# Patient Record
Sex: Female | Born: 1955 | Race: Black or African American | Hispanic: No | Smoking: Current every day smoker
Health system: Southern US, Community
[De-identification: ages and names within clinical notes are randomized; demographics above are authoritative.]

## PROBLEM LIST (undated history)

## (undated) DIAGNOSIS — I2510 Atherosclerotic heart disease of native coronary artery without angina pectoris: Secondary | ICD-10-CM

## (undated) DIAGNOSIS — E162 Hypoglycemia, unspecified: Secondary | ICD-10-CM

## (undated) DIAGNOSIS — F419 Anxiety disorder, unspecified: Secondary | ICD-10-CM

## (undated) DIAGNOSIS — F329 Major depressive disorder, single episode, unspecified: Secondary | ICD-10-CM

## (undated) DIAGNOSIS — K219 Gastro-esophageal reflux disease without esophagitis: Secondary | ICD-10-CM

## (undated) DIAGNOSIS — I10 Essential (primary) hypertension: Secondary | ICD-10-CM

## (undated) DIAGNOSIS — I739 Peripheral vascular disease, unspecified: Secondary | ICD-10-CM

## (undated) DIAGNOSIS — IMO0002 Reserved for concepts with insufficient information to code with codable children: Secondary | ICD-10-CM

## (undated) DIAGNOSIS — I639 Cerebral infarction, unspecified: Secondary | ICD-10-CM

## (undated) DIAGNOSIS — F32A Depression, unspecified: Secondary | ICD-10-CM

## (undated) HISTORY — PX: TUBAL LIGATION: SHX77

## (undated) HISTORY — PX: KNEE SURGERY: SHX244

---

## 2001-10-25 ENCOUNTER — Emergency Department (HOSPITAL_COMMUNITY): Admission: EM | Admit: 2001-10-25 | Discharge: 2001-10-26 | Payer: Self-pay | Admitting: Emergency Medicine

## 2001-10-25 ENCOUNTER — Encounter: Payer: Self-pay | Admitting: Emergency Medicine

## 2002-06-30 ENCOUNTER — Emergency Department (HOSPITAL_COMMUNITY): Admission: EM | Admit: 2002-06-30 | Discharge: 2002-07-01 | Payer: Self-pay | Admitting: Emergency Medicine

## 2003-01-20 ENCOUNTER — Emergency Department (HOSPITAL_COMMUNITY): Admission: AD | Admit: 2003-01-20 | Discharge: 2003-01-20 | Payer: Self-pay | Admitting: Emergency Medicine

## 2004-06-30 ENCOUNTER — Emergency Department (HOSPITAL_COMMUNITY): Admission: EM | Admit: 2004-06-30 | Discharge: 2004-07-01 | Payer: Self-pay | Admitting: Emergency Medicine

## 2004-11-10 ENCOUNTER — Emergency Department (HOSPITAL_COMMUNITY): Admission: EM | Admit: 2004-11-10 | Discharge: 2004-11-11 | Payer: Self-pay | Admitting: Emergency Medicine

## 2005-03-18 ENCOUNTER — Emergency Department (HOSPITAL_COMMUNITY): Admission: EM | Admit: 2005-03-18 | Discharge: 2005-03-18 | Payer: Self-pay | Admitting: Emergency Medicine

## 2005-04-25 ENCOUNTER — Ambulatory Visit: Payer: Self-pay | Admitting: Nurse Practitioner

## 2005-04-29 ENCOUNTER — Ambulatory Visit: Payer: Self-pay | Admitting: *Deleted

## 2005-05-02 ENCOUNTER — Ambulatory Visit (HOSPITAL_COMMUNITY): Admission: RE | Admit: 2005-05-02 | Discharge: 2005-05-02 | Payer: Self-pay | Admitting: Internal Medicine

## 2005-05-14 ENCOUNTER — Ambulatory Visit (HOSPITAL_COMMUNITY): Admission: RE | Admit: 2005-05-14 | Discharge: 2005-05-14 | Payer: Self-pay | Admitting: Internal Medicine

## 2005-06-30 ENCOUNTER — Ambulatory Visit: Payer: Self-pay | Admitting: Nurse Practitioner

## 2005-07-01 ENCOUNTER — Ambulatory Visit: Payer: Self-pay | Admitting: Nurse Practitioner

## 2005-09-30 ENCOUNTER — Ambulatory Visit: Payer: Self-pay | Admitting: Nurse Practitioner

## 2006-05-20 ENCOUNTER — Ambulatory Visit: Payer: Self-pay | Admitting: Nurse Practitioner

## 2006-06-17 ENCOUNTER — Emergency Department (HOSPITAL_COMMUNITY): Admission: EM | Admit: 2006-06-17 | Discharge: 2006-06-17 | Payer: Self-pay | Admitting: Emergency Medicine

## 2006-07-06 ENCOUNTER — Emergency Department (HOSPITAL_COMMUNITY): Admission: EM | Admit: 2006-07-06 | Discharge: 2006-07-06 | Payer: Self-pay | Admitting: *Deleted

## 2006-12-30 ENCOUNTER — Encounter (INDEPENDENT_AMBULATORY_CARE_PROVIDER_SITE_OTHER): Payer: Self-pay | Admitting: *Deleted

## 2007-06-01 ENCOUNTER — Ambulatory Visit: Payer: Self-pay | Admitting: Internal Medicine

## 2007-06-01 ENCOUNTER — Encounter (INDEPENDENT_AMBULATORY_CARE_PROVIDER_SITE_OTHER): Payer: Self-pay | Admitting: Nurse Practitioner

## 2007-06-01 LAB — CONVERTED CEMR LAB
AST: 12 units/L (ref 0–37)
Albumin: 4.2 g/dL (ref 3.5–5.2)
Alkaline Phosphatase: 133 units/L — ABNORMAL HIGH (ref 39–117)
BUN: 16 mg/dL (ref 6–23)
Basophils Relative: 1 % (ref 0–1)
Chloride: 111 meq/L (ref 96–112)
Eosinophils Absolute: 0.3 10*3/uL (ref 0.0–0.7)
Eosinophils Relative: 4 % (ref 0–5)
HCT: 44.3 % (ref 36.0–46.0)
Lymphocytes Relative: 52 % — ABNORMAL HIGH (ref 12–46)
Lymphs Abs: 3.5 10*3/uL (ref 0.7–4.0)
MCHC: 32.3 g/dL (ref 30.0–36.0)
MCV: 88.8 fL (ref 78.0–100.0)
Platelets: 320 10*3/uL (ref 150–400)
RDW: 14.4 % (ref 11.5–15.5)
Sodium: 144 meq/L (ref 135–145)
TSH: 1.333 microintl units/mL (ref 0.350–5.50)
Total Protein: 6.8 g/dL (ref 6.0–8.3)

## 2007-10-28 ENCOUNTER — Emergency Department (HOSPITAL_COMMUNITY): Admission: EM | Admit: 2007-10-28 | Discharge: 2007-10-29 | Payer: Self-pay | Admitting: Emergency Medicine

## 2007-10-29 ENCOUNTER — Inpatient Hospital Stay (HOSPITAL_COMMUNITY): Admission: AD | Admit: 2007-10-29 | Discharge: 2007-11-08 | Payer: Self-pay | Admitting: Psychiatry

## 2007-10-29 ENCOUNTER — Ambulatory Visit: Payer: Self-pay | Admitting: Psychiatry

## 2007-10-29 ENCOUNTER — Emergency Department (HOSPITAL_COMMUNITY): Admission: EM | Admit: 2007-10-29 | Discharge: 2007-10-29 | Payer: Self-pay | Admitting: Emergency Medicine

## 2007-11-12 ENCOUNTER — Encounter (INDEPENDENT_AMBULATORY_CARE_PROVIDER_SITE_OTHER): Payer: Self-pay | Admitting: Family Medicine

## 2007-11-12 ENCOUNTER — Ambulatory Visit: Payer: Self-pay | Admitting: Internal Medicine

## 2007-11-12 LAB — CONVERTED CEMR LAB
Calcium: 8.9 mg/dL (ref 8.4–10.5)
Free T4: 0.97 ng/dL (ref 0.89–1.80)
Potassium: 3.5 meq/L (ref 3.5–5.3)
Vit D, 1,25-Dihydroxy: 10 — ABNORMAL LOW (ref 30–89)

## 2007-11-16 ENCOUNTER — Ambulatory Visit (HOSPITAL_COMMUNITY): Admission: RE | Admit: 2007-11-16 | Discharge: 2007-11-16 | Payer: Self-pay | Admitting: Family Medicine

## 2008-01-04 ENCOUNTER — Ambulatory Visit: Payer: Self-pay | Admitting: Family Medicine

## 2008-01-04 LAB — CONVERTED CEMR LAB
Barbiturate Quant, Ur: NEGATIVE
Cocaine Metabolites: NEGATIVE
Marijuana Metabolite: NEGATIVE
Phencyclidine (PCP): NEGATIVE
Propoxyphene: NEGATIVE

## 2008-01-06 ENCOUNTER — Ambulatory Visit (HOSPITAL_COMMUNITY): Admission: RE | Admit: 2008-01-06 | Discharge: 2008-01-06 | Payer: Self-pay | Admitting: Family Medicine

## 2008-04-10 ENCOUNTER — Ambulatory Visit: Payer: Self-pay | Admitting: Internal Medicine

## 2008-04-10 LAB — CONVERTED CEMR LAB
BUN: 11 mg/dL
CO2: 22 meq/L
Calcium: 8.9 mg/dL
Chloride: 94 meq/L — ABNORMAL LOW
Creatinine, Ser: 0.98 mg/dL
Glucose, Bld: 534 mg/dL
Potassium: 4.1 meq/L
Sodium: 136 meq/L

## 2008-04-11 ENCOUNTER — Ambulatory Visit: Payer: Self-pay | Admitting: Family Medicine

## 2008-04-12 ENCOUNTER — Ambulatory Visit (HOSPITAL_COMMUNITY): Admission: RE | Admit: 2008-04-12 | Discharge: 2008-04-12 | Payer: Self-pay | Admitting: Internal Medicine

## 2008-04-13 ENCOUNTER — Emergency Department (HOSPITAL_COMMUNITY): Admission: EM | Admit: 2008-04-13 | Discharge: 2008-04-13 | Payer: Self-pay | Admitting: Family Medicine

## 2008-05-02 ENCOUNTER — Ambulatory Visit: Payer: Self-pay | Admitting: Family Medicine

## 2008-05-03 ENCOUNTER — Ambulatory Visit: Payer: Self-pay | Admitting: Family Medicine

## 2008-06-06 ENCOUNTER — Ambulatory Visit: Payer: Self-pay | Admitting: Family Medicine

## 2008-09-07 ENCOUNTER — Ambulatory Visit: Payer: Self-pay | Admitting: Family Medicine

## 2008-11-30 ENCOUNTER — Ambulatory Visit: Payer: Self-pay | Admitting: Family Medicine

## 2009-01-16 ENCOUNTER — Ambulatory Visit (HOSPITAL_COMMUNITY): Admission: RE | Admit: 2009-01-16 | Discharge: 2009-01-16 | Payer: Self-pay | Admitting: Family Medicine

## 2009-02-07 ENCOUNTER — Telehealth (INDEPENDENT_AMBULATORY_CARE_PROVIDER_SITE_OTHER): Payer: Self-pay | Admitting: *Deleted

## 2009-02-13 ENCOUNTER — Ambulatory Visit: Payer: Self-pay | Admitting: Family Medicine

## 2009-02-20 ENCOUNTER — Ambulatory Visit: Payer: Self-pay | Admitting: Internal Medicine

## 2009-05-08 ENCOUNTER — Ambulatory Visit: Payer: Self-pay | Admitting: Family Medicine

## 2009-05-08 LAB — CONVERTED CEMR LAB
ALT: 20 units/L (ref 0–35)
Albumin: 3.8 g/dL (ref 3.5–5.2)
Alkaline Phosphatase: 114 units/L (ref 39–117)
CO2: 27 meq/L (ref 19–32)
Calcium: 9.4 mg/dL (ref 8.4–10.5)
Chloride: 104 meq/L (ref 96–112)
Eosinophils Relative: 2 % (ref 0–5)
HCT: 42.7 % (ref 36.0–46.0)
Hemoglobin: 13.5 g/dL (ref 12.0–15.0)
Lymphocytes Relative: 52 % — ABNORMAL HIGH (ref 12–46)
Lymphs Abs: 4.3 10*3/uL — ABNORMAL HIGH (ref 0.7–4.0)
MCV: 89.3 fL (ref 78.0–100.0)
Monocytes Relative: 7 % (ref 3–12)
Neutro Abs: 3.3 10*3/uL (ref 1.7–7.7)
Platelets: 320 10*3/uL (ref 150–400)
Potassium: 3.7 meq/L (ref 3.5–5.3)
RDW: 13.1 % (ref 11.5–15.5)
Total Bilirubin: 0.3 mg/dL (ref 0.3–1.2)
Total Protein: 5.9 g/dL — ABNORMAL LOW (ref 6.0–8.3)

## 2009-05-22 ENCOUNTER — Ambulatory Visit: Payer: Self-pay | Admitting: Family Medicine

## 2009-05-28 ENCOUNTER — Ambulatory Visit (HOSPITAL_COMMUNITY): Admission: RE | Admit: 2009-05-28 | Discharge: 2009-05-28 | Payer: Self-pay | Admitting: Family Medicine

## 2009-06-05 ENCOUNTER — Ambulatory Visit: Payer: Self-pay | Admitting: Family Medicine

## 2009-08-21 ENCOUNTER — Emergency Department (HOSPITAL_COMMUNITY): Admission: EM | Admit: 2009-08-21 | Discharge: 2009-08-21 | Payer: Self-pay | Admitting: Emergency Medicine

## 2009-09-04 ENCOUNTER — Ambulatory Visit: Payer: Self-pay | Admitting: Family Medicine

## 2009-09-18 ENCOUNTER — Ambulatory Visit: Payer: Self-pay | Admitting: Family Medicine

## 2009-12-12 ENCOUNTER — Other Ambulatory Visit: Admission: RE | Admit: 2009-12-12 | Discharge: 2009-12-12 | Payer: Self-pay | Admitting: Obstetrics & Gynecology

## 2009-12-12 ENCOUNTER — Ambulatory Visit: Payer: Self-pay | Admitting: Obstetrics and Gynecology

## 2010-05-05 ENCOUNTER — Encounter: Payer: Self-pay | Admitting: Internal Medicine

## 2010-05-21 ENCOUNTER — Encounter (INDEPENDENT_AMBULATORY_CARE_PROVIDER_SITE_OTHER): Payer: Self-pay | Admitting: Family Medicine

## 2010-05-21 LAB — CONVERTED CEMR LAB
AST: 12 units/L (ref 0–37)
Albumin: 4.2 g/dL (ref 3.5–5.2)
BUN: 9 mg/dL (ref 6–23)
CO2: 26 meq/L (ref 19–32)
Cholesterol: 239 mg/dL — ABNORMAL HIGH (ref 0–200)
HDL: 49 mg/dL (ref >39)
Microalb, Ur: 1.34 mg/dL (ref 0.00–1.89)
Potassium: 4.3 meq/L (ref 3.5–5.3)
TSH: 0.772 microintl units/mL (ref 0.350–4.500)
Total CHOL/HDL Ratio: 4.9
Triglycerides: 78 mg/dL (ref <150)

## 2010-05-28 ENCOUNTER — Emergency Department (HOSPITAL_COMMUNITY)
Admission: EM | Admit: 2010-05-28 | Discharge: 2010-05-28 | Disposition: A | Discharge status: Home / Self Care | Payer: Medicaid Other | Attending: Emergency Medicine | Admitting: Emergency Medicine

## 2010-05-28 DIAGNOSIS — I10 Essential (primary) hypertension: Secondary | ICD-10-CM | POA: Insufficient documentation

## 2010-05-28 DIAGNOSIS — W268XXA Contact with other sharp object(s), not elsewhere classified, initial encounter: Secondary | ICD-10-CM | POA: Insufficient documentation

## 2010-05-28 DIAGNOSIS — Z794 Long term (current) use of insulin: Secondary | ICD-10-CM | POA: Insufficient documentation

## 2010-05-28 DIAGNOSIS — S61209A Unspecified open wound of unspecified finger without damage to nail, initial encounter: Secondary | ICD-10-CM | POA: Insufficient documentation

## 2010-05-28 DIAGNOSIS — E119 Type 2 diabetes mellitus without complications: Secondary | ICD-10-CM | POA: Insufficient documentation

## 2010-06-17 ENCOUNTER — Ambulatory Visit: Payer: Medicaid Other | Admitting: *Deleted

## 2010-07-02 LAB — URINALYSIS, ROUTINE W REFLEX MICROSCOPIC
Leukocytes, UA: NEGATIVE
Nitrite: NEGATIVE
Protein, ur: NEGATIVE mg/dL
Urobilinogen, UA: 1 mg/dL (ref 0.0–1.0)

## 2010-07-02 LAB — URINE MICROSCOPIC-ADD ON

## 2010-07-02 LAB — GC/CHLAMYDIA PROBE AMP, GENITAL: Chlamydia, DNA Probe: NEGATIVE

## 2010-07-02 LAB — WET PREP, GENITAL: Yeast Wet Prep HPF POC: NONE SEEN
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
## 2010-08-30 NOTE — Discharge Summary (Signed)
Miranda [name], Miranda [name]              ACCOUNT NO.:  [id number]   MEDICAL RECORD NO.:  [id number]          PATIENT TYPE:  IPS   LOCATION:  0305                          FACILITY:  [facility]   PHYSICIAN:  Geoffery Lyons, M.D.      DATE OF BIRTH:  Feb 19, 1956   DATE OF ADMISSION:  10/29/2007  DATE OF DISCHARGE:  11/08/2007                               DISCHARGE SUMMARY   CHIEF COMPLAINT/HISTORY OF PRESENT ILLNESS:  This was the first  admission to [facility] for this 55 year old separated  African American female who presented to the emergency room October 29, 2007 complaining of leg pain.  She was released.  Stayed in the  emergency department 2 hours after having been released.  At that point,  she decided that she was suicidal.  Endorsed she had a plan to run into  traffic.  She was ashamed that she had relapsed on cocaine, feeling  hopeless, depressed.  Endorsed that she did not want to live anymore.  She relapsed on cocaine and alcohol a few days before this admission.   PAST MEDICAL HISTORY:  1. Was in [facility] 5 years prior to this admission.  She      has already been  RCA.  She has also been in other drug rehab, she      cannot remember.  2. Hypertension.  3. Nerve pain in her legs.  4. Baker's cyst.   ALCOHOL/DRUG HISTORY:  She had been abusing crack and cocaine since her  teens.   MEDICATIONS:  1. Lisinopril 10 mg per day.  2. Protonix 40 mg per day.  3. Colace 100 mg twice a day.  4. Ibuprofen 600 mg every 6 hours as needed.  5. Wellbutrin XL 150 mg per day.   LABORATORY WORK:  Results not available in the chart.   PHYSICAL EXAMINATION:  Exam failed to show any acute findings.   MENTAL STATUS EXAM:  Exam reveals alert, cooperative female casually  groomed and dressed.  Speech was soft,  but normal rate, tempo and  production.  Mood depressed.  Affect depressed.  Thought processes are  clear, rational and goal oriented.  No active suicidal or  homicidal  ideas.  No delusions.  No hallucinations.  Cognition well preserved.   ADMISSION DIAGNOSES:  AXIS I:  Depressive disorder, not otherwise  specified.  Alcohol and cocaine abuse, rule out dependence.  AXIS II:  No diagnosis.  AXIS III:  Hypertension.  Neuropathic pain.  Baker's cyst.  AXIS IV:  Moderate.  AXIS V:  On admission 35, global assessment of functioning of 60.   COURSE IN THE HOSPITAL:  She was admitted, started individual and group  psychotherapy.  She was maintained on her medications.  She was placed  on Wellbutrin as well as Neurontin.  Through most of her stay, she was  somatically focused, feeling weak, complaining of pain.  Mood depressed.  Affect depressed.  On November 04, 2007, multiple somatic complaints.  The  joint pain was getting worse, leg pain, Baker's cyst.  Endorsed that she  has a needle  in her bone and burning in her feet.  Was wanting to get  back on Neurontin.  We went ahead and started Neurontin 100 three times  a day.  Wanting to go into a residential treatment program.  We were  trying to identify resources.  We pursued the detox.  We worked on  relapse prevention and identifying any possible places that she could go  to.  She was continued to be again somatically focused, ruminating about  her blood pressure, where to go from the hospital.  Wanting to ensure  that she had the structure that she needed to stay abstinent.  She was  committed to getting her life back together.  Medications were adjusted.  By November 05, 2007, her blood pressure was better.  We adjusted the  trazodone for sleep.  She was motivated to stay abstinent.  In the next  24 hours, she continued to improve.  So on November 08, 2007, she was in  full contact with reality.  There were no active suicidal or homicidal  ideas, no hallucinations, no delusions.  Objectively better.  She had  been seen interacting appropriately in the unit.  Last seen sharing with  other patients and she  was going to outpatient follow up.  She was  encouraged, endorsing overall that she was better.  Committed to  abstinence.   DISCHARGE DIAGNOSES:  AXIS I:  Depressive disorder, not otherwise  specified.  Alcohol and cocaine abuse.  AXIS II:  No diagnosis.  AXIS III:  Neuropathy.  Hypertension.  Baker's cyst.  AXIS IV:  Moderate.  AXIS V:  Upon discharge 50-55.   DISCHARGE MEDICATIONS:  1. Colace 100 mg twice a day.  2. Pepcid 20 mg per day.  3. Wellbutrin XL 100 mg twice a day.  4. Neurontin 100 mg 3 times a day.  5. Hydrochlorothiazide 25 mg per day.  6. Clonidine 0.1 one-half tablet daily.  7. Norvasc 5 mg per day.  8. Trazodone 100 mg at night.   FOLLOW UP:  Follow up at the [facility].      Geoffery Lyons, M.D.  Electronically Signed     IL/MEDQ  D:  12/07/2007  T:  12/08/2007  Job:  [id number]

## 2010-10-09 ENCOUNTER — Emergency Department (HOSPITAL_COMMUNITY): Payer: Medicaid Other

## 2010-10-09 ENCOUNTER — Emergency Department (HOSPITAL_COMMUNITY)
Admission: EM | Admit: 2010-10-09 | Discharge: 2010-10-09 | Disposition: A | Discharge status: Home / Self Care | Payer: Medicaid Other | Attending: Emergency Medicine | Admitting: Emergency Medicine

## 2010-10-09 DIAGNOSIS — I10 Essential (primary) hypertension: Secondary | ICD-10-CM | POA: Insufficient documentation

## 2010-10-09 DIAGNOSIS — R0789 Other chest pain: Secondary | ICD-10-CM | POA: Insufficient documentation

## 2010-10-09 DIAGNOSIS — E119 Type 2 diabetes mellitus without complications: Secondary | ICD-10-CM | POA: Insufficient documentation

## 2010-10-09 DIAGNOSIS — R05 Cough: Secondary | ICD-10-CM | POA: Insufficient documentation

## 2010-10-09 DIAGNOSIS — I498 Other specified cardiac arrhythmias: Secondary | ICD-10-CM | POA: Insufficient documentation

## 2010-10-09 DIAGNOSIS — R059 Cough, unspecified: Secondary | ICD-10-CM | POA: Insufficient documentation

## 2010-10-09 DIAGNOSIS — J189 Pneumonia, unspecified organism: Secondary | ICD-10-CM | POA: Insufficient documentation

## 2010-10-09 DIAGNOSIS — Z794 Long term (current) use of insulin: Secondary | ICD-10-CM | POA: Insufficient documentation

## 2010-10-09 LAB — DIFFERENTIAL
Basophils Absolute: 0 10*3/uL (ref 0.0–0.1)
Eosinophils Absolute: 0.2 10*3/uL (ref 0.0–0.7)
Eosinophils Relative: 3 % (ref 0–5)
Monocytes Absolute: 0.5 10*3/uL (ref 0.1–1.0)
Neutro Abs: 3.2 10*3/uL (ref 1.7–7.7)

## 2010-10-09 LAB — BASIC METABOLIC PANEL
BUN: 15 mg/dL (ref 6–23)
CO2: 28 mEq/L (ref 19–32)
Calcium: 9.5 mg/dL (ref 8.4–10.5)
Chloride: 98 mEq/L (ref 96–112)
GFR calc Af Amer: >60 mL/min (ref >60)
Glucose, Bld: 326 mg/dL — ABNORMAL HIGH (ref 70–99)
Potassium: 3 mEq/L — ABNORMAL LOW (ref 3.5–5.1)
Sodium: 138 mEq/L (ref 135–145)

## 2010-10-09 LAB — GLUCOSE, CAPILLARY

## 2010-10-09 LAB — D-DIMER, QUANTITATIVE: D-Dimer, Quant: <0.22 ug/mL-FEU (ref 0.00–0.48)

## 2010-10-09 LAB — CBC
Hemoglobin: 14.2 g/dL (ref 12.0–15.0)
RDW: 12.9 % (ref 11.5–15.5)

## 2010-10-14 ENCOUNTER — Ambulatory Visit (INDEPENDENT_AMBULATORY_CARE_PROVIDER_SITE_OTHER): Payer: Medicaid Other

## 2010-10-14 ENCOUNTER — Inpatient Hospital Stay (INDEPENDENT_AMBULATORY_CARE_PROVIDER_SITE_OTHER)
Admission: RE | Admit: 2010-10-14 | Discharge: 2010-10-14 | Disposition: A | Discharge status: Home / Self Care | Payer: Medicaid Other | Source: Ambulatory Visit | Attending: Family Medicine | Admitting: Family Medicine

## 2010-10-14 DIAGNOSIS — M549 Dorsalgia, unspecified: Secondary | ICD-10-CM

## 2010-10-14 DIAGNOSIS — J189 Pneumonia, unspecified organism: Secondary | ICD-10-CM

## 2010-10-14 LAB — GLUCOSE, CAPILLARY

## 2010-10-29 ENCOUNTER — Emergency Department (HOSPITAL_COMMUNITY)
Admission: EM | Admit: 2010-10-29 | Discharge: 2010-10-29 | Disposition: A | Discharge status: Home / Self Care | Payer: Medicaid Other | Attending: Emergency Medicine | Admitting: Emergency Medicine

## 2010-10-29 DIAGNOSIS — IMO0002 Reserved for concepts with insufficient information to code with codable children: Secondary | ICD-10-CM | POA: Insufficient documentation

## 2010-10-29 DIAGNOSIS — X58XXXA Exposure to other specified factors, initial encounter: Secondary | ICD-10-CM | POA: Insufficient documentation

## 2010-10-29 DIAGNOSIS — J40 Bronchitis, not specified as acute or chronic: Secondary | ICD-10-CM | POA: Insufficient documentation

## 2010-10-29 DIAGNOSIS — I10 Essential (primary) hypertension: Secondary | ICD-10-CM | POA: Insufficient documentation

## 2010-10-29 DIAGNOSIS — E119 Type 2 diabetes mellitus without complications: Secondary | ICD-10-CM | POA: Insufficient documentation

## 2010-10-29 LAB — POCT I-STAT, CHEM 8
Calcium, Ion: 1.22 mmol/L (ref 1.12–1.32)
Chloride: 105 mEq/L (ref 96–112)
HCT: 44 % (ref 36.0–46.0)
Hemoglobin: 15 g/dL (ref 12.0–15.0)
Potassium: 3.3 mEq/L — ABNORMAL LOW (ref 3.5–5.1)

## 2010-10-30 LAB — GLUCOSE, CAPILLARY: Glucose-Capillary: 163 mg/dL — ABNORMAL HIGH (ref 70–99)

## 2010-11-01 ENCOUNTER — Inpatient Hospital Stay (INDEPENDENT_AMBULATORY_CARE_PROVIDER_SITE_OTHER)
Admission: RE | Admit: 2010-11-01 | Discharge: 2010-11-01 | Disposition: A | Discharge status: Home / Self Care | Payer: Medicaid Other | Source: Ambulatory Visit | Attending: Family Medicine | Admitting: Family Medicine

## 2010-11-01 DIAGNOSIS — M1990 Unspecified osteoarthritis, unspecified site: Secondary | ICD-10-CM

## 2010-12-12 ENCOUNTER — Inpatient Hospital Stay (INDEPENDENT_AMBULATORY_CARE_PROVIDER_SITE_OTHER)
Admission: RE | Admit: 2010-12-12 | Discharge: 2010-12-12 | Disposition: A | Discharge status: Home / Self Care | Payer: Medicaid Other | Source: Ambulatory Visit | Attending: Family Medicine | Admitting: Family Medicine

## 2010-12-12 DIAGNOSIS — S0510XA Contusion of eyeball and orbital tissues, unspecified eye, initial encounter: Secondary | ICD-10-CM

## 2010-12-31 ENCOUNTER — Inpatient Hospital Stay (HOSPITAL_COMMUNITY): Admission: RE | Admit: 2010-12-31 | Payer: Medicaid Other | Source: Ambulatory Visit

## 2011-01-10 LAB — POCT I-STAT, CHEM 8
BUN: 30 — ABNORMAL HIGH
Calcium, Ion: 1.15
Chloride: 108
Creatinine, Ser: 2.2 — ABNORMAL HIGH
Glucose, Bld: 94
HCT: 40
Hemoglobin: 13.6
Potassium: 3.4 — ABNORMAL LOW
Sodium: 144
TCO2: 28

## 2011-01-10 LAB — POCT PREGNANCY, URINE
Operator id: 22937
Preg Test, Ur: NEGATIVE

## 2011-01-10 LAB — CBC
HCT: 39.4
Hemoglobin: 12.9
MCHC: 32.9
MCV: 86.7
RBC: 4.54
RDW: 12.9

## 2011-01-10 LAB — URINALYSIS, ROUTINE W REFLEX MICROSCOPIC
Hgb urine dipstick: NEGATIVE
Protein, ur: 30 — AB
Urobilinogen, UA: 1

## 2011-01-10 LAB — DIFFERENTIAL
Basophils Absolute: 0.1
Basophils Relative: 1
Eosinophils Relative: 2
Monocytes Absolute: 0.8
Monocytes Relative: 8

## 2011-01-10 LAB — RAPID URINE DRUG SCREEN, HOSP PERFORMED
Barbiturates: NOT DETECTED
Cocaine: POSITIVE — AB
Opiates: NOT DETECTED

## 2011-01-10 LAB — URINE MICROSCOPIC-ADD ON

## 2011-01-16 LAB — POCT I-STAT, CHEM 8
BUN: 10 mg/dL (ref 6–23)
Calcium, Ion: 1.1 mmol/L — ABNORMAL LOW (ref 1.12–1.32)
Creatinine, Ser: 0.8 mg/dL (ref 0.4–1.2)
Hemoglobin: 15 g/dL (ref 12.0–15.0)
Sodium: 136 mEq/L (ref 135–145)
TCO2: 25 mmol/L (ref 0–100)

## 2011-05-12 ENCOUNTER — Other Ambulatory Visit: Payer: Self-pay

## 2011-05-12 ENCOUNTER — Emergency Department (HOSPITAL_COMMUNITY): Payer: Medicaid Other

## 2011-05-12 ENCOUNTER — Encounter (HOSPITAL_COMMUNITY): Payer: Self-pay | Admitting: Emergency Medicine

## 2011-05-12 ENCOUNTER — Emergency Department (HOSPITAL_COMMUNITY)
Admission: EM | Admit: 2011-05-12 | Discharge: 2011-05-12 | Disposition: A | Discharge status: Home / Self Care | Payer: Medicaid Other | Attending: Emergency Medicine | Admitting: Emergency Medicine

## 2011-05-12 DIAGNOSIS — K219 Gastro-esophageal reflux disease without esophagitis: Secondary | ICD-10-CM | POA: Insufficient documentation

## 2011-05-12 DIAGNOSIS — J3489 Other specified disorders of nose and nasal sinuses: Secondary | ICD-10-CM | POA: Insufficient documentation

## 2011-05-12 DIAGNOSIS — IMO0001 Reserved for inherently not codable concepts without codable children: Secondary | ICD-10-CM | POA: Insufficient documentation

## 2011-05-12 DIAGNOSIS — Z79899 Other long term (current) drug therapy: Secondary | ICD-10-CM | POA: Insufficient documentation

## 2011-05-12 DIAGNOSIS — R070 Pain in throat: Secondary | ICD-10-CM | POA: Insufficient documentation

## 2011-05-12 DIAGNOSIS — F329 Major depressive disorder, single episode, unspecified: Secondary | ICD-10-CM | POA: Insufficient documentation

## 2011-05-12 DIAGNOSIS — R05 Cough: Secondary | ICD-10-CM | POA: Insufficient documentation

## 2011-05-12 DIAGNOSIS — R0602 Shortness of breath: Secondary | ICD-10-CM | POA: Insufficient documentation

## 2011-05-12 DIAGNOSIS — J45909 Unspecified asthma, uncomplicated: Secondary | ICD-10-CM | POA: Insufficient documentation

## 2011-05-12 DIAGNOSIS — E119 Type 2 diabetes mellitus without complications: Secondary | ICD-10-CM | POA: Insufficient documentation

## 2011-05-12 DIAGNOSIS — J40 Bronchitis, not specified as acute or chronic: Secondary | ICD-10-CM | POA: Insufficient documentation

## 2011-05-12 DIAGNOSIS — M542 Cervicalgia: Secondary | ICD-10-CM | POA: Insufficient documentation

## 2011-05-12 DIAGNOSIS — R109 Unspecified abdominal pain: Secondary | ICD-10-CM | POA: Insufficient documentation

## 2011-05-12 DIAGNOSIS — I10 Essential (primary) hypertension: Secondary | ICD-10-CM | POA: Insufficient documentation

## 2011-05-12 DIAGNOSIS — R079 Chest pain, unspecified: Secondary | ICD-10-CM | POA: Insufficient documentation

## 2011-05-12 DIAGNOSIS — R059 Cough, unspecified: Secondary | ICD-10-CM | POA: Insufficient documentation

## 2011-05-12 DIAGNOSIS — R5381 Other malaise: Secondary | ICD-10-CM | POA: Insufficient documentation

## 2011-05-12 DIAGNOSIS — Z794 Long term (current) use of insulin: Secondary | ICD-10-CM | POA: Insufficient documentation

## 2011-05-12 DIAGNOSIS — R509 Fever, unspecified: Secondary | ICD-10-CM | POA: Insufficient documentation

## 2011-05-12 DIAGNOSIS — R112 Nausea with vomiting, unspecified: Secondary | ICD-10-CM | POA: Insufficient documentation

## 2011-05-12 DIAGNOSIS — F3289 Other specified depressive episodes: Secondary | ICD-10-CM | POA: Insufficient documentation

## 2011-05-12 HISTORY — DX: Depression, unspecified: F32.A

## 2011-05-12 HISTORY — DX: Essential (primary) hypertension: I10

## 2011-05-12 HISTORY — DX: Major depressive disorder, single episode, unspecified: F32.9

## 2011-05-12 HISTORY — DX: Gastro-esophageal reflux disease without esophagitis: K21.9

## 2011-05-12 LAB — CBC
MCH: 27.9 pg (ref 26.0–34.0)
MCV: 86.3 fL (ref 78.0–100.0)
Platelets: 366 10*3/uL (ref 150–400)
RBC: 4.76 MIL/uL (ref 3.87–5.11)
RDW: 13 % (ref 11.5–15.5)
WBC: 7.5 10*3/uL (ref 4.0–10.5)

## 2011-05-12 LAB — COMPREHENSIVE METABOLIC PANEL
ALT: 9 U/L (ref 0–35)
AST: 7 U/L (ref 0–37)
Albumin: 3.6 g/dL (ref 3.5–5.2)
CO2: 29 mEq/L (ref 19–32)
Calcium: 9.6 mg/dL (ref 8.4–10.5)
Creatinine, Ser: 0.54 mg/dL (ref 0.50–1.10)
GFR calc non Af Amer: >90 mL/min (ref >90)
Sodium: 143 mEq/L (ref 135–145)

## 2011-05-12 LAB — POCT I-STAT TROPONIN I: Troponin i, poc: 0 ng/mL (ref 0.00–0.08)

## 2011-05-12 MED ORDER — OXYCODONE-ACETAMINOPHEN 5-325 MG PO TABS
1.0000 | ORAL_TABLET | Freq: Once | ORAL | Status: AC
  Administered 2011-05-12: 1 via ORAL
  Filled 2011-05-12: qty 1

## 2011-05-12 MED ORDER — PREDNISONE 10 MG PO TABS: 20.0000 mg | ORAL_TABLET | Freq: Two times a day (BID) | ORAL | Status: DC

## 2011-05-12 MED ORDER — HYDROMORPHONE HCL PF 1 MG/ML IJ SOLN
INTRAMUSCULAR | Status: AC
  Filled 2011-05-12: qty 1

## 2011-05-12 MED ORDER — ALBUTEROL SULFATE HFA 108 (90 BASE) MCG/ACT IN AERS
2.0000 | INHALATION_SPRAY | Freq: Once | RESPIRATORY_TRACT | Status: AC
  Administered 2011-05-12: 2 via RESPIRATORY_TRACT
  Filled 2011-05-12: qty 6.7

## 2011-05-12 MED ORDER — HYDROCOD POLST-CHLORPHEN POLST 10-8 MG/5ML PO LQCR: 5.0000 mL | Freq: Every evening | ORAL | Status: DC | PRN

## 2011-05-12 MED ORDER — IPRATROPIUM BROMIDE 0.02 % IN SOLN
0.5000 mg | Freq: Once | RESPIRATORY_TRACT | Status: AC
  Administered 2011-05-12: 0.5 mg via RESPIRATORY_TRACT
  Filled 2011-05-12: qty 2.5

## 2011-05-12 MED ORDER — OXYCODONE HCL 10 MG PO TABS: 10.0000 mg | ORAL_TABLET | Freq: Four times a day (QID) | ORAL | Status: DC | PRN

## 2011-05-12 MED ORDER — ALBUTEROL SULFATE (5 MG/ML) 0.5% IN NEBU
2.5000 mg | INHALATION_SOLUTION | Freq: Once | RESPIRATORY_TRACT | Status: AC
  Administered 2011-05-12: 2.5 mg via RESPIRATORY_TRACT
  Filled 2011-05-12 (×2): qty 0.5

## 2011-05-12 MED ORDER — PREDNISONE 20 MG PO TABS
60.0000 mg | ORAL_TABLET | Freq: Once | ORAL | Status: AC
  Administered 2011-05-12: 60 mg via ORAL
  Filled 2011-05-12: qty 3

## 2011-05-12 NOTE — ED Notes (Signed)
Printed two old ekgs from [facility]. 

## 2011-05-12 NOTE — ED Notes (Signed)
Cp x 1 week and cough  Also having back pain nauseateed and vomitring

## 2011-05-12 NOTE — ED Provider Notes (Signed)
History     CSN: [id number]  Arrival date & time 05/12/11  1237   First MD Initiated Contact with Patient 05/12/11 1300      Chief Complaint  Patient presents with  . Chest Pain    (Consider location/radiation/quality/duration/timing/severity/associated sxs/prior treatment) HPI History provided by pt.   Pt has had a productive cough for approx 4 weeks.  In the past two weeks she has developed exertional SOB.  Has soreness in lateral ribs when she coughs but otherwise no CP.  Also associated w/ subjective fever, sinus pressure, nasal congestion, sore throat, body aches, fatigue, generalized weakness and intermittent N/V.  Known sick contact.  Pt has taken medications for asthma in the past.  She smokes cigarettes.  No h/o CAD.    Past Medical History  Diagnosis Date  . Hypertension   . Diabetes mellitus   . Asthma   . Depression   . Acid reflux     History reviewed. No pertinent past surgical history.  No family history on file.  History  Substance Use Topics  . Smoking status: Current Everyday Smoker  . Smokeless tobacco: Not on file  . Alcohol Use: No    OB History    Grav Para Term Preterm Abortions TAB SAB Ect Mult Living                  Review of Systems  All other systems reviewed and are negative.    Allergies  Review of patient's allergies indicates no known allergies.  Home Medications   Current Outpatient Rx  Name Route Sig Dispense Refill  . AMLODIPINE BESYLATE 10 MG PO TABS Oral Take 10 mg by mouth daily.    [name] CLONIDINE HCL 0.1 MG PO TABS Oral Take 0.1 mg by mouth 2 (two) times daily.    . CYCLOBENZAPRINE HCL 10 MG PO TABS Oral Take 10 mg by mouth 3 (three) times daily.    [name] GABAPENTIN 300 MG PO CAPS Oral Take 600 mg by mouth 3 (three) times daily.    [name] HYDROCHLOROTHIAZIDE 25 MG PO TABS Oral Take 25 mg by mouth daily.    . INSULIN GLARGINE 100 UNIT/ML [place] SOLN Subcutaneous Inject 40 Units into the skin 2 (two) times daily.    [name] LISINOPRIL 20 MG  PO TABS Oral Take 20 mg by mouth daily.    [name] METFORMIN HCL ER (MOD) 1000 MG PO TB24 Oral Take 1,000 mg by mouth 2 (two) times daily with a meal.    . OXYMORPHONE HCL 10 MG PO TABS Oral Take 20 mg by mouth 2 (two) times daily.    . SULINDAC 200 MG PO TABS Oral Take 200 mg by mouth 2 (two) times daily.    . TRAZODONE HCL 50 MG PO TABS Oral Take 50 mg by mouth at bedtime.      BP 137/80  Pulse 82  Temp(Src) 98.9 F (37.2 C) (Oral)  Resp 15  SpO2 97%  Physical Exam  Nursing note and vitals reviewed. Constitutional: She is oriented to person, place, and time. She appears well-developed and well-nourished. No distress.  HENT:  Head: Normocephalic and atraumatic.  Eyes:       Normal appearance  Neck: Normal range of motion.       Possible cervical adenopathy; anterior neck ttp  Cardiovascular: Normal rate and regular rhythm.   Pulmonary/Chest: Effort normal and breath sounds normal. No respiratory distress. She has no wheezes.  Abdominal: Soft. Bowel sounds are normal. She exhibits  no distension.       Diffuse, mild ttp  Neurological: She is alert and oriented to person, place, and time.  Skin: Skin is warm and dry. No rash noted.  Psychiatric: She has a normal mood and affect. Her behavior is normal.    ED Course  Procedures (including critical care time)   Date: 05/12/2011  Rate: 93  Rhythm: normal sinus rhythm  QRS Axis: normal  Intervals: normal  ST/T Wave abnormalities: normal  Conduction Disutrbances:none  Narrative Interpretation:   Old EKG Reviewed: unchanged   Labs Reviewed  COMPREHENSIVE METABOLIC PANEL - Abnormal; Notable for the following:    Glucose, Bld 191 (*)    Alkaline Phosphatase 143 (*)    Total Bilirubin 0.2 (*)    All other components within normal limits  CBC  POCT I-STAT TROPONIN I   Dg Chest 2 View  05/12/2011  *RADIOLOGY REPORT*  Clinical Data: 56 year old female with chest pain.  CHEST - 2 VIEW  Comparison: 10/14/2010  Findings: The  cardiomediastinal silhouette is unremarkable. Mild peribronchial thickening is stable. There is no evidence of focal airspace disease, pulmonary edema, pulmonary nodule/mass, pleural effusion, or pneumothorax. No acute bony abnormalities are identified.  IMPRESSION: No evidence of acute cardiopulmonary disease.  Mild chronic peribronchial thickening.  Original Report Authenticated By: Rosendo Gros, M.D.     1. Bronchitis       MDM  Pt w/ h/o untreated asthma presents w/ c/o cough and exertional SOB for several weeks.  Also c/o acute on chronic low back pain secondary to coughing and running out of opana.  Afebrile, no respiratory distress or coughing, lungs CTA on exam.  EKG non-ischemic.  Labs ordered in triage and unremarkable.  CXR shows mild, chronic peribronchial thickening.  Will treat for bronchitis vs. Asthma.  Pt received a breathing tmnt as well as prednisone and percocet in ED which some relief of sx.  She received an albuterol inhaler.  Will d/c home w/ short course of oxycodone, prednisone and tussionex.  Referred back to PCP for persistent sx and return precautions discussed.         Arie Sabina [place], [place] 05/12/11 [phone]

## 2011-05-14 NOTE — ED Provider Notes (Signed)
Medical screening examination/treatment/procedure(s) were performed by non-physician practitioner and as supervising physician I was immediately available for consultation/collaboration.  CXR, ECG reviewed by me (abnormal - bronchitic changes, no acute changes - respectively.)    Gerhard Munch, MD 05/14/11 [phone]

## 2011-07-14 ENCOUNTER — Encounter (HOSPITAL_COMMUNITY): Payer: Self-pay

## 2011-07-14 ENCOUNTER — Encounter (HOSPITAL_COMMUNITY): Payer: Self-pay | Admitting: *Deleted

## 2011-07-14 ENCOUNTER — Emergency Department (INDEPENDENT_AMBULATORY_CARE_PROVIDER_SITE_OTHER)
Admission: EM | Admit: 2011-07-14 | Discharge: 2011-07-14 | Disposition: A | Discharge status: Home / Self Care | Payer: Medicaid Other | Source: Home / Self Care | Attending: Family Medicine | Admitting: Family Medicine

## 2011-07-14 ENCOUNTER — Emergency Department (HOSPITAL_COMMUNITY)
Admission: EM | Admit: 2011-07-14 | Discharge: 2011-07-14 | Disposition: A | Discharge status: Home / Self Care | Payer: Medicaid Other | Attending: Emergency Medicine | Admitting: Emergency Medicine

## 2011-07-14 DIAGNOSIS — M549 Dorsalgia, unspecified: Secondary | ICD-10-CM

## 2011-07-14 DIAGNOSIS — G8929 Other chronic pain: Secondary | ICD-10-CM | POA: Insufficient documentation

## 2011-07-14 DIAGNOSIS — J45909 Unspecified asthma, uncomplicated: Secondary | ICD-10-CM | POA: Insufficient documentation

## 2011-07-14 DIAGNOSIS — Z79899 Other long term (current) drug therapy: Secondary | ICD-10-CM | POA: Insufficient documentation

## 2011-07-14 DIAGNOSIS — K219 Gastro-esophageal reflux disease without esophagitis: Secondary | ICD-10-CM | POA: Insufficient documentation

## 2011-07-14 DIAGNOSIS — E119 Type 2 diabetes mellitus without complications: Secondary | ICD-10-CM | POA: Insufficient documentation

## 2011-07-14 DIAGNOSIS — I10 Essential (primary) hypertension: Secondary | ICD-10-CM | POA: Insufficient documentation

## 2011-07-14 DIAGNOSIS — F3289 Other specified depressive episodes: Secondary | ICD-10-CM | POA: Insufficient documentation

## 2011-07-14 DIAGNOSIS — F329 Major depressive disorder, single episode, unspecified: Secondary | ICD-10-CM | POA: Insufficient documentation

## 2011-07-14 DIAGNOSIS — F17200 Nicotine dependence, unspecified, uncomplicated: Secondary | ICD-10-CM | POA: Insufficient documentation

## 2011-07-14 DIAGNOSIS — M5030 Other cervical disc degeneration, unspecified cervical region: Secondary | ICD-10-CM | POA: Insufficient documentation

## 2011-07-14 DIAGNOSIS — Z794 Long term (current) use of insulin: Secondary | ICD-10-CM | POA: Insufficient documentation

## 2011-07-14 HISTORY — DX: Reserved for concepts with insufficient information to code with codable children: IMO0002

## 2011-07-14 MED ORDER — DIAZEPAM 5 MG PO TABS: 5.0000 mg | ORAL_TABLET | Freq: Four times a day (QID) | ORAL | Status: AC | PRN

## 2011-07-14 MED ORDER — TRAMADOL HCL 50 MG PO TABS: 50.0000 mg | ORAL_TABLET | Freq: Four times a day (QID) | ORAL | Status: DC | PRN

## 2011-07-14 MED ORDER — TRAMADOL HCL 50 MG PO TABS: 50.0000 mg | ORAL_TABLET | Freq: Four times a day (QID) | ORAL | Status: AC | PRN

## 2011-07-14 NOTE — ED Notes (Signed)
Hx of ddd and complains of back apin.

## 2011-07-14 NOTE — ED Notes (Signed)
Pt has hx of DDD and had an appt at [facility] today, but was late and they couldn't see her.  States she has been out of her pain med, Opana, for 2-3 weeks.  C/o pain in low back and into both knees.

## 2011-07-14 NOTE — ED Notes (Signed)
Pt stated she was supposed she has chronic back pain d/t degenerative disc disc disease. She ran out of her normal medication and does not have a Drs appointment until May. So she came to the ED to get medication for her back pain and chronic leg pain (from arthritis). Pt is ambulatory. Will continue to monitor.

## 2011-07-14 NOTE — ED Provider Notes (Signed)
History     CSN: [id number]  Arrival date & time 07/14/11  1423   First MD Initiated Contact with Patient 07/14/11 1604      Chief Complaint  Patient presents with  . Back Pain    (Consider location/radiation/quality/duration/timing/severity/associated sxs/prior treatment) Patient is a 56 y.o. female presenting with back pain. The history is provided by the patient.  Back Pain    patient here with chronic lower back pain. She is out of her medications x3 weeks. No New neurological findings or trauma. Patient at urgent care and was not given a prescription for pain medication therefore she presented here. She was scheduled to see her Dr. today but missed the appointment. She normally takes opana but when she ran out she was taking tramadol. Sharp pain worse with movement and better with rest and similar to her prior chronic pain  .  Past Medical History  Diagnosis Date  . Hypertension   . Diabetes mellitus   . Asthma   . Depression   . Acid reflux   . Degenerative disc disease     Past Surgical History  Procedure Date  . Knee surgery     History reviewed. No pertinent family history.  History  Substance Use Topics  . Smoking status: Current Everyday Smoker  . Smokeless tobacco: Not on file  . Alcohol Use: No    OB History    Grav Para Term Preterm Abortions TAB SAB Ect Mult Living                  Review of Systems  Musculoskeletal: Positive for back pain.  All other systems reviewed and are negative.    Allergies  Review of patient's allergies indicates no known allergies.  Home Medications   Current Outpatient Rx  Name Route Sig Dispense Refill  . ALBUTEROL SULFATE HFA 108 (90 BASE) MCG/ACT IN AERS Inhalation Inhale 2 puffs into the lungs every 6 (six) hours as needed.    [name] AMLODIPINE BESYLATE 10 MG PO TABS Oral Take 10 mg by mouth daily.    [name] HYDROCOD POLST-CPM POLST ER 10-8 MG/5ML PO LQCR Oral Take 5 mLs by mouth at bedtime as needed. For cough.     . CLONIDINE HCL 0.1 MG PO TABS Oral Take 0.1 mg by mouth 2 (two) times daily.    . CYCLOBENZAPRINE HCL 10 MG PO TABS Oral Take 10 mg by mouth 3 (three) times daily.    [name] GABAPENTIN 300 MG PO CAPS Oral Take 600 mg by mouth 3 (three) times daily.    [name] HYDROCHLOROTHIAZIDE 25 MG PO TABS Oral Take 25 mg by mouth daily.    . INSULIN GLARGINE 100 UNIT/ML [place] SOLN Subcutaneous Inject 40 Units into the skin 2 (two) times daily.    [name] LISINOPRIL 20 MG PO TABS Oral Take 20 mg by mouth daily.    [name] METFORMIN HCL ER (MOD) 1000 MG PO TB24 Oral Take 1,000 mg by mouth 2 (two) times daily with a meal.    . OXYCODONE HCL 10 MG PO TABS Oral Take 10 mg by mouth 4 (four) times daily as needed.    [name] OXYMORPHONE HCL 10 MG PO TABS Oral Take 20 mg by mouth 2 (two) times daily.    . SULINDAC 200 MG PO TABS Oral Take 200 mg by mouth 2 (two) times daily.    . TRAMADOL HCL 50 MG PO TABS Oral Take 50 mg by mouth every 6 (six) hours as needed. For  pain.    [name] TRAZODONE HCL 50 MG PO TABS Oral Take 50 mg by mouth at bedtime.      BP 110/60  Pulse 88  Temp(Src) 98.2 F (36.8 C) (Oral)  Resp 20  SpO2 95%  Physical Exam  Nursing note and vitals reviewed. Constitutional: She is oriented to person, place, and time. She appears well-developed and well-nourished.  Non-toxic appearance. No distress.  HENT:  Head: Normocephalic and atraumatic.  Eyes: Conjunctivae, EOM and lids are normal. Pupils are equal, round, and reactive to light.  Neck: Normal range of motion. Neck supple. No tracheal deviation present. No mass present.  Cardiovascular: Normal rate, regular rhythm and normal heart sounds.  Exam reveals no gallop.   No murmur heard. Pulmonary/Chest: Effort normal and breath sounds normal. No stridor. No respiratory distress. She has no decreased breath sounds. She has no wheezes. She has no rhonchi. She has no rales.  Abdominal: Soft. Normal appearance and bowel sounds are normal. She exhibits no distension. There is no  tenderness. There is no rebound and no CVA tenderness.  Musculoskeletal: Normal range of motion. She exhibits no edema and no tenderness.       Paraspinal muscle lower back pain  Neurological: She is alert and oriented to person, place, and time. She has normal strength. No cranial nerve deficit or sensory deficit. GCS eye subscore is 4. GCS verbal subscore is 5. GCS motor subscore is 6.  Skin: Skin is warm and dry. No abrasion and no rash noted.  Psychiatric: She has a normal mood and affect. Her speech is normal and behavior is normal.    ED Course  Procedures (including critical care time)  Labs Reviewed - No data to display No results found.   No diagnosis found.    MDM  Patient given prescription for tramadol and Valium. Was instructed to followup with her primary care Dr. for her opiate needs        Toy Baker, MD 07/14/11 1616

## 2011-07-14 NOTE — ED Provider Notes (Signed)
History     CSN: [id number]  Arrival date & time 07/14/11  1025   First MD Initiated Contact with Patient 07/14/11 1144      Chief Complaint  Patient presents with  . Back Pain  . Leg Pain    (Consider location/radiation/quality/duration/timing/severity/associated sxs/prior treatment) HPI Comments: The patient reports she has a long history of ddd of the spine and some peripheral neuropathy for diabetes. [facility] and [facility] did not have andy walk in appts. She then states she had and appt and was 30 min late so the would not see her. She states she has been out of her pain med for 3 wks. No new injury. No new symptoms. She is requesting pain medication refill. This was declined. I  informed her that we will try to treat her pain but a perscription of narcotic pain medication would not be given.   Patient is a 56 y.o. female presenting with leg pain. The history is provided by the patient.  Leg Pain     Past Medical History  Diagnosis Date  . Hypertension   . Diabetes mellitus   . Asthma   . Depression   . Acid reflux   . Degenerative disc disease     Past Surgical History  Procedure Date  . Knee surgery     History reviewed. No pertinent family history.  History  Substance Use Topics  . Smoking status: Current Everyday Smoker  . Smokeless tobacco: Not on file  . Alcohol Use: No    OB History    Grav Para Term Preterm Abortions TAB SAB Ect Mult Living                  Review of Systems  Constitutional: Negative.   Respiratory: Negative.   Cardiovascular: Negative.   Gastrointestinal: Negative.     Allergies  Review of patient's allergies indicates no known allergies.  Home Medications   Current Outpatient Rx  Name Route Sig Dispense Refill  . AMLODIPINE BESYLATE 10 MG PO TABS Oral Take 10 mg by mouth daily.    [name] CLONIDINE HCL 0.1 MG PO TABS Oral Take 0.1 mg by mouth 2 (two) times daily.    . CYCLOBENZAPRINE HCL  10 MG PO TABS Oral Take 10 mg by mouth 3 (three) times daily.    [name] GABAPENTIN 300 MG PO CAPS Oral Take 600 mg by mouth 3 (three) times daily.    [name] HYDROCHLOROTHIAZIDE 25 MG PO TABS Oral Take 25 mg by mouth daily.    . INSULIN GLARGINE 100 UNIT/ML [place] SOLN Subcutaneous Inject 40 Units into the skin 2 (two) times daily.    [name] LISINOPRIL 20 MG PO TABS Oral Take 20 mg by mouth daily.    [name] OXYMORPHONE HCL 10 MG PO TABS Oral Take 20 mg by mouth 2 (two) times daily.    . SULINDAC 200 MG PO TABS Oral Take 200 mg by mouth 2 (two) times daily.    [name] HYDROCOD POLST-CPM POLST ER 10-8 MG/5ML PO LQCR Oral Take 5 mLs by mouth at bedtime as needed. 50 mL 0  . METFORMIN HCL ER (MOD) 1000 MG PO TB24 Oral Take 1,000 mg by mouth 2 (two) times daily with a meal.    . OXYCODONE HCL 10 MG PO TABS Oral Take 1 tablet (10 mg total) by mouth 4 (four) times daily as needed. 20 each 0  . PREDNISONE 10 MG PO TABS Oral  Take 2 tablets (20 mg total) by mouth 2 (two) times daily. 10 tablet 0  . TRAMADOL HCL 50 MG PO TABS Oral Take 1 tablet (50 mg total) by mouth every 6 (six) hours as needed for pain. 20 tablet 0  . TRAZODONE HCL 50 MG PO TABS Oral Take 50 mg by mouth at bedtime.      BP 126/70  Pulse 76  Temp(Src) 98.1 F (36.7 C) (Oral)  Resp 14  SpO2 95%  Physical Exam  Nursing note and vitals reviewed. Constitutional: She appears well-nourished.  Pulmonary/Chest: Effort normal.  Abdominal: Soft.  Musculoskeletal: She exhibits no edema and no tenderness.  Neurological: She displays normal reflexes. She exhibits normal muscle tone.  Skin: Skin is warm and dry.    ED Course  Procedures (including critical care time)  Labs Reviewed - No data to display No results found.   1. Chronic pain       MDM          Randa Spike, MD 07/14/11 1316

## 2011-07-14 NOTE — Discharge Instructions (Signed)
Chronic Back Pain When back pain lasts longer than 3 months, it is called chronic back pain.This pain can be frustrating, but the cause of the pain is rarely dangerous.People with chronic back pain often go through certain periods that are more intense (flare-ups). CAUSES Chronic back pain can be caused by wear and tear (degeneration) on different structures in your back. These structures may include bones, ligaments, or discs. This degeneration may result in more pressure being placed on the nerves that travel to your legs and feet. This can lead to pain traveling from the low back down the back of the legs. When pain lasts longer than 3 months, it is not unusual for people to experience anxiety or depression. Anxiety and depression can also contribute to low back pain. TREATMENT  Establish a regular exercise plan. This is critical to improving your functional level.   Have a self-management plan for when you flare-up. Flare-ups rarely require a medical visit. Regular exercise will help reduce the intensity and frequency of your flare-ups.   Manage how you feel about your back pain and the rest of your life. Anxiety, depression, and feeling that you cannot alter your back pain have been shown to make back pain more intense and debilitating.   Medicines should never be your only treatment. They should be used along with other treatments to help you return to a more active lifestyle.   Procedures such as injections or surgery may be helpful but are rarely necessary. You may be able to get the same results with physical therapy or chiropractic care.  HOME CARE INSTRUCTIONS  Avoid bending, heavy lifting, prolonged sitting, and activities which make the problem worse.   Continue normal activity as much as possible.   Take brief periods of rest throughout the day to reduce your pain during flare-ups.   Follow your back exercise rehabilitation program. This can help reduce symptoms and prevent  more pain.   Only take over-the-counter or prescription medicines as directed by your caregiver. Muscle relaxants are sometimes prescribed. Narcotic pain medicine is discouraged for long-term pain, since addiction is a possible outcome.   If you smoke, quit.   Eat healthy foods and maintain a recommended body weight.  SEEK IMMEDIATE MEDICAL CARE IF:   You have weakness or numbness in one of your legs or feet.   You have trouble controlling your bladder or bowels.   You develop nausea, vomiting, abdominal pain, shortness of breath, or fainting.  Document Released: 05/08/2004 Document Revised: 03/20/2011 Document Reviewed: 03/15/2011 [facility] Patient Information 2012 [place], [place].

## 2011-07-14 NOTE — Discharge Instructions (Signed)
You will need for follow up with your pcp. Check for walk in appts as well. May take advil and aleve as well. Apply heat three times daily alternating with ice.

## 2011-08-22 ENCOUNTER — Other Ambulatory Visit (HOSPITAL_COMMUNITY): Payer: Self-pay | Admitting: Internal Medicine

## 2011-08-22 DIAGNOSIS — M25512 Pain in left shoulder: Secondary | ICD-10-CM

## 2011-08-28 ENCOUNTER — Other Ambulatory Visit (HOSPITAL_COMMUNITY): Payer: Medicaid Other

## 2012-04-22 ENCOUNTER — Inpatient Hospital Stay (HOSPITAL_COMMUNITY): Payer: Medicaid Other

## 2012-04-22 ENCOUNTER — Emergency Department (HOSPITAL_COMMUNITY): Payer: Medicaid Other

## 2012-04-22 ENCOUNTER — Encounter (HOSPITAL_COMMUNITY): Payer: Self-pay | Admitting: *Deleted

## 2012-04-22 ENCOUNTER — Inpatient Hospital Stay (HOSPITAL_COMMUNITY)
Admission: EM | Admit: 2012-04-22 | Discharge: 2012-04-24 | DRG: 066 | Disposition: A | Discharge status: Home Health | Payer: Medicaid Other | Attending: Internal Medicine | Admitting: Internal Medicine

## 2012-04-22 DIAGNOSIS — IMO0002 Reserved for concepts with insufficient information to code with codable children: Secondary | ICD-10-CM | POA: Diagnosis present

## 2012-04-22 DIAGNOSIS — R7309 Other abnormal glucose: Secondary | ICD-10-CM

## 2012-04-22 DIAGNOSIS — I10 Essential (primary) hypertension: Secondary | ICD-10-CM | POA: Diagnosis present

## 2012-04-22 DIAGNOSIS — Z794 Long term (current) use of insulin: Secondary | ICD-10-CM

## 2012-04-22 DIAGNOSIS — J45909 Unspecified asthma, uncomplicated: Secondary | ICD-10-CM | POA: Diagnosis present

## 2012-04-22 DIAGNOSIS — Z8673 Personal history of transient ischemic attack (TIA), and cerebral infarction without residual deficits: Secondary | ICD-10-CM | POA: Diagnosis present

## 2012-04-22 DIAGNOSIS — I639 Cerebral infarction, unspecified: Secondary | ICD-10-CM

## 2012-04-22 DIAGNOSIS — E119 Type 2 diabetes mellitus without complications: Secondary | ICD-10-CM | POA: Diagnosis present

## 2012-04-22 DIAGNOSIS — K219 Gastro-esophageal reflux disease without esophagitis: Secondary | ICD-10-CM | POA: Diagnosis present

## 2012-04-22 DIAGNOSIS — I6350 Cerebral infarction due to unspecified occlusion or stenosis of unspecified cerebral artery: Principal | ICD-10-CM | POA: Diagnosis present

## 2012-04-22 DIAGNOSIS — Z79899 Other long term (current) drug therapy: Secondary | ICD-10-CM

## 2012-04-22 DIAGNOSIS — E876 Hypokalemia: Secondary | ICD-10-CM | POA: Diagnosis present

## 2012-04-22 DIAGNOSIS — F329 Major depressive disorder, single episode, unspecified: Secondary | ICD-10-CM | POA: Diagnosis present

## 2012-04-22 DIAGNOSIS — F17200 Nicotine dependence, unspecified, uncomplicated: Secondary | ICD-10-CM | POA: Diagnosis present

## 2012-04-22 DIAGNOSIS — R739 Hyperglycemia, unspecified: Secondary | ICD-10-CM

## 2012-04-22 DIAGNOSIS — F3289 Other specified depressive episodes: Secondary | ICD-10-CM | POA: Diagnosis present

## 2012-04-22 LAB — URINALYSIS, ROUTINE W REFLEX MICROSCOPIC
Glucose, UA: >1000 mg/dL — AB
Ketones, ur: NEGATIVE mg/dL
Leukocytes, UA: NEGATIVE
Nitrite: NEGATIVE
Protein, ur: NEGATIVE mg/dL
Urobilinogen, UA: 1 mg/dL (ref 0.0–1.0)

## 2012-04-22 LAB — COMPREHENSIVE METABOLIC PANEL
AST: 9 U/L (ref 0–37)
Albumin: 3.6 g/dL (ref 3.5–5.2)
Alkaline Phosphatase: 143 U/L — ABNORMAL HIGH (ref 39–117)
BUN: 19 mg/dL (ref 6–23)
Chloride: 98 mEq/L (ref 96–112)
Creatinine, Ser: 0.88 mg/dL (ref 0.50–1.10)
Potassium: 3.1 mEq/L — ABNORMAL LOW (ref 3.5–5.1)
Total Bilirubin: 0.2 mg/dL — ABNORMAL LOW (ref 0.3–1.2)
Total Protein: 7.1 g/dL (ref 6.0–8.3)

## 2012-04-22 LAB — CBC WITH DIFFERENTIAL/PLATELET
Basophils Absolute: 0 10*3/uL (ref 0.0–0.1)
Basophils Relative: 0 % (ref 0–1)
Eosinophils Absolute: 0.2 10*3/uL (ref 0.0–0.7)
MCH: 28.4 pg (ref 26.0–34.0)
MCHC: 32.6 g/dL (ref 30.0–36.0)
Monocytes Relative: 6 % (ref 3–12)
Neutro Abs: 3.5 10*3/uL (ref 1.7–7.7)
Neutrophils Relative %: 39 % — ABNORMAL LOW (ref 43–77)
RDW: 13.6 % (ref 11.5–15.5)

## 2012-04-22 LAB — PROTIME-INR
INR: 0.95 (ref 0.00–1.49)
Prothrombin Time: 12.6 seconds (ref 11.6–15.2)

## 2012-04-22 LAB — GLUCOSE, CAPILLARY: Glucose-Capillary: 196 mg/dL — ABNORMAL HIGH (ref 70–99)

## 2012-04-22 LAB — TROPONIN I: Troponin I: <0.3 ng/mL (ref <0.30)

## 2012-04-22 LAB — URINE MICROSCOPIC-ADD ON

## 2012-04-22 MED ORDER — POTASSIUM CHLORIDE CRYS ER 20 MEQ PO TBCR
40.0000 meq | EXTENDED_RELEASE_TABLET | Freq: Once | ORAL | Status: AC
  Administered 2012-04-22: 40 meq via ORAL
  Filled 2012-04-22: qty 2

## 2012-04-22 MED ORDER — ENOXAPARIN SODIUM 30 MG/0.3ML ~~LOC~~ SOLN
30.0000 mg | SUBCUTANEOUS | Status: DC
  Administered 2012-04-23: 30 mg via SUBCUTANEOUS
  Filled 2012-04-22 (×3): qty 0.3

## 2012-04-22 MED ORDER — HYDROCHLOROTHIAZIDE 25 MG PO TABS
25.0000 mg | ORAL_TABLET | Freq: Every morning | ORAL | Status: DC
  Administered 2012-04-23 – 2012-04-24 (×2): 25 mg via ORAL
  Filled 2012-04-22 (×2): qty 1

## 2012-04-22 MED ORDER — TRAMADOL HCL 50 MG PO TABS: 50.0000 mg | ORAL_TABLET | Freq: Four times a day (QID) | ORAL | Status: DC | PRN

## 2012-04-22 MED ORDER — CLONIDINE HCL 0.2 MG PO TABS
0.2000 mg | ORAL_TABLET | Freq: Every day | ORAL | Status: DC
  Administered 2012-04-22 – 2012-04-23 (×2): 0.2 mg via ORAL
  Filled 2012-04-22 (×3): qty 1

## 2012-04-22 MED ORDER — INSULIN GLARGINE 100 UNIT/ML ~~LOC~~ SOLN
25.0000 [IU] | Freq: Three times a day (TID) | SUBCUTANEOUS | Status: DC
  Administered 2012-04-22 – 2012-04-23 (×4): 25 [IU] via SUBCUTANEOUS

## 2012-04-22 MED ORDER — CYCLOBENZAPRINE HCL 10 MG PO TABS
10.0000 mg | ORAL_TABLET | Freq: Three times a day (TID) | ORAL | Status: DC
  Administered 2012-04-22 – 2012-04-24 (×5): 10 mg via ORAL
  Filled 2012-04-22 (×7): qty 1

## 2012-04-22 MED ORDER — GABAPENTIN 300 MG PO CAPS
300.0000 mg | ORAL_CAPSULE | Freq: Three times a day (TID) | ORAL | Status: DC
  Administered 2012-04-22 – 2012-04-24 (×5): 300 mg via ORAL
  Filled 2012-04-22 (×8): qty 1

## 2012-04-22 MED ORDER — VITAMIN D3 25 MCG (1000 UNIT) PO TABS
3000.0000 [IU] | ORAL_TABLET | Freq: Every morning | ORAL | Status: DC
  Administered 2012-04-23 – 2012-04-24 (×2): 3000 [IU] via ORAL
  Filled 2012-04-22 (×2): qty 3

## 2012-04-22 MED ORDER — LISINOPRIL 20 MG PO TABS
20.0000 mg | ORAL_TABLET | Freq: Every morning | ORAL | Status: DC
  Administered 2012-04-23 – 2012-04-24 (×2): 20 mg via ORAL
  Filled 2012-04-22 (×2): qty 1

## 2012-04-22 MED ORDER — BUPROPION HCL ER (SR) 150 MG PO TB12
150.0000 mg | ORAL_TABLET | Freq: Every day | ORAL | Status: DC
  Administered 2012-04-23 – 2012-04-24 (×2): 150 mg via ORAL
  Filled 2012-04-22 (×2): qty 1

## 2012-04-22 MED ORDER — SENNOSIDES-DOCUSATE SODIUM 8.6-50 MG PO TABS
1.0000 | ORAL_TABLET | Freq: Every evening | ORAL | Status: DC | PRN
  Filled 2012-04-22: qty 1

## 2012-04-22 MED ORDER — OXYCODONE HCL 5 MG PO TABS
10.0000 mg | ORAL_TABLET | Freq: Three times a day (TID) | ORAL | Status: DC | PRN
  Administered 2012-04-23 – 2012-04-24 (×2): 10 mg via ORAL
  Filled 2012-04-22 (×2): qty 2

## 2012-04-22 MED ORDER — METFORMIN HCL ER 500 MG PO TB24
1000.0000 mg | ORAL_TABLET | Freq: Two times a day (BID) | ORAL | Status: DC
  Administered 2012-04-23 – 2012-04-24 (×3): 1000 mg via ORAL
  Filled 2012-04-22 (×6): qty 2

## 2012-04-22 MED ORDER — MORPHINE SULFATE 15 MG PO TABS
15.0000 mg | ORAL_TABLET | ORAL | Status: DC | PRN
  Administered 2012-04-23: 15 mg via ORAL
  Filled 2012-04-22: qty 1

## 2012-04-22 MED ORDER — TRAZODONE HCL 50 MG PO TABS
50.0000 mg | ORAL_TABLET | Freq: Every day | ORAL | Status: DC
  Administered 2012-04-22 – 2012-04-23 (×2): 50 mg via ORAL
  Filled 2012-04-22 (×3): qty 1

## 2012-04-22 MED ORDER — SULINDAC 200 MG PO TABS
200.0000 mg | ORAL_TABLET | Freq: Two times a day (BID) | ORAL | Status: DC
  Administered 2012-04-22 – 2012-04-24 (×4): 200 mg via ORAL
  Filled 2012-04-22 (×5): qty 1

## 2012-04-22 MED ORDER — AMLODIPINE BESYLATE 10 MG PO TABS
10.0000 mg | ORAL_TABLET | Freq: Every morning | ORAL | Status: DC
  Administered 2012-04-23 – 2012-04-24 (×2): 10 mg via ORAL
  Filled 2012-04-22 (×2): qty 1

## 2012-04-22 MED ORDER — ALBUTEROL SULFATE HFA 108 (90 BASE) MCG/ACT IN AERS: 2.0000 | INHALATION_SPRAY | Freq: Four times a day (QID) | RESPIRATORY_TRACT | Status: DC | PRN

## 2012-04-22 NOTE — ED Notes (Addendum)
Pt reports having waking up yesterday am with slurred speech which immediately got better after wards.  Pt reports last night before she went to bed she was able to talk normal.  Pt reports waking up this am with slurred speech, L facial droop and L side weakness.  Pt reports "walking weird".  Pt is A&Ox 4.  Pt denies any cp or SOB at this time.   R side facial droop and slurred speech noted.  Dr. Preston Fleeting at bedside along with primary nurse Lenis Dickinson and Stark Jock RN.  Gerilyn Pilgrim and Denisa NTs at bedside as well.

## 2012-04-22 NOTE — Progress Notes (Signed)
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 Pt provides total care for family member. Pt stated that she does not have anyone that she can call to come take care of family member. Education provided to pt about hospital not being able to care for her family member and that it is not safe for her to care for him. Pt still stated that she did not have anyone to help care for him. Education provided that Child psychotherapist will get involved with case in order to find appropriate care for pt family member while she is in the hospital. Gyneth Hubka A

## 2012-04-22 NOTE — H&P (Signed)
[facility] History and Physical  Miranda [name] WUJ:[id number] DOB: 29-Jul-1955 DOA: 04/22/2012  Referring physician: ED physician PCP: Dorrene German, MD   Chief Complaint: slurred speech   HPI:  Pt is 57 yo female with history of uncontrolled diabetes, HTN who presents to [facility] ED with main concern of intermittent episodes of slurred speech over the past week. Last episode being the morning of admission. She explains she was in her usual state of health and went to bed only to wake up with slurred speech and bilateral lower extremity weakness, unable to bear weight and having difficulty with ambulation. She reports intermittent episodes of similar symptoms but they usually resolve. She denies chest pain or shortness of breath, no abdominal or urinary concerns, no headaches or visual changes. No fevers, chills, other systemic symptoms.  In ED, CT head indicated acute/subacute left basal ganglia stroke and [facility] asked for admission and stroke work up.   Assessment and Plan:  Principal Problem:  *Stroke - left basal ganglia stroke noted on CT - will proceed with stroke work up and obtain MRI/MRA brain, carotid doppler, 2 D ECHO - order lipid panel and A1C, to assess level of control - PT/OT/SLP evaluation - continue home antihypertensive regimen and diabetic regimen - once MRI back and stroke confirmed to be acute may consider neurology consultation for further assistance  Active Problems:  Diabetes mellitus - will check A1C to assess the level of control - continue home medication regimen and readjust as indicated   HTN (hypertension) - reasonable control in ED - continue home medication regimen   Hypokalemia - mild  - will supplement and obtain BMP in AM  Code Status: Full Family Communication: Pt at bedside Disposition Plan: Admit to telemetry floor  Review of Systems:  Constitutional: Negative for fever, chills and malaise/fatigue. Negative for diaphoresis.  HENT:  Negative for hearing loss, ear pain, nosebleeds, congestion, sore throat, neck pain, tinnitus and ear discharge.   Eyes: Negative for blurred vision, double vision, photophobia, pain, discharge and redness.  Respiratory: Negative for cough, hemoptysis, sputum production, shortness of breath, wheezing and stridor.   Cardiovascular: Negative for chest pain, palpitations, orthopnea, claudication and leg swelling.  Gastrointestinal: Negative for nausea, vomiting and abdominal pain. Negative for heartburn, constipation, blood in stool and melena.  Genitourinary: Negative for dysuria, urgency, frequency, hematuria and flank pain.  Musculoskeletal: Negative for myalgias, back pain, joint pain and falls.  Skin: Negative for itching and rash.  Neurological: Negative for dizziness. Negative for tingling, tremors, sensory change, positive for speech change, lower extremity weakness Endo/Heme/Allergies: Negative for environmental allergies and polydipsia. Does not bruise/bleed easily.  Psychiatric/Behavioral: Negative for suicidal ideas. The patient is not nervous/anxious.      Past Medical History  Diagnosis Date  . Hypertension   . Diabetes mellitus   . Asthma   . Depression   . Acid reflux   . Degenerative disc disease     Past Surgical History  Procedure Date  . Knee surgery     Social History:  reports that she has been smoking Cigarettes.  She has never used smokeless tobacco. She reports that she does not drink alcohol or use illicit drugs.  No Known Allergies  No family history of cancers or heart disease   Prior to Admission medications   Medication Sig Start Date End Date Taking? Authorizing Provider  albuterol (PROVENTIL HFA;VENTOLIN HFA) 108 (90 BASE) MCG/ACT inhaler Inhale 2 puffs into the lungs every 6 (six) hours as needed. Shortness  of breath   Yes Historical Provider, MD  amLODipine (NORVASC) 10 MG tablet Take 10 mg by mouth every morning.    Yes Historical Provider, MD    buPROPion (WELLBUTRIN SR) 150 MG 12 hr tablet Take 150 mg by mouth daily.   Yes Historical Provider, MD  cholecalciferol (VITAMIN D) 1000 UNITS tablet Take 3,000 Units by mouth every morning.   Yes Historical Provider, MD  cloNIDine (CATAPRES) 0.2 MG tablet Take 0.2 mg by mouth at bedtime.   Yes Historical Provider, MD  cyclobenzaprine (FLEXERIL) 10 MG tablet Take 10 mg by mouth 3 (three) times daily.   Yes Historical Provider, MD  gabapentin (NEURONTIN) 300 MG capsule Take 300 mg by mouth 3 (three) times daily.    Yes Historical Provider, MD  hydrochlorothiazide (HYDRODIURIL) 25 MG tablet Take 25 mg by mouth every morning.    Yes Historical Provider, MD  insulin glargine (LANTUS) 100 UNIT/ML injection Inject 25 Units into the skin 3 (three) times daily.    Yes Historical Provider, MD  lisinopril (PRINIVIL,ZESTRIL) 20 MG tablet Take 20 mg by mouth every morning.    Yes Historical Provider, MD  metFORMIN (GLUMETZA) 1000 MG (MOD) 24 hr tablet Take 1,000 mg by mouth 2 (two) times daily with a meal.   Yes Historical Provider, MD  Oxycodone HCl 10 MG TABS Take 10 mg by mouth 3 (three) times daily as needed. pain   Yes Arie Sabina Schinlever, PA-C  oxymorphone (OPANA) 10 MG tablet Take 20 mg by mouth 3 (three) times daily. pain   Yes Historical Provider, MD  sulindac (CLINORIL) 200 MG tablet Take 200 mg by mouth 2 (two) times daily.   Yes Historical Provider, MD  traMADol (ULTRAM) 50 MG tablet Take 50 mg by mouth every 6 (six) hours as needed. For pain.   Yes Randa Spike, MD  traZODone (DESYREL) 50 MG tablet Take 50 mg by mouth at bedtime.   Yes Historical Provider, MD    Physical Exam: Filed Vitals:   04/22/12 1343 04/22/12 1540  BP: 114/64   Pulse: 76   Temp: 98.5 F (36.9 C) 98 F (36.7 C)  TempSrc: Oral   Resp: 20   SpO2: 94%     Physical Exam  Constitutional: Appears well-developed and well-nourished. No distress.  HENT: Normocephalic. External right and left ear normal.  Oropharynx is clear and moist.  Eyes: Conjunctivae and EOM are normal. PERRLA, no scleral icterus.  Neck: Normal ROM. Neck supple. No JVD. No tracheal deviation. No thyromegaly.  CVS: RRR, S1/S2 +, no murmurs, no gallops, no carotid bruit.  Pulmonary: Effort and breath sounds normal, no stridor, rhonchi, wheezes, rales.  Abdominal: Soft. BS +,  no distension, tenderness, rebound or guarding.  Musculoskeletal: Normal range of motion. No edema and no tenderness.  Lymphadenopathy: No lymphadenopathy noted, cervical, inguinal. Neuro: Alert. Normal reflexes, No cranial nerve deficit.Lower extremity strength 4/5 Skin: Skin is warm and dry. No rash noted. Not diaphoretic. No erythema. No pallor.  Psychiatric: Normal mood and affect. Behavior, judgment, thought content normal.   Labs on Admission:  Basic Metabolic Panel:  Lab 04/22/12 1610  NA 137  K 3.1*  CL 98  CO2 29  GLUCOSE 265*  BUN 19  CREATININE 0.88  CALCIUM 9.7  MG --  PHOS --   Liver Function Tests:  Lab 04/22/12 1355  AST 9  ALT 11  ALKPHOS 143*  BILITOT 0.2*  PROT 7.1  ALBUMIN 3.6   CBC:  Lab 04/22/12 1355  WBC 9.0  NEUTROABS 3.5  HGB 13.2  HCT 40.5  MCV 87.3  PLT 289   Cardiac Enzymes:  Lab 04/22/12 1355  CKTOTAL --  CKMB --  CKMBINDEX --  TROPONINI <0.30    Radiological Exams on Admission: Ct Head Wo Contrast  04/22/2012  *RADIOLOGY REPORT*  Clinical Data: Right facial droop, right-sided weakness, slurred speech, aphasia  CT HEAD WITHOUT CONTRAST  Technique:  Contiguous axial images were obtained from the base of the skull through the vertex without contrast.  Comparison: None.  Findings: Hypodensity in the left basal ganglia (series 2/image 13), suspicious for acute/subacute lacunar infarct.  No evidence of parenchymal hemorrhage or extra-axial fluid collection. No mass lesion, mass effect, or midline shift.  Subcortical white matter and periventricular small vessel ischemic changes.  The visualized  paranasal sinuses are essentially clear. The mastoid air cells are unopacified.  No evidence of calvarial fracture.  IMPRESSION: Hypodensity in the left basal ganglia, suspicious for acute/subacute infarct.  These results were called by telephone on 04/23/2011 at 1436 hours to Dr. Dione Booze, who verbally acknowledged these results.   Original Report Authenticated By: Charline Bills, M.D.    Dg Chest Portable 1 View  04/22/2012  *RADIOLOGY REPORT*  Clinical Data: Diabetes, hypertension, aphasia  PORTABLE CHEST - 1 VIEW  Comparison: 05/12/2011  Findings: The heart and pulmonary vascularity are within normal limits.  The lungs are clear bilaterally.  No bony abnormality is seen.  IMPRESSION: No acute abnormality noted.   Original Report Authenticated By: Alcide Clever, M.D.     EKG: Normal sinus rhythm, no ST/T wave changes  Debbora Presto, MD  [facility] Pager [phone]  If 7PM-7AM, please contact night-coverage [email or web address] Password [facility] 04/22/2012, 4:47 PM

## 2012-04-22 NOTE — ED Provider Notes (Signed)
History     CSN: [id number]  Arrival date & time 04/22/12  1330   First MD Initiated Contact with Patient 04/22/12 1359      Chief Complaint  Patient presents with  . Aphasia  . Facial Droop  . Weakness    (Consider location/radiation/quality/duration/timing/severity/associated sxs/prior treatment) Patient is a 57 y.o. female presenting with weakness. The history is provided by the patient.  Weakness  Additional symptoms include weakness.  She noted that she was unable to speak normally when she woke up this morning. Last time she was able to speak normally he was last night, but she also had some difficulty speaking at yesterday morning which had resolved. She denies any arm or leg weakness. Denies headache. Denies nausea vomiting. Denies chest pain. Symptoms are moderate to severe. Nothing makes it better nothing makes it worse. She is treated for diabetes and hypertension with no known stroke history and no known TIA history.  Past Medical History  Diagnosis Date  . Hypertension   . Diabetes mellitus   . Asthma   . Depression   . Acid reflux   . Degenerative disc disease     Past Surgical History  Procedure Date  . Knee surgery     No family history on file.  History  Substance Use Topics  . Smoking status: Current Every Day Smoker  . Smokeless tobacco: Not on file  . Alcohol Use: No    OB History    Grav Para Term Preterm Abortions TAB SAB Ect Mult Living                  Review of Systems  Neurological: Positive for weakness.  All other systems reviewed and are negative.    Allergies  Review of patient's allergies indicates no known allergies.  Home Medications   Current Outpatient Rx  Name  Route  Sig  Dispense  Refill  . ALBUTEROL SULFATE HFA 108 (90 BASE) MCG/ACT IN AERS   Inhalation   Inhale 2 puffs into the lungs every 6 (six) hours as needed.         [name] AMLODIPINE BESYLATE 10 MG PO TABS   Oral   Take 10 mg by mouth daily.           [name] HYDROCOD POLST-CPM POLST ER 10-8 MG/5ML PO LQCR   Oral   Take 5 mLs by mouth at bedtime as needed. For cough.         . CLONIDINE HCL 0.1 MG PO TABS   Oral   Take 0.1 mg by mouth 2 (two) times daily.         . CYCLOBENZAPRINE HCL 10 MG PO TABS   Oral   Take 10 mg by mouth 3 (three) times daily.         [name] GABAPENTIN 300 MG PO CAPS   Oral   Take 600 mg by mouth 3 (three) times daily.         [name] HYDROCHLOROTHIAZIDE 25 MG PO TABS   Oral   Take 25 mg by mouth daily.         . INSULIN GLARGINE 100 UNIT/ML  SOLN   Subcutaneous   Inject 40 Units into the skin 2 (two) times daily.         [name] LISINOPRIL 20 MG PO TABS   Oral   Take 20 mg by mouth daily.         [name] METFORMIN HCL ER (MOD) 1000 MG PO TB24  Oral   Take 1,000 mg by mouth 2 (two) times daily with a meal.         . OXYCODONE HCL 10 MG PO TABS   Oral   Take 10 mg by mouth 4 (four) times daily as needed.         [name] OXYMORPHONE HCL 10 MG PO TABS   Oral   Take 20 mg by mouth 2 (two) times daily.         . SULINDAC 200 MG PO TABS   Oral   Take 200 mg by mouth 2 (two) times daily.         . TRAMADOL HCL 50 MG PO TABS   Oral   Take 50 mg by mouth every 6 (six) hours as needed. For pain.         . TRAZODONE HCL 50 MG PO TABS   Oral   Take 50 mg by mouth at bedtime.           BP 114/64  Pulse 76  Temp 98.5 F (36.9 C) (Oral)  Resp 20  SpO2 94%  Physical Exam  Nursing note and vitals reviewed.  57 year old female, resting comfortably and in no acute distress. Vital signs are normal. Oxygen saturation is 94%, which is normal. Head is normocephalic and atraumatic. PERRLA, EOMI. Oropharynx is clear. Neck is nontender and supple without adenopathy or JVD. Back is nontender and there is no CVA tenderness. Lungs are clear without rales, wheezes, or rhonchi. Chest is nontender. Heart has regular rate and rhythm without murmur. Abdomen is soft, flat, nontender without masses or  hepatosplenomegaly and peristalsis is normoactive. Extremities have no cyanosis or edema, full range of motion is present. Skin is warm and dry without rash. Neurologic: She is awake, alert, oriented speech is fluent but dysarthric. Moderate right central facial droop is noted and tongue protrudes to the right of midline. Other cranial nerves appear intact. There is very slight right arm pronator drift. There is weakness of the right arm and right leg with strength of 4/5. Degree of weakness in arm and leg is approximately equal.   ED Course  Procedures (including critical care time)   Labs Reviewed  CBC WITH DIFFERENTIAL  COMPREHENSIVE METABOLIC PANEL  PROTIME-INR  APTT  TROPONIN I  URINALYSIS, ROUTINE W REFLEX MICROSCOPIC   No results found.   Date: 04/22/2012  Rate: 93  Rhythm: normal sinus rhythm  QRS Axis: normal  Intervals: normal  ST/T Wave abnormalities: nonspecific T wave changes  Conduction Disutrbances:none  Narrative Interpretation: Nonspecific T wave flattening in the anterolateral and lateral leads. When compared with ECG of 04/22/2012, no significant changes are seen.  Old EKG Reviewed: unchanged    1. Stroke   2. Hyperglycemia   3. Hypokalemia       MDM  Apparent stroke with right facial droop, dysarthric speech, right arm weakness and right leg weakness. She is outside of the window for thrombolytic treatment with last known normal being approximately 15 hours ago. That also places her out of the code stroke window. Stroke workup has been initiated and she will need to be admitted to the hospital.  CT has come back showing the left basal ganglia lacunar infarct which probably would account for all her symptoms. Case is discussed with Dr. Izola Price of [facility] who agrees to admit the patient.      Dione Booze, MD 04/22/12 1544

## 2012-04-23 DIAGNOSIS — I6789 Other cerebrovascular disease: Secondary | ICD-10-CM

## 2012-04-23 LAB — LIPID PANEL
HDL: 37 mg/dL — ABNORMAL LOW (ref >39)
Total CHOL/HDL Ratio: 4.8 RATIO
VLDL: 14 mg/dL (ref 0–40)

## 2012-04-23 LAB — GLUCOSE, CAPILLARY
Glucose-Capillary: 206 mg/dL — ABNORMAL HIGH (ref 70–99)
Glucose-Capillary: 235 mg/dL — ABNORMAL HIGH (ref 70–99)

## 2012-04-23 MED ORDER — ASPIRIN EC 325 MG PO TBEC
325.0000 mg | DELAYED_RELEASE_TABLET | Freq: Every day | ORAL | Status: DC
  Administered 2012-04-23 – 2012-04-24 (×2): 325 mg via ORAL
  Filled 2012-04-23 (×2): qty 1

## 2012-04-23 NOTE — Progress Notes (Signed)
  Echocardiogram 2D Echocardiogram has been performed.  [place]ian Co 04/23/2012, 4:07 PM

## 2012-04-23 NOTE — Progress Notes (Signed)
[facility] PROGRESS NOTE  KASSADIE PANCAKE ZOX:[id number] DOB: 1955-07-17 DOA: 04/22/2012 PCP: Dorrene German, MD  Assessment/Plan: *Stroke/acute L, lenticulostriate - PER mri - left basal ganglia stroke noted on CT  -continue ASA - follow lipid panel  -  Await PT/OT/SLP evaluation  - continue home antihypertensive regimen and diabetic regimen  -monitor and allow for permissive hypertension -  neurology consulted for further assistance  Active Problems:  Diabetes mellitus  - will check A1C -11.1, very poor control - continue home medication regimen and readjust as indicated  HTN (hypertension)  - reasonable control in ED  - continue home medication regimen  Hypokalemia  - follow and recheck   Code Status: full Family Communication:  Disposition Plan: home with home health when stable   Consultants:  Neuro- eval pending  Procedures:  Echo pending  Antibiotics:  none  HPI/Subjective: States her speech is still slurred  Objective: Filed Vitals:   04/22/12 1835 04/22/12 2150 04/23/12 0225 04/23/12 0635  BP:  134/70 118/61 135/65  Pulse:  64 58 63  Temp:  97.5 F (36.4 C) 98.2 F (36.8 C) 98 F (36.7 C)  TempSrc:  Oral Oral Oral  Resp:  16 18 20   Height: 5\' 10"  (1.778 m)     Weight: 98.1 kg (216 lb 4.3 oz)     SpO2:  100% 95% 97%   No intake or output data in the 24 hours ending 04/23/12 0851 Filed Weights   04/22/12 1835  Weight: 98.1 kg (216 lb 4.3 oz)    Exam:   General: in NAD, alert and oriented x3  Cardiovascular: RRR  Respiratory:CTAB  Abdomen: soft+BS, NT/ND  NEURO: R.facial weakness, extremities with nl strength  Data Reviewed: Basic Metabolic Panel:  Lab 04/22/12 8119  NA 137  K 3.1*  CL 98  CO2 29  GLUCOSE 265*  BUN 19  CREATININE 0.88  CALCIUM 9.7  MG --  PHOS --   Liver Function Tests:  Lab 04/22/12 1355  AST 9  ALT 11  ALKPHOS 143*  BILITOT 0.2*  PROT 7.1  ALBUMIN 3.6   No results found for this  basename: LIPASE:5,AMYLASE:5 in the last 168 hours No results found for this basename: AMMONIA:5 in the last 168 hours CBC:  Lab 04/22/12 1355  WBC 9.0  NEUTROABS 3.5  HGB 13.2  HCT 40.5  MCV 87.3  PLT 289   Cardiac Enzymes:  Lab 04/22/12 1355  CKTOTAL --  CKMB --  CKMBINDEX --  TROPONINI <0.30   BNP (last 3 results) No results found for this basename: PROBNP:3 in the last 8760 hours CBG:  Lab 04/23/12 0754 04/22/12 2158 04/22/12 1659  GLUCAP 152* 196* 305*    No results found for this or any previous visit (from the past 240 hour(s)).   Studies: Dg Chest 2 View  04/22/2012  *RADIOLOGY REPORT*  Clinical Data: Slurred speech.  Stroke.  Weakness.  CHEST - 2 VIEW  Comparison: 04/22/2012.  05/12/2011.  Findings: Low volume chest with left greater than right basilar atelectasis.  This produces crowding of the pulmonary vasculature on the lateral view.  No airspace disease.  No effusion. Cardiopericardial silhouette appears within normal limits.  Trachea midline.  IMPRESSION: Low volume chest.   Original Report Authenticated By: Andreas Newport, M.D.    Ct Head Wo Contrast  04/22/2012  *RADIOLOGY REPORT*  Clinical Data: Right facial droop, right-sided weakness, slurred speech, aphasia  CT HEAD WITHOUT CONTRAST  Technique:  Contiguous axial images were obtained from  the base of the skull through the vertex without contrast.  Comparison: None.  Findings: Hypodensity in the left basal ganglia (series 2/image 13), suspicious for acute/subacute lacunar infarct.  No evidence of parenchymal hemorrhage or extra-axial fluid collection. No mass lesion, mass effect, or midline shift.  Subcortical white matter and periventricular small vessel ischemic changes.  The visualized paranasal sinuses are essentially clear. The mastoid air cells are unopacified.  No evidence of calvarial fracture.  IMPRESSION: Hypodensity in the left basal ganglia, suspicious for acute/subacute infarct.  These results were  called by telephone on 04/23/2011 at 1436 hours to Dr. Dione Booze, who verbally acknowledged these results.   Original Report Authenticated By: Charline Bills, M.D.    Mr [facility] Wo Contrast  04/22/2012  *RADIOLOGY REPORT*  Clinical Data:  Right-sided facial droop and weakness. Diabetes and hypertension.  MRI HEAD WITHOUT CONTRAST MRA HEAD WITHOUT CONTRAST  Technique:  Multiplanar, multiecho pulse sequences of the brain and surrounding structures were obtained without intravenous contrast. Angiographic images of the head were obtained using MRA technique without contrast.  Comparison:  CT head earlier in the day.  MRI HEAD  Findings:  There is an acute left-sided deep white matter infarct affecting the periventricular white matter, basal ganglia, and posterior limb internal capsule within the left MCA/lenticulostriate artery territory.  This correlates with the CT abnormality.  No associated hemorrhage, mass effect, or midline shift.  Mild premature atrophy.  Chronic microvascular ischemic change affects the periventricular and subcortical white matter, likely sequelae of diabetes and hypertension.  Remote lacunar infarcts affect the subcortical white matter and cerebellum.  Chronic microvascular ischemic changes also in the brainstem.  Normal pituitary and cerebellar tonsils.  Major intracranial vascular structures patent.  No osseous findings.  No acute sinus or mastoid disease. Negative orbits.  IMPRESSION: Acute left lenticulostriate territory infarct without hemorrhage or mass effect.  Mild premature atrophy and chronic microvascular ischemic change.  MRA HEAD  Findings: The internal carotid arteries display mild irregularity in their petrous and cavernous segments without flow limiting stenosis.  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   Supraclinoid internal carotid arteries widely patent.  Mild irregularity at the origin of  the A1 and M1 left ACA and MCA vessels.  75% stenosis distal M1 segment left middle cerebral artery, potentially flow reducing, could account for the observed ischemic findings.  Occluded A1 segment right anterior cerebral artery with both distal anterior cerebrals filling from the left. 50% stenosis M1 segment left MCA.  Moderate irregularity in multiple vessels both right and left MCA M2/M3 segments without  branch occlusion.  Basilar artery patent with vertebrals codominant.  Moderate irregularity proximal PCAs bilaterally.  Severe diminution of flow in the distal right PCA vessels.  No cerebellar branch occlusion.  IMPRESSION: Widespread intracranial atherosclerotic change as described. 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   Original Report Authenticated By: Davonna Belling, M.D.    Mr Brain Wo Contrast  04/22/2012  *RADIOLOGY REPORT*  Clinical Data:  Right-sided facial droop and weakness. Diabetes and hypertension.  MRI HEAD WITHOUT CONTRAST MRA HEAD WITHOUT CONTRAST  Technique:  Multiplanar, multiecho pulse sequences of the brain and surrounding structures were obtained without intravenous contrast. Angiographic images of the head were obtained using MRA technique without contrast.  Comparison:  CT head earlier in the day.  MRI HEAD  Findings:  There is an acute left-sided deep white matter infarct affecting the periventricular white matter, basal ganglia, and posterior limb internal capsule within the left MCA/lenticulostriate artery territory.  This correlates with the CT abnormality.  No associated hemorrhage, mass effect, or midline shift.  Mild premature atrophy.  Chronic microvascular ischemic change affects the periventricular and subcortical white matter, likely sequelae of diabetes and hypertension.  Remote lacunar infarcts affect the subcortical white matter and cerebellum.  Chronic microvascular  ischemic changes also in the brainstem.  Normal pituitary and cerebellar tonsils.  Major intracranial vascular structures patent.  No osseous findings.  No acute sinus or mastoid disease. Negative orbits.  IMPRESSION: Acute left lenticulostriate territory infarct without hemorrhage or mass effect.  Mild premature atrophy and chronic microvascular ischemic change.  MRA HEAD  Findings: The internal carotid arteries display mild irregularity in their petrous and cavernous segments without flow limiting stenosis.  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   Supraclinoid internal carotid arteries widely patent.  Mild irregularity at the origin of the A1 and M1 left ACA and MCA vessels.  75% stenosis distal M1 segment left middle cerebral artery, potentially flow reducing, could account for the observed ischemic findings.  Occluded A1 segment right anterior cerebral artery with both distal anterior cerebrals filling from the left. 50% stenosis M1 segment left MCA.  Moderate irregularity in multiple vessels both right and left MCA M2/M3 segments without  branch occlusion.  Basilar artery patent with vertebrals codominant.  Moderate irregularity proximal PCAs bilaterally.  Severe diminution of flow in the distal right PCA vessels.  No cerebellar branch occlusion.  IMPRESSION: Widespread intracranial atherosclerotic change as described. 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   Original Report Authenticated By: Davonna Belling, M.D.    Dg Chest Portable 1 View  04/22/2012  *RADIOLOGY REPORT*  Clinical Data: Diabetes, hypertension, aphasia  PORTABLE CHEST - 1 VIEW  Comparison: 05/12/2011  Findings: The heart and pulmonary vascularity are within normal limits.  The lungs are clear bilaterally.  No bony abnormality is seen.  IMPRESSION: No acute  abnormality noted.   Original Report Authenticated By: Alcide Clever, M.D.     Scheduled Meds:   . amLODipine  10 mg Oral q morning - 10a  . buPROPion  150 mg Oral Daily  . cholecalciferol  3,000 Units Oral q morning - 10a  . cloNIDine  0.2 mg Oral QHS  . cyclobenzaprine  10 mg Oral TID  . enoxaparin  30 mg Subcutaneous Q24H  . gabapentin  300 mg Oral TID  . hydrochlorothiazide  25 mg Oral q morning - 10a  . insulin glargine  25 Units Subcutaneous TID  . lisinopril  20 mg Oral q morning - 10a  . metFORMIN  1,000 mg Oral BID WC  . sulindac  200 mg Oral BID  . traZODone  50 mg Oral QHS   Continuous Infusions:   Principal Problem:  *Stroke Active Problems:  Hypokalemia  Diabetes mellitus  HTN (hypertension)    Time spent: >30MINS    Kela Millin  [facility] Pager [phone] If 8PM-8AM, please contact night-coverage at [email or web address], password [facility] 04/23/2012, 8:51 AM  LOS: 1 day

## 2012-04-23 NOTE — Evaluation (Signed)
Occupational Therapy Evaluation Patient Details Name: Miranda [name] MRN: [id number] DOB: 1955/06/24 Today's Date: 04/23/2012 Time: 4098-1191 OT Time Calculation (min): 23 min  OT Assessment / Plan / Recommendation Clinical Impression  This 57 year old female was admitted with acute L lenticulostriate territory CVA.  She presented with dysarthria.  Pt also has numbness in LUE which she states is from a pinched nerve and not new.  PMH significant for htn, dm, depression and DDD.  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  Pt is appropriate for skilled OT to increase independence with ADLs.      OT Assessment  Patient needs continued OT Services    Follow Up Recommendations  Home health OT    Barriers to Discharge      Equipment Recommendations  Tub/shower bench (may need HHOT to make sure it will fit in bathroom)    Recommendations for Other Services    Frequency  Min 2X/week    Precautions / Restrictions Precautions Precautions: Fall Restrictions Weight Bearing Restrictions: No   Pertinent Vitals/Pain Pain only in LUE when she raises it--around 90 degrees.  Modified activity    ADL  Eating/Feeding: Simulated;Independent Where Assessed - Eating/Feeding: Edge of bed Grooming: Simulated;Set up Where Assessed - Grooming: Unsupported sitting Upper Body Bathing: Simulated;Set up Where Assessed - Upper Body Bathing: Unsupported sitting Lower Body Bathing: Simulated;Supervision/safety Where Assessed - Lower Body Bathing: Supported sit to stand Upper Body Dressing: Simulated;Set up Where Assessed - Upper Body Dressing: Unsupported sitting Lower Body Dressing: Performed;Supervision/safety Where Assessed - Lower Body Dressing: Supported sit to stand Toilet Transfer: Performed;Min guard Statistician Method: Sit to Barista: Comfort height toilet Toileting - Architect  and Hygiene: Simulated;Supervision/safety Where Assessed - Engineer, mining and Hygiene: Sit to stand from 3-in-1 or toilet Transfers/Ambulation Related to ADLs: ambulated to bathroom ADL Comments: Pt reports numbness in LUE--states she has had a pinched nerve for months.  RUE WFLs but handwriting does not look normal to her.  Gave supplies to practice in room.  Reports new blurriness--unable to track vertically.  Educated that MD will advise her when she will be able to drive:  she does not have support for getting groceries.       OT Diagnosis: Generalized weakness (acute cva)  OT Problem List: Decreased strength;Decreased activity tolerance;Impaired vision/perception;Impaired balance (sitting and/or standing);Pain;Impaired UE functional use OT Treatment Interventions: Self-care/ADL training;DME and/or AE instruction;Therapeutic activities;Balance training;Patient/family education;Visual/perceptual remediation/compensation   OT Goals Acute Rehab OT Goals OT Goal Formulation: With patient Time For Goal Achievement: 05/07/12 Potential to Achieve Goals: Good ADL Goals Pt Will Perform Tub/Shower Transfer: Tub transfer;Transfer tub bench;with supervision ADL Goal: Tub/Shower Transfer - Progress: Goal set today Miscellaneous OT Goals Miscellaneous OT Goal #1: pt will gather clothes with supervision and complete adl without supervision (set up) OT Goal: Miscellaneous Goal #1 - Progress: Goal set today Miscellaneous OT Goal #2: Pt will be independent with eye exercises (tracking) OT Goal: Miscellaneous Goal #2 - Progress: Goal set today  Visit Information  Last OT Received On: 04/23/12 Assistance Needed: +1    Subjective Data  Subjective: I'm a little blurry.  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; poor support system Prior Function Level of Independence: Independent Able to Take Stairs?: Yes Driving: Yes Communication Communication:  (dysarthria) Dominant Hand: Right         Vision/Perception Vision - Assessment Additional Comments: L eyelid puffy   Cognition  Overall Cognitive Status: Appears within functional limits for tasks assessed/performed Arousal/Alertness: Awake/alert Orientation Level: Appears intact for tasks assessed Behavior During Session: [facility] for tasks performed    Extremity/Trunk Assessment Right Upper Extremity Assessment RUE ROM/Strength/Tone: Within functional levels RUE Sensation:  (reports wfls light touch) RUE Coordination:  (decreased handwriting:  still legible) Left Upper Extremity Assessment LUE ROM/Strength/Tone: Deficits (painful at 90--h/o pinched nerve) LUE Sensation:  (numbness present) LUE Coordination: WFL - fine motor     Mobility Bed Mobility Supine to Sit: 7: Independent Transfers Sit to Stand: 5: Supervision     Shoulder Instructions     Exercise     Balance     End of Session OT - End of Session Activity Tolerance: Patient tolerated treatment well Patient left: in bed;with call bell/phone within reach;with bed alarm set  GO     Miranda [name] 04/23/2012, 3:41 PM Miranda [name], OTR/L [phone] 04/23/2012

## 2012-04-23 NOTE — Progress Notes (Signed)
Bilateral:  No evidence of hemodynamically significant internal carotid artery stenosis.   Vertebral artery flow is antegrade.     

## 2012-04-23 NOTE — Progress Notes (Signed)
Inpatient Diabetes Program Recommendations  AACE/ADA: New Consensus Statement on Inpatient Glycemic Control (2013)  Target Ranges:  Prepandial:   less than 140 mg/dL      Peak postprandial:   less than 180 mg/dL (1-2 hours)      Critically ill patients:  140 - 180 mg/dL   Reason for Visit:   Inpatient Diabetes Program Recommendations Correction (SSI): consider adding Novolog SENSITIVE correction scale Ac & HS if CBGs continue greater than 180 mg/dl.  Note:

## 2012-04-23 NOTE — Progress Notes (Signed)
Inpatient Diabetes Program Recommendations  AACE/ADA: New Consensus Statement on Inpatient Glycemic Control (2013)  Target Ranges:  Prepandial:   less than 140 mg/dL      Peak postprandial:   less than 180 mg/dL (1-2 hours)      Critically ill patients:  140 - 180 mg/dL   Reason for Visit: Hyperglycemia  Pt states she takes Lantus 25 units bid instead of tid at home. In looking at previous ED visit, her meds were listed as Lantus 40 units bid.  States she checks blood sugars at home.  Will continue to follow. Discussed with RN.  Thank you.  Ailene Ards, RD, LDN, CDE Inpatient Diabetes [phone]

## 2012-04-23 NOTE — Care Management Note (Signed)
    Page 1 of 2   04/24/2012     5:44:35 PM   CARE MANAGEMENT NOTE 04/24/2012  Patient:  NAYELIS, BONITO   Account Number:  [id number]  Date Initiated:  04/23/2012  Documentation initiated by:  Lanier Clam  Subjective/Objective Assessment:   ADMITTED W/SLURRED SPEECH.STROKE.     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   Anticipated DC Date:  04/24/2012   Anticipated DC Plan:  HOME W HOME HEALTH SERVICES      DC Planning Services  CM consult      Choice offered to / List presented to:  C-1 Patient        HH arranged  HH-3 OT  HH-2 PT  HH-5 SPEECH THERAPY      HH agency  [facility]   Status of service:  Completed, signed off Medicare Important Message given?   (If response is "NO", the following Medicare IM given date fields will be blank) Date Medicare IM given:   Date Additional Medicare IM given:    Discharge Disposition:  HOME W HOME HEALTH SERVICES  Per UR Regulation:  Reviewed for med. necessity/level of care/duration of stay  If discussed at Long Length of Stay Meetings, dates discussed:    Comments:  04/24/12 Hanz Winterhalter RN,BSN NCM [phone] [facility]([place]) AWARE OF D/C,& HH ORDERS,FAXED W/CONFIRMATION.  04/23/12 Gurvir Schrom RN,BSN NCM [phone] [facility] CHOSEN FOR HH.SPOKE TO [facility]) AWARE & FOLLOWING FOR D/C NEEDS. PT-OTPT/patient prefers HH.HHC LIST PROVIDED AS RESOURCE.

## 2012-04-23 NOTE — Consult Note (Addendum)
Referring Physician: Izola Price    Chief Complaint: Stroke  HPI:                                                                                                                                         Miranda [name] is an 57 y.o. female who woke up on Wednesday morning with dysarthric speech. This cleared by Wednesday night.  She woke up again on Thursday morning with dysarthric speech which was concerning to her. She states she had no weakness or sensory changes during these events. She was not on ASA prior to admission. At present time she continues to have dysarthric speech but no aphasia in addition she states her LEFT arm and leg feel as though they have decreased sensation.  She does recall that on Wednesday she had a BP 170/ 1??.   LSN: 04/21/12 tPA Given: No: out of window  Past Medical History  Diagnosis Date  . Hypertension   . Diabetes mellitus   . Asthma   . Depression   . Acid reflux   . Degenerative disc disease     Past Surgical History  Procedure Date  . Knee surgery     family history: Mother: HTN, DM Father: HTN  Social History:  reports that she has been smoking Cigarettes.  She has never used smokeless tobacco. She reports that she does not drink alcohol or use illicit drugs.  Allergies: No Known Allergies  Medications:                                                                                                                           Prior to Admission:  Prescriptions prior to admission  Medication Sig Dispense Refill  . albuterol (PROVENTIL HFA;VENTOLIN HFA) 108 (90 BASE) MCG/ACT inhaler Inhale 2 puffs into the lungs every 6 (six) hours as needed. Shortness of breath      . amLODipine (NORVASC) 10 MG tablet Take 10 mg by mouth every morning.       [name] buPROPion (WELLBUTRIN SR) 150 MG 12 hr tablet Take 150 mg by mouth daily.      . cholecalciferol (VITAMIN D) 1000 UNITS tablet Take 3,000 Units by mouth every morning.      . cloNIDine (CATAPRES) 0.2 MG  tablet Take 0.2 mg by mouth at bedtime.      . cyclobenzaprine (FLEXERIL) 10 MG  tablet Take 10 mg by mouth 3 (three) times daily.      [name] gabapentin (NEURONTIN) 300 MG capsule Take 300 mg by mouth 3 (three) times daily.       . hydrochlorothiazide (HYDRODIURIL) 25 MG tablet Take 25 mg by mouth every morning.       . insulin glargine (LANTUS) 100 UNIT/ML injection Inject 25 Units into the skin 3 (three) times daily.       [name] lisinopril (PRINIVIL,ZESTRIL) 20 MG tablet Take 20 mg by mouth every morning.       . metFORMIN (GLUMETZA) 1000 MG (MOD) 24 hr tablet Take 1,000 mg by mouth 2 (two) times daily with a meal.      . Oxycodone HCl 10 MG TABS Take 10 mg by mouth 3 (three) times daily as needed. pain      . oxymorphone (OPANA) 10 MG tablet Take 20 mg by mouth 3 (three) times daily. pain      . sulindac (CLINORIL) 200 MG tablet Take 200 mg by mouth 2 (two) times daily.      . traMADol (ULTRAM) 50 MG tablet Take 50 mg by mouth every 6 (six) hours as needed. For pain.      . traZODone (DESYREL) 50 MG tablet Take 50 mg by mouth at bedtime.       Scheduled:   . amLODipine  10 mg Oral q morning - 10a  . buPROPion  150 mg Oral Daily  . cholecalciferol  3,000 Units Oral q morning - 10a  . cloNIDine  0.2 mg Oral QHS  . cyclobenzaprine  10 mg Oral TID  . enoxaparin  30 mg Subcutaneous Q24H  . gabapentin  300 mg Oral TID  . hydrochlorothiazide  25 mg Oral q morning - 10a  . insulin glargine  25 Units Subcutaneous TID  . lisinopril  20 mg Oral q morning - 10a  . metFORMIN  1,000 mg Oral BID WC  . sulindac  200 mg Oral BID  . traZODone  50 mg Oral QHS    ROS:                                                                                                                                       History obtained from the patient  General ROS: negative for - chills, fatigue, fever, night sweats, weight gain or weight loss Psychological ROS: negative for - behavioral disorder, hallucinations, memory  difficulties, mood swings or suicidal ideation Ophthalmic ROS: negative for - blurry vision, double vision, eye pain or loss of vision ENT ROS: negative for - epistaxis, nasal discharge, oral lesions, sore throat, tinnitus or vertigo Allergy and Immunology ROS: negative for - hives or itchy/watery eyes Hematological and Lymphatic ROS: negative for - bleeding problems, bruising or swollen lymph nodes Endocrine ROS: negative for - galactorrhea, hair pattern changes, polydipsia/polyuria or temperature intolerance Respiratory ROS: negative for - cough, hemoptysis,  shortness of breath or wheezing Cardiovascular ROS: negative for - chest pain, dyspnea on exertion, edema or irregular heartbeat Gastrointestinal ROS: negative for - abdominal pain, diarrhea, hematemesis, nausea/vomiting or stool incontinence Genito-Urinary ROS: negative for - dysuria, hematuria, incontinence or urinary frequency/urgency Musculoskeletal ROS: negative for - joint swelling or muscular weakness Neurological ROS: as noted in HPI Dermatological ROS: negative for rash and skin lesion changes  Neurologic Examination:                                                                                                      Blood pressure 151/73, pulse 64, temperature 97.7 F (36.5 C), temperature source Oral, resp. rate 18, height 5\' 10"  (1.778 m), weight 98.1 kg (216 lb 4.3 oz), SpO2 96.00%.   Mental Status: Alert, oriented, thought content appropriate.  Speech dysarthric without evidence of aphasia.  Able to follow 3 step commands without difficulty. Cranial Nerves: II: Discs flat bilaterally; Visual fields grossly normal, pupils equal, round, reactive to light and accommodation III,IV, VI: ptosis not present, extra-ocular motions intact bilaterally V,VII: smile asymmetric with right facial droop and decreased NL fold on the right, facial light touch sensation normal bilaterally VIII: hearing normal bilaterally IX,X: gag reflex  present XI: bilateral shoulder shrug XII: midline tongue extension Motor: Right : Upper extremity   5/5    Left:     Upper extremity   5/5  Lower extremity   5/5     Lower extremity   5/5 --No drift noted Tone and bulk:normal tone throughout; no atrophy noted Sensory: Patient feels her left arm and leg have decreased sensation compared to the right.  Deep Tendon Reflexes: 1+ and symmetric throughout, No AJ Plantars: Right: downgoing   Left: downgoing Cerebellar: normal finger-to-nose,  normal heel-to-shin test CV: pulses palpable throughout     Results for orders placed during the hospital encounter of 04/22/12 (from the past 48 hour(s))  CBC WITH DIFFERENTIAL     Status: Abnormal   Collection Time   04/22/12  1:55 PM      Component Value Range Comment   WBC 9.0  4.0 - 10.5 K/uL    RBC 4.64  3.87 - 5.11 MIL/uL    Hemoglobin 13.2  12.0 - 15.0 g/dL    HCT 16.1  09.6 - 04.5 %    MCV 87.3  78.0 - 100.0 fL    MCH 28.4  26.0 - 34.0 pg    MCHC 32.6  30.0 - 36.0 g/dL    RDW 40.9  81.1 - 91.4 %    Platelets 289  150 - 400 K/uL    Neutrophils Relative 39 (*) 43 - 77 %    Neutro Abs 3.5  1.7 - 7.7 K/uL    Lymphocytes Relative 53 (*) 12 - 46 %    Lymphs Abs 4.8 (*) 0.7 - 4.0 K/uL    Monocytes Relative 6  3 - 12 %    Monocytes Absolute 0.6  0.1 - 1.0 K/uL    Eosinophils Relative 2  0 - 5 %    Eosinophils Absolute  0.2  0.0 - 0.7 K/uL    Basophils Relative 0  0 - 1 %    Basophils Absolute 0.0  0.0 - 0.1 K/uL   COMPREHENSIVE METABOLIC PANEL     Status: Abnormal   Collection Time   04/22/12  1:55 PM      Component Value Range Comment   Sodium 137  135 - 145 mEq/L    Potassium 3.1 (*) 3.5 - 5.1 mEq/L    Chloride 98  96 - 112 mEq/L    CO2 29  19 - 32 mEq/L    Glucose, Bld 265 (*) 70 - 99 mg/dL    BUN 19  6 - 23 mg/dL    Creatinine, Ser 1.61  0.50 - 1.10 mg/dL    Calcium 9.7  8.4 - 09.6 mg/dL    Total Protein 7.1  6.0 - 8.3 g/dL    Albumin 3.6  3.5 - 5.2 g/dL    AST 9  0 - 37 U/L     ALT 11  0 - 35 U/L    Alkaline Phosphatase 143 (*) 39 - 117 U/L    Total Bilirubin 0.2 (*) 0.3 - 1.2 mg/dL    GFR calc non Af Amer 73 (*) >90 mL/min    GFR calc Af Amer 84 (*) >90 mL/min   PROTIME-INR     Status: Normal   Collection Time   04/22/12  1:55 PM      Component Value Range Comment   Prothrombin Time 12.6  11.6 - 15.2 seconds    INR 0.95  0.00 - 1.49   APTT     Status: Normal   Collection Time   04/22/12  1:55 PM      Component Value Range Comment   aPTT 31  24 - 37 seconds   TROPONIN I     Status: Normal   Collection Time   04/22/12  1:55 PM      Component Value Range Comment   Troponin I <0.30  <0.30 ng/mL   URINALYSIS, ROUTINE W REFLEX MICROSCOPIC     Status: Abnormal   Collection Time   04/22/12  2:16 PM      Component Value Range Comment   Color, Urine AMBER (*) YELLOW BIOCHEMICALS MAY BE AFFECTED BY COLOR   APPearance CLOUDY (*) CLEAR    Specific Gravity, Urine 1.031 (*) 1.005 - 1.030    pH 5.0  5.0 - 8.0    Glucose, UA >1000 (*) NEGATIVE mg/dL    Hgb urine dipstick NEGATIVE  NEGATIVE    Bilirubin Urine SMALL (*) NEGATIVE    Ketones, ur NEGATIVE  NEGATIVE mg/dL    Protein, ur NEGATIVE  NEGATIVE mg/dL    Urobilinogen, UA 1.0  0.0 - 1.0 mg/dL    Nitrite NEGATIVE  NEGATIVE    Leukocytes, UA NEGATIVE  NEGATIVE   URINE MICROSCOPIC-ADD ON     Status: Abnormal   Collection Time   04/22/12  2:16 PM      Component Value Range Comment   Squamous Epithelial / LPF FEW (*) RARE    Casts HYALINE CASTS (*) NEGATIVE    Crystals CA OXALATE CRYSTALS (*) NEGATIVE   GLUCOSE, CAPILLARY     Status: Abnormal   Collection Time   04/22/12  4:59 PM      Component Value Range Comment   Glucose-Capillary 305 (*) 70 - 99 mg/dL   GLUCOSE, CAPILLARY     Status: Abnormal   Collection Time   04/22/12  9:58 PM      Component Value Range Comment   Glucose-Capillary 196 (*) 70 - 99 mg/dL   LIPID PANEL     Status: Abnormal   Collection Time   04/23/12  5:25 AM      Component Value Range Comment     Cholesterol 176  0 - 200 mg/dL    Triglycerides 71  <161 mg/dL    HDL 37 (*) >09 mg/dL    Total CHOL/HDL Ratio 4.8      VLDL 14  0 - 40 mg/dL    LDL Cholesterol 604 (*) 0 - 99 mg/dL   GLUCOSE, CAPILLARY     Status: Abnormal   Collection Time   04/23/12  7:54 AM      Component Value Range Comment   Glucose-Capillary 152 (*) 70 - 99 mg/dL    Dg Chest 2 View  08/16/979  *RADIOLOGY REPORT*  Clinical Data: Slurred speech.  Stroke.  Weakness.  CHEST - 2 VIEW  Comparison: 04/22/2012.  05/12/2011.  Findings: Low volume chest with left greater than right basilar atelectasis.  This produces crowding of the pulmonary vasculature on the lateral view.  No airspace disease.  No effusion. Cardiopericardial silhouette appears within normal limits.  Trachea midline.  IMPRESSION: Low volume chest.   Original Report Authenticated By: Andreas Newport, M.D.    Ct Head Wo Contrast  04/22/2012  *RADIOLOGY REPORT*  Clinical Data: Right facial droop, right-sided weakness, slurred speech, aphasia  CT HEAD WITHOUT CONTRAST  Technique:  Contiguous axial images were obtained from the base of the skull through the vertex without contrast.  Comparison: None.  Findings: Hypodensity in the left basal ganglia (series 2/image 13), suspicious for acute/subacute lacunar infarct.  No evidence of parenchymal hemorrhage or extra-axial fluid collection. No mass lesion, mass effect, or midline shift.  Subcortical white matter and periventricular small vessel ischemic changes.  The visualized paranasal sinuses are essentially clear. The mastoid air cells are unopacified.  No evidence of calvarial fracture.  IMPRESSION: Hypodensity in the left basal ganglia, suspicious for acute/subacute infarct.  These results were called by telephone on 04/23/2011 at 1436 hours to Dr. Dione Booze, who verbally acknowledged these results.   Original Report Authenticated By: Charline Bills, M.D.    Mr [facility] Wo Contrast  04/22/2012  *RADIOLOGY REPORT*   Clinical Data:  Right-sided facial droop and weakness. Diabetes and hypertension.  MRI HEAD WITHOUT CONTRAST MRA HEAD WITHOUT CONTRAST  Technique:  Multiplanar, multiecho pulse sequences of the brain and surrounding structures were obtained without intravenous contrast. Angiographic images of the head were obtained using MRA technique without contrast.  Comparison:  CT head earlier in the day.  MRI HEAD  Findings:  There is an acute left-sided deep white matter infarct affecting the periventricular white matter, basal ganglia, and posterior limb internal capsule within the left MCA/lenticulostriate artery territory.  This correlates with the CT abnormality.  No associated hemorrhage, mass effect, or midline shift.  Mild premature atrophy.  Chronic microvascular ischemic change affects the periventricular and subcortical white matter, likely sequelae of diabetes and hypertension.  Remote lacunar infarcts affect the subcortical white matter and cerebellum.  Chronic microvascular ischemic changes also in the brainstem.  Normal pituitary and cerebellar tonsils.  Major intracranial vascular structures patent.  No osseous findings.  No acute sinus or mastoid disease. Negative orbits.  IMPRESSION: Acute left lenticulostriate territory infarct without hemorrhage or mass effect.  Mild premature atrophy and chronic microvascular ischemic change.  MRA HEAD  Findings: The internal carotid arteries display mild irregularity in their petrous and cavernous segments without flow limiting stenosis.  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   Supraclinoid internal carotid arteries widely patent.  Mild irregularity at the origin of the A1 and M1 left ACA and MCA vessels.  75% stenosis distal M1 segment left middle cerebral artery, potentially flow reducing, could account for the observed ischemic findings.  Occluded A1 segment right anterior cerebral artery  with both distal anterior cerebrals filling from the left. 50% stenosis M1 segment left MCA.  Moderate irregularity in multiple vessels both right and left MCA M2/M3 segments without  branch occlusion.  Basilar artery patent with vertebrals codominant.  Moderate irregularity proximal PCAs bilaterally.  Severe diminution of flow in the distal right PCA vessels.  No cerebellar branch occlusion.  IMPRESSION: Widespread intracranial atherosclerotic change as described. 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   Original Report Authenticated By: Davonna Belling, M.D.      Dg Chest Portable 1 View  04/22/2012  *RADIOLOGY REPORT*  Clinical Data: Diabetes, hypertension, aphasia  PORTABLE CHEST - 1 VIEW  Comparison: 05/12/2011  Findings: The heart and pulmonary vascularity are within normal limits.  The lungs are clear bilaterally.  No bony abnormality is seen.  IMPRESSION: No acute abnormality noted.   Original Report Authenticated By: Alcide Clever, M.D.    Carotid Doppler and Echo pending  LDL 125 A1c pending  Felicie Morn PA-C [facility] [phone]  04/23/2012, 12:01 PM   Assessment: 57 y.o. 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 Patient was not on antiplatelet or anti hyperlipidemia therapy prior to admission. Given stroke risk factors and MRA findings would start treatment for hyperlipidemia with goal LDL <100 and ASA daily.   Stroke Risk Factors - diabetes mellitus and hypertension  Plan: 1. HgbA1c, fasting lipid panel 2. MRI, MRA  of the brain without contrast 3. PT consult, OT consult, Speech consult 4. Echocardiogram 5. Carotid dopplers 6. Prophylactic therapy-Antiplatelet med: Aspirin - dose 325 daily 7. Risk factor modification 8. Telemetry monitoring 9. Frequent neuro checks  Felicie Morn, PA-C [facility]  I personally participated in this patient's care including clinical assessment and management recommendations.  Venetia Maxon M.D. [facility]

## 2012-04-23 NOTE — Evaluation (Signed)
Physical Therapy Evaluation Patient Details Name: Miranda [name] MRN: [id number] DOB: 06/30/1955 Today's Date: 04/23/2012 Time: 0902-0911 PT Time Calculation (min): 9 min  PT Assessment / Plan / Recommendation Clinical Impression  Pt admitted for stroke work up and found to have acute left lenticulostriate territory infarct without hemorrhage or mass effect on MRI.  Pt would benefit from acute PT services in order to improve safety and independence with gait and begin higher level balance activities.  Pt presents with dysarthria as well as balance deficits and would most benefit from outpatient therapy however pt reports she would rather have HHPT to come to house.    PT Assessment  Patient needs continued PT services    Follow Up Recommendations  Outpatient PT (however pt prefers HHPT)    Does the patient have the potential to tolerate intense rehabilitation      Barriers to Discharge        Equipment Recommendations  None recommended by PT    Recommendations for Other Services  (OT and SLP already ordered)   Frequency Min 3X/week    Precautions / Restrictions Precautions Precautions: Fall Restrictions Weight Bearing Restrictions: No   Pertinent Vitals/Pain No pain      Mobility  Bed Mobility Bed Mobility: Supine to Sit;Sit to Supine Supine to Sit: 7: Independent Sit to Supine: 7: Independent Transfers Transfers: Sit to Stand;Stand to Sit Sit to Stand: 5: Supervision Stand to Sit: 5: Supervision Ambulation/Gait Ambulation/Gait Assistance: 4: Min guard Ambulation Distance (Feet): 100 Feet Assistive device: None Ambulation/Gait Assistance Details: pt with stiff gait and reaching for rail occasionally however no LOB observed, pt reports feeling unsteady, plan to gait train with SPC next visit Gait Pattern: Step-through pattern;Decreased hip/knee flexion - left;Decreased hip/knee flexion - right;Decreased stride length (decreased bil PF) Gait velocity: decreased    Shoulder Instructions     Exercises     PT Diagnosis: Difficulty walking;Other (comment) (poor balance)  PT Problem List: Decreased knowledge of use of DME;Decreased mobility;Decreased balance PT Treatment Interventions: DME instruction;Gait training;Functional mobility training;Therapeutic activities;Therapeutic exercise;Balance training;Neuromuscular re-education;Patient/family education   PT Goals Acute Rehab PT Goals PT Goal Formulation: With patient Time For Goal Achievement: 04/30/12 Potential to Achieve Goals: Good Pt will Ambulate: >150 feet;with modified independence;with least restrictive assistive device PT Goal: Ambulate - Progress: Goal set today Additional Goals Additional Goal #2: Pt will perform SLS for 10 sec on each LE without UE support. PT Goal: Additional Goal #2 - Progress: Goal set today  Visit Information  Last PT Received On: 04/23/12 Assistance Needed: +1    Subjective Data  Subjective: pt emotionally upset after ambulating and crying stating her walking wasn't normal.   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 Prior Function Level of Independence: Independent Communication Communication: No difficulties    Cognition  Overall Cognitive Status: Appears within functional limits for tasks assessed/performed Arousal/Alertness: Awake/alert Orientation Level: Appears intact for tasks assessed Behavior During Session: [facility] for tasks performed    Extremity/Trunk Assessment Right Lower Extremity Assessment RLE ROM/Strength/Tone: Deficits RLE ROM/Strength/Tone Deficits: grossly 3+/5 throughout as pt able to take resistance but not maintain, no major difference in strength found between LEs RLE Sensation: WFL - Light Touch Left Lower  Extremity Assessment LLE ROM/Strength/Tone: Deficits LLE ROM/Strength/Tone Deficits: grossly 3+/5 throughout as pt able to take resistance  but not maintain, no major difference in strength found between LEs LLE Sensation: [facility] - Light Touch   Balance Balance Balance Assessed: Yes Static Standing Balance Static Standing - Balance Support: No upper extremity supported Static Standing - Comment/# of Minutes: pt min/guard for trunk perturbations Single Leg Stance - Right Leg: 5  Single Leg Stance - Left Leg: 3   End of Session PT - End of Session Activity Tolerance: Patient tolerated treatment well Patient left: in bed;with call bell/phone within reach;with bed alarm set  GP     Darshana Curnutt,KATHrine E 04/23/2012, 9:29 AM Pager: [phone]

## 2012-04-23 NOTE — Evaluation (Signed)
Speech Language Pathology Evaluation Patient Details Name: Miranda [name] MRN: [id number] DOB: June 15, 1955 Today's Date: 04/23/2012 Time: 8657-8469 SLP Time Calculation (min): 16 min  Problem List:  Patient Active Problem List  Diagnosis  . Stroke  . Hypokalemia  . Diabetes mellitus  . HTN (hypertension)   Past Medical History:  Past Medical History  Diagnosis Date  . Hypertension   . Diabetes mellitus   . Asthma   . Depression   . Acid reflux   . Degenerative disc disease    Past Surgical History:  Past Surgical History  Procedure Date  . Knee surgery    HPI:  57 yo female adm to [facility] with dysarthria that started the day prior but abated and recurred day admitted.  Left UE sensation impairment noted but pt reports this is baseline.  PMH + for HTN, DM, asthma, DDD, acid reflux, "back injury per pt".  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  Pt denies h/o cva or speech deficits.     Assessment / Plan / Recommendation Clinical Impression  Pt presents with moderate mixed dysarthria (spastic-flaccid) resulting in speech intelligiliby at approx 75% for word to approx 50% sentence/conversation level with imprecise articulation, fast rate of speech and decr breath control.   Pt is amenable to verbal cues to slow rate of speech which increases her intelligiblity significantly.  She acknowledges mild word finding problems.  Completion of divergent naming task revealed 11 animals named in 60 seconds with verbal cue at 30 seconds, and 10 pictures of items named without delays.  SLP did not observe word finding deficits in conversation.  Pt was oriented to person, place, time and situation.    Cognitive/judgement skills appeared intact.  Pt was able to state that previous telemarketer call (SLP witnessed during previous SLE attempt) was likely a scam.  She also acknowleged need to use call bell for assist to prevent fall.   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  Pt agreeable to SLP treatment and goals to maximize pt's speech.  Advised pt to tips to improve speech intelligiblity and tasks to increase word finding skills.  Pt verbalized understanding to information provided.  Will follow while pt at hospital and recommend follow up SLP at next venue.  Thanks for order.      SLP Assessment  Patient needs continued Speech Lanaguage Pathology Services    Follow Up Recommendations  Outpatient SLP;Home health SLP    Frequency and Duration min 2x/week  2 weeks   Pertinent Vitals/Pain Afebrile, decreased   SLP Goals  SLP Goals Potential to Achieve Goals: Fair SLP Goal #1: Pt will demonstrate speech intelligiblity to approx 85% at sentence level with minimal verbal cues to compensatory strategies.  SLP Goal #2: Pt will demonstrate high level problem solving skills for home and financial management with modified independence.  SLP Evaluation Prior Functioning  Type of Home: House Education: 2 years of college- pt had worked at [employer] center prior to disability approx 2 years ago Vocation: On disability (for back issues per pt x2 years)   Cognition  Arousal/Alertness: Awake/alert Orientation Level: Oriented X4 Attention: Sustained Sustained Attention: Appears intact Memory: Appears intact (pt recalled slp attempt to see pt earlier and echo completed) Awareness: Appears intact (informed person on phone she was in hospital) Problem Solving: Impaired (able to state need to call for assist) Problem  Solving Impairment: Verbal basic;Functional complex (? functional compex, see notes below) Safety/Judgment: Impaired Comments: pt informed telemarketer who was trying to get her to purchase a cruise that she was in the hospital, but it took her approx 3 minutes to discontinue call, she reports she was trying to call [employer], ? high level cognitive issues    Comprehension  Auditory Comprehension Overall Auditory Comprehension: Appears within functional limits for tasks assessed Commands: Within Functional Limits Conversation: Complex Visual Recognition/Discrimination Discrimination: Within Function Limits Reading Comprehension Reading Status: Not tested    Expression Expression Primary Mode of Expression: Verbal Verbal Expression Initiation: Impaired (occasion delay in initiation, ? word finding vs dysarthria) Level of Generative/Spontaneous Verbalization: Conversation Repetition: No impairment Naming: No impairment Pragmatics: No impairment Interfering Components: Speech intelligibility Non-Verbal Means of Communication: Not applicable Written Expression Dominant Hand: Right   Oral / Motor Oral Motor/Sensory Function Overall Oral Motor/Sensory Function: Impaired Labial ROM: Within Functional Limits Labial Symmetry: Within Functional Limits Labial Strength: Within Functional Limits Lingual ROM: Within Functional Limits Facial Symmetry: Right droop Facial Strength: Reduced Velum: Within Functional Limits Mandible: Within Functional Limits Motor Speech Overall Motor Speech: Impaired Respiration: Impaired Level of Impairment: Sentence Phonation: Low vocal intensity (decreased breath control with speech) Resonance: Within functional limits Articulation: Impaired Level of Impairment: Word (multisyllabic word, phrase) Intelligibility: Intelligibility reduced Word: 75-100% accurate Phrase: 50-74% accurate Sentence: 50-74% accurate Conversation: 50-74% accurate Motor Planning: Witnin functional limits Motor Speech Errors: Aware Effective Techniques: Slow rate   GO     Donavan Burnet, MS [facility] SLP [phone]

## 2012-04-24 DIAGNOSIS — E876 Hypokalemia: Secondary | ICD-10-CM

## 2012-04-24 LAB — BASIC METABOLIC PANEL
Chloride: 103 mEq/L (ref 96–112)
GFR calc Af Amer: >90 mL/min (ref >90)
Potassium: 3.3 mEq/L — ABNORMAL LOW (ref 3.5–5.1)
Sodium: 141 mEq/L (ref 135–145)

## 2012-04-24 LAB — GLUCOSE, CAPILLARY
Glucose-Capillary: 147 mg/dL — ABNORMAL HIGH (ref 70–99)
Glucose-Capillary: 224 mg/dL — ABNORMAL HIGH (ref 70–99)

## 2012-04-24 MED ORDER — INSULIN GLARGINE 100 UNIT/ML ~~LOC~~ SOLN: 40.0000 [IU] | Freq: Two times a day (BID) | SUBCUTANEOUS | Status: DC

## 2012-04-24 MED ORDER — INSULIN GLARGINE 100 UNIT/ML ~~LOC~~ SOLN: 27.0000 [IU] | Freq: Two times a day (BID) | SUBCUTANEOUS | Status: DC

## 2012-04-24 MED ORDER — SIMVASTATIN 20 MG PO TABS: 20.0000 mg | ORAL_TABLET | Freq: Every evening | ORAL | Status: DC

## 2012-04-24 MED ORDER — ASPIRIN 325 MG PO TBEC: 325.0000 mg | DELAYED_RELEASE_TABLET | Freq: Every day | ORAL | Status: DC

## 2012-04-24 MED ORDER — INSULIN GLARGINE 100 UNIT/ML ~~LOC~~ SOLN
27.0000 [IU] | Freq: Three times a day (TID) | SUBCUTANEOUS | Status: DC
  Administered 2012-04-24: 27 [IU] via SUBCUTANEOUS

## 2012-04-24 MED ORDER — INSULIN GLARGINE 100 UNIT/ML ~~LOC~~ SOLN
15.0000 [IU] | Freq: Once | SUBCUTANEOUS | Status: AC
  Administered 2012-04-24: 15 [IU] via SUBCUTANEOUS

## 2012-04-24 MED ORDER — POTASSIUM CHLORIDE CRYS ER 20 MEQ PO TBCR
60.0000 meq | EXTENDED_RELEASE_TABLET | Freq: Once | ORAL | Status: AC
  Administered 2012-04-24: 60 meq via ORAL
  Filled 2012-04-24: qty 3

## 2012-04-24 MED ORDER — BISACODYL 5 MG PO TBEC: 10.0000 mg | DELAYED_RELEASE_TABLET | Freq: Every day | ORAL | Status: DC | PRN

## 2012-04-24 NOTE — Discharge Summary (Signed)
Physician Discharge Summary  Miranda [name] ZOX:[id number] DOB: 30-Jul-1955 DOA: 04/22/2012  PCP: Dorrene German, MD  Admit date: 04/22/2012 Discharge date: 04/24/2012  Time spent: >39minutes  Recommendations for Outpatient Follow-up:      Follow-up Information    Follow up with AVBUERE,EDWIN A, MD. (in 1-2weeks, call for appt upon discharge)    Contact information:   [address] [place] [address] [phone]       Follow up with Gates Rigg, MD. (in 3-4weeks, call for appt )    Contact information:   [address], [address] [facility] [place] [address] [phone]           Discharge Diagnoses:  Principal Problem:  *Stroke Active Problems:  Hypokalemia  Diabetes mellitus  HTN (hypertension)   Discharge Condition: Improved/stable  Diet recommendation: Low sodium heart healthy  Filed Weights   04/22/12 1835  Weight: 98.1 kg (216 lb 4.3 oz)    History of present illness:  Pt is 57 yo female with history of uncontrolled diabetes, HTN who presents to [facility] ED with main concern of intermittent episodes of slurred speech over the past week. Last episode being the morning of admission. She explains she was in her usual state of health and went to bed only to wake up with slurred speech and bilateral lower extremity weakness, unable to bear weight and having difficulty with ambulation. She reports intermittent episodes of similar symptoms but they usually resolve. She denies chest pain or shortness of breath, no abdominal or urinary concerns, no headaches or visual changes. No fevers, chills, other systemic symptoms.  In ED, CT head indicated acute/subacute left basal ganglia stroke and TRH asked for admission and stroke work up.    Hospital Course:  Stroke/acute L, lenticulostriate - PER mri  - Upon admission a left basal ganglia stroke noted on CT  -She was placed on aspirin, and she is to continue this upon discharge. - A lipid  panel was done and it came back with an LDL of 125, cholesterol of 176. She has been started on Zocor goal is to have LDL less than 100 units she is to followup with her PCP. - PT/OT/SLP evaluated patient and recommended followup services-and patient's preference was for home health services which have been set of per case manager.  -she was maintained on her home antihypertensive regimen and diabetic regimen -Neurology was consulted and followed patient in the hospital, agreed with keeping patient on aspirin. She is to follow up outpatient with Dr Pearlean Brownie.  -Active Problems:  Diabetes mellitus  - Her hemoglobin A1c was checked -11.1, very poor control  - She was maintained on her Lantus, and her Accu-Cheks were monitored and she scored with sliding scale insulin. She is to resume her oral hypoglycemics upon discharge. She is to followup with her PCP for close monitoring of her Accu-Cheks and further adjustment of her medications as clinically appropriate for optimal blood glucose control. HTN (hypertension)  - reasonable control in the hospital.  - continue home medication regimen, followup with her PCP for further monitoring and adjustment of her medications for optimal blood pressure control.  Hypokalemia  - Her potassium was replaced in the hospital   Procedures: Carotid Doppler ultrasound-negative for ICA stenosis  2-D echocardiogram Consultations:  Neurology-Dr. Roseanne Reno  Discharge Exam: Filed Vitals:   04/23/12 1825 04/23/12 2029 04/24/12 0151 04/24/12 0631  BP: 127/73 126/76 110/65 109/55  Pulse: 85 78 62 62  Temp: 98.6 F (37 C) 98.8 F (  37.1 C) 98.8 F (37.1 C) 98.2 F (36.8 C)  TempSrc: Oral Oral Oral Oral  Resp: 18 18 18 18   Height:      Weight:      SpO2: 94% 100% 100% 97%  Exam:  General: in NAD, alert and oriented x3  Cardiovascular: RRR  Respiratory:CTAB  Abdomen: soft+BS, NT/ND  NEURO: R.facial weakness, extremities with nl strength   Discharge  Instructions  Discharge Orders    Future Orders Please Complete By Expires   Diet - low sodium heart healthy      Increase activity slowly          Medication List     As of 04/24/2012  2:36 PM    STOP taking these medications         cyclobenzaprine 10 MG tablet   Commonly known as: FLEXERIL      TAKE these medications         albuterol 108 (90 BASE) MCG/ACT inhaler   Commonly known as: PROVENTIL HFA;VENTOLIN HFA   Inhale 2 puffs into the lungs every 6 (six) hours as needed. Shortness of breath      amLODipine 10 MG tablet   Commonly known as: NORVASC   Take 10 mg by mouth every morning.      aspirin 325 MG EC tablet   Take 1 tablet (325 mg total) by mouth daily.      buPROPion 150 MG 12 hr tablet   Commonly known as: WELLBUTRIN SR   Take 150 mg by mouth daily.      cholecalciferol 1000 UNITS tablet   Commonly known as: VITAMIN D   Take 3,000 Units by mouth every morning.      cloNIDine 0.2 MG tablet   Commonly known as: CATAPRES   Take 0.2 mg by mouth at bedtime.      gabapentin 300 MG capsule   Commonly known as: NEURONTIN   Take 300 mg by mouth 3 (three) times daily.      hydrochlorothiazide 25 MG tablet   Commonly known as: HYDRODIURIL   Take 25 mg by mouth every morning.      insulin glargine 100 UNIT/ML injection   Commonly known as: LANTUS   Inject 40 Units into the skin 2 (two) times daily.      lisinopril 20 MG tablet   Commonly known as: PRINIVIL,ZESTRIL   Take 20 mg by mouth every morning.      metFORMIN 1000 MG (MOD) 24 hr tablet   Commonly known as: GLUMETZA   Take 1,000 mg by mouth 2 (two) times daily with a meal.      Oxycodone HCl 10 MG Tabs   Take 10 mg by mouth 3 (three) times daily as needed. pain      oxymorphone 10 MG tablet   Commonly known as: OPANA   Take 20 mg by mouth 3 (three) times daily. pain      simvastatin 20 MG tablet   Commonly known as: ZOCOR   Take 1 tablet (20 mg total) by mouth every evening.       sulindac 200 MG tablet   Commonly known as: CLINORIL   Take 200 mg by mouth 2 (two) times daily.      traMADol 50 MG tablet   Commonly known as: ULTRAM   Take 50 mg by mouth every 6 (six) hours as needed. For pain.      traZODone 50 MG tablet   Commonly known as: DESYREL   Take  50 mg by mouth at bedtime.        Zocor 20 mg by mouth each bedtime     Follow-up Information    Follow up with Dorrene German, MD. (in 1-2weeks, call for appt upon discharge)    Contact information:   [address] [place] [address] [phone]       Follow up with Gates Rigg, MD. (in 3-4weeks, call for appt )    Contact information:   [address], [address] [facility] [place] [address] [phone]           The results of significant diagnostics from this hospitalization (including imaging, microbiology, ancillary and laboratory) are listed below for reference.    Significant Diagnostic Studies: Dg Chest 2 View  04/22/2012  *RADIOLOGY REPORT*  Clinical Data: Slurred speech.  Stroke.  Weakness.  CHEST - 2 VIEW  Comparison: 04/22/2012.  05/12/2011.  Findings: Low volume chest with left greater than right basilar atelectasis.  This produces crowding of the pulmonary vasculature on the lateral view.  No airspace disease.  No effusion. Cardiopericardial silhouette appears within normal limits.  Trachea midline.  IMPRESSION: Low volume chest.   Original Report Authenticated By: Andreas Newport, M.D.    Ct Head Wo Contrast  04/22/2012  *RADIOLOGY REPORT*  Clinical Data: Right facial droop, right-sided weakness, slurred speech, aphasia  CT HEAD WITHOUT CONTRAST  Technique:  Contiguous axial images were obtained from the base of the skull through the vertex without contrast.  Comparison: None.  Findings: Hypodensity in the left basal ganglia (series 2/image 13), suspicious for acute/subacute lacunar infarct.  No evidence of parenchymal hemorrhage or extra-axial fluid  collection. No mass lesion, mass effect, or midline shift.  Subcortical white matter and periventricular small vessel ischemic changes.  The visualized paranasal sinuses are essentially clear. The mastoid air cells are unopacified.  No evidence of calvarial fracture.  IMPRESSION: Hypodensity in the left basal ganglia, suspicious for acute/subacute infarct.  These results were called by telephone on 04/23/2011 at 1436 hours to Dr. Dione Booze, who verbally acknowledged these results.   Original Report Authenticated By: Charline Bills, M.D.    Mr [facility] Wo Contrast  04/22/2012  *RADIOLOGY REPORT*  Clinical Data:  Right-sided facial droop and weakness. Diabetes and hypertension.  MRI HEAD WITHOUT CONTRAST MRA HEAD WITHOUT CONTRAST  Technique:  Multiplanar, multiecho pulse sequences of the brain and surrounding structures were obtained without intravenous contrast. Angiographic images of the head were obtained using MRA technique without contrast.  Comparison:  CT head earlier in the day.  MRI HEAD  Findings:  There is an acute left-sided deep white matter infarct affecting the periventricular white matter, basal ganglia, and posterior limb internal capsule within the left MCA/lenticulostriate artery territory.  This correlates with the CT abnormality.  No associated hemorrhage, mass effect, or midline shift.  Mild premature atrophy.  Chronic microvascular ischemic change affects the periventricular and subcortical white matter, likely sequelae of diabetes and hypertension.  Remote lacunar infarcts affect the subcortical white matter and cerebellum.  Chronic microvascular ischemic changes also in the brainstem.  Normal pituitary and cerebellar tonsils.  Major intracranial vascular structures patent.  No osseous findings.  No acute sinus or mastoid disease. Negative orbits.  IMPRESSION: Acute left lenticulostriate territory infarct without hemorrhage or mass effect.  Mild premature atrophy and chronic microvascular  ischemic change.  MRA HEAD  Findings: The internal carotid arteries display mild irregularity in their petrous and cavernous segments without flow limiting stenosis.  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   Supraclinoid internal carotid arteries widely patent.  Mild irregularity at the origin of the A1 and M1 left ACA and MCA vessels.  75% stenosis distal M1 segment left middle cerebral artery, potentially flow reducing, could account for the observed ischemic findings.  Occluded A1 segment right anterior cerebral artery with both distal anterior cerebrals filling from the left. 50% stenosis M1 segment left MCA.  Moderate irregularity in multiple vessels both right and left MCA M2/M3 segments without  branch occlusion.  Basilar artery patent with vertebrals codominant.  Moderate irregularity proximal PCAs bilaterally.  Severe diminution of flow in the distal right PCA vessels.  No cerebellar branch occlusion.  IMPRESSION: Widespread intracranial atherosclerotic change as described. 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   Original Report Authenticated By: Davonna Belling, M.D.    Mr Brain Wo Contrast  04/22/2012  *RADIOLOGY REPORT*  Clinical Data:  Right-sided facial droop and weakness. Diabetes and hypertension.  MRI HEAD WITHOUT CONTRAST MRA HEAD WITHOUT CONTRAST  Technique:  Multiplanar, multiecho pulse sequences of the brain and surrounding structures were obtained without intravenous contrast. Angiographic images of the head were obtained using MRA technique without contrast.  Comparison:  CT head earlier in the day.  MRI HEAD  Findings:  There is an acute left-sided deep white matter infarct affecting the periventricular white matter, basal ganglia, and posterior limb internal capsule within the left MCA/lenticulostriate artery  territory.  This correlates with the CT abnormality.  No associated hemorrhage, mass effect, or midline shift.  Mild premature atrophy.  Chronic microvascular ischemic change affects the periventricular and subcortical white matter, likely sequelae of diabetes and hypertension.  Remote lacunar infarcts affect the subcortical white matter and cerebellum.  Chronic microvascular ischemic changes also in the brainstem.  Normal pituitary and cerebellar tonsils.  Major intracranial vascular structures patent.  No osseous findings.  No acute sinus or mastoid disease. Negative orbits.  IMPRESSION: Acute left lenticulostriate territory infarct without hemorrhage or mass effect.  Mild premature atrophy and chronic microvascular ischemic change.  MRA HEAD  Findings: The internal carotid arteries display mild irregularity in their petrous and cavernous segments without flow limiting stenosis.  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   Supraclinoid internal carotid arteries widely patent.  Mild irregularity at the origin of the A1 and M1 left ACA and MCA vessels.  75% stenosis distal M1 segment left middle cerebral artery, potentially flow reducing, could account for the observed ischemic findings.  Occluded A1 segment right anterior cerebral artery with both distal anterior cerebrals filling from the left. 50% stenosis M1 segment left MCA.  Moderate irregularity in multiple vessels both right and left MCA M2/M3 segments without  branch occlusion.  Basilar artery patent with vertebrals codominant.  Moderate irregularity proximal PCAs bilaterally.  Severe diminution of flow in the distal right PCA vessels.  No cerebellar branch occlusion.  IMPRESSION: Widespread intracranial atherosclerotic change as described. 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    Original Report Authenticated By: Davonna Belling, M.D.    Dg Chest Portable 1 View  04/22/2012  *RADIOLOGY REPORT*  Clinical Data: Diabetes, hypertension, aphasia  PORTABLE CHEST - 1 VIEW  Comparison: 05/12/2011  Findings:  The heart and pulmonary vascularity are within normal limits.  The lungs are clear bilaterally.  No bony abnormality is seen.  IMPRESSION: No acute abnormality noted.   Original Report Authenticated By: Alcide Clever, M.D.     Microbiology: No results found for this or any previous visit (from the past 240 hour(s)).   Labs: Basic Metabolic Panel:  Lab 04/24/12 4696 04/22/12 1355  NA 141 137  K 3.3* 3.1*  CL 103 98  CO2 30 29  GLUCOSE 188* 265*  BUN 19 19  CREATININE 0.78 0.88  CALCIUM 9.3 9.7  MG -- --  PHOS -- --   Liver Function Tests:  Lab 04/22/12 1355  AST 9  ALT 11  ALKPHOS 143*  BILITOT 0.2*  PROT 7.1  ALBUMIN 3.6   No results found for this basename: LIPASE:5,AMYLASE:5 in the last 168 hours No results found for this basename: AMMONIA:5 in the last 168 hours CBC:  Lab 04/22/12 1355  WBC 9.0  NEUTROABS 3.5  HGB 13.2  HCT 40.5  MCV 87.3  PLT 289   Cardiac Enzymes:  Lab 04/22/12 1355  CKTOTAL --  CKMB --  CKMBINDEX --  TROPONINI <0.30   BNP: BNP (last 3 results) No results found for this basename: PROBNP:3 in the last 8760 hours CBG:  Lab 04/24/12 1206 04/24/12 0803 04/23/12 2140 04/23/12 1652 04/23/12 1202  GLUCAP 224* 147* 235* 206* 233*       Signed:  Grayson Pfefferle C  [facility] 04/24/2012, 2:24 PM

## 2012-04-30 ENCOUNTER — Encounter (HOSPITAL_COMMUNITY): Payer: Self-pay | Admitting: *Deleted

## 2012-04-30 ENCOUNTER — Emergency Department (INDEPENDENT_AMBULATORY_CARE_PROVIDER_SITE_OTHER)
Admission: EM | Admit: 2012-04-30 | Discharge: 2012-04-30 | Disposition: A | Discharge status: Home / Self Care | Payer: Medicaid Other | Source: Home / Self Care

## 2012-04-30 DIAGNOSIS — I6350 Cerebral infarction due to unspecified occlusion or stenosis of unspecified cerebral artery: Secondary | ICD-10-CM

## 2012-04-30 DIAGNOSIS — F329 Major depressive disorder, single episode, unspecified: Secondary | ICD-10-CM

## 2012-04-30 DIAGNOSIS — Z2089 Contact with and (suspected) exposure to other communicable diseases: Secondary | ICD-10-CM

## 2012-04-30 DIAGNOSIS — Z202 Contact with and (suspected) exposure to infections with a predominantly sexual mode of transmission: Secondary | ICD-10-CM

## 2012-04-30 DIAGNOSIS — I639 Cerebral infarction, unspecified: Secondary | ICD-10-CM

## 2012-04-30 HISTORY — DX: Cerebral infarction, unspecified: I63.9

## 2012-04-30 NOTE — ED Notes (Signed)
States was notified by nurse at nursing home where partner resides that her partner appears to have herpatic lesions.  Patient states she is very anxious and concerned about possibility of having herpes.  States she suffered a CVA 8 days ago.  Pt tearful.  Denies any vaginal discharge or lesions.

## 2012-04-30 NOTE — ED Provider Notes (Signed)
Miranda [name] is a 57 y.o. 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  The patient was told that her boyfriend was recently diagnosed with herpes.  She notes that however throughout her relationship he would have occasional outbreaks of genital sore.  Patient notes that they've had sex for many years in between these outbreaks.  The patient denies any genital sores dysuria abdominal pain or itching herself.  She feels well no fevers or chills.   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  She notes that she is anxious and feels sad.  This feeling started after being discharged from the hospital.  She does have a history of depression and is currently treated with Wellbutrin.  She states that she can get in with her primary care doctor on Monday if needed. She denies any suicidal or homicidal ideation. She states that she feels tearful, lack of energy, and anxious.     PMH reviewed. Diabetes, hypertension, hyperlipidemia, stroke History  Substance Use Topics  . Smoking status: Current Every Day Smoker    Types: Cigarettes  . Smokeless tobacco: Never Used  . Alcohol Use: No   ROS as above Medications reviewed. No current facility-administered medications for this encounter.   Current Outpatient Prescriptions  Medication Sig Dispense Refill  . albuterol (PROVENTIL HFA;VENTOLIN HFA) 108 (90 BASE) MCG/ACT inhaler Inhale 2 puffs into the lungs every 6 (six) hours as needed. Shortness of breath      . amLODipine (NORVASC) 10 MG tablet Take 10 mg by mouth every morning.       [name] aspirin EC 325 MG EC tablet Take 1 tablet (325 mg total) by mouth daily.  30 tablet  0  . buPROPion (WELLBUTRIN SR) 150 MG 12 hr tablet Take 150 mg by mouth daily.      . cholecalciferol (VITAMIN D) 1000 UNITS tablet Take 3,000 Units by mouth every morning.      . cloNIDine (CATAPRES) 0.2 MG tablet Take 0.2 mg by  mouth at bedtime.      . gabapentin (NEURONTIN) 300 MG capsule Take 300 mg by mouth 3 (three) times daily.       . hydrochlorothiazide (HYDRODIURIL) 25 MG tablet Take 25 mg by mouth every morning.       . insulin glargine (LANTUS) 100 UNIT/ML injection Inject 40 Units into the skin 2 (two) times daily.  10 mL    . lisinopril (PRINIVIL,ZESTRIL) 20 MG tablet Take 20 mg by mouth every morning.       . metFORMIN (GLUMETZA) 1000 MG (MOD) 24 hr tablet Take 1,000 mg by mouth 2 (two) times daily with a meal.      . Oxycodone HCl 10 MG TABS Take 10 mg by mouth 3 (three) times daily as needed. pain      . oxymorphone (OPANA) 10 MG tablet Take 20 mg by mouth 3 (three) times daily. pain      . simvastatin (ZOCOR) 20 MG tablet Take 1 tablet (20 mg total) by mouth every evening.  30 tablet  0  . sulindac (CLINORIL) 200 MG tablet Take 200 mg by mouth 2 (two) times daily.      . traMADol (ULTRAM) 50 MG tablet Take 50 mg by mouth every 6 (six) hours as needed. For pain.      . traZODone (DESYREL) 50 MG tablet Take 50 mg by mouth at bedtime.  Exam:  BP 140/80  Pulse 72  Temp 97.1 F (36.2 C) (Oral)  Resp 16  SpO2 96% Gen: Well NAD HEENT: EOMI,  MMM Lungs: CTABL Nl WOB Heart: RRR no MRG Abd: NABS, NT, ND Exts: Non edematous BL  LE, warm and well perfused.  Neuro: Slight right facial droop. Mild dysarthria.  Psych: Alert and oriented. Tearful affect, thought process is linear and goal-directed.  Speech shows mild dysarthria but is otherwise normal.  No SI/HI  No results found for this or any previous visit (from the past 24 hour(s)). No results found.  Assessment and Plan: 57 y.o. female with   1) HSV exposure: We discussed the natural history of HSV.  She has certainly been exposed however she denies any history of outbreaks she likely does not have that or has a very mild form.  Likely she will never require treatment if she does have HSV.  However she would like to know for sure.  She  currently denies any vaginal lesions therefore I do not feel that a vaginal speculum exam is reasonable at this time.  Will order blood antibody levels for HSV 1&2 tonight.   2) depression: Patient has depression and anxiety symptoms following a stroke.  This is quite common. I explained her symptoms and that it is very common following a stroke.  She will continue taking Wellbutrin and followup with her primary care doctor on Monday to discuss starting a SSRI.  We discussed coping mechanisms and I suggested searching a support group for stroke survivors. She expresses understanding and agreement.     Rodolph Bong, MD 04/30/12 [phone]

## 2012-04-30 NOTE — ED Provider Notes (Signed)
Medical screening examination/treatment/procedure(s) were performed by a resident physician and as supervising physician I was immediately available for consultation/collaboration.  Leslee Home, M.D.   Reuben Likes, MD 04/30/12 Rosamaria Lints

## 2012-07-17 ENCOUNTER — Encounter (HOSPITAL_COMMUNITY): Payer: Self-pay | Admitting: Emergency Medicine

## 2012-07-17 ENCOUNTER — Emergency Department (HOSPITAL_COMMUNITY)
Admission: EM | Admit: 2012-07-17 | Discharge: 2012-07-17 | Disposition: A | Discharge status: Home / Self Care | Payer: Medicaid Other | Attending: Emergency Medicine | Admitting: Emergency Medicine

## 2012-07-17 DIAGNOSIS — F17200 Nicotine dependence, unspecified, uncomplicated: Secondary | ICD-10-CM | POA: Insufficient documentation

## 2012-07-17 DIAGNOSIS — Z79899 Other long term (current) drug therapy: Secondary | ICD-10-CM | POA: Insufficient documentation

## 2012-07-17 DIAGNOSIS — Z8673 Personal history of transient ischemic attack (TIA), and cerebral infarction without residual deficits: Secondary | ICD-10-CM | POA: Insufficient documentation

## 2012-07-17 DIAGNOSIS — B351 Tinea unguium: Secondary | ICD-10-CM | POA: Insufficient documentation

## 2012-07-17 DIAGNOSIS — I10 Essential (primary) hypertension: Secondary | ICD-10-CM | POA: Insufficient documentation

## 2012-07-17 DIAGNOSIS — Z7982 Long term (current) use of aspirin: Secondary | ICD-10-CM | POA: Insufficient documentation

## 2012-07-17 DIAGNOSIS — F329 Major depressive disorder, single episode, unspecified: Secondary | ICD-10-CM | POA: Insufficient documentation

## 2012-07-17 DIAGNOSIS — F3289 Other specified depressive episodes: Secondary | ICD-10-CM | POA: Insufficient documentation

## 2012-07-17 DIAGNOSIS — E119 Type 2 diabetes mellitus without complications: Secondary | ICD-10-CM | POA: Insufficient documentation

## 2012-07-17 DIAGNOSIS — J45909 Unspecified asthma, uncomplicated: Secondary | ICD-10-CM | POA: Insufficient documentation

## 2012-07-17 DIAGNOSIS — Z794 Long term (current) use of insulin: Secondary | ICD-10-CM | POA: Insufficient documentation

## 2012-07-17 DIAGNOSIS — IMO0002 Reserved for concepts with insufficient information to code with codable children: Secondary | ICD-10-CM | POA: Insufficient documentation

## 2012-07-17 DIAGNOSIS — K219 Gastro-esophageal reflux disease without esophagitis: Secondary | ICD-10-CM | POA: Insufficient documentation

## 2012-07-17 NOTE — ED Provider Notes (Signed)
History     CSN: [id number]  Arrival date & time 07/17/12  1144   First MD Initiated Contact with Patient 07/17/12 1206      Chief Complaint  Patient presents with  . Toe Pain    Right big toe    (Consider location/radiation/quality/duration/timing/severity/associated sxs/prior treatment) HPI Comments: Patient reports she has had several months of right toe pain.  Pain is constant, throbbing, worse with ambulation.  Pain is unchanged x months.  Pain is 10/10 intensity.  No radiation. Denies any injury to the toe.  Pt states she had the nail removed months ago and when it grew back, it grew back very thick.  Denies weakness or numbness of the toe.  No fevers.  Pt is a diabetic, does not see a podiatrist.  Has not spoken to PCP about the toe.  Takes her home pain medications without relief.    Patient is a 57 y.o. female presenting with toe pain. The history is provided by the patient.  Toe Pain Pertinent negatives include no chills, fever, numbness or weakness.    Past Medical History  Diagnosis Date  . Hypertension   . Diabetes mellitus   . Asthma   . Depression   . Acid reflux   . Degenerative disc disease   . Stroke     04/22/12    Past Surgical History  Procedure Laterality Date  . Knee surgery      No family history on file.  History  Substance Use Topics  . Smoking status: Current Every Day Smoker    Types: Cigarettes  . Smokeless tobacco: Never Used  . Alcohol Use: No    OB History   Grav Para Term Preterm Abortions TAB SAB Ect Mult Living                  Review of Systems  Constitutional: Negative for fever and chills.  Skin: Negative for wound.  Neurological: Negative for weakness and numbness.    Allergies  Review of patient's allergies indicates no known allergies.  Home Medications   Current Outpatient Rx  Name  Route  Sig  Dispense  Refill  . albuterol (PROVENTIL HFA;VENTOLIN HFA) 108 (90 BASE) MCG/ACT inhaler   Inhalation   Inhale 2  puffs into the lungs every 6 (six) hours as needed for wheezing or shortness of breath.          [name] amLODipine (NORVASC) 10 MG tablet   Oral   Take 10 mg by mouth every morning.          [name] aspirin EC 325 MG EC tablet   Oral   Take 1 tablet (325 mg total) by mouth daily.   30 tablet   0   . buPROPion (WELLBUTRIN SR) 150 MG 12 hr tablet   Oral   Take 150 mg by mouth daily.         . cholecalciferol (VITAMIN D) 1000 UNITS tablet   Oral   Take 3,000 Units by mouth every morning.         . cloNIDine (CATAPRES) 0.2 MG tablet   Oral   Take 0.2 mg by mouth at bedtime.         . gabapentin (NEURONTIN) 300 MG capsule   Oral   Take 300 mg by mouth 3 (three) times daily.          . hydrochlorothiazide (HYDRODIURIL) 25 MG tablet   Oral   Take 25 mg by mouth every morning.          [name]  insulin glargine (LANTUS) 100 UNIT/ML injection   Subcutaneous   Inject 40 Units into the skin 2 (two) times daily.   10 mL      . lisinopril (PRINIVIL,ZESTRIL) 20 MG tablet   Oral   Take 20 mg by mouth every morning.          . metFORMIN (GLUMETZA) 1000 MG (MOD) 24 hr tablet   Oral   Take 1,000 mg by mouth 2 (two) times daily with a meal.         . Oxycodone HCl 10 MG TABS   Oral   Take 10 mg by mouth 3 (three) times daily as needed (pain).          [name] oxymorphone (OPANA ER) 20 MG 12 hr tablet   Oral   Take 20 mg by mouth every 12 (twelve) hours.         . simvastatin (ZOCOR) 20 MG tablet   Oral   Take 1 tablet (20 mg total) by mouth every evening.   30 tablet   0   . sulindac (CLINORIL) 200 MG tablet   Oral   Take 200 mg by mouth 2 (two) times daily as needed (Arthritis).          . traMADol (ULTRAM) 50 MG tablet   Oral   Take 50 mg by mouth every 6 (six) hours as needed for pain.          . traZODone (DESYREL) 50 MG tablet   Oral   Take 50 mg by mouth at bedtime.           BP 154/94  Pulse 74  Temp(Src) 98.5 F (36.9 C) (Oral)  Resp 18  SpO2  100%  Physical Exam  Nursing note and vitals reviewed. Constitutional: She appears well-developed and well-nourished. No distress.  HENT:  Head: Normocephalic and atraumatic.  Neck: Neck supple.  Pulmonary/Chest: Effort normal.  Neurological: She is alert.  Skin: She is not diaphoretic.  Right great toe with thickened nail, c/w fungal nail.  Surrounding skin is normal - no erythema, edema, warmth, discharge.  No e/o ingrown toenail.  Toe with sensation intact, nontender.  Full AROM.  Pt ambulates without difficulty.      ED Course  Procedures (including critical care time)  Labs Reviewed - No data to display No results found.   1. Fungal toenail infection     MDM  Afebrile, nontoxic, comfortable appearing pt with diabetic with several months of right great toe pain.  Pain is associated with fungal nail.  There has been no injury, there is no evidence of infection.  There is no break in the skin. No further workup or treatment necessary in ED.  I have advised pt to follow up with podiatry and her PCP for treatment.  As pt will need to be on long term PO therapy for fungal infection, I believe that it is better for her to have this done by her PCP where she can have her LFTs monitored, etc.  Pt was requesting removal of the nail, "laser treatment" of the nail, which I think is unnecessary.  I think that there may be an element of peripheral neuropathy to her pain as well.  Pt placed in postop shoe for comfort.  Discussed diagnosis and treatment plan with patient.  Pt given return precautions.  Pt verbalizes understanding and agrees with plan.     I doubt any other EMC precluding discharge at this time including, but not necessarily  limited to the following:  Bacterial infection including cellulitis and osteomyelitis, fracture        Trixie Dredge, PA-C 07/17/12 1255

## 2012-07-17 NOTE — ED Notes (Signed)
Pt c/o pain to right big toe x 1 month. Pt reports that she has an ingrown toe nail and that she is diabetic.

## 2012-07-17 NOTE — ED Provider Notes (Signed)
Medical screening examination/treatment/procedure(s) were performed by non-physician practitioner and as supervising physician I was immediately available for consultation/collaboration.  Doug Sou, MD 07/17/12 [phone]

## 2013-01-25 ENCOUNTER — Encounter (HOSPITAL_COMMUNITY): Payer: Self-pay | Admitting: Emergency Medicine

## 2013-01-25 ENCOUNTER — Emergency Department (HOSPITAL_COMMUNITY): Payer: Medicaid Other

## 2013-01-25 ENCOUNTER — Emergency Department (HOSPITAL_COMMUNITY)
Admission: EM | Admit: 2013-01-25 | Discharge: 2013-01-25 | Disposition: A | Discharge status: Home / Self Care | Payer: Medicaid Other | Attending: Emergency Medicine | Admitting: Emergency Medicine

## 2013-01-25 DIAGNOSIS — F17200 Nicotine dependence, unspecified, uncomplicated: Secondary | ICD-10-CM | POA: Insufficient documentation

## 2013-01-25 DIAGNOSIS — Y92410 Unspecified street and highway as the place of occurrence of the external cause: Secondary | ICD-10-CM | POA: Insufficient documentation

## 2013-01-25 DIAGNOSIS — S39012A Strain of muscle, fascia and tendon of lower back, initial encounter: Secondary | ICD-10-CM

## 2013-01-25 DIAGNOSIS — F3289 Other specified depressive episodes: Secondary | ICD-10-CM | POA: Insufficient documentation

## 2013-01-25 DIAGNOSIS — K219 Gastro-esophageal reflux disease without esophagitis: Secondary | ICD-10-CM | POA: Insufficient documentation

## 2013-01-25 DIAGNOSIS — Z7982 Long term (current) use of aspirin: Secondary | ICD-10-CM | POA: Insufficient documentation

## 2013-01-25 DIAGNOSIS — Z79899 Other long term (current) drug therapy: Secondary | ICD-10-CM | POA: Insufficient documentation

## 2013-01-25 DIAGNOSIS — Y9389 Activity, other specified: Secondary | ICD-10-CM | POA: Insufficient documentation

## 2013-01-25 DIAGNOSIS — E119 Type 2 diabetes mellitus without complications: Secondary | ICD-10-CM | POA: Insufficient documentation

## 2013-01-25 DIAGNOSIS — I10 Essential (primary) hypertension: Secondary | ICD-10-CM | POA: Insufficient documentation

## 2013-01-25 DIAGNOSIS — S161XXA Strain of muscle, fascia and tendon at neck level, initial encounter: Secondary | ICD-10-CM

## 2013-01-25 DIAGNOSIS — R209 Unspecified disturbances of skin sensation: Secondary | ICD-10-CM | POA: Insufficient documentation

## 2013-01-25 DIAGNOSIS — Z794 Long term (current) use of insulin: Secondary | ICD-10-CM | POA: Insufficient documentation

## 2013-01-25 DIAGNOSIS — S139XXA Sprain of joints and ligaments of unspecified parts of neck, initial encounter: Secondary | ICD-10-CM | POA: Insufficient documentation

## 2013-01-25 DIAGNOSIS — S335XXA Sprain of ligaments of lumbar spine, initial encounter: Secondary | ICD-10-CM | POA: Insufficient documentation

## 2013-01-25 DIAGNOSIS — F329 Major depressive disorder, single episode, unspecified: Secondary | ICD-10-CM | POA: Insufficient documentation

## 2013-01-25 DIAGNOSIS — IMO0002 Reserved for concepts with insufficient information to code with codable children: Secondary | ICD-10-CM | POA: Insufficient documentation

## 2013-01-25 DIAGNOSIS — M25519 Pain in unspecified shoulder: Secondary | ICD-10-CM | POA: Insufficient documentation

## 2013-01-25 MED ORDER — HYDROCODONE-ACETAMINOPHEN 5-325 MG PO TABS: 1.0000 | ORAL_TABLET | Freq: Four times a day (QID) | ORAL | Status: DC | PRN

## 2013-01-25 MED ORDER — IBUPROFEN 800 MG PO TABS: 800.0000 mg | ORAL_TABLET | Freq: Three times a day (TID) | ORAL | Status: DC | PRN
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
## 2013-01-25 NOTE — ED Notes (Signed)
Pt discharged.Vital signs stable and GCS 15.Discharge instructions given. 
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
## 2013-01-26 NOTE — ED Provider Notes (Signed)
Medical screening examination/treatment/procedure(s) were performed by non-physician practitioner and as supervising physician I was immediately available for consultation/collaboration.   Laray Anger, DO 01/26/13 2210

## 2013-08-10 ENCOUNTER — Encounter (HOSPITAL_COMMUNITY): Payer: Self-pay | Admitting: Emergency Medicine

## 2013-08-10 ENCOUNTER — Emergency Department (HOSPITAL_COMMUNITY)
Admission: EM | Admit: 2013-08-10 | Discharge: 2013-08-10 | Disposition: A | Discharge status: Home / Self Care | Payer: Medicaid Other | Attending: Emergency Medicine | Admitting: Emergency Medicine

## 2013-08-10 DIAGNOSIS — F329 Major depressive disorder, single episode, unspecified: Secondary | ICD-10-CM | POA: Insufficient documentation

## 2013-08-10 DIAGNOSIS — K219 Gastro-esophageal reflux disease without esophagitis: Secondary | ICD-10-CM | POA: Insufficient documentation

## 2013-08-10 DIAGNOSIS — R209 Unspecified disturbances of skin sensation: Secondary | ICD-10-CM | POA: Insufficient documentation

## 2013-08-10 DIAGNOSIS — F3289 Other specified depressive episodes: Secondary | ICD-10-CM | POA: Insufficient documentation

## 2013-08-10 DIAGNOSIS — Z79899 Other long term (current) drug therapy: Secondary | ICD-10-CM | POA: Insufficient documentation

## 2013-08-10 DIAGNOSIS — I10 Essential (primary) hypertension: Secondary | ICD-10-CM | POA: Insufficient documentation

## 2013-08-10 DIAGNOSIS — R1084 Generalized abdominal pain: Secondary | ICD-10-CM | POA: Insufficient documentation

## 2013-08-10 DIAGNOSIS — Z8673 Personal history of transient ischemic attack (TIA), and cerebral infarction without residual deficits: Secondary | ICD-10-CM | POA: Insufficient documentation

## 2013-08-10 DIAGNOSIS — IMO0002 Reserved for concepts with insufficient information to code with codable children: Secondary | ICD-10-CM | POA: Insufficient documentation

## 2013-08-10 DIAGNOSIS — R202 Paresthesia of skin: Secondary | ICD-10-CM

## 2013-08-10 DIAGNOSIS — E119 Type 2 diabetes mellitus without complications: Secondary | ICD-10-CM | POA: Insufficient documentation

## 2013-08-10 DIAGNOSIS — F17200 Nicotine dependence, unspecified, uncomplicated: Secondary | ICD-10-CM | POA: Insufficient documentation

## 2013-08-10 DIAGNOSIS — Z794 Long term (current) use of insulin: Secondary | ICD-10-CM | POA: Insufficient documentation

## 2013-08-10 DIAGNOSIS — J45909 Unspecified asthma, uncomplicated: Secondary | ICD-10-CM | POA: Insufficient documentation

## 2013-08-10 DIAGNOSIS — Z791 Long term (current) use of non-steroidal anti-inflammatories (NSAID): Secondary | ICD-10-CM | POA: Insufficient documentation

## 2013-08-10 DIAGNOSIS — K5900 Constipation, unspecified: Secondary | ICD-10-CM | POA: Insufficient documentation

## 2013-08-10 LAB — I-STAT CHEM 8, ED
BUN: 12 mg/dL (ref 6–23)
CREATININE: 0.7 mg/dL (ref 0.50–1.10)
Calcium, Ion: 1.19 mmol/L (ref 1.12–1.23)
Chloride: 102 mEq/L (ref 96–112)
GLUCOSE: 221 mg/dL — AB (ref 70–99)
HEMATOCRIT: 50 % — AB (ref 36.0–46.0)
HEMOGLOBIN: 17 g/dL — AB (ref 12.0–15.0)
Potassium: 3.7 mEq/L (ref 3.7–5.3)
Sodium: 143 mEq/L (ref 137–147)
TCO2: 26 mmol/L (ref 0–100)

## 2013-08-10 NOTE — ED Provider Notes (Signed)
CSN: [id number]     Arrival date & time 08/10/13  1220 History   First MD Initiated Contact with Patient 08/10/13 1416     Chief Complaint  Patient presents with  . Right Facial Tingling   . Abdominal Pain     (Consider location/radiation/quality/duration/timing/severity/associated sxs/prior Treatment) HPI Complains of tingling in her right face upper and lower, onset this morning lasting 30 minutes, resolves spontaneously. She states her blood pressure was this morning. Patient also complains of diffuse abdominal discomfort for one week.. She feels constipated. She denies abdominal pain she is presently hungry. No fever no vomiting no other complaint. She denies chest pain denies shortness of breath no treatment prior to coming here no other associated symptoms. Past Medical History  Diagnosis Date  . Hypertension   . Diabetes mellitus   . Asthma   . Depression   . Acid reflux   . Degenerative disc disease   . Stroke     04/22/12   Past Surgical History  Procedure Laterality Date  . Knee surgery     No family history on file. History  Substance Use Topics  . Smoking status: Current Every Day Smoker    Types: Cigarettes  . Smokeless tobacco: Never Used  . Alcohol Use: No   OB History   Grav Para Term Preterm Abortions TAB SAB Ect Mult Living                 Review of Systems  Gastrointestinal: Positive for constipation.  Neurological:       Tingling in her right face      Allergies  Review of patient's allergies indicates no known allergies.  Home Medications   Prior to Admission medications   Medication Sig Start Date End Date Taking? Authorizing Provider  amLODipine (NORVASC) 10 MG tablet Take 10 mg by mouth every morning.    Yes Historical Provider, MD  cloNIDine (CATAPRES) 0.2 MG tablet Take 0.2 mg by mouth at bedtime.   Yes Historical Provider, MD  gabapentin (NEURONTIN) 300 MG capsule Take 300 mg by mouth 3 (three) times daily.    Yes Historical Provider,  MD  hydrochlorothiazide (HYDRODIURIL) 25 MG tablet Take 25 mg by mouth every morning.    Yes Historical Provider, MD  ibuprofen (ADVIL,MOTRIN) 800 MG tablet Take 1 tablet (800 mg total) by mouth every 8 (eight) hours as needed for pain. 01/25/13  Yes Jamesetta Orleanshristopher W Lawyer, PA-C  insulin glargine (LANTUS) 100 UNIT/ML injection Inject 40 Units into the skin 2 (two) times daily. 04/24/12  Yes Adeline C Viyuoh, MD  lisinopril (PRINIVIL,ZESTRIL) 20 MG tablet Take 20 mg by mouth at bedtime.    Yes Historical Provider, MD  metFORMIN (GLUMETZA) 1000 MG (MOD) 24 hr tablet Take 1,000 mg by mouth 2 (two) times daily with a meal.   Yes Historical Provider, MD  Oxycodone HCl 10 MG TABS Take 10 mg by mouth 3 (three) times daily as needed (pain).    Yes Arie Sabinaatherine E Schinlever, PA-C  oxymorphone (OPANA ER) 20 MG 12 hr tablet Take 20 mg by mouth every 12 (twelve) hours.   Yes Historical Provider, MD  simvastatin (ZOCOR) 20 MG tablet Take 1 tablet (20 mg total) by mouth every evening. 04/24/12  Yes Adeline C Viyuoh, MD  sulindac (CLINORIL) 200 MG tablet Take 200 mg by mouth 2 (two) times daily as needed (Arthritis).    Yes Historical Provider, MD  traMADol (ULTRAM) 50 MG tablet Take 50 mg by mouth every 6 (six)  hours as needed for pain.    Yes Claretha CooperKimberly G Lykins, DO  traZODone (DESYREL) 50 MG tablet Take 50 mg by mouth at bedtime as needed.    Yes Historical Provider, MD   BP 125/72  Pulse 83  Temp(Src) 97.9 F (36.6 C) (Oral)  Resp 16  SpO2 96% Physical Exam  Nursing note and vitals reviewed. Constitutional: She is oriented to person, place, and time. She appears well-developed and well-nourished.  HENT:  Head: Normocephalic and atraumatic.  Eyes: Conjunctivae are normal. Pupils are equal, round, and reactive to light.  Neck: Neck supple. No tracheal deviation present. No thyromegaly present.  Cardiovascular: Normal rate and regular rhythm.   No murmur heard. Pulmonary/Chest: Effort normal and breath sounds  normal.  Abdominal: Soft. Bowel sounds are normal. She exhibits no distension. There is no tenderness.  Musculoskeletal: Normal range of motion. She exhibits no edema and no tenderness.  Neurological: She is alert and oriented to person, place, and time. She has normal reflexes. No cranial nerve deficit. Coordination normal.  Gait normal Romberg normal prior drift normal finger-nose normal DTRs symmetric bilaterally knee jerk ankle jerk and biceps toes are going bilaterally  Skin: Skin is warm and dry. No rash noted.  Psychiatric: She has a normal mood and affect.    ED Course  Procedures (including critical care time) Labs Review Labs Reviewed - No data to display  Imaging Review No results found.   EKG Interpretation None     3:45 PM patient resting comfortably. Remains asymptomatic. Results for orders placed during the hospital encounter of 08/10/13  I-STAT CHEM 8, ED      Result Value Ref Range   Sodium 143  137 - 147 mEq/L   Potassium 3.7  3.7 - 5.3 mEq/L   Chloride 102  96 - 112 mEq/L   BUN 12  6 - 23 mg/dL   Creatinine, Ser 2.950.70  0.50 - 1.10 mg/dL   Glucose, Bld 621221 (*) 70 - 99 mg/dL   Calcium, Ion 3.081.19  6.571.12 - 1.23 mmol/L   TCO2 26  0 - 100 mmol/L   Hemoglobin 17.0 (*) 12.0 - 15.0 g/dL   HCT 84.650.0 (*) 96.236.0 - 95.246.0 %   No results found.  MDM   Final diagnoses:  None    Patient's constipation likely secondary to chronic opioid use. She reports that her Opana has recently been increased by her pain specialist. Plan MiraLax as directed. Followup with Dr.Avbuere Symptoms inconsistent with TIA or cardiac etiology. Diagnosis #1 paresthesias Diagnosis #2 chronic constipation #3 hyperglycemia      Doug SouSam Rissa Turley, MD 08/10/13 1553

## 2013-08-10 NOTE — ED Notes (Signed)
Bed: WA08 Expected date:  Expected time:  Means of arrival:  Comments: EMS/facial tingling

## 2013-08-10 NOTE — ED Notes (Addendum)
Per EMS, pt states she has felt dizzy and has been waking up with tingling in the right side of her face for the past month. Pt states she has also had pain in her right abdomen and constipation for the past week. Pt states her chronic back pain medications was increased last month, which she feels has caused her symptoms. Pt also stopped taking her depression medication 4 months ago and is feeling increasingly hopeless. Denies SI/HI

## 2013-08-10 NOTE — Discharge Instructions (Signed)
Your blood sugar was mildly elevated today at 221. You're likely constipated as result of taking pain medication such as Opana. Take MiraLax as directed for constipation. Call Dr.Avbuere schedule followup appointment . Return if concerned for any reason

## 2014-03-06 ENCOUNTER — Encounter: Payer: Self-pay | Admitting: Internal Medicine

## 2014-05-03 ENCOUNTER — Encounter: Payer: Medicaid Other | Admitting: Internal Medicine

## 2014-05-31 ENCOUNTER — Observation Stay (HOSPITAL_COMMUNITY)
Admission: EM | Admit: 2014-05-31 | Discharge: 2014-05-31 | Disposition: A | Discharge status: Home / Self Care | Payer: Medicaid Other | Attending: Emergency Medicine | Admitting: Emergency Medicine

## 2014-05-31 ENCOUNTER — Emergency Department (HOSPITAL_COMMUNITY): Payer: Medicaid Other

## 2014-05-31 ENCOUNTER — Encounter (HOSPITAL_COMMUNITY): Payer: Self-pay | Admitting: Family Medicine

## 2014-05-31 DIAGNOSIS — I10 Essential (primary) hypertension: Secondary | ICD-10-CM | POA: Insufficient documentation

## 2014-05-31 DIAGNOSIS — F32A Depression, unspecified: Secondary | ICD-10-CM

## 2014-05-31 DIAGNOSIS — F17210 Nicotine dependence, cigarettes, uncomplicated: Secondary | ICD-10-CM | POA: Diagnosis not present

## 2014-05-31 DIAGNOSIS — R531 Weakness: Principal | ICD-10-CM | POA: Insufficient documentation

## 2014-05-31 DIAGNOSIS — F329 Major depressive disorder, single episode, unspecified: Secondary | ICD-10-CM | POA: Insufficient documentation

## 2014-05-31 DIAGNOSIS — Z8673 Personal history of transient ischemic attack (TIA), and cerebral infarction without residual deficits: Secondary | ICD-10-CM | POA: Diagnosis not present

## 2014-05-31 DIAGNOSIS — J45909 Unspecified asthma, uncomplicated: Secondary | ICD-10-CM | POA: Insufficient documentation

## 2014-05-31 DIAGNOSIS — G43909 Migraine, unspecified, not intractable, without status migrainosus: Secondary | ICD-10-CM | POA: Diagnosis not present

## 2014-05-31 DIAGNOSIS — M6289 Other specified disorders of muscle: Secondary | ICD-10-CM

## 2014-05-31 DIAGNOSIS — K219 Gastro-esophageal reflux disease without esophagitis: Secondary | ICD-10-CM | POA: Insufficient documentation

## 2014-05-31 DIAGNOSIS — E119 Type 2 diabetes mellitus without complications: Secondary | ICD-10-CM | POA: Insufficient documentation

## 2014-05-31 DIAGNOSIS — R51 Headache: Secondary | ICD-10-CM

## 2014-05-31 DIAGNOSIS — G459 Transient cerebral ischemic attack, unspecified: Secondary | ICD-10-CM | POA: Diagnosis present

## 2014-05-31 HISTORY — DX: Anxiety disorder, unspecified: F41.9

## 2014-05-31 LAB — COMPREHENSIVE METABOLIC PANEL
ALT: 20 U/L (ref 0–35)
ANION GAP: 14 (ref 5–15)
AST: 17 U/L (ref 0–37)
Albumin: 4.2 g/dL (ref 3.5–5.2)
Alkaline Phosphatase: 161 U/L — ABNORMAL HIGH (ref 39–117)
BUN: 10 mg/dL (ref 6–23)
CALCIUM: 9.4 mg/dL (ref 8.4–10.5)
CO2: 21 mmol/L (ref 19–32)
CREATININE: 0.75 mg/dL (ref 0.50–1.10)
Chloride: 101 mmol/L (ref 96–112)
Glucose, Bld: 275 mg/dL — ABNORMAL HIGH (ref 70–99)
Potassium: 3.6 mmol/L (ref 3.5–5.1)
SODIUM: 136 mmol/L (ref 135–145)
TOTAL PROTEIN: 7.6 g/dL (ref 6.0–8.3)
Total Bilirubin: 0.7 mg/dL (ref 0.3–1.2)

## 2014-05-31 LAB — DIFFERENTIAL
BASOS ABS: 0.1 10*3/uL (ref 0.0–0.1)
Basophils Relative: 1 % (ref 0–1)
Eosinophils Absolute: 0.1 10*3/uL (ref 0.0–0.7)
Eosinophils Relative: 1 % (ref 0–5)
LYMPHS ABS: 3.3 10*3/uL (ref 0.7–4.0)
LYMPHS PCT: 41 % (ref 12–46)
MONO ABS: 0.4 10*3/uL (ref 0.1–1.0)
MONOS PCT: 5 % (ref 3–12)
NEUTROS ABS: 4.1 10*3/uL (ref 1.7–7.7)
Neutrophils Relative %: 52 % (ref 43–77)

## 2014-05-31 LAB — I-STAT TROPONIN, ED: Troponin i, poc: 0 ng/mL (ref 0.00–0.08)

## 2014-05-31 LAB — PROTIME-INR
INR: 0.97 (ref 0.00–1.49)
Prothrombin Time: 13 seconds (ref 11.6–15.2)

## 2014-05-31 LAB — CBC
HCT: 44.6 % (ref 36.0–46.0)
Hemoglobin: 14 g/dL (ref 12.0–15.0)
MCH: 27.2 pg (ref 26.0–34.0)
MCHC: 31.4 g/dL (ref 30.0–36.0)
MCV: 86.6 fL (ref 78.0–100.0)
PLATELETS: 309 10*3/uL (ref 150–400)
RBC: 5.15 MIL/uL — ABNORMAL HIGH (ref 3.87–5.11)
RDW: 13.7 % (ref 11.5–15.5)
WBC: 8 10*3/uL (ref 4.0–10.5)

## 2014-05-31 LAB — APTT: aPTT: 28 seconds (ref 24–37)

## 2014-05-31 LAB — CBG MONITORING, ED: GLUCOSE-CAPILLARY: 259 mg/dL — AB (ref 70–99)

## 2014-05-31 MED ORDER — SIMVASTATIN 20 MG PO TABS
20.0000 mg | ORAL_TABLET | Freq: Every evening | ORAL | Status: DC
  Filled 2014-05-31: qty 1

## 2014-05-31 MED ORDER — KETOROLAC TROMETHAMINE 30 MG/ML IJ SOLN
30.0000 mg | Freq: Once | INTRAMUSCULAR | Status: AC
  Administered 2014-05-31: 30 mg via INTRAVENOUS
  Filled 2014-05-31: qty 1

## 2014-05-31 MED ORDER — METFORMIN HCL ER 500 MG PO TB24
1000.0000 mg | ORAL_TABLET | Freq: Two times a day (BID) | ORAL | Status: DC
  Filled 2014-05-31: qty 2

## 2014-05-31 MED ORDER — TRAZODONE HCL 50 MG PO TABS: 50.0000 mg | ORAL_TABLET | Freq: Every evening | ORAL | Status: DC | PRN

## 2014-05-31 MED ORDER — METOCLOPRAMIDE HCL 5 MG/ML IJ SOLN
10.0000 mg | Freq: Once | INTRAMUSCULAR | Status: AC
  Administered 2014-05-31: 10 mg via INTRAVENOUS
  Filled 2014-05-31: qty 2

## 2014-05-31 MED ORDER — HYDROCHLOROTHIAZIDE 25 MG PO TABS: 25.0000 mg | ORAL_TABLET | Freq: Every morning | ORAL | Status: DC

## 2014-05-31 MED ORDER — OXYCODONE HCL 5 MG PO TABS: 10.0000 mg | ORAL_TABLET | Freq: Three times a day (TID) | ORAL | Status: DC | PRN

## 2014-05-31 MED ORDER — INSULIN ASPART 100 UNIT/ML ~~LOC~~ SOLN: 0.0000 [IU] | Freq: Three times a day (TID) | SUBCUTANEOUS | Status: DC

## 2014-05-31 MED ORDER — INSULIN GLARGINE 100 UNIT/ML ~~LOC~~ SOLN
40.0000 [IU] | Freq: Two times a day (BID) | SUBCUTANEOUS | Status: DC
  Filled 2014-05-31: qty 0.4

## 2014-05-31 MED ORDER — LORAZEPAM 2 MG/ML IJ SOLN
1.0000 mg | Freq: Once | INTRAMUSCULAR | Status: AC
  Administered 2014-05-31: 1 mg via INTRAVENOUS
  Filled 2014-05-31: qty 1

## 2014-05-31 MED ORDER — LISINOPRIL 20 MG PO TABS: 20.0000 mg | ORAL_TABLET | Freq: Every day | ORAL | Status: DC

## 2014-05-31 MED ORDER — AMLODIPINE BESYLATE 5 MG PO TABS: 10.0000 mg | ORAL_TABLET | Freq: Every morning | ORAL | Status: DC

## 2014-05-31 MED ORDER — VITAMIN D 1000 UNITS PO TABS
3000.0000 [IU] | ORAL_TABLET | Freq: Every day | ORAL | Status: DC
  Administered 2014-05-31: 3000 [IU] via ORAL
  Filled 2014-05-31: qty 3

## 2014-05-31 MED ORDER — GABAPENTIN 300 MG PO CAPS: 300.0000 mg | ORAL_CAPSULE | Freq: Three times a day (TID) | ORAL | Status: DC

## 2014-05-31 MED ORDER — CLONIDINE HCL 0.2 MG PO TABS: 0.2000 mg | ORAL_TABLET | Freq: Every day | ORAL | Status: DC
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
## 2014-05-31 NOTE — Discharge Instructions (Signed)
Depression There is no evidence of a stroke. Follow up with your doctor. Return to the ED if you develop new or worsening symptoms. Depression refers to feeling sad, low, down in the dumps, blue, gloomy, or empty. In general, there are two kinds of depression: 1. Normal sadness or normal grief. This kind of depression is one that we all feel from time to time after upsetting life experiences, such as the loss of a job or the ending of a relationship. This kind of depression is considered normal, is short lived, and resolves within a few days to 2 weeks. Depression experienced after the loss of a loved one (bereavement) often lasts longer than 2 weeks but normally gets better with time. 2. Clinical depression. This kind of depression lasts longer than normal sadness or normal grief or interferes with your ability to function at home, at work, and in school. It also interferes with your personal relationships. It affects almost every aspect of your life. Clinical depression is an illness. Symptoms of depression can also be caused by conditions other than those mentioned above, such as:  Physical illness. Some physical illnesses, including underactive thyroid gland (hypothyroidism), severe anemia, specific types of cancer, diabetes, uncontrolled seizures, heart and lung problems, strokes, and chronic pain are commonly associated with symptoms of depression.  Side effects of some prescription medicine. In some people, certain types of medicine can cause symptoms of depression.  Substance abuse. Abuse of alcohol and illicit drugs can cause symptoms of depression. SYMPTOMS Symptoms of normal sadness and normal grief include the following:  Feeling sad or crying for short periods of time.  Not caring about anything (apathy).  Difficulty sleeping or sleeping too much.  No longer able to enjoy the things you used to enjoy.  Desire to be by oneself all the time (social isolation).  Lack of energy or  motivation.  Difficulty concentrating or remembering.  Change in appetite or weight.  Restlessness or agitation. Symptoms of clinical depression include the same symptoms of normal sadness or normal grief and also the following symptoms:  Feeling sad or crying all the time.  Feelings of guilt or worthlessness.  Feelings of hopelessness or helplessness.  Thoughts of suicide or the desire to harm yourself (suicidal ideation).  Loss of touch with reality (psychotic symptoms). Seeing or hearing things that are not real (hallucinations) or having false beliefs about your life or the people around you (delusions and paranoia). DIAGNOSIS  The diagnosis of clinical depression is usually based on how bad the symptoms are and how long they have lasted. Your health care provider will also ask you questions about your medical history and substance use to find out if physical illness, use of prescription medicine, or substance abuse is causing your depression. Your health care provider may also order blood tests. TREATMENT  Often, normal sadness and normal grief do not require treatment. However, sometimes antidepressant medicine is given for bereavement to ease the depressive symptoms until they resolve. The treatment for clinical depression depends on how bad the symptoms are but often includes antidepressant medicine, counseling with a mental health professional, or both. Your health care provider will help to determine what treatment is best for you. Depression caused by physical illness usually goes away with appropriate medical treatment of the illness. If prescription medicine is causing depression, talk with your health care provider about stopping the medicine, decreasing the dose, or changing to another medicine. Depression caused by the abuse of alcohol or illicit drugs goes  away when you stop using these substances. Some adults need professional help in order to stop drinking or using  drugs. SEEK IMMEDIATE MEDICAL CARE IF:  You have thoughts about hurting yourself or others.  You lose touch with reality (have psychotic symptoms).  You are taking medicine for depression and have a serious side effect. FOR MORE INFORMATION  National Alliance on Mental Illness: www.nami.[employer] of Mental Health: http://www.maynard.net/ Document Released: 03/28/2000 Document Revised: 08/15/2013 Document Reviewed: 06/30/2011 [facility] Patient Information 2015 [place], [place]. This information is not intended to replace advice given to you by your health care provider. Make sure you discuss any questions you have with your health care provider.

## 2014-05-31 NOTE — ED Notes (Signed)
MD at bedside. 

## 2014-05-31 NOTE — ED Notes (Signed)
CBG = 259  RN and MD Rancour informed of results.

## 2014-05-31 NOTE — ED Notes (Signed)
Pt will dc in 

## 2014-05-31 NOTE — ED Provider Notes (Signed)
CSN: [id number]     Arrival date & time 05/31/14  1307 History   First MD Initiated Contact with Patient 05/31/14 1502     Chief Complaint  Patient presents with  . Anxiety  . Stroke Symptoms     (Consider location/radiation/quality/duration/timing/severity/associated sxs/prior Treatment) HPI Comments: Patient is tearful and anxious. She states she went to her pain medication doctor to refill her medications and was confronted and told that her counts of medications were off which upset her. she states she's been having difficulty talking over the past one week and had some right-sided facial droop and was dragging her right leg yesterday which has since resolved.  She tried to see her PCP because she was worried to having a stroke but the office was closed so she came to the ED. She denies any chest pain. She denies any shortness of breath. She endorses a gradual onset headache and diffuse abdominal pain. She is worried she was having another stroke and says she is having difficulty talking for the past one week. He also is depressed and would like to speak to a psychiatrist. She has no plan to hurt herself at this time.  The history is provided by the patient. The history is limited by the condition of the patient.    Past Medical History  Diagnosis Date  . Hypertension   . Diabetes mellitus   . Asthma   . Depression   . Acid reflux   . Degenerative disc disease   . Stroke     04/22/12  . Anxiety    Past Surgical History  Procedure Laterality Date  . Knee surgery     History reviewed. No pertinent family history. History  Substance Use Topics  . Smoking status: Current Every Day Smoker    Types: Cigarettes  . Smokeless tobacco: Never Used  . Alcohol Use: No   OB History    No data available     Review of Systems  Constitutional: Positive for activity change and appetite change. Negative for fever.  HENT: Negative for congestion and rhinorrhea.   Respiratory: Negative  for cough, chest tightness and shortness of breath.   Cardiovascular: Negative for chest pain.  Gastrointestinal: Negative for nausea and abdominal pain.  Genitourinary: Negative for dysuria, hematuria, vaginal bleeding and vaginal discharge.  Musculoskeletal: Negative for myalgias and arthralgias.  Skin: Negative for rash.  Neurological: Positive for facial asymmetry, speech difficulty, weakness, numbness and headaches.  A complete 10 system review of systems was obtained and all systems are negative except as noted in the HPI and PMH.      Allergies  Review of patient's allergies indicates no known allergies.  Home Medications   Prior to Admission medications   Medication Sig Start Date End Date Taking? Authorizing Provider  amLODipine (NORVASC) 10 MG tablet Take 10 mg by mouth every morning.    Yes Historical Provider, MD  cholecalciferol (VITAMIN D) 1000 UNITS tablet Take 3,000 Units by mouth daily.   Yes Historical Provider, MD  cloNIDine (CATAPRES) 0.2 MG tablet Take 0.2 mg by mouth at bedtime.   Yes Historical Provider, MD  gabapentin (NEURONTIN) 300 MG capsule Take 300 mg by mouth 3 (three) times daily.    Yes Historical Provider, MD  hydrochlorothiazide (HYDRODIURIL) 25 MG tablet Take 25 mg by mouth every morning.    Yes Historical Provider, MD  insulin glargine (LANTUS) 100 UNIT/ML injection Inject 40 Units into the skin 2 (two) times daily. 04/24/12  Yes Adeline C  Viyuoh, MD  lisinopril (PRINIVIL,ZESTRIL) 20 MG tablet Take 20 mg by mouth at bedtime.    Yes Historical Provider, MD  metFORMIN (GLUMETZA) 1000 MG (MOD) 24 hr tablet Take 1,000 mg by mouth 2 (two) times daily with a meal.   Yes Historical Provider, MD  simvastatin (ZOCOR) 20 MG tablet Take 1 tablet (20 mg total) by mouth every evening. 04/24/12  Yes Adeline C Viyuoh, MD  traZODone (DESYREL) 50 MG tablet Take 50 mg by mouth at bedtime as needed for sleep.    Yes Historical Provider, MD  ibuprofen (ADVIL,MOTRIN) 800 MG  tablet Take 1 tablet (800 mg total) by mouth every 8 (eight) hours as needed for pain. 01/25/13   Jamesetta Orleanshristopher W Lawyer, PA-C  Oxycodone HCl 10 MG TABS Take 10 mg by mouth 3 (three) times daily as needed (pain).     Arie Sabinaatherine E Schinlever, PA-C  oxymorphone (OPANA ER) 20 MG 12 hr tablet Take 20 mg by mouth every 12 (twelve) hours.    Historical Provider, MD   BP 108/55 mmHg  Pulse 98  Temp(Src) 98.4 F (36.9 C) (Oral)  Resp 22  Ht 5\' 10"  (1.778 m)  Wt 231 lb (104.781 kg)  BMI 33.15 kg/m2  SpO2 100% Physical Exam  Constitutional: She is oriented to person, place, and time. She appears well-developed and well-nourished. No distress.  Tearful and anxious  HENT:  Head: Normocephalic and atraumatic.  Mouth/Throat: Oropharynx is clear and moist. No oropharyngeal exudate.  Eyes: Conjunctivae and EOM are normal. Pupils are equal, round, and reactive to light.  Neck: Normal range of motion. Neck supple.  No meningismus.  Cardiovascular: Normal rate, regular rhythm, normal heart sounds and intact distal pulses.   No murmur heard. Pulmonary/Chest: Effort normal and breath sounds normal. No respiratory distress.  Abdominal: Soft. There is no tenderness. There is no rebound and no guarding.  Musculoskeletal: Normal range of motion. She exhibits no edema or tenderness.  Neurological: She is alert and oriented to person, place, and time. No cranial nerve deficit. She exhibits normal muscle tone. Coordination normal.  No ataxia on finger to nose bilaterally. No pronator drift. 5/5 strength throughout. CN 2-12 intact. Negative Romberg. Equal grip strength. Sensation intact. Gait is normal.   Skin: Skin is warm.  Psychiatric: She has a normal mood and affect. Her behavior is normal.  Nursing note and vitals reviewed.   ED Course  Procedures (including critical care time) Labs Review Labs Reviewed  CBC - Abnormal; Notable for the following:    RBC 5.15 (*)    All other components within normal  limits  COMPREHENSIVE METABOLIC PANEL - Abnormal; Notable for the following:    Glucose, Bld 275 (*)    Alkaline Phosphatase 161 (*)    All other components within normal limits  CBG MONITORING, ED - Abnormal; Notable for the following:    Glucose-Capillary 259 (*)    All other components within normal limits  PROTIME-INR  APTT  DIFFERENTIAL  I-STAT TROPOININ, ED  CBG MONITORING, ED    Imaging Review Ct Head (brain) Wo Contrast  05/31/2014   CLINICAL DATA:  Anxiety, stroke symptoms  EXAM: CT HEAD WITHOUT CONTRAST  TECHNIQUE: Contiguous axial images were obtained from the base of the skull through the vertex without intravenous contrast.  COMPARISON:  04/22/2012  FINDINGS: No skull fracture is noted. No intracranial hemorrhage, mass effect or midline shift. No acute cortical infarction. No mass lesion is noted on this unenhanced scan. Paranasal sinuses and mastoid air  cells are unremarkable.  Small lacunar infarct in left lenticulostriate region.  IMPRESSION: No acute intracranial abnormality. Tiny lacunar old infarct in left lenticulostriate region.   Electronically Signed   By: Natasha Mead M.D.   On: 05/31/2014 14:53   Mr Brain Wo Contrast  05/31/2014   CLINICAL DATA:  59 year old female with recent onset slurred speech. Right side droop. Initial encounter.  EXAM: MRI HEAD WITHOUT CONTRAST  TECHNIQUE: Multiplanar, multiecho pulse sequences of the brain and surrounding structures were obtained without intravenous contrast.  COMPARISON:  Head CT without contrast 1431 hr today and earlier.  FINDINGS: Major intracranial vascular flow voids are stable. No restricted diffusion to suggest acute infarction. No midline shift, mass effect, evidence of mass lesion, ventriculomegaly, extra-axial collection or acute intracranial hemorrhage. Cervicomedullary junction and pituitary are within normal limits.  Stable T2 and FLAIR hyperintensity in the cerebral white matter, most confluent in the corona radiata on  the left which resembles chronic lacunar infarct. There has been associated Wallerian degeneration visible through the left deep gray matter and into the brainstem (series 5, image 15 and image 11 today). No cortical encephalomalacia. No new signal abnormality elsewhere.  Visible internal auditory structures appear normal. Stable paranasal sinuses and mastoids. Orbit and scalp soft tissues within normal limits. Stable bone marrow signal.  IMPRESSION: 1.  No acute intracranial abnormality. 2. 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   Electronically Signed   By: Odessa Fleming M.D.   On: 05/31/2014 17:38     EKG Interpretation   Date/Time:  Wednesday May 31 2014 15:29:30 EST Ventricular Rate:  77 PR Interval:  158 QRS Duration: 89 QT Interval:  492 QTC Calculation: 557 R Axis:   -13 Text Interpretation:  Sinus rhythm Abnormal R-wave progression, early  transition Left ventricular hypertrophy Borderline T abnormalities,  lateral leads Prolonged QT interval No significant change was found  Confirmed by Manus Gunning  MD, Mykaylah Ballman [phone]) on 05/31/2014 3:50:18 PM      MDM   Final diagnoses:  Depression  Right sided weakness   Patient is anxious, tachycardic and hypertensive. She is upset and having slurred speech and a facial droop yesterday which she said has resolved. Previous stroke in 2014. He has no chest pain or SOB. '  Code stroke not activated due to last seen normal greater than 1 week ago. No appreciable deficits on exam. CT negative for acute pathology. There is old lacunar infarcts.  Obtain MRI to rule out new CVA.  MRI negative for acute infarct. Patient's neurological exam is nonfocal.  Patient states she had right facial droop and difficulty moving right leg yesterday. Consider TIA.  Last workup in 2014. Patient has not been taking aspirin regularly. Discussed with Dr. Roseanne Reno of neurology who feels patient's  symptoms should be treated as TIA.  Seen by neurologist Dr. Amada Jupiter. He does not feel her symptoms correlate with TIA. He does not think she needs a medical admission. We'll consult psychiatry given her depression and fleeting suicidal thoughts.  Patient seen by behavioral health. She is able to contract for safety. She does not meet inpatient criteria. She denies any suicidal or homicidal thoughts currently.  Her headache has improved. She is tolerating PO. She appears stable for discharge.  Glynn Octave, MD 06/01/14 [phone]

## 2014-05-31 NOTE — ED Notes (Signed)
NAD at this time. Pt is stable and leaving with her boyfriend. 

## 2014-05-31 NOTE — ED Notes (Signed)
Pt left for MRI.

## 2014-06-07 ENCOUNTER — Encounter: Payer: Self-pay | Admitting: Internal Medicine

## 2014-08-13 ENCOUNTER — Encounter (HOSPITAL_COMMUNITY): Payer: Self-pay

## 2014-08-13 ENCOUNTER — Emergency Department (HOSPITAL_COMMUNITY)
Admission: EM | Admit: 2014-08-13 | Discharge: 2014-08-13 | Disposition: A | Discharge status: Home / Self Care | Payer: Medicaid Other | Attending: Emergency Medicine | Admitting: Emergency Medicine

## 2014-08-13 DIAGNOSIS — Y998 Other external cause status: Secondary | ICD-10-CM | POA: Diagnosis not present

## 2014-08-13 DIAGNOSIS — S39012A Strain of muscle, fascia and tendon of lower back, initial encounter: Secondary | ICD-10-CM | POA: Insufficient documentation

## 2014-08-13 DIAGNOSIS — J45909 Unspecified asthma, uncomplicated: Secondary | ICD-10-CM | POA: Diagnosis not present

## 2014-08-13 DIAGNOSIS — Z8673 Personal history of transient ischemic attack (TIA), and cerebral infarction without residual deficits: Secondary | ICD-10-CM | POA: Insufficient documentation

## 2014-08-13 DIAGNOSIS — Z8739 Personal history of other diseases of the musculoskeletal system and connective tissue: Secondary | ICD-10-CM | POA: Diagnosis not present

## 2014-08-13 DIAGNOSIS — Z79899 Other long term (current) drug therapy: Secondary | ICD-10-CM | POA: Insufficient documentation

## 2014-08-13 DIAGNOSIS — Y9389 Activity, other specified: Secondary | ICD-10-CM | POA: Diagnosis not present

## 2014-08-13 DIAGNOSIS — F419 Anxiety disorder, unspecified: Secondary | ICD-10-CM | POA: Insufficient documentation

## 2014-08-13 DIAGNOSIS — Z794 Long term (current) use of insulin: Secondary | ICD-10-CM | POA: Diagnosis not present

## 2014-08-13 DIAGNOSIS — F329 Major depressive disorder, single episode, unspecified: Secondary | ICD-10-CM | POA: Diagnosis not present

## 2014-08-13 DIAGNOSIS — Z8719 Personal history of other diseases of the digestive system: Secondary | ICD-10-CM | POA: Insufficient documentation

## 2014-08-13 DIAGNOSIS — I10 Essential (primary) hypertension: Secondary | ICD-10-CM | POA: Diagnosis not present

## 2014-08-13 DIAGNOSIS — S199XXA Unspecified injury of neck, initial encounter: Secondary | ICD-10-CM | POA: Diagnosis not present

## 2014-08-13 DIAGNOSIS — Z720 Tobacco use: Secondary | ICD-10-CM | POA: Diagnosis not present

## 2014-08-13 DIAGNOSIS — S3992XA Unspecified injury of lower back, initial encounter: Secondary | ICD-10-CM | POA: Diagnosis present

## 2014-08-13 DIAGNOSIS — Y92410 Unspecified street and highway as the place of occurrence of the external cause: Secondary | ICD-10-CM | POA: Diagnosis not present

## 2014-08-13 DIAGNOSIS — E119 Type 2 diabetes mellitus without complications: Secondary | ICD-10-CM | POA: Insufficient documentation

## 2014-08-13 DIAGNOSIS — S46911A Strain of unspecified muscle, fascia and tendon at shoulder and upper arm level, right arm, initial encounter: Secondary | ICD-10-CM | POA: Insufficient documentation

## 2014-08-13 DIAGNOSIS — S46811A Strain of other muscles, fascia and tendons at shoulder and upper arm level, right arm, initial encounter: Secondary | ICD-10-CM

## 2014-08-13 MED ORDER — KETOROLAC TROMETHAMINE 60 MG/2ML IM SOLN
60.0000 mg | Freq: Once | INTRAMUSCULAR | Status: AC
  Administered 2014-08-13: 60 mg via INTRAMUSCULAR
  Filled 2014-08-13: qty 2
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
## 2014-08-13 NOTE — Discharge Instructions (Signed)
Continue to take your pain medication for muscle strain. Refer to attached documents for more information
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
## 2014-10-11 ENCOUNTER — Other Ambulatory Visit (HOSPITAL_COMMUNITY): Payer: Self-pay | Admitting: Psychiatry

## 2014-10-11 ENCOUNTER — Other Ambulatory Visit (HOSPITAL_COMMUNITY): Payer: Self-pay | Admitting: Internal Medicine

## 2014-10-11 ENCOUNTER — Ambulatory Visit (HOSPITAL_COMMUNITY)
Admission: RE | Admit: 2014-10-11 | Discharge: 2014-10-11 | Disposition: A | Discharge status: Home / Self Care | Payer: Medicaid Other | Source: Ambulatory Visit | Attending: Psychiatry | Admitting: Psychiatry

## 2014-10-11 DIAGNOSIS — J209 Acute bronchitis, unspecified: Secondary | ICD-10-CM

## 2014-10-11 DIAGNOSIS — I10 Essential (primary) hypertension: Secondary | ICD-10-CM | POA: Insufficient documentation

## 2014-10-11 DIAGNOSIS — E119 Type 2 diabetes mellitus without complications: Secondary | ICD-10-CM | POA: Diagnosis not present

## 2014-10-11 DIAGNOSIS — J45909 Unspecified asthma, uncomplicated: Secondary | ICD-10-CM | POA: Diagnosis not present

## 2014-10-11 DIAGNOSIS — R05 Cough: Secondary | ICD-10-CM | POA: Insufficient documentation

## 2014-10-11 DIAGNOSIS — M545 Low back pain: Secondary | ICD-10-CM | POA: Diagnosis not present

## 2014-10-11 DIAGNOSIS — Z720 Tobacco use: Secondary | ICD-10-CM | POA: Insufficient documentation

## 2014-10-11 DIAGNOSIS — K219 Gastro-esophageal reflux disease without esophagitis: Secondary | ICD-10-CM | POA: Insufficient documentation

## 2014-12-04 ENCOUNTER — Encounter (HOSPITAL_COMMUNITY): Payer: Self-pay | Admitting: Vascular Surgery

## 2014-12-04 ENCOUNTER — Emergency Department (HOSPITAL_COMMUNITY)
Admission: EM | Admit: 2014-12-04 | Discharge: 2014-12-04 | Disposition: A | Discharge status: Home / Self Care | Payer: Medicaid Other | Attending: Emergency Medicine | Admitting: Emergency Medicine

## 2014-12-04 DIAGNOSIS — E119 Type 2 diabetes mellitus without complications: Secondary | ICD-10-CM | POA: Diagnosis not present

## 2014-12-04 DIAGNOSIS — Z794 Long term (current) use of insulin: Secondary | ICD-10-CM | POA: Insufficient documentation

## 2014-12-04 DIAGNOSIS — F419 Anxiety disorder, unspecified: Secondary | ICD-10-CM | POA: Insufficient documentation

## 2014-12-04 DIAGNOSIS — M25561 Pain in right knee: Secondary | ICD-10-CM | POA: Diagnosis not present

## 2014-12-04 DIAGNOSIS — Z720 Tobacco use: Secondary | ICD-10-CM | POA: Diagnosis not present

## 2014-12-04 DIAGNOSIS — I10 Essential (primary) hypertension: Secondary | ICD-10-CM | POA: Diagnosis not present

## 2014-12-04 DIAGNOSIS — Z8673 Personal history of transient ischemic attack (TIA), and cerebral infarction without residual deficits: Secondary | ICD-10-CM | POA: Diagnosis not present

## 2014-12-04 DIAGNOSIS — Z79899 Other long term (current) drug therapy: Secondary | ICD-10-CM | POA: Diagnosis not present

## 2014-12-04 DIAGNOSIS — J45909 Unspecified asthma, uncomplicated: Secondary | ICD-10-CM | POA: Insufficient documentation

## 2014-12-04 DIAGNOSIS — M545 Low back pain, unspecified: Secondary | ICD-10-CM

## 2014-12-04 DIAGNOSIS — K219 Gastro-esophageal reflux disease without esophagitis: Secondary | ICD-10-CM | POA: Diagnosis not present

## 2014-12-04 DIAGNOSIS — M25562 Pain in left knee: Secondary | ICD-10-CM | POA: Diagnosis not present

## 2014-12-04 NOTE — ED Provider Notes (Signed)
CSN: [id number]     Arrival date & time 12/04/14  2047 History  This chart was scribed for Eyvonne Mechanic, PA-C, working with Vanetta Mulders, MD by Elon Spanner, ED Scribe. This patient was seen in room TR04C/TR04C and the patient's care was started at 10:15 PM.   Chief Complaint  Patient presents with  . Knee Pain  . Back Pain   The history is provided by the patient. No language interpreter was used.   HPI Comments: Miranda [name] is a 59 y.o. female DDD, osteoarthritis who presents to the Emergency Department complaining of constant, moderate bilateral knee pain and lower back pain with no acute changes.  The patient reports a history of chronic knee/back pain and describes this today's complaints as typical of her chronic pain.  She is also followed by an orthopaedist for these complaints and her PCP has intermittently prescribed 7.5-325 Percocet and Opana for pain.  She ran out of her pain medications 1 week ago and has an appointment to be seen in two weeks by her orthopaedist.  Per patient, the primary motivation for coming to the ED was pain control.     Past Medical History  Diagnosis Date  . Hypertension   . Diabetes mellitus   . Asthma   . Depression   . Acid reflux   . Degenerative disc disease   . Stroke     04/22/12  . Anxiety    Past Surgical History  Procedure Laterality Date  . Knee surgery     No family history on file. Social History  Substance Use Topics  . Smoking status: Current Every Day Smoker    Types: Cigarettes  . Smokeless tobacco: Never Used  . Alcohol Use: No   OB History    No data available     Review of Systems  All other systems reviewed and are negative.   Allergies  Review of patient's allergies indicates no known allergies.  Home Medications   Prior to Admission medications   Medication Sig Start Date End Date Taking? Authorizing Provider  amLODipine (NORVASC) 10 MG tablet Take 10 mg by mouth every morning.     Historical  Provider, MD  cholecalciferol (VITAMIN D) 1000 UNITS tablet Take 3,000 Units by mouth daily.    Historical Provider, MD  cloNIDine (CATAPRES) 0.2 MG tablet Take 0.2 mg by mouth at bedtime.    Historical Provider, MD  gabapentin (NEURONTIN) 300 MG capsule Take 300 mg by mouth 3 (three) times daily.     Historical Provider, MD  hydrochlorothiazide (HYDRODIURIL) 25 MG tablet Take 25 mg by mouth every morning.     Historical Provider, MD  ibuprofen (ADVIL,MOTRIN) 800 MG tablet Take 1 tablet (800 mg total) by mouth every 8 (eight) hours as needed for pain. 01/25/13   Christopher Lawyer, PA-C  insulin glargine (LANTUS) 100 UNIT/ML injection Inject 40 Units into the skin 2 (two) times daily. 04/24/12   Kela Millin, MD  lisinopril (PRINIVIL,ZESTRIL) 20 MG tablet Take 20 mg by mouth at bedtime.     Historical Provider, MD  metFORMIN (GLUMETZA) 1000 MG (MOD) 24 hr tablet Take 1,000 mg by mouth 2 (two) times daily with a meal.    Historical Provider, MD  Oxycodone HCl 10 MG TABS Take 10 mg by mouth 3 (three) times daily as needed (pain).     Ruby Cola, PA-C  oxymorphone (OPANA ER) 20 MG 12 hr tablet Take 20 mg by mouth every 12 (twelve) hours.  Historical Provider, MD  simvastatin (ZOCOR) 20 MG tablet Take 1 tablet (20 mg total) by mouth every evening. 04/24/12   Kela Millin, MD  traZODone (DESYREL) 50 MG tablet Take 50 mg by mouth at bedtime as needed for sleep.     Historical Provider, MD   BP 129/81 mmHg  Pulse 82  Temp(Src) 98.2 F (36.8 C) (Oral)  Resp 16  SpO2 98%   Physical Exam  Constitutional: She is oriented to person, place, and time. She appears well-developed and well-nourished. No distress.  HENT:  Head: Normocephalic and atraumatic.  Eyes: Conjunctivae and EOM are normal.  Neck: Neck supple. No tracheal deviation present.  Cardiovascular: Normal rate.   Pulmonary/Chest: Effort normal. No respiratory distress.  Musculoskeletal: Normal range of motion.  External  exam of the knees.  Equal bilateral no obvious swelling or deformities.  NonTTP.  Full active ROM.  Patient ambulates without difficulty.  Back is minimally TTP of bilateral lumbar soft tissues.  No paraspinal tenderness.  Distal sensation intact strength 5/5.    Neurological: She is alert and oriented to person, place, and time.  Skin: Skin is warm and dry.  Psychiatric: She has a normal mood and affect. Her behavior is normal.  Nursing note and vitals reviewed.   ED Course  Procedures (including critical care time)  DIAGNOSTIC STUDIES: Oxygen Saturation is 97% on RA, normal by my interpretation.    COORDINATION OF CARE:  10:23 PM Discussed treatment plan with patient at bedside.  Patient acknowledges and agrees with plan.    Labs Review Labs Reviewed - No data to display  Imaging Review No results found. I have personally reviewed and evaluated these images and lab results as part of my medical decision-making.   EKG Interpretation None      MDM   Final diagnoses:  Arthralgia of both knees  Bilateral low back pain without sciatica    Labs:  Imaging:  Therapeutics:  Assessment/Plan: Pt presents for pain mediation, no new or acute findings. Pt counseled on ED pain policy which she understood. She is encouraged to follow-up with PCP for further eval and management.   Discharge Meds:  I personally performed the services described in this documentation, which was scribed in my presence. The recorded information has been reviewed and is accurate.   Eyvonne Mechanic, PA-C 12/06/14 1404  Vanetta Mulders, MD 12/08/14 [phone]

## 2014-12-04 NOTE — ED Notes (Signed)
Pt reports to the ED for eval of bilateral knee pain and low back pain. She has a hx of osteoarthritis in her knees and back and reports she has been unable to get in with her orthopedist. She reports the pain is worse with weight bearing. Denies any numbness, tingling, paralysis, or bowel or bladder changes. She was taking Percocets 7.5 mgs. Pt A&Ox4, resp e/u, and skin warm and dry.

## 2014-12-04 NOTE — ED Notes (Signed)
Pt stable, ambulatory, states understanding of discharge instructions 

## 2014-12-04 NOTE — Discharge Instructions (Signed)
Please follow-up with her primary care for further evaluation and management °

## 2014-12-07 ENCOUNTER — Emergency Department (INDEPENDENT_AMBULATORY_CARE_PROVIDER_SITE_OTHER)
Admission: EM | Admit: 2014-12-07 | Discharge: 2014-12-07 | Disposition: A | Discharge status: Home / Self Care | Payer: Medicaid Other | Source: Home / Self Care | Attending: Family Medicine | Admitting: Family Medicine

## 2014-12-07 ENCOUNTER — Encounter (HOSPITAL_COMMUNITY): Payer: Self-pay | Admitting: Emergency Medicine

## 2014-12-07 DIAGNOSIS — S61218A Laceration without foreign body of other finger without damage to nail, initial encounter: Secondary | ICD-10-CM | POA: Diagnosis not present

## 2014-12-07 NOTE — ED Provider Notes (Signed)
CSN: [id number]     Arrival date & time 12/07/14  1756 History   First MD Initiated Contact with Patient 12/07/14 1826     No chief complaint on file.  (Consider location/radiation/quality/duration/timing/severity/associated sxs/prior Treatment) HPI Comments: 59 year old female was using a razor blade proximal to 2 hours ago and accidentally cut her right index finger producing a superficial laceration. There was initial bleeding but it is since been controlled. Her last tetanus was less than 5 years according to the patient.   Past Medical History  Diagnosis Date  . Hypertension   . Diabetes mellitus   . Asthma   . Depression   . Acid reflux   . Degenerative disc disease   . Stroke     04/22/12  . Anxiety    Past Surgical History  Procedure Laterality Date  . Knee surgery     No family history on file. Social History  Substance Use Topics  . Smoking status: Current Every Day Smoker    Types: Cigarettes  . Smokeless tobacco: Never Used  . Alcohol Use: No   OB History    No data available     Review of Systems  Constitutional: Negative.   Respiratory: Negative.  Negative for shortness of breath.   Musculoskeletal: Negative.   Skin: Positive for wound.  Neurological: Negative.     Allergies  Review of patient's allergies indicates no known allergies.  Home Medications   Prior to Admission medications   Medication Sig Start Date End Date Taking? Authorizing Provider  amLODipine (NORVASC) 10 MG tablet Take 10 mg by mouth every morning.     Historical Provider, MD  cholecalciferol (VITAMIN D) 1000 UNITS tablet Take 3,000 Units by mouth daily.    Historical Provider, MD  cloNIDine (CATAPRES) 0.2 MG tablet Take 0.2 mg by mouth at bedtime.    Historical Provider, MD  gabapentin (NEURONTIN) 300 MG capsule Take 300 mg by mouth 3 (three) times daily.     Historical Provider, MD  hydrochlorothiazide (HYDRODIURIL) 25 MG tablet Take 25 mg by mouth every morning.      Historical Provider, MD  ibuprofen (ADVIL,MOTRIN) 800 MG tablet Take 1 tablet (800 mg total) by mouth every 8 (eight) hours as needed for pain. 01/25/13   Christopher Lawyer, PA-C  insulin glargine (LANTUS) 100 UNIT/ML injection Inject 40 Units into the skin 2 (two) times daily. 04/24/12   Kela Millin, MD  lisinopril (PRINIVIL,ZESTRIL) 20 MG tablet Take 20 mg by mouth at bedtime.     Historical Provider, MD  metFORMIN (GLUMETZA) 1000 MG (MOD) 24 hr tablet Take 1,000 mg by mouth 2 (two) times daily with a meal.    Historical Provider, MD  Oxycodone HCl 10 MG TABS Take 10 mg by mouth 3 (three) times daily as needed (pain).     Ruby Cola, PA-C  oxymorphone (OPANA ER) 20 MG 12 hr tablet Take 20 mg by mouth every 12 (twelve) hours.    Historical Provider, MD  simvastatin (ZOCOR) 20 MG tablet Take 1 tablet (20 mg total) by mouth every evening. 04/24/12   Kela Millin, MD  traZODone (DESYREL) 50 MG tablet Take 50 mg by mouth at bedtime as needed for sleep.     Historical Provider, MD   Meds Ordered and Administered this Visit  Medications - No data to display  BP 130/84 mmHg  Pulse 90  Temp(Src) 98.2 F (36.8 C) (Oral)  Resp 20  SpO2 95% No data found.   Physical Exam  Constitutional: She is oriented to person, place, and time. She appears well-developed and well-nourished. No distress.  Pulmonary/Chest: Effort normal. No respiratory distress.  Musculoskeletal: Normal range of motion. She exhibits no edema.  Right index finger with full range of motion. Flexion and extension of the DIP is intact.  Neurological: She is alert and oriented to person, place, and time.  Skin: Skin is warm and dry.  The laceration is located over the pulp adjacent to the nail of the right distal phalanx. It is approximately 1 cm in length. Approximately 1-2 mm portion of that laceration will separate. The remaining portion will not separate. The depth involves the dermis only. There is currently no  bleeding.  Psychiatric: She has a normal mood and affect.  Nursing note and vitals reviewed.   ED Course  LACERATION REPAIR Date/Time: 12/07/2014 6:47 PM Performed by: Phineas Real, Jadore Veals Authorized by: Bradd Canary D Consent: Verbal consent obtained. Risks and benefits: risks, benefits and alternatives were discussed Consent given by: patient Patient understanding: patient states understanding of the procedure being performed Patient identity confirmed: verbally with patient Body area: upper extremity Location details: right index finger Laceration length: 1 cm Foreign bodies: no foreign bodies Tendon involvement: none Nerve involvement: none Vascular damage: no Patient sedated: no Irrigation solution: saline Irrigation method: jet lavage Amount of cleaning: standard Debridement: none Degree of undermining: none Skin closure: glue Technique: simple Approximation: close Approximation difficulty: simple Patient tolerance: Patient tolerated the procedure well with no immediate complications   (including critical care time)  Labs Review Labs Reviewed - No data to display  Imaging Review No results found.   Visual Acuity Review  Right Eye Distance:   Left Eye Distance:   Bilateral Distance:    Right Eye Near:   Left Eye Near:    Bilateral Near:         MDM   1. Laceration of finger, index, initial encounter    Keep clean and dry, watch for infection instructions for care of glued lac Return for problems    Hayden Rasmussen, NP 12/07/14 1853

## 2014-12-07 NOTE — Discharge Instructions (Signed)

## 2014-12-07 NOTE — ED Notes (Signed)
Laceration to finger after reaching into a drawer that contained razors

## 2015-04-20 ENCOUNTER — Other Ambulatory Visit: Payer: Self-pay | Admitting: Infectious Disease

## 2015-04-20 ENCOUNTER — Ambulatory Visit
Admission: RE | Admit: 2015-04-20 | Discharge: 2015-04-20 | Disposition: A | Discharge status: Home / Self Care | Payer: No Typology Code available for payment source | Source: Ambulatory Visit | Attending: Infectious Disease | Admitting: Infectious Disease

## 2015-04-20 DIAGNOSIS — R7611 Nonspecific reaction to tuberculin skin test without active tuberculosis: Secondary | ICD-10-CM

## 2015-06-19 ENCOUNTER — Other Ambulatory Visit: Payer: Self-pay

## 2015-06-19 DIAGNOSIS — Z1231 Encounter for screening mammogram for malignant neoplasm of breast: Secondary | ICD-10-CM

## 2015-06-25 ENCOUNTER — Ambulatory Visit: Payer: Self-pay | Admitting: Physical Medicine & Rehabilitation

## 2015-07-04 ENCOUNTER — Ambulatory Visit: Payer: Self-pay

## 2015-08-20 ENCOUNTER — Ambulatory Visit: Payer: Self-pay

## 2015-10-08 ENCOUNTER — Encounter (HOSPITAL_COMMUNITY): Payer: Self-pay | Admitting: Emergency Medicine

## 2015-10-08 ENCOUNTER — Ambulatory Visit (HOSPITAL_COMMUNITY)
Admission: EM | Admit: 2015-10-08 | Discharge: 2015-10-08 | Disposition: A | Discharge status: Home / Self Care | Payer: Medicaid Other | Attending: Family Medicine | Admitting: Family Medicine

## 2015-10-08 DIAGNOSIS — N898 Other specified noninflammatory disorders of vagina: Secondary | ICD-10-CM | POA: Diagnosis present

## 2015-10-08 DIAGNOSIS — N760 Acute vaginitis: Secondary | ICD-10-CM

## 2015-10-08 LAB — POCT PREGNANCY, URINE: Preg Test, Ur: NEGATIVE

## 2015-10-08 MED ORDER — VALACYCLOVIR HCL 1 G PO TABS: 1000.0000 mg | ORAL_TABLET | Freq: Two times a day (BID) | ORAL | Status: DC

## 2015-10-08 NOTE — ED Provider Notes (Signed)
CSN: [id number]     Arrival date & time 10/08/15  1308 History   First MD Initiated Contact with Patient 10/08/15 1345     Chief Complaint  Patient presents with  . Vaginal Discharge  . Abscess   (Consider location/radiation/quality/duration/timing/severity/associated sxs/prior Treatment) Patient is a 60 y.o. female presenting with vaginal discharge. The history is provided by the patient and the spouse.  Vaginal Discharge Quality:  Watery Severity:  Moderate Onset quality:  Gradual Duration:  1 week Progression:  Worsening Chronicity:  New Context: spontaneously   Associated symptoms: dyspareunia, dysuria and rash   Risk factors: immunosuppression   Risk factors comment:  Diabetes under poor control.   Past Medical History  Diagnosis Date  . Hypertension   . Diabetes mellitus   . Asthma   . Depression   . Acid reflux   . Degenerative disc disease   . Stroke (HCC)     04/22/12  . Anxiety    Past Surgical History  Procedure Laterality Date  . Knee surgery     History reviewed. No pertinent family history. Social History  Substance Use Topics  . Smoking status: Current Every Day Smoker    Types: Cigarettes  . Smokeless tobacco: Never Used  . Alcohol Use: No   OB History    No data available     Review of Systems  Constitutional: Negative.   Genitourinary: Positive for dysuria, vaginal discharge, genital sores, pelvic pain and dyspareunia.  All other systems reviewed and are negative.   Allergies  Review of patient's allergies indicates no known allergies.  Home Medications   Prior to Admission medications   Medication Sig Start Date End Date Taking? Authorizing Provider  amLODipine (NORVASC) 10 MG tablet Take 10 mg by mouth every morning.    Yes Historical Provider, MD  cholecalciferol (VITAMIN D) 1000 UNITS tablet Take 3,000 Units by mouth daily.   Yes Historical Provider, MD  cloNIDine (CATAPRES) 0.2 MG tablet Take 0.2 mg by mouth at bedtime.   Yes  Historical Provider, MD  gabapentin (NEURONTIN) 300 MG capsule Take 300 mg by mouth 3 (three) times daily.    Yes Historical Provider, MD  hydrochlorothiazide (HYDRODIURIL) 25 MG tablet Take 25 mg by mouth every morning.    Yes Historical Provider, MD  insulin glargine (LANTUS) 100 UNIT/ML injection Inject 40 Units into the skin 2 (two) times daily. 04/24/12  Yes Adeline C Viyuoh, MD  lisinopril (PRINIVIL,ZESTRIL) 20 MG tablet Take 20 mg by mouth at bedtime.    Yes Historical Provider, MD  metFORMIN (GLUMETZA) 1000 MG (MOD) 24 hr tablet Take 1,000 mg by mouth 2 (two) times daily with a meal.   Yes Historical Provider, MD  traZODone (DESYREL) 50 MG tablet Take 50 mg by mouth at bedtime as needed for sleep.    Yes Historical Provider, MD  ibuprofen (ADVIL,MOTRIN) 800 MG tablet Take 1 tablet (800 mg total) by mouth every 8 (eight) hours as needed for pain. 01/25/13   Charlestine Nighthristopher Lawyer, PA-C  Oxycodone HCl 10 MG TABS Take 10 mg by mouth 3 (three) times daily as needed (pain).     Ruby Colaatherine Schinlever, PA-C  oxymorphone (OPANA ER) 20 MG 12 hr tablet Take 20 mg by mouth every 12 (twelve) hours.    Historical Provider, MD  simvastatin (ZOCOR) 20 MG tablet Take 1 tablet (20 mg total) by mouth every evening. 04/24/12   Kela MillinAdeline C Viyuoh, MD  valACYclovir (VALTREX) 1000 MG tablet Take 1 tablet (1,000 mg total) by  mouth 2 (two) times daily. 10/08/15   Linna HoffJames D Cullen Lahaie, MD   Meds Ordered and Administered this Visit  Medications - No data to display  There were no vitals taken for this visit. No data found.   Physical Exam  Constitutional: She is oriented to person, place, and time. She appears well-developed and well-nourished. No distress.  Abdominal: Soft. Bowel sounds are normal. There is no tenderness.  Genitourinary:    There is tenderness and lesion on the right labia. There is tenderness and lesion on the left labia. No vaginal discharge found.  Neurological: She is alert and oriented to person,  place, and time.  Skin: Skin is warm and dry.  Nursing note and vitals reviewed.   ED Course  Procedures (including critical care time)  Labs Review Labs Reviewed  CERVICOVAGINAL ANCILLARY ONLY - Abnormal; Notable for the following:    Wet Prep (BD Affirm) **POSITIVE for Gardnerella** (*)    All other components within normal limits  HSV CULTURE AND TYPING  HIV ANTIBODY (ROUTINE TESTING)  POCT PREGNANCY, URINE  CERVICOVAGINAL ANCILLARY ONLY    Imaging Review No results found.   Visual Acuity Review  Right Eye Distance:   Left Eye Distance:   Bilateral Distance:    Right Eye Near:   Left Eye Near:    Bilateral Near:         MDM   1. Vaginitis and vulvovaginitis       Linna HoffJames D Harjit Douds, MD 10/10/15 1510

## 2015-10-08 NOTE — ED Notes (Signed)
[facility] a [facility] a vaginal discharge x 1 week and a possible abscess in her perineal area.

## 2015-10-09 LAB — HIV ANTIBODY (ROUTINE TESTING W REFLEX): HIV SCREEN 4TH GENERATION: NONREACTIVE

## 2015-10-09 LAB — CERVICOVAGINAL ANCILLARY ONLY
Chlamydia: NEGATIVE
Neisseria Gonorrhea: NEGATIVE
WET PREP (BD AFFIRM): POSITIVE — AB

## 2015-10-10 LAB — HSV CULTURE AND TYPING

## 2015-10-18 ENCOUNTER — Telehealth (HOSPITAL_COMMUNITY): Payer: Self-pay | Admitting: Emergency Medicine

## 2015-10-18 NOTE — ED Notes (Signed)
Called pt and notified of recent lab results from visit 6/26 Pt ID'd properly... Reports feeling better and sx have subsided including vag d/c and the perineal abscess. (no need to rx Flagyl) Adv pt if sx are not getting better to return  Pt verb understanding Education on safe sex given  Per Dr. 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 Test for gardnerella (bacterial vaginosis) was also positive; would only treat this if vag discharge/irritation have persisted. If still with vaginal symptoms, ok to send rx for metronidazole 500mg  bid x 7d, #14, no refills.  Tests for gonorrhea/chlamydia were negative. LM

## 2015-11-20 ENCOUNTER — Emergency Department (HOSPITAL_COMMUNITY): Payer: Medicaid Other

## 2015-11-20 ENCOUNTER — Inpatient Hospital Stay (HOSPITAL_COMMUNITY)
Admission: EM | Admit: 2015-11-20 | Discharge: 2015-11-22 | DRG: 247 | Disposition: A | Discharge status: Home / Self Care | Payer: Medicaid Other | Attending: Internal Medicine | Admitting: Internal Medicine

## 2015-11-20 ENCOUNTER — Encounter (HOSPITAL_COMMUNITY): Payer: Self-pay | Admitting: Vascular Surgery

## 2015-11-20 DIAGNOSIS — Z794 Long term (current) use of insulin: Secondary | ICD-10-CM

## 2015-11-20 DIAGNOSIS — Z955 Presence of coronary angioplasty implant and graft: Secondary | ICD-10-CM

## 2015-11-20 DIAGNOSIS — E119 Type 2 diabetes mellitus without complications: Secondary | ICD-10-CM

## 2015-11-20 DIAGNOSIS — E1165 Type 2 diabetes mellitus with hyperglycemia: Secondary | ICD-10-CM | POA: Diagnosis present

## 2015-11-20 DIAGNOSIS — G8929 Other chronic pain: Secondary | ICD-10-CM | POA: Diagnosis present

## 2015-11-20 DIAGNOSIS — Z79899 Other long term (current) drug therapy: Secondary | ICD-10-CM

## 2015-11-20 DIAGNOSIS — E785 Hyperlipidemia, unspecified: Secondary | ICD-10-CM | POA: Diagnosis present

## 2015-11-20 DIAGNOSIS — Z8673 Personal history of transient ischemic attack (TIA), and cerebral infarction without residual deficits: Secondary | ICD-10-CM

## 2015-11-20 DIAGNOSIS — Z7982 Long term (current) use of aspirin: Secondary | ICD-10-CM

## 2015-11-20 DIAGNOSIS — R079 Chest pain, unspecified: Secondary | ICD-10-CM

## 2015-11-20 DIAGNOSIS — Z8249 Family history of ischemic heart disease and other diseases of the circulatory system: Secondary | ICD-10-CM

## 2015-11-20 DIAGNOSIS — I10 Essential (primary) hypertension: Secondary | ICD-10-CM | POA: Diagnosis present

## 2015-11-20 DIAGNOSIS — I214 Non-ST elevation (NSTEMI) myocardial infarction: Principal | ICD-10-CM

## 2015-11-20 DIAGNOSIS — I2510 Atherosclerotic heart disease of native coronary artery without angina pectoris: Secondary | ICD-10-CM | POA: Diagnosis present

## 2015-11-20 DIAGNOSIS — J45909 Unspecified asthma, uncomplicated: Secondary | ICD-10-CM | POA: Diagnosis present

## 2015-11-20 DIAGNOSIS — IMO0001 Reserved for inherently not codable concepts without codable children: Secondary | ICD-10-CM | POA: Diagnosis present

## 2015-11-20 DIAGNOSIS — F17210 Nicotine dependence, cigarettes, uncomplicated: Secondary | ICD-10-CM | POA: Diagnosis present

## 2015-11-20 LAB — CBC
HEMATOCRIT: 45.9 % (ref 36.0–46.0)
Hemoglobin: 14.5 g/dL (ref 12.0–15.0)
MCH: 28.2 pg (ref 26.0–34.0)
MCHC: 31.6 g/dL (ref 30.0–36.0)
MCV: 89.1 fL (ref 78.0–100.0)
PLATELETS: 391 10*3/uL (ref 150–400)
RBC: 5.15 MIL/uL — ABNORMAL HIGH (ref 3.87–5.11)
RDW: 13.3 % (ref 11.5–15.5)
WBC: 7.6 10*3/uL (ref 4.0–10.5)

## 2015-11-20 LAB — BASIC METABOLIC PANEL
Anion gap: 10 (ref 5–15)
BUN: 10 mg/dL (ref 6–20)
CALCIUM: 9.3 mg/dL (ref 8.9–10.3)
CO2: 28 mmol/L (ref 22–32)
Chloride: 99 mmol/L — ABNORMAL LOW (ref 101–111)
Creatinine, Ser: 0.98 mg/dL (ref 0.44–1.00)
GFR calc Af Amer: >60 mL/min (ref >60)
Glucose, Bld: 407 mg/dL — ABNORMAL HIGH (ref 65–99)
POTASSIUM: 3.6 mmol/L (ref 3.5–5.1)
SODIUM: 137 mmol/L (ref 135–145)

## 2015-11-20 LAB — I-STAT TROPONIN, ED
TROPONIN I, POC: 0.02 ng/mL (ref 0.00–0.08)
TROPONIN I, POC: 0.02 ng/mL (ref 0.00–0.08)

## 2015-11-20 MED ORDER — MORPHINE SULFATE (PF) 4 MG/ML IV SOLN
4.0000 mg | Freq: Once | INTRAVENOUS | Status: AC
  Administered 2015-11-20: 4 mg via INTRAVENOUS
  Filled 2015-11-20: qty 1

## 2015-11-20 MED ORDER — INSULIN GLARGINE 100 UNIT/ML ~~LOC~~ SOLN
40.0000 [IU] | Freq: Every day | SUBCUTANEOUS | Status: DC
  Administered 2015-11-20: 40 [IU] via SUBCUTANEOUS
  Filled 2015-11-20 (×2): qty 0.4

## 2015-11-20 MED ORDER — ASPIRIN 81 MG PO CHEW
324.0000 mg | CHEWABLE_TABLET | Freq: Once | ORAL | Status: AC
Start: 2015-11-20 — End: 2015-11-20
  Administered 2015-11-20: 324 mg via ORAL
  Filled 2015-11-20: qty 4

## 2015-11-20 MED ORDER — NITROGLYCERIN 0.4 MG SL SUBL
0.4000 mg | SUBLINGUAL_TABLET | SUBLINGUAL | Status: DC | PRN
  Administered 2015-11-20 (×3): 0.4 mg via SUBLINGUAL
  Filled 2015-11-20: qty 1

## 2015-11-20 MED ORDER — SODIUM CHLORIDE 0.9 % IV BOLUS (SEPSIS)
1000.0000 mL | Freq: Once | INTRAVENOUS | Status: AC
  Administered 2015-11-20: 1000 mL via INTRAVENOUS

## 2015-11-20 NOTE — ED Provider Notes (Signed)
[facility]-EMERGENCY DEPT Provider Note   CSN: [id number] Arrival date & time: 11/20/15  1707  First Provider Contact:  None       History   Chief Complaint Chief Complaint  Patient presents with  . Chest Pain    HPI Miranda [name] is a 60 y.o. female.  HPI  This patient with history of CVA, Dm, HTN presents for 2 days of chest pain.  It is exertional.  Associated with SOB.  It is burning and pressure like in nature, center of chest, 10/10 at worst.  It did improve with NTG.    Past Medical History:  Diagnosis Date  . Acid reflux   . Anxiety   . Asthma   . Degenerative disc disease   . Depression   . Diabetes mellitus   . Hypertension   . Stroke [facility])    04/22/12    Patient Active Problem List   Diagnosis Date Noted  . TIA (transient ischemic attack) 05/31/2014  . Stroke (HCC) 04/22/2012  . Hypokalemia 04/22/2012  . Diabetes mellitus (HCC) 04/22/2012  . HTN (hypertension) 04/22/2012    Past Surgical History:  Procedure Laterality Date  . KNEE SURGERY      OB History    No data available       Home Medications    Prior to Admission medications   Medication Sig Start Date End Date Taking? Authorizing Provider  amLODipine (NORVASC) 10 MG tablet Take 10 mg by mouth every morning.     Historical Provider, MD  cholecalciferol (VITAMIN D) 1000 UNITS tablet Take 3,000 Units by mouth daily.    Historical Provider, MD  cloNIDine (CATAPRES) 0.2 MG tablet Take 0.2 mg by mouth at bedtime.    Historical Provider, MD  gabapentin (NEURONTIN) 300 MG capsule Take 300 mg by mouth 3 (three) times daily.     Historical Provider, MD  hydrochlorothiazide (HYDRODIURIL) 25 MG tablet Take 25 mg by mouth every morning.     Historical Provider, MD  ibuprofen (ADVIL,MOTRIN) 800 MG tablet Take 1 tablet (800 mg total) by mouth every 8 (eight) hours as needed for pain. 01/25/13   Christopher Lawyer, PA-C  insulin glargine (LANTUS) 100 UNIT/ML injection Inject 40 Units into the skin 2 (two)  times daily. 04/24/12   Kela Millin, MD  lisinopril (PRINIVIL,ZESTRIL) 20 MG tablet Take 20 mg by mouth at bedtime.     Historical Provider, MD  metFORMIN (GLUMETZA) 1000 MG (MOD) 24 hr tablet Take 1,000 mg by mouth 2 (two) times daily with a meal.    Historical Provider, MD  Oxycodone HCl 10 MG TABS Take 10 mg by mouth 3 (three) times daily as needed (pain).     Ruby Cola, PA-C  oxymorphone (OPANA ER) 20 MG 12 hr tablet Take 20 mg by mouth every 12 (twelve) hours.    Historical Provider, MD  simvastatin (ZOCOR) 20 MG tablet Take 1 tablet (20 mg total) by mouth every evening. 04/24/12   Kela Millin, MD  traZODone (DESYREL) 50 MG tablet Take 50 mg by mouth at bedtime as needed for sleep.     Historical Provider, MD  valACYclovir (VALTREX) 1000 MG tablet Take 1 tablet (1,000 mg total) by mouth 2 (two) times daily. 10/08/15   Linna Hoff, MD    Family History No family history on file.  Social History Social History  Substance Use Topics  . Smoking status: Current Every Day Smoker    Types: Cigarettes  . Smokeless tobacco: Never  Used  . Alcohol use No     Allergies   Review of patient's allergies indicates no known allergies.   Review of Systems Review of Systems  Constitutional: Negative for chills and fever.  HENT: Negative for ear pain and sore throat.   Eyes: Negative for pain and visual disturbance.  Respiratory: Positive for shortness of breath. Negative for cough.   Cardiovascular: Positive for chest pain. Negative for palpitations.  Gastrointestinal: Negative for abdominal pain and vomiting.  Genitourinary: Negative for dysuria and hematuria.  Musculoskeletal: Negative for arthralgias and back pain.  Skin: Negative for color change and rash.  Neurological: Negative for seizures and syncope.  All other systems reviewed and are negative.    Physical Exam Updated Vital Signs BP 158/90 (BP Location: Right Arm)   Pulse 94   Temp 98.2 F (36.8 C)  (Oral)   Resp 16   Ht 5\' 10"  (1.778 m)   Wt 106.6 kg   SpO2 97%   BMI 33.72 kg/m   Physical Exam  Constitutional: She appears well-developed and well-nourished. No distress.  HENT:  Head: Normocephalic and atraumatic.  Eyes: Conjunctivae are normal.  Neck: Neck supple.  Cardiovascular: Normal rate and regular rhythm.   No murmur heard. Pulmonary/Chest: Effort normal and breath sounds normal. No respiratory distress.  Abdominal: Soft. There is no tenderness.  Musculoskeletal: She exhibits no edema.  Neurological: She is alert.  Skin: Skin is warm and dry.  Psychiatric: She has a normal mood and affect.  Nursing note and vitals reviewed.    ED Treatments / Results  Labs (all labs ordered are listed, but only abnormal results are displayed) Labs Reviewed  BASIC METABOLIC PANEL - Abnormal; Notable for the following:       Result Value   Chloride 99 (*)    Glucose, Bld 407 (*)    All other components within normal limits  CBC - Abnormal; Notable for the following:    RBC 5.15 (*)    All other components within normal limits  I-STAT TROPOININ, ED    EKG  EKG Interpretation None       Radiology Dg Chest 2 View  Result Date: 11/20/2015 CLINICAL DATA:  Chest pain and shortness of breath for 2 days. Cough for 1 week. EXAM: CHEST  2 VIEW COMPARISON:  Radiograph 04/20/2015 FINDINGS: The cardiomediastinal contours are normal. Streaky opacities at the left lung base. Pulmonary vasculature is normal. No pleural effusion or pneumothorax. No acute osseous abnormalities are seen. IMPRESSION: Streaky left basilar opacities, favor atelectasis over early pneumonia. Electronically Signed   By: Rubye OaksMelanie  Ehinger M.D.   On: 11/20/2015 18:28    Procedures Procedures (including critical care time)  Medications Ordered in ED Medications - No data to display   Initial Impression / Assessment and Plan / ED Course  I have reviewed the triage vital signs and the nursing  notes.  Pertinent labs & imaging results that were available during my care of the patient were reviewed by me and considered in my medical decision making (see chart for details).  Clinical Course    Patient is moderate risk for ACS given risk factors and story.  EKG non-ischemic here.  Trop negative x1 here.  Other labs unremarkable.  CXR did not show signs of infiltrate.  Doubt aortic dissection or abdominal pathology.  Admitted to medicine for ACS ruleout.  Final Clinical Impressions(s) / ED Diagnoses   Final diagnoses:  None    New Prescriptions New Prescriptions   No  medications on file     Marcelina Morel, MD 11/21/15 1610    Pricilla Loveless, MD 11/22/15 [phone]

## 2015-11-20 NOTE — ED Triage Notes (Signed)
Pt reports to the ED for eval of mid sternal CP and burning x 2 days. Pt reports some associated SOB. Denies any aggravating or relieving factors.

## 2015-11-21 ENCOUNTER — Encounter (HOSPITAL_COMMUNITY): Payer: Self-pay | Admitting: Internal Medicine

## 2015-11-21 ENCOUNTER — Inpatient Hospital Stay (HOSPITAL_COMMUNITY): Payer: Medicaid Other

## 2015-11-21 ENCOUNTER — Encounter (HOSPITAL_COMMUNITY)
Admission: EM | Disposition: A | Discharge status: Home / Self Care | Payer: Self-pay | Source: Home / Self Care | Attending: Internal Medicine

## 2015-11-21 DIAGNOSIS — J45909 Unspecified asthma, uncomplicated: Secondary | ICD-10-CM | POA: Diagnosis present

## 2015-11-21 DIAGNOSIS — R079 Chest pain, unspecified: Secondary | ICD-10-CM | POA: Diagnosis present

## 2015-11-21 DIAGNOSIS — Z794 Long term (current) use of insulin: Secondary | ICD-10-CM | POA: Diagnosis not present

## 2015-11-21 DIAGNOSIS — R7989 Other specified abnormal findings of blood chemistry: Secondary | ICD-10-CM | POA: Diagnosis not present

## 2015-11-21 DIAGNOSIS — E785 Hyperlipidemia, unspecified: Secondary | ICD-10-CM | POA: Diagnosis present

## 2015-11-21 DIAGNOSIS — I10 Essential (primary) hypertension: Secondary | ICD-10-CM | POA: Diagnosis present

## 2015-11-21 DIAGNOSIS — E119 Type 2 diabetes mellitus without complications: Secondary | ICD-10-CM

## 2015-11-21 DIAGNOSIS — Z7982 Long term (current) use of aspirin: Secondary | ICD-10-CM | POA: Diagnosis not present

## 2015-11-21 DIAGNOSIS — I2510 Atherosclerotic heart disease of native coronary artery without angina pectoris: Secondary | ICD-10-CM | POA: Diagnosis present

## 2015-11-21 DIAGNOSIS — F17210 Nicotine dependence, cigarettes, uncomplicated: Secondary | ICD-10-CM | POA: Diagnosis present

## 2015-11-21 DIAGNOSIS — I209 Angina pectoris, unspecified: Secondary | ICD-10-CM | POA: Diagnosis not present

## 2015-11-21 DIAGNOSIS — I214 Non-ST elevation (NSTEMI) myocardial infarction: Secondary | ICD-10-CM

## 2015-11-21 DIAGNOSIS — E1165 Type 2 diabetes mellitus with hyperglycemia: Secondary | ICD-10-CM

## 2015-11-21 DIAGNOSIS — Z8673 Personal history of transient ischemic attack (TIA), and cerebral infarction without residual deficits: Secondary | ICD-10-CM | POA: Diagnosis not present

## 2015-11-21 DIAGNOSIS — G8929 Other chronic pain: Secondary | ICD-10-CM | POA: Diagnosis present

## 2015-11-21 DIAGNOSIS — IMO0001 Reserved for inherently not codable concepts without codable children: Secondary | ICD-10-CM | POA: Diagnosis present

## 2015-11-21 DIAGNOSIS — Z79899 Other long term (current) drug therapy: Secondary | ICD-10-CM | POA: Diagnosis not present

## 2015-11-21 DIAGNOSIS — Z8249 Family history of ischemic heart disease and other diseases of the circulatory system: Secondary | ICD-10-CM | POA: Diagnosis not present

## 2015-11-21 HISTORY — PX: CARDIAC CATHETERIZATION: SHX172

## 2015-11-21 LAB — GLUCOSE, CAPILLARY
GLUCOSE-CAPILLARY: 307 mg/dL — AB (ref 65–99)
GLUCOSE-CAPILLARY: 164 mg/dL — AB (ref 65–99)
Glucose-Capillary: 162 mg/dL — ABNORMAL HIGH (ref 65–99)
Glucose-Capillary: 149 mg/dL — ABNORMAL HIGH (ref 65–99)

## 2015-11-21 LAB — LIPID PANEL
Cholesterol: 210 mg/dL — ABNORMAL HIGH (ref 0–200)
HDL: 33 mg/dL — ABNORMAL LOW (ref >40)
LDL CALC: 158 mg/dL — AB (ref 0–99)
Total CHOL/HDL Ratio: 6.4 RATIO
Triglycerides: 95 mg/dL (ref <150)
VLDL: 19 mg/dL (ref 0–40)

## 2015-11-21 LAB — CBC
HCT: 40.9 % (ref 36.0–46.0)
HEMATOCRIT: 41.4 % (ref 36.0–46.0)
HEMOGLOBIN: 12.7 g/dL (ref 12.0–15.0)
Hemoglobin: 12.8 g/dL (ref 12.0–15.0)
MCH: 27.9 pg (ref 26.0–34.0)
MCH: 27.7 pg (ref 26.0–34.0)
MCHC: 31.3 g/dL (ref 30.0–36.0)
MCHC: 30.7 g/dL (ref 30.0–36.0)
MCV: 89.1 fL (ref 78.0–100.0)
MCV: 90.2 fL (ref 78.0–100.0)
PLATELETS: 350 10*3/uL (ref 150–400)
Platelets: 332 10*3/uL (ref 150–400)
RBC: 4.59 MIL/uL (ref 3.87–5.11)
RBC: 4.59 MIL/uL (ref 3.87–5.11)
RDW: 13.5 % (ref 11.5–15.5)
RDW: 13.7 % (ref 11.5–15.5)
WBC: 10.6 10*3/uL — AB (ref 4.0–10.5)
WBC: 9.3 10*3/uL (ref 4.0–10.5)

## 2015-11-21 LAB — D-DIMER, QUANTITATIVE: D-Dimer, Quant: 0.28 ug/mL-FEU (ref 0.00–0.50)

## 2015-11-21 LAB — CREATININE, SERUM
CREATININE: 0.83 mg/dL (ref 0.44–1.00)
Creatinine, Ser: 0.68 mg/dL (ref 0.44–1.00)
GFR calc non Af Amer: >60 mL/min (ref >60)

## 2015-11-21 LAB — D-DIMER, QUANTITATIVE (NOT AT ARMC): D DIMER QUANT: 0.29 ug{FEU}/mL (ref 0.00–0.50)

## 2015-11-21 LAB — TROPONIN I
Troponin I: 0.07 ng/mL (ref <0.03)
Troponin I: 0.08 ng/mL (ref <0.03)
Troponin I: 0.23 ng/mL (ref <0.03)

## 2015-11-21 LAB — ECHOCARDIOGRAM COMPLETE
HEIGHTINCHES: 70 in
Weight: 3860.8 oz

## 2015-11-21 LAB — CBG MONITORING, ED: Glucose-Capillary: 316 mg/dL — ABNORMAL HIGH (ref 65–99)

## 2015-11-21 LAB — PROCALCITONIN

## 2015-11-21 LAB — POCT ACTIVATED CLOTTING TIME: Activated Clotting Time: 488 seconds

## 2015-11-21 LAB — PROTIME-INR
INR: 0.93
PROTHROMBIN TIME: 12.5 s (ref 11.4–15.2)

## 2015-11-21 SURGERY — LEFT HEART CATH AND CORONARY ANGIOGRAPHY
Anesthesia: LOCAL

## 2015-11-21 MED ORDER — IOPAMIDOL (ISOVUE-370) INJECTION 76%
INTRAVENOUS | Status: DC | PRN
  Administered 2015-11-21: 175 mL via INTRA_ARTERIAL

## 2015-11-21 MED ORDER — LINAGLIPTIN 5 MG PO TABS
5.0000 mg | ORAL_TABLET | Freq: Every day | ORAL | Status: DC
  Administered 2015-11-22: 5 mg via ORAL
  Filled 2015-11-21: qty 1

## 2015-11-21 MED ORDER — INSULIN GLARGINE 100 UNIT/ML ~~LOC~~ SOLN
50.0000 [IU] | Freq: Two times a day (BID) | SUBCUTANEOUS | Status: DC
  Administered 2015-11-21 – 2015-11-22 (×3): 50 [IU] via SUBCUTANEOUS
  Filled 2015-11-21 (×4): qty 0.5

## 2015-11-21 MED ORDER — BIVALIRUDIN 250 MG IV SOLR
INTRAVENOUS | Status: AC
  Filled 2015-11-21: qty 250

## 2015-11-21 MED ORDER — HEPARIN BOLUS VIA INFUSION
4000.0000 [IU] | Freq: Once | INTRAVENOUS | Status: DC
  Filled 2015-11-21: qty 4000

## 2015-11-21 MED ORDER — DOXYCYCLINE HYCLATE 100 MG PO TABS
100.0000 mg | ORAL_TABLET | Freq: Two times a day (BID) | ORAL | Status: DC
  Administered 2015-11-21 – 2015-11-22 (×2): 100 mg via ORAL
  Filled 2015-11-21 (×2): qty 1

## 2015-11-21 MED ORDER — TICAGRELOR 90 MG PO TABS
ORAL_TABLET | ORAL | Status: AC
  Filled 2015-11-21: qty 1

## 2015-11-21 MED ORDER — MORPHINE SULFATE (PF) 2 MG/ML IV SOLN: 2.0000 mg | INTRAVENOUS | Status: DC | PRN

## 2015-11-21 MED ORDER — CLONIDINE HCL 0.1 MG PO TABS
0.2000 mg | ORAL_TABLET | Freq: Every day | ORAL | Status: DC
  Administered 2015-11-21: 0.2 mg via ORAL
  Filled 2015-11-21: qty 2

## 2015-11-21 MED ORDER — TICAGRELOR 90 MG PO TABS
90.0000 mg | ORAL_TABLET | Freq: Two times a day (BID) | ORAL | Status: DC
  Administered 2015-11-21 – 2015-11-22 (×2): 90 mg via ORAL
  Filled 2015-11-21 (×2): qty 1

## 2015-11-21 MED ORDER — SODIUM CHLORIDE 0.9% FLUSH: 3.0000 mL | INTRAVENOUS | Status: DC | PRN

## 2015-11-21 MED ORDER — HEPARIN (PORCINE) IN NACL 2-0.9 UNIT/ML-% IJ SOLN
INTRAMUSCULAR | Status: DC | PRN
  Administered 2015-11-21: 1000 mL

## 2015-11-21 MED ORDER — NITROGLYCERIN 1 MG/10 ML FOR IR/CATH LAB
INTRA_ARTERIAL | Status: AC
  Filled 2015-11-21: qty 10

## 2015-11-21 MED ORDER — INSULIN ASPART 100 UNIT/ML ~~LOC~~ SOLN
0.0000 [IU] | Freq: Three times a day (TID) | SUBCUTANEOUS | Status: DC
  Administered 2015-11-21: 1 [IU] via SUBCUTANEOUS
  Administered 2015-11-21: 7 [IU] via SUBCUTANEOUS
  Administered 2015-11-21 – 2015-11-22 (×2): 2 [IU] via SUBCUTANEOUS

## 2015-11-21 MED ORDER — HEPARIN SODIUM (PORCINE) 1000 UNIT/ML IJ SOLN
INTRAMUSCULAR | Status: DC | PRN
  Administered 2015-11-21: 5000 [IU] via INTRAVENOUS

## 2015-11-21 MED ORDER — SODIUM CHLORIDE 0.9 % WEIGHT BASED INFUSION: 3.0000 mL/kg/h | INTRAVENOUS | Status: DC

## 2015-11-21 MED ORDER — DEXTROSE 5 % IV SOLN
100.0000 mg | Freq: Two times a day (BID) | INTRAVENOUS | Status: DC
  Administered 2015-11-21: 100 mg via INTRAVENOUS
  Filled 2015-11-21 (×2): qty 100

## 2015-11-21 MED ORDER — TICAGRELOR 90 MG PO TABS
ORAL_TABLET | ORAL | Status: DC | PRN
  Administered 2015-11-21: 180 mg via ORAL

## 2015-11-21 MED ORDER — HEPARIN (PORCINE) IN NACL 2-0.9 UNIT/ML-% IJ SOLN
INTRAMUSCULAR | Status: AC
  Filled 2015-11-21: qty 1000

## 2015-11-21 MED ORDER — IOPAMIDOL (ISOVUE-370) INJECTION 76%
INTRAVENOUS | Status: AC
  Filled 2015-11-21: qty 100

## 2015-11-21 MED ORDER — LISINOPRIL 10 MG PO TABS
20.0000 mg | ORAL_TABLET | Freq: Every day | ORAL | Status: DC
  Administered 2015-11-21: 22:00:00 20 mg via ORAL
  Filled 2015-11-21: qty 2

## 2015-11-21 MED ORDER — FENTANYL CITRATE (PF) 100 MCG/2ML IJ SOLN
INTRAMUSCULAR | Status: DC | PRN
  Administered 2015-11-21: 25 ug via INTRAVENOUS

## 2015-11-21 MED ORDER — ASPIRIN EC 325 MG PO TBEC: 325.0000 mg | DELAYED_RELEASE_TABLET | Freq: Every day | ORAL | Status: DC

## 2015-11-21 MED ORDER — TRAZODONE HCL 50 MG PO TABS
50.0000 mg | ORAL_TABLET | Freq: Every evening | ORAL | Status: DC | PRN
  Administered 2015-11-21: 50 mg via ORAL
  Filled 2015-11-21: qty 1

## 2015-11-21 MED ORDER — ENOXAPARIN SODIUM 40 MG/0.4ML ~~LOC~~ SOLN: 40.0000 mg | SUBCUTANEOUS | Status: DC

## 2015-11-21 MED ORDER — ACETAMINOPHEN 325 MG PO TABS: 650.0000 mg | ORAL_TABLET | ORAL | Status: DC | PRN

## 2015-11-21 MED ORDER — ASPIRIN 81 MG PO CHEW: 81.0000 mg | CHEWABLE_TABLET | ORAL | Status: DC

## 2015-11-21 MED ORDER — SODIUM CHLORIDE 0.9 % IV SOLN
INTRAVENOUS | Status: DC
  Administered 2015-11-21: 03:00:00 via INTRAVENOUS

## 2015-11-21 MED ORDER — SODIUM CHLORIDE 0.9 % WEIGHT BASED INFUSION: 1.0000 mL/kg/h | INTRAVENOUS | Status: DC

## 2015-11-21 MED ORDER — ENOXAPARIN SODIUM 40 MG/0.4ML ~~LOC~~ SOLN
40.0000 mg | SUBCUTANEOUS | Status: DC
  Administered 2015-11-22: 10:00:00 40 mg via SUBCUTANEOUS
  Filled 2015-11-21: qty 0.4

## 2015-11-21 MED ORDER — IOPAMIDOL (ISOVUE-370) INJECTION 76%
INTRAVENOUS | Status: AC
  Filled 2015-11-21: qty 125

## 2015-11-21 MED ORDER — NITROGLYCERIN 1 MG/10 ML FOR IR/CATH LAB
INTRA_ARTERIAL | Status: DC | PRN
Start: 2015-11-21 — End: 2015-11-21
  Administered 2015-11-21: 200 ug via INTRACORONARY

## 2015-11-21 MED ORDER — NITROGLYCERIN 0.4 MG SL SUBL
0.4000 mg | SUBLINGUAL_TABLET | SUBLINGUAL | Status: DC | PRN
  Administered 2015-11-21 – 2015-11-22 (×3): 0.4 mg via SUBLINGUAL
  Filled 2015-11-21 (×2): qty 1

## 2015-11-21 MED ORDER — PNEUMOCOCCAL VAC POLYVALENT 25 MCG/0.5ML IJ INJ
0.5000 mL | INJECTION | INTRAMUSCULAR | Status: AC
  Administered 2015-11-22: 0.5 mL via INTRAMUSCULAR
  Filled 2015-11-21: qty 0.5

## 2015-11-21 MED ORDER — ALBUTEROL SULFATE (2.5 MG/3ML) 0.083% IN NEBU: 2.5000 mg | INHALATION_SOLUTION | Freq: Four times a day (QID) | RESPIRATORY_TRACT | Status: DC | PRN

## 2015-11-21 MED ORDER — OXYMORPHONE HCL ER 20 MG PO TB12: 20.0000 mg | ORAL_TABLET | Freq: Two times a day (BID) | ORAL | Status: DC

## 2015-11-21 MED ORDER — SODIUM CHLORIDE 0.9% FLUSH: 3.0000 mL | Freq: Two times a day (BID) | INTRAVENOUS | Status: DC

## 2015-11-21 MED ORDER — VERAPAMIL HCL 2.5 MG/ML IV SOLN
INTRAVENOUS | Status: AC
  Filled 2015-11-21: qty 2

## 2015-11-21 MED ORDER — BIVALIRUDIN BOLUS VIA INFUSION - CUPID
INTRAVENOUS | Status: DC | PRN
  Administered 2015-11-21: 82.125 mg via INTRAVENOUS

## 2015-11-21 MED ORDER — AMLODIPINE BESYLATE 10 MG PO TABS
10.0000 mg | ORAL_TABLET | Freq: Every morning | ORAL | Status: DC
  Administered 2015-11-21 – 2015-11-22 (×2): 10 mg via ORAL
  Filled 2015-11-21 (×2): qty 1

## 2015-11-21 MED ORDER — SODIUM CHLORIDE 0.9 % WEIGHT BASED INFUSION: 1.0000 mL/kg/h | INTRAVENOUS | Status: AC

## 2015-11-21 MED ORDER — HEPARIN (PORCINE) IN NACL 100-0.45 UNIT/ML-% IJ SOLN
1250.0000 [IU]/h | INTRAMUSCULAR | Status: DC
  Filled 2015-11-21: qty 250

## 2015-11-21 MED ORDER — ONDANSETRON HCL 4 MG/2ML IJ SOLN
4.0000 mg | Freq: Four times a day (QID) | INTRAMUSCULAR | Status: DC | PRN
  Administered 2015-11-21: 15:00:00 4 mg via INTRAVENOUS
  Filled 2015-11-21: qty 2

## 2015-11-21 MED ORDER — HEPARIN (PORCINE) IN NACL 2-0.9 UNIT/ML-% IJ SOLN
INTRAMUSCULAR | Status: DC | PRN
  Administered 2015-11-21: 10 mL via INTRA_ARTERIAL

## 2015-11-21 MED ORDER — SODIUM CHLORIDE 0.9 % IV SOLN
INTRAVENOUS | Status: DC | PRN
  Administered 2015-11-21 (×2): 1.75 mg/kg/h via INTRAVENOUS

## 2015-11-21 MED ORDER — HEPARIN SODIUM (PORCINE) 1000 UNIT/ML IJ SOLN
INTRAMUSCULAR | Status: AC
  Filled 2015-11-21: qty 1

## 2015-11-21 MED ORDER — FENTANYL CITRATE (PF) 100 MCG/2ML IJ SOLN
INTRAMUSCULAR | Status: AC
  Filled 2015-11-21: qty 2

## 2015-11-21 MED ORDER — LIDOCAINE HCL (PF) 1 % IJ SOLN
INTRAMUSCULAR | Status: AC
  Filled 2015-11-21: qty 30

## 2015-11-21 MED ORDER — LIDOCAINE HCL (PF) 1 % IJ SOLN
INTRAMUSCULAR | Status: DC | PRN
  Administered 2015-11-21: 2 mL via INTRADERMAL

## 2015-11-21 MED ORDER — ASPIRIN 81 MG PO CHEW
81.0000 mg | CHEWABLE_TABLET | Freq: Every day | ORAL | Status: DC
  Administered 2015-11-21 – 2015-11-22 (×2): 81 mg via ORAL
  Filled 2015-11-21 (×3): qty 1

## 2015-11-21 MED ORDER — GABAPENTIN 300 MG PO CAPS
300.0000 mg | ORAL_CAPSULE | Freq: Three times a day (TID) | ORAL | Status: DC
  Administered 2015-11-21 – 2015-11-22 (×4): 300 mg via ORAL
  Filled 2015-11-21 (×4): qty 1

## 2015-11-21 MED ORDER — MIDAZOLAM HCL 2 MG/2ML IJ SOLN
INTRAMUSCULAR | Status: DC | PRN
Start: 2015-11-21 — End: 2015-11-21
  Administered 2015-11-21: 1 mg via INTRAVENOUS

## 2015-11-21 MED ORDER — OXYCODONE HCL 5 MG PO TABS: 10.0000 mg | ORAL_TABLET | Freq: Three times a day (TID) | ORAL | Status: DC | PRN

## 2015-11-21 MED ORDER — SODIUM CHLORIDE 0.9 % IV SOLN: 250.0000 mL | INTRAVENOUS | Status: DC | PRN

## 2015-11-21 MED ORDER — MIDAZOLAM HCL 2 MG/2ML IJ SOLN
INTRAMUSCULAR | Status: AC
  Filled 2015-11-21: qty 2

## 2015-11-21 MED ORDER — MORPHINE SULFATE ER 15 MG PO TBCR
60.0000 mg | EXTENDED_RELEASE_TABLET | Freq: Two times a day (BID) | ORAL | Status: DC
  Administered 2015-11-21 – 2015-11-22 (×4): 60 mg via ORAL
  Filled 2015-11-21 (×4): qty 4

## 2015-11-21 SURGICAL SUPPLY — 23 items
BALLN EMERGE MR 2.5X12 (BALLOONS) ×2
BALLN ~~LOC~~ EMERGE MR 3.5X8 (BALLOONS) ×2 IMPLANT
BALLN ~~LOC~~ EMERGE MR 4.0X8 (BALLOONS) ×2
BALLN ~~LOC~~ EUPHORA RX 4.0X8 (BALLOONS) ×2
BALLOON EMERGE MR 2.5X12 (BALLOONS) ×1 IMPLANT
BALLOON ~~LOC~~ EMERGE MR 4.0X8 (BALLOONS) ×1 IMPLANT
BALLOON ~~LOC~~ EUPHORA RX 4.0X8 (BALLOONS) ×1 IMPLANT
CATH INFINITI 5 FR JL3.5 (CATHETERS) ×2 IMPLANT
CATH INFINITI 5FR ANG PIGTAIL (CATHETERS) ×2 IMPLANT
CATH INFINITI JR4 5F (CATHETERS) ×2 IMPLANT
DEVICE RAD COMP TR BAND LRG (VASCULAR PRODUCTS) ×2 IMPLANT
GLIDESHEATH SLEND SS 6F .021 (SHEATH) ×2 IMPLANT
GUIDE CATH RUNWAY 6FR CLS3.5 (CATHETERS) ×2 IMPLANT
KIT HEART LEFT (KITS) ×2 IMPLANT
PACK CARDIAC CATHETERIZATION (CUSTOM PROCEDURE TRAY) ×2 IMPLANT
STENT SYNERGY DES 3.5X12 (Permanent Stent) ×2 IMPLANT
SYR MEDRAD MARK V 150ML (SYRINGE) ×2 IMPLANT
TRANSDUCER W/STOPCOCK (MISCELLANEOUS) ×2 IMPLANT
TUBING CIL FLEX 10 FLL-RA (TUBING) ×2 IMPLANT
WIRE ASAHI MEDIUM 180CM (WIRE) ×2 IMPLANT
WIRE ASAHI PROWATER 180CM (WIRE) ×2 IMPLANT
WIRE LUGE 182CM (WIRE) ×2 IMPLANT
WIRE SAFE-T 1.5MM-J .035X260CM (WIRE) ×4 IMPLANT

## 2015-11-21 NOTE — Consult Note (Signed)
CARDIOLOGY CONSULT NOTE   Patient ID: Miranda [name] MRN: [id number] DOB/AGE: 11-06-1955 60 y.o.  Admit date: 11/20/2015  Requesting Physician: Dr. Russella Dar Primary Physician:   Dorrene German, MD Primary Cardiologist:  New Reason for Consultation:   Chest pain   HPI: Miranda [name] is a 60 y.o. female with a history of obesity, DMT2, HLD, HTN, ongoing tobacco abuse, previous CVA, chronic pain and family hx of CAD but no personal cardiac history who presented to [facility] ED on 11/20/15 with chest pain.   She has no past cardiac history. She was admitted here in 04/2012 for left basal ganglier stroke. 2-D echo at that time showed EF 65-70% with no valvular abnormalities.  She was in her usual state of health until this week. Over the past 3 days she has noted an exertional burning chest pain that radiates down her arms. It is associated with shortness of breath and diaphoresis. No lower extremity edema, orthopnea or PND. No dizziness or syncope. She does have chronic pain due to knee and lower back issues.  Her mother had her first MI in her early 81s. She has smoked a PPD for over 50 years but has recently been trying to cut back (down to 1/2 PPD).    On arrival to the ER her BG was noted to be markedly elevated (407) but no DKA. ECG with nonspecific ST/T-wave changes similar to previous. Otherwise lab work unremarkable. First 2 troponins were negative but third troponin now slightly elevated at 0.07. CXR with atelectasis vs early PNA. Now WBC count up to 10.6.     Past Medical History:  Diagnosis Date  . Acid reflux   . Anxiety   . Asthma   . Degenerative disc disease   . Depression   . Diabetes mellitus   . Hypertension   . Stroke [facility])    04/22/12     Past Surgical History:  Procedure Laterality Date  . KNEE SURGERY      No Known Allergies  I have reviewed the patient's current medications . amLODipine  10 mg Oral q morning - 10a  . aspirin EC  325 mg Oral Daily    . cloNIDine  0.2 mg Oral QHS  . doxycycline (VIBRAMYCIN) IV  100 mg Intravenous BID  . enoxaparin (LOVENOX) injection  40 mg Subcutaneous Q24H  . gabapentin  300 mg Oral TID  . insulin aspart  0-9 Units Subcutaneous TID WC  . insulin glargine  50 Units Subcutaneous BID  . linagliptin  5 mg Oral Daily  . lisinopril  20 mg Oral QHS  . morphine  60 mg Oral Q12H  . [START ON 11/22/2015] pneumococcal 23 valent vaccine  0.5 mL Intramuscular Tomorrow-1000   . sodium chloride 75 mL/hr at 11/21/15 0300   acetaminophen, albuterol, morphine injection, nitroGLYCERIN, ondansetron (ZOFRAN) IV, oxyCODONE, traZODone  Prior to Admission medications   Medication Sig Start Date End Date Taking? Authorizing Provider  albuterol (PROVENTIL HFA;VENTOLIN HFA) 108 (90 Base) MCG/ACT inhaler Inhale 2 puffs into the lungs every 6 (six) hours as needed for wheezing or shortness of breath.   Yes Historical Provider, MD  amLODipine (NORVASC) 10 MG tablet Take 10 mg by mouth every morning.    Yes Historical Provider, MD  cholecalciferol (VITAMIN D) 1000 UNITS tablet Take 3,000 Units by mouth daily.   Yes Historical Provider, MD  cloNIDine (CATAPRES) 0.2 MG tablet Take 0.2 mg by mouth at bedtime.   Yes Historical Provider,  MD  gabapentin (NEURONTIN) 300 MG capsule Take 300 mg by mouth 3 (three) times daily.    Yes Historical Provider, MD  hydrochlorothiazide (HYDRODIURIL) 25 MG tablet Take 25 mg by mouth every morning.    Yes Historical Provider, MD  ibuprofen (ADVIL,MOTRIN) 800 MG tablet Take 1 tablet (800 mg total) by mouth every 8 (eight) hours as needed for pain. 01/25/13  Yes Christopher Lawyer, PA-C  insulin glargine (LANTUS) 100 UNIT/ML injection Inject 40 Units into the skin 2 (two) times daily. Patient taking differently: Inject 50 Units into the skin 2 (two) times daily.  04/24/12  Yes Adeline C Viyuoh, MD  lisinopril (PRINIVIL,ZESTRIL) 20 MG tablet Take 20 mg by mouth at bedtime.    Yes Historical Provider, MD   metFORMIN (GLUMETZA) 1000 MG (MOD) 24 hr tablet Take 1,000 mg by mouth 2 (two) times daily with a meal.   Yes Historical Provider, MD  Oxycodone HCl 10 MG TABS Take 10 mg by mouth 3 (three) times daily as needed (pain).    Yes Ruby Cola, PA-C  oxymorphone (OPANA ER) 20 MG 12 hr tablet Take 20 mg by mouth every 12 (twelve) hours.   Yes Historical Provider, MD  saxagliptin HCl (ONGLYZA) 2.5 MG TABS tablet Take 5 mg by mouth every morning.   Yes Historical Provider, MD  traZODone (DESYREL) 50 MG tablet Take 50 mg by mouth at bedtime as needed for sleep.    Yes Historical Provider, MD  Vitamin D, Ergocalciferol, (DRISDOL) 50000 units CAPS capsule Take 50,000 Units by mouth every 7 (seven) days.   Yes Historical Provider, MD     Social History   Social History  . Marital status: Divorced    Spouse name: N/A  . Number of children: N/A  . Years of education: N/A   Occupational History  . Not on file.   Social History Main Topics  . Smoking status: Current Every Day Smoker    Types: Cigarettes  . Smokeless tobacco: Never Used  . Alcohol use No  . Drug use: No  . Sexual activity: No   Other Topics Concern  . Not on file   Social History Narrative  . No narrative on file    Family Status  Relation Status  . Mother Deceased  . Father Deceased  . Other    Family History  Problem Relation Age of Onset  . Hypertension Other     ROS:  Full 14 point review of systems complete and found to be negative unless listed above.  Physical Exam: Blood pressure 111/62, pulse 78, temperature 98.3 F (36.8 C), temperature source Oral, resp. rate 19, height  (1.778 m), weight 241 lb 4.8 oz (109.5 kg), SpO2 93 %.  General: Well developed, well nourished, female in no acute distress Head: Eyes PERRLA, No xanthomas.   Normocephalic and atraumatic, oropharynx without edema or exudate.  Lungs: CTAB Heart: HRRR S1 S2, no rub/gallop, Heart regular rate and rhythm with S1, S2   murmur. pulses are 2+ extrem.   Neck: No carotid bruits. No lymphadenopathy. No JVD. Abdomen: Bowel sounds present, abdomen soft and non-tender without masses or hernias noted. Msk:  No spine or cva tenderness. No weakness, no joint deformities or effusions. Extremities: No clubbing or cyanosis. NO LE  edema.  Neuro: Alert and oriented X 3. No focal deficits noted. Psych:  Good affect, responds appropriately Skin: No rashes or lesions noted.  Labs:   Lab Results  Component Value Date   WBC 10.6 (  H) 11/21/2015   HGB 12.8 11/21/2015   HCT 40.9 11/21/2015   MCV 89.1 11/21/2015   PLT 350 11/21/2015   No results for input(s): INR in the last 72 hours.  Recent Labs Lab 11/20/15 1722 11/21/15 0310  NA 137  --   K 3.6  --   CL 99*  --   CO2 28  --   BUN 10  --   CREATININE 0.98 0.83  CALCIUM 9.3  --   GLUCOSE 407*  --    Magnesium  Date Value Ref Range Status  11/12/2007 2.1 1.5 - 2.5 mg/dL Final    Recent Labs  16/10/96 0310  TROPONINI 0.07*    Recent Labs  11/20/15 1727 11/20/15 2129  TROPIPOC 0.02 0.02    Lab Results  Component Value Date   DDIMER 0.29 11/21/2015    Echo: 04/23/2012 LV EF: 65% -  70% Study Conclusion - Left ventricle: The cavity size was normal. Wall thickness was normal. Systolic function was vigorous. The estimated ejection fraction was in the range of 65% to 70%. Doppler parameters are consistent with abnormal left ventricular relaxation (grade 1 diastolic dysfunction). - Aortic valve: Trivial regurgitation.   ECG:  NSR with nonspecific ST/T-wave changes  Radiology:  Dg Chest 2 View  Result Date: 11/20/2015 CLINICAL DATA:  Chest pain and shortness of breath for 2 days. Cough for 1 week. EXAM: CHEST  2 VIEW COMPARISON:  Radiograph 04/20/2015 FINDINGS: The cardiomediastinal contours are normal. Streaky opacities at the left lung base. Pulmonary vasculature is normal. No pleural effusion or pneumothorax. No acute osseous  abnormalities are seen. IMPRESSION: Streaky left basilar opacities, favor atelectasis over early pneumonia. Electronically Signed   By: Rubye Oaks M.D.   On: 11/20/2015 18:28    ASSESSMENT AND PLAN:    Principal Problem:   Pain in the chest Active Problems:   Essential hypertension   Type 2 diabetes mellitus with hyperglycemia (HCC)   Hyperlipidemia   Miranda [name] is a 60 y.o. female with a history of obesity, DMT2, HLD, HTN, ongoing tobacco abuse, previous CVA, chronic pain and family hx of CAD but no personal cardiac history who presented to [facility] ED on 11/20/15 with chest pain.   Chest pain: Patient with typical chest pain, multiple risk factors including obesity, HTN, HLD, uncontrolled diabetes, ongoing tobacco abuse and family history of early CAD. Troponin negative x2 but now she has ruled in with a slightly abnormal troponin of 0.07. I will start IV heparin and place her on the cath board.  I have reviewed the risks, indications, and alternatives to cardiac catheterization and possible angioplasty/stenting with the patient. Risks include but are not limited to bleeding, infection, vascular injury, stroke, myocardial infection, arrhythmia, kidney injury, radiation-related injury in the case of prolonged fluoroscopy use, emergency cardiac surgery, and death. The patient understands the risks of serious complication is low (<1%).   Possible PNA: CXR on admission showed atelectasis versus early pneumonia. Doxycycline was started after white count increased to 10.6  HTN: Blood pressure was elevated on admission at 187/97. Now with much better control. Continue amlodipine 10 mg, Clonidine 0.2 mg and lisinopril 20 mg.  HLD: Last lipid panel was in 2014 which showed an LDL of 125. Will obtain a lipid panel now as she is NPO  DMT2: Hold metformin as she is getting a heart catheterization. Continue SSI per primary team.  History of CVA (2014): History of left basal ganglia stroke found on  CT in 04/2012.  Home meds reveal that she is on aspirin or Plavix. She has been started on aspirin 325 mg daily. I will decrease this to 81 mg  Signed: Cline CrockKathryn Thompson, PA-C 11/21/2015 7:02 AM  Pager [phone]  Co-Sign MD   Agree with note by Carlean JewsKatie Thompson PA-C  Pt admitted with good story of accelerated [place]. Enz borderline +. Exam benign. Labs OK. For cardiac cath today.   Runell GessJonathan J. Barbarajean Kinzler, M.D., FACP, [facility] Care ServicesFACC, Earl LagosFAHA, [facility] Healthcare SystemFSCAI [facility] [address] [place], [place]  [id number]  [phone] 11/21/2015 9:25 AM

## 2015-11-21 NOTE — Progress Notes (Signed)
Pt seen and examined, admitted earlier this am by Dr.Kakrakandy 59/F with DM, HTN and ongoing tobacco use admitted with chest pain concerning for Angina Last troponin 0.07, Cards consulted, plan for LHC today  Zannie CovePreetha Arlin Savona, MD

## 2015-11-21 NOTE — Interval H&P Note (Signed)
History and Physical Interval Note:  11/21/2015 10:31 AM  Miranda [name]  has presented today for surgery, with the diagnosis of chest pain  The various methods of treatment have been discussed with the patient and family. After consideration of risks, benefits and other options for treatment, the patient has consented to  Procedure(s): Left Heart Cath and Coronary Angiography (N/A) as a surgical intervention .  The patient's history has been reviewed, patient examined, no change in status, stable for surgery.  I have reviewed the patient's chart and labs.  Questions were answered to the patient's satisfaction.    Cath Lab Visit (complete for each Cath Lab visit)  Clinical Evaluation Leading to the Procedure:   ACS: Yes.    Non-ACS:    Anginal Classification: CCS IV  Anti-ischemic medical therapy: Maximal Therapy (2 or more classes of medications)  Non-Invasive Test Results: No non-invasive testing performed  Prior CABG: No previous CABG       Theron Aristaeter Third Street Surgery Center LPJordanMD,FACC 11/21/2015 10:31 AM

## 2015-11-21 NOTE — Plan of Care (Signed)
Problem: Education: Goal: Knowledge of  General Education information/materials will improve Outcome: Progressing Patient aware of plan of care.  RN provided medication education on all medications administered thus far this shift.  Patient stated understanding.  Patient did request the hospital chaplain to come pray with her, order entered per RN.  Patient did complain of chest discomfort x1 since patient arrived to unit.  Scheduled medication given, reassessment pending (see MAR and flowsheets for detailed information).

## 2015-11-21 NOTE — Progress Notes (Signed)
Recent Troponin resulted at 0.07.  POC Troponin 0.02.  [facility] text paged with this information via Amion.

## 2015-11-21 NOTE — Progress Notes (Signed)
ANTICOAGULATION CONSULT NOTE - Initial Consult  Pharmacy Consult for Heparin Indication: chest pain/ACS  No Known Allergies  Patient Measurements: Height: 5\' 10"  (177.8 cm) Weight: 241 lb 4.8 oz (109.5 kg) IBW/kg (Calculated) : 68.5 Heparin Dosing Weight: 90 kg  Vital Signs: Temp: 98.3 F (36.8 C) (08/09 0458) Temp Source: Oral (08/09 0458) BP: 111/62 (08/09 0458) Pulse Rate: 78 (08/09 0458)  Labs:  Recent Labs  11/20/15 1722 11/21/15 0310  HGB 14.5 12.8  HCT 45.9 40.9  PLT 391 350  CREATININE 0.98 0.83  TROPONINI  --  0.07*    Estimated Creatinine Clearance: 99 mL/min (by C-G formula based on SCr of 0.83 mg/dL).   Medical History: Past Medical History:  Diagnosis Date  . Acid reflux   . Anxiety   . Asthma   . Degenerative disc disease   . Depression   . Diabetes mellitus   . Hypertension   . Stroke (HCC)    04/22/12    Medications:  Prescriptions Prior to Admission  Medication Sig Dispense Refill Last Dose  . albuterol (PROVENTIL HFA;VENTOLIN HFA) 108 (90 Base) MCG/ACT inhaler Inhale 2 puffs into the lungs every 6 (six) hours as needed for wheezing or shortness of breath.   months  . amLODipine (NORVASC) 10 MG tablet Take 10 mg by mouth every morning.    11/20/2015 at Unknown time  . cholecalciferol (VITAMIN D) 1000 UNITS tablet Take 3,000 Units by mouth daily.   Past Week at Unknown time  . cloNIDine (CATAPRES) 0.2 MG tablet Take 0.2 mg by mouth at bedtime.   11/19/2015 at Unknown time  . gabapentin (NEURONTIN) 300 MG capsule Take 300 mg by mouth 3 (three) times daily.    11/20/2015 at Unknown time  . hydrochlorothiazide (HYDRODIURIL) 25 MG tablet Take 25 mg by mouth every morning.    11/20/2015 at Unknown time  . ibuprofen (ADVIL,MOTRIN) 800 MG tablet Take 1 tablet (800 mg total) by mouth every 8 (eight) hours as needed for pain. 21 tablet 0 unk  . insulin glargine (LANTUS) 100 UNIT/ML injection Inject 40 Units into the skin 2 (two) times daily. (Patient taking  differently: Inject 50 Units into the skin 2 (two) times daily. ) 10 mL  11/20/2015 at am  . lisinopril (PRINIVIL,ZESTRIL) 20 MG tablet Take 20 mg by mouth at bedtime.    11/19/2015 at Unknown time  . metFORMIN (GLUMETZA) 1000 MG (MOD) 24 hr tablet Take 1,000 mg by mouth 2 (two) times daily with a meal.   11/20/2015 at am  . Oxycodone HCl 10 MG TABS Take 10 mg by mouth 3 (three) times daily as needed (pain).    months  . oxymorphone (OPANA ER) 20 MG 12 hr tablet Take 20 mg by mouth every 12 (twelve) hours.   months  . saxagliptin HCl (ONGLYZA) 2.5 MG TABS tablet Take 5 mg by mouth every morning.   11/20/2015 at Unknown time  . traZODone (DESYREL) 50 MG tablet Take 50 mg by mouth at bedtime as needed for sleep.    Past Month at Unknown time  . Vitamin D, Ergocalciferol, (DRISDOL) 50000 units CAPS capsule Take 50,000 Units by mouth every 7 (seven) days.   11/12/2015    Assessment: 60 y.o. female with chest pain for heparin  Goal of Therapy:  Heparin level 0.3-0.7 units/ml Monitor platelets by anticoagulation protocol: Yes   Plan:  Heparin 4000 units IV bolus, then start heparin 1250 units/hr Check heparin level in 6 hours.   Rosalene Wardrop, Gary FleetGregory Vernon  11/21/2015,7:31 AM

## 2015-11-21 NOTE — H&P (View-Only) (Signed)
CARDIOLOGY CONSULT NOTE   Patient ID: Miranda [name] MRN: [id number] DOB/AGE: 11-06-1955 60 y.o.  Admit date: 11/20/2015  Requesting Physician: Dr. Russella Dar Primary Physician:   Dorrene German, MD Primary Cardiologist:  New Reason for Consultation:   Chest pain   HPI: Miranda [name] is a 60 y.o. female with a history of obesity, DMT2, HLD, HTN, ongoing tobacco abuse, previous CVA, chronic pain and family hx of CAD but no personal cardiac history who presented to [facility] ED on 11/20/15 with chest pain.   She has no past cardiac history. She was admitted here in 04/2012 for left basal ganglier stroke. 2-D echo at that time showed EF 65-70% with no valvular abnormalities.  She was in her usual state of health until this week. Over the past 3 days she has noted an exertional burning chest pain that radiates down her arms. It is associated with shortness of breath and diaphoresis. No lower extremity edema, orthopnea or PND. No dizziness or syncope. She does have chronic pain due to knee and lower back issues.  Her mother had her first MI in her early 81s. She has smoked a PPD for over 50 years but has recently been trying to cut back (down to 1/2 PPD).    On arrival to the ER her BG was noted to be markedly elevated (407) but no DKA. ECG with nonspecific ST/T-wave changes similar to previous. Otherwise lab work unremarkable. First 2 troponins were negative but third troponin now slightly elevated at 0.07. CXR with atelectasis vs early PNA. Now WBC count up to 10.6.     Past Medical History:  Diagnosis Date  . Acid reflux   . Anxiety   . Asthma   . Degenerative disc disease   . Depression   . Diabetes mellitus   . Hypertension   . Stroke [facility])    04/22/12     Past Surgical History:  Procedure Laterality Date  . KNEE SURGERY      No Known Allergies  I have reviewed the patient's current medications . amLODipine  10 mg Oral q morning - 10a  . aspirin EC  325 mg Oral Daily    . cloNIDine  0.2 mg Oral QHS  . doxycycline (VIBRAMYCIN) IV  100 mg Intravenous BID  . enoxaparin (LOVENOX) injection  40 mg Subcutaneous Q24H  . gabapentin  300 mg Oral TID  . insulin aspart  0-9 Units Subcutaneous TID WC  . insulin glargine  50 Units Subcutaneous BID  . linagliptin  5 mg Oral Daily  . lisinopril  20 mg Oral QHS  . morphine  60 mg Oral Q12H  . [START ON 11/22/2015] pneumococcal 23 valent vaccine  0.5 mL Intramuscular Tomorrow-1000   . sodium chloride 75 mL/hr at 11/21/15 0300   acetaminophen, albuterol, morphine injection, nitroGLYCERIN, ondansetron (ZOFRAN) IV, oxyCODONE, traZODone  Prior to Admission medications   Medication Sig Start Date End Date Taking? Authorizing Provider  albuterol (PROVENTIL HFA;VENTOLIN HFA) 108 (90 Base) MCG/ACT inhaler Inhale 2 puffs into the lungs every 6 (six) hours as needed for wheezing or shortness of breath.   Yes Historical Provider, MD  amLODipine (NORVASC) 10 MG tablet Take 10 mg by mouth every morning.    Yes Historical Provider, MD  cholecalciferol (VITAMIN D) 1000 UNITS tablet Take 3,000 Units by mouth daily.   Yes Historical Provider, MD  cloNIDine (CATAPRES) 0.2 MG tablet Take 0.2 mg by mouth at bedtime.   Yes Historical Provider,  MD  gabapentin (NEURONTIN) 300 MG capsule Take 300 mg by mouth 3 (three) times daily.    Yes Historical Provider, MD  hydrochlorothiazide (HYDRODIURIL) 25 MG tablet Take 25 mg by mouth every morning.    Yes Historical Provider, MD  ibuprofen (ADVIL,MOTRIN) 800 MG tablet Take 1 tablet (800 mg total) by mouth every 8 (eight) hours as needed for pain. 01/25/13  Yes Christopher Lawyer, PA-C  insulin glargine (LANTUS) 100 UNIT/ML injection Inject 40 Units into the skin 2 (two) times daily. Patient taking differently: Inject 50 Units into the skin 2 (two) times daily.  04/24/12  Yes Adeline C Viyuoh, MD  lisinopril (PRINIVIL,ZESTRIL) 20 MG tablet Take 20 mg by mouth at bedtime.    Yes Historical Provider, MD   metFORMIN (GLUMETZA) 1000 MG (MOD) 24 hr tablet Take 1,000 mg by mouth 2 (two) times daily with a meal.   Yes Historical Provider, MD  Oxycodone HCl 10 MG TABS Take 10 mg by mouth 3 (three) times daily as needed (pain).    Yes Ruby Cola, PA-C  oxymorphone (OPANA ER) 20 MG 12 hr tablet Take 20 mg by mouth every 12 (twelve) hours.   Yes Historical Provider, MD  saxagliptin HCl (ONGLYZA) 2.5 MG TABS tablet Take 5 mg by mouth every morning.   Yes Historical Provider, MD  traZODone (DESYREL) 50 MG tablet Take 50 mg by mouth at bedtime as needed for sleep.    Yes Historical Provider, MD  Vitamin D, Ergocalciferol, (DRISDOL) 50000 units CAPS capsule Take 50,000 Units by mouth every 7 (seven) days.   Yes Historical Provider, MD     Social History   Social History  . Marital status: Divorced    Spouse name: N/A  . Number of children: N/A  . Years of education: N/A   Occupational History  . Not on file.   Social History Main Topics  . Smoking status: Current Every Day Smoker    Types: Cigarettes  . Smokeless tobacco: Never Used  . Alcohol use No  . Drug use: No  . Sexual activity: No   Other Topics Concern  . Not on file   Social History Narrative  . No narrative on file    Family Status  Relation Status  . Mother Deceased  . Father Deceased  . Other    Family History  Problem Relation Age of Onset  . Hypertension Other     ROS:  Full 14 point review of systems complete and found to be negative unless listed above.  Physical Exam: Blood pressure 111/62, pulse 78, temperature 98.3 F (36.8 C), temperature source Oral, resp. rate 19, height  (1.778 m), weight 241 lb 4.8 oz (109.5 kg), SpO2 93 %.  General: Well developed, well nourished, female in no acute distress Head: Eyes PERRLA, No xanthomas.   Normocephalic and atraumatic, oropharynx without edema or exudate.  Lungs: CTAB Heart: HRRR S1 S2, no rub/gallop, Heart regular rate and rhythm with S1, S2   murmur. pulses are 2+ extrem.   Neck: No carotid bruits. No lymphadenopathy. No JVD. Abdomen: Bowel sounds present, abdomen soft and non-tender without masses or hernias noted. Msk:  No spine or cva tenderness. No weakness, no joint deformities or effusions. Extremities: No clubbing or cyanosis. NO LE  edema.  Neuro: Alert and oriented X 3. No focal deficits noted. Psych:  Good affect, responds appropriately Skin: No rashes or lesions noted.  Labs:   Lab Results  Component Value Date   WBC 10.6 (  H) 11/21/2015   HGB 12.8 11/21/2015   HCT 40.9 11/21/2015   MCV 89.1 11/21/2015   PLT 350 11/21/2015   No results for input(s): INR in the last 72 hours.  Recent Labs Lab 11/20/15 1722 11/21/15 0310  NA 137  --   K 3.6  --   CL 99*  --   CO2 28  --   BUN 10  --   CREATININE 0.98 0.83  CALCIUM 9.3  --   GLUCOSE 407*  --    Magnesium  Date Value Ref Range Status  11/12/2007 2.1 1.5 - 2.5 mg/dL Final    Recent Labs  16/10/96 0310  TROPONINI 0.07*    Recent Labs  11/20/15 1727 11/20/15 2129  TROPIPOC 0.02 0.02    Lab Results  Component Value Date   DDIMER 0.29 11/21/2015    Echo: 04/23/2012 LV EF: 65% -  70% Study Conclusion - Left ventricle: The cavity size was normal. Wall thickness was normal. Systolic function was vigorous. The estimated ejection fraction was in the range of 65% to 70%. Doppler parameters are consistent with abnormal left ventricular relaxation (grade 1 diastolic dysfunction). - Aortic valve: Trivial regurgitation.   ECG:  NSR with nonspecific ST/T-wave changes  Radiology:  Dg Chest 2 View  Result Date: 11/20/2015 CLINICAL DATA:  Chest pain and shortness of breath for 2 days. Cough for 1 week. EXAM: CHEST  2 VIEW COMPARISON:  Radiograph 04/20/2015 FINDINGS: The cardiomediastinal contours are normal. Streaky opacities at the left lung base. Pulmonary vasculature is normal. No pleural effusion or pneumothorax. No acute osseous  abnormalities are seen. IMPRESSION: Streaky left basilar opacities, favor atelectasis over early pneumonia. Electronically Signed   By: Rubye Oaks M.D.   On: 11/20/2015 18:28    ASSESSMENT AND PLAN:    Principal Problem:   Pain in the chest Active Problems:   Essential hypertension   Type 2 diabetes mellitus with hyperglycemia (HCC)   Hyperlipidemia   Miranda [name] is a 60 y.o. female with a history of obesity, DMT2, HLD, HTN, ongoing tobacco abuse, previous CVA, chronic pain and family hx of CAD but no personal cardiac history who presented to [facility] ED on 11/20/15 with chest pain.   Chest pain: Patient with typical chest pain, multiple risk factors including obesity, HTN, HLD, uncontrolled diabetes, ongoing tobacco abuse and family history of early CAD. Troponin negative x2 but now she has ruled in with a slightly abnormal troponin of 0.07. I will start IV heparin and place her on the cath board.  I have reviewed the risks, indications, and alternatives to cardiac catheterization and possible angioplasty/stenting with the patient. Risks include but are not limited to bleeding, infection, vascular injury, stroke, myocardial infection, arrhythmia, kidney injury, radiation-related injury in the case of prolonged fluoroscopy use, emergency cardiac surgery, and death. The patient understands the risks of serious complication is low (<1%).   Possible PNA: CXR on admission showed atelectasis versus early pneumonia. Doxycycline was started after white count increased to 10.6  HTN: Blood pressure was elevated on admission at 187/97. Now with much better control. Continue amlodipine 10 mg, Clonidine 0.2 mg and lisinopril 20 mg.  HLD: Last lipid panel was in 2014 which showed an LDL of 125. Will obtain a lipid panel now as she is NPO  DMT2: Hold metformin as she is getting a heart catheterization. Continue SSI per primary team.  History of CVA (2014): History of left basal ganglia stroke found on  CT in 04/2012.  Home meds reveal that she is on aspirin or Plavix. She has been started on aspirin 325 mg daily. I will decrease this to 81 mg  Signed: Cline CrockKathryn Thompson, PA-C 11/21/2015 7:02 AM  Pager [phone]  Co-Sign MD   Agree with note by Carlean JewsKatie Thompson PA-C  Pt admitted with good story of accelerated [place]. Enz borderline +. Exam benign. Labs OK. For cardiac cath today.   Runell GessJonathan J. Telesha Deguzman, M.D., FACP, [facility] ServicesFACC, Earl LagosFAHA, [facility] SystemFSCAI [facility] [address] [place], [place]  [id number]  [phone] 11/21/2015 9:25 AM

## 2015-11-21 NOTE — Progress Notes (Signed)
TR BAND REMOVAL  LOCATION:    right radial  DEFLATED PER PROTOCOL:    Yes.    TIME BAND OFF / DRESSING APPLIED:    16:30   SITE UPON ARRIVAL:    Level 0  SITE AFTER BAND REMOVAL:    Level 0  CIRCULATION SENSATION AND MOVEMENT:    Within Normal Limits   Yes.    COMMENTS:   No hematoma; no bleeding. TR band instructions given. Pt. tolerated well.

## 2015-11-21 NOTE — Progress Notes (Signed)
   11/21/15 0946  Clinical Encounter Type  Visited With Patient;Health care provider  Visit Type Initial;Spiritual support;Pre-op  Referral From Patient;Nurse  Spiritual Encounters  Spiritual Needs Prayer;Emotional  Stress Factors  Patient Stress Factors Health changes   Chaplain responded to a consult. Patient is going to the Cath Lab soon, and expressed feeling overwhelmed. Patient's mom had cardiac issues as well, and so this hospitalization brings up some unpleasant reminders of that experience. Chaplain offered prayer and support. Chaplain introduced spiritual care services. Spiritual care services available as needed.   Alda PonderAdam M Dara Beidleman, Chaplain. 11/21/15 9:48 AM

## 2015-11-21 NOTE — Progress Notes (Signed)
Pt co cp, nitrox2 given, 4L oxygen started, ekg done, md contacted, no new orders, will continue to monitor pt closely

## 2015-11-21 NOTE — Progress Notes (Signed)
  Echocardiogram 2D Echocardiogram has been performed.  [name], [name] 11/21/2015, 3:18 PM

## 2015-11-21 NOTE — H&P (Signed)
History and Physical    Miranda [name] ZOX:[id number] DOB: May 19, 1955 DOA: 11/20/2015  PCP: Dorrene German, MD  Patient coming from: Home.  Chief Complaint: Chest pain.  HPI: Miranda [name] is a 60 y.o. female with diabetes mellitus type 2, hypertension, chronic pain, ongoing tobacco abuse and history of stroke presents to the ER because of chest pain. Patient states she has been having chest pain off and on for last 2 days. Pain is retrosternal pressure-like nonradiating sometimes burning in sensation with no nausea or vomiting. Pain increases on exertion and improves on resting. Also had some relief with sublingual nitroglycerin. Patient is being admitted for chest pain workup. Blood sugar was found to be markedly elevated but not in DKA.   ED Course: Sublingual nitroglycerin relieved the pain.  Review of Systems: As per HPI, rest all negative.   Past Medical History:  Diagnosis Date  . Acid reflux   . Anxiety   . Asthma   . Degenerative disc disease   . Depression   . Diabetes mellitus   . Hypertension   . Stroke [facility])    04/22/12    Past Surgical History:  Procedure Laterality Date  . KNEE SURGERY       reports that she has been smoking Cigarettes.  She has never used smokeless tobacco. She reports that she does not drink alcohol or use drugs.  No Known Allergies  Family History  Problem Relation Age of Onset  . Hypertension Other     Prior to Admission medications   Medication Sig Start Date End Date Taking? Authorizing Provider  albuterol (PROVENTIL HFA;VENTOLIN HFA) 108 (90 Base) MCG/ACT inhaler Inhale 2 puffs into the lungs every 6 (six) hours as needed for wheezing or shortness of breath.   Yes Historical Provider, MD  amLODipine (NORVASC) 10 MG tablet Take 10 mg by mouth every morning.    Yes Historical Provider, MD  cholecalciferol (VITAMIN D) 1000 UNITS tablet Take 3,000 Units by mouth daily.   Yes Historical Provider, MD  cloNIDine (CATAPRES) 0.2 MG  tablet Take 0.2 mg by mouth at bedtime.   Yes Historical Provider, MD  gabapentin (NEURONTIN) 300 MG capsule Take 300 mg by mouth 3 (three) times daily.    Yes Historical Provider, MD  hydrochlorothiazide (HYDRODIURIL) 25 MG tablet Take 25 mg by mouth every morning.    Yes Historical Provider, MD  ibuprofen (ADVIL,MOTRIN) 800 MG tablet Take 1 tablet (800 mg total) by mouth every 8 (eight) hours as needed for pain. 01/25/13  Yes Christopher Lawyer, PA-C  insulin glargine (LANTUS) 100 UNIT/ML injection Inject 40 Units into the skin 2 (two) times daily. Patient taking differently: Inject 50 Units into the skin 2 (two) times daily.  04/24/12  Yes Adeline C Viyuoh, MD  lisinopril (PRINIVIL,ZESTRIL) 20 MG tablet Take 20 mg by mouth at bedtime.    Yes Historical Provider, MD  metFORMIN (GLUMETZA) 1000 MG (MOD) 24 hr tablet Take 1,000 mg by mouth 2 (two) times daily with a meal.   Yes Historical Provider, MD  Oxycodone HCl 10 MG TABS Take 10 mg by mouth 3 (three) times daily as needed (pain).    Yes Ruby Cola, PA-C  oxymorphone (OPANA ER) 20 MG 12 hr tablet Take 20 mg by mouth every 12 (twelve) hours.   Yes Historical Provider, MD  saxagliptin HCl (ONGLYZA) 2.5 MG TABS tablet Take 5 mg by mouth every morning.   Yes Historical Provider, MD  traZODone (DESYREL) 50 MG tablet Take  50 mg by mouth at bedtime as needed for sleep.    Yes Historical Provider, MD  Vitamin D, Ergocalciferol, (DRISDOL) 50000 units CAPS capsule Take 50,000 Units by mouth every 7 (seven) days.   Yes Historical Provider, MD    Physical Exam: Vitals:   11/20/15 2230 11/20/15 2245 11/20/15 2300 11/21/15 0040  BP: 122/69 134/74 130/74 113/69  Pulse: 93 91 93 94  Resp: Temp:    98.4 F (36.9 C)  TempSrc:    Oral  SpO2: 99% 96% 94% 98%  Weight:    241 lb 4.8 oz (109.5 kg)  Height:     (1.778 m)      Constitutional: Not in distress. Vitals:   11/20/15 2230 11/20/15 2245 11/20/15 2300 11/21/15 0040    BP: 122/69 134/74 130/74 113/69  Pulse: 93 91 93 94  Resp: Temp:    98.4 F (36.9 C)  TempSrc:    Oral  SpO2: 99% 96% 94% 98%  Weight:    241 lb 4.8 oz (109.5 kg)  Height:     (1.778 m)   Eyes: Anicteric no pallor. ENMT: No discharge from the ears eyes nose or mouth. Neck: No mass felt. No JVD appreciated. Respiratory: No rhonchi or crepitations. Cardiovascular: S1 and S2 heard. Abdomen: Soft nontender bowel sounds present. No guarding or rigidity. Musculoskeletal: No edema. Skin: No rash. Neurologic: Alert awake oriented to time place and person. Moves all extremities. Psychiatric: Appears normal.   Labs on Admission: I have personally reviewed following labs and imaging studies  CBC:  Recent Labs Lab 11/20/15 1722  WBC 7.6  HGB 14.5  HCT 45.9  MCV 89.1  PLT 391   Basic Metabolic Panel:  Recent Labs Lab 11/20/15 1722  NA 137  K 3.6  CL 99*  CO2 28  GLUCOSE 407*  BUN 10  CREATININE 0.98  CALCIUM 9.3   GFR: Estimated Creatinine Clearance: 83.9 mL/min (by C-G formula based on SCr of 0.98 mg/dL). Liver Function Tests: No results for input(s): AST, ALT, ALKPHOS, BILITOT, PROT, ALBUMIN in the last 168 hours. No results for input(s): LIPASE, AMYLASE in the last 168 hours. No results for input(s): AMMONIA in the last 168 hours. Coagulation Profile: No results for input(s): INR, PROTIME in the last 168 hours. Cardiac Enzymes: No results for input(s): CKTOTAL, CKMB, CKMBINDEX, TROPONINI in the last 168 hours. BNP (last 3 results) No results for input(s): PROBNP in the last 8760 hours. HbA1C: No results for input(s): HGBA1C in the last 72 hours. CBG:  Recent Labs Lab 11/20/15 2318  GLUCAP 316*   Lipid Profile: No results for input(s): CHOL, HDL, LDLCALC, TRIG, CHOLHDL, LDLDIRECT in the last 72 hours. Thyroid Function Tests: No results for input(s): TSH, T4TOTAL, FREET4, T3FREE, THYROIDAB in the last 72 hours. Anemia Panel: No  results for input(s): VITAMINB12, FOLATE, FERRITIN, TIBC, IRON, RETICCTPCT in the last 72 hours. Urine analysis:    Component Value Date/Time   COLORURINE AMBER (A) 04/22/2012 1416   APPEARANCEUR CLOUDY (A) 04/22/2012 1416   LABSPEC 1.031 (H) 04/22/2012 1416   PHURINE 5.0 04/22/2012 1416   GLUCOSEU >1000 (A) 04/22/2012 1416   HGBUR NEGATIVE 04/22/2012 1416   BILIRUBINUR SMALL (A) 04/22/2012 1416   KETONESUR NEGATIVE 04/22/2012 1416   PROTEINUR NEGATIVE 04/22/2012 1416   UROBILINOGEN 1.0 04/22/2012 1416   NITRITE NEGATIVE 04/22/2012 1416   LEUKOCYTESUR NEGATIVE 04/22/2012 1416   Sepsis Labs: (procalcitonin:4,lacticidven:4) )No results found for  this or any previous visit (from the past 240 hour(s)).   Radiological Exams on Admission: Dg Chest 2 View  Result Date: 11/20/2015 CLINICAL DATA:  Chest pain and shortness of breath for 2 days. Cough for 1 week. EXAM: CHEST  2 VIEW COMPARISON:  Radiograph 04/20/2015 FINDINGS: The cardiomediastinal contours are normal. Streaky opacities at the left lung base. Pulmonary vasculature is normal. No pleural effusion or pneumothorax. No acute osseous abnormalities are seen. IMPRESSION: Streaky left basilar opacities, favor atelectasis over early pneumonia. Electronically Signed   By: Rubye OaksMelanie  Ehinger M.D.   On: 11/20/2015 18:28    EKG: Independently reviewed. Normal sinus rhythm.  Assessment/Plan Principal Problem:   Pain in the chest Active Problems:   Essential hypertension   Type 2 diabetes mellitus with hyperglycemia (HCC)   Hyperlipidemia    1. Chest pain - given risk factors including diabetes mellitus type 2, hypertension ongoing tobacco abuse patient will be admitted for chest pain rule out. Cycle cardiac markers check 2-D echo d-dimer aspirin when necessary nitroglycerin. Will be kept nothing by mouth in anticipation of cardiac procedure. Consult cardiology in a.m. 2. Diabetes mellitus type 2 with hyperglycemia not in DKA -  patient's home dose of Lantus 50 units was given in the ER. We will continue with Lantus 50 twice a day with sliding-scale coverage. On OnglyzaClosely follow CBGs. Gently hydrate. Hold metformin. 3. Hypertension - since patient is receiving IV fluids will hold hydrochlorothiazide. Continue lisinopril Norvasc and clonidine. 4. Chronic pain - continue home medications. 5. Tobacco abuse - tobacco cessation counseling requested.  Chest x-ray was showing features for possible infiltrate. Patient does not have any definite evidence of pneumonia. For now I have placed patient on doxycycline and check procalcitonin levels. If negative may discontinue antibiotics.   DVT prophylaxis: Lovenox. Code Status: Full code.  Family Communication: Discussed with patient.  Disposition Plan: Home.  Consults called: None.  Admission status: Observation. Telemetry.    Eduard ClosKAKRAKANDY,Oria Klimas N. MD [facility] Pager [phone].  If 7PM-7AM, please contact night-coverage [email or web address] Password [email or web address]  11/21/2015, 2:23 AM

## 2015-11-22 DIAGNOSIS — I10 Essential (primary) hypertension: Secondary | ICD-10-CM

## 2015-11-22 DIAGNOSIS — I214 Non-ST elevation (NSTEMI) myocardial infarction: Principal | ICD-10-CM

## 2015-11-22 LAB — HEMOGLOBIN A1C
HEMOGLOBIN A1C: 11.2 % — AB (ref 4.8–5.6)
Mean Plasma Glucose: 275 mg/dL

## 2015-11-22 LAB — BASIC METABOLIC PANEL
ANION GAP: 5 (ref 5–15)
BUN: 13 mg/dL (ref 6–20)
CALCIUM: 8.5 mg/dL — AB (ref 8.9–10.3)
CHLORIDE: 102 mmol/L (ref 101–111)
CO2: 30 mmol/L (ref 22–32)
CREATININE: 1.2 mg/dL — AB (ref 0.44–1.00)
GFR, EST AFRICAN AMERICAN: 57 mL/min — AB (ref >60)
GFR, EST NON AFRICAN AMERICAN: 49 mL/min — AB (ref >60)
GLUCOSE: 160 mg/dL — AB (ref 65–99)
POTASSIUM: 3.8 mmol/L (ref 3.5–5.1)
Sodium: 137 mmol/L (ref 135–145)

## 2015-11-22 LAB — CBC
HCT: 40.7 % (ref 36.0–46.0)
Hemoglobin: 12.2 g/dL (ref 12.0–15.0)
MCH: 27.5 pg (ref 26.0–34.0)
MCHC: 30 g/dL (ref 30.0–36.0)
MCV: 91.9 fL (ref 78.0–100.0)
Platelets: 303 10*3/uL (ref 150–400)
RBC: 4.43 MIL/uL (ref 3.87–5.11)
RDW: 13.9 % (ref 11.5–15.5)
WBC: 9.8 10*3/uL (ref 4.0–10.5)

## 2015-11-22 LAB — GLUCOSE, CAPILLARY: GLUCOSE-CAPILLARY: 166 mg/dL — AB (ref 65–99)

## 2015-11-22 MED ORDER — LISINOPRIL 20 MG PO TABS: 20.0000 mg | ORAL_TABLET | Freq: Every day | ORAL | Status: DC

## 2015-11-22 MED ORDER — ANGIOPLASTY BOOK
Freq: Once | Status: DC
  Filled 2015-11-22: qty 1

## 2015-11-22 MED ORDER — CARVEDILOL 3.125 MG PO TABS: 3.1250 mg | ORAL_TABLET | Freq: Two times a day (BID) | ORAL | Status: DC

## 2015-11-22 MED ORDER — TICAGRELOR 90 MG PO TABS: 90.0000 mg | ORAL_TABLET | Freq: Two times a day (BID) | ORAL | 0 refills | Status: DC

## 2015-11-22 MED ORDER — CARVEDILOL 3.125 MG PO TABS: 3.1250 mg | ORAL_TABLET | Freq: Two times a day (BID) | ORAL | 0 refills | Status: DC

## 2015-11-22 MED ORDER — ATORVASTATIN CALCIUM 80 MG PO TABS: 80.0000 mg | ORAL_TABLET | Freq: Every day | ORAL | 0 refills | Status: DC

## 2015-11-22 MED ORDER — ATORVASTATIN CALCIUM 80 MG PO TABS: 80.0000 mg | ORAL_TABLET | Freq: Every day | ORAL | Status: DC

## 2015-11-22 MED ORDER — OXYMORPHONE HCL ER 20 MG PO TB12: 20.0000 mg | ORAL_TABLET | Freq: Two times a day (BID) | ORAL | 0 refills | Status: DC

## 2015-11-22 MED ORDER — ASPIRIN 81 MG PO CHEW: 81.0000 mg | CHEWABLE_TABLET | Freq: Every day | ORAL | Status: DC

## 2015-11-22 MED ORDER — METFORMIN HCL ER (MOD) 1000 MG PO TB24: 1000.0000 mg | ORAL_TABLET | Freq: Two times a day (BID) | ORAL | Status: DC

## 2015-11-22 NOTE — Discharge Summary (Signed)
Physician Discharge Summary  Miranda [name] ZOX:[id number] DOB: 23-Nov-1955 DOA: 11/20/2015  PCP: Dorrene German, MD  Admit date: 11/20/2015 Discharge date: 11/22/2015  Time spent: 45 minutes  Recommendations for Outpatient Follow-up:  [facility] on 8/24 Dr.Berry, Cardiology on 9/26 PCP Dr.Avbuere in 1 week  Discharge Diagnoses:  Principal Problem:   Pain in the chest Active Problems:   Essential hypertension   Type 2 diabetes mellitus with hyperglycemia (HCC)   Hyperlipidemia   NSTEMI (non-ST elevated myocardial infarction) [facility])   Chest pain   Discharge Condition: stable  Diet recommendation: diabetic, heart healthy  Filed Weights   11/20/15 1715 11/21/15 0040 11/22/15 0508  Weight: 106.6 kg (235 lb) 109.5 kg (241 lb 4.8 oz) 110 kg (242 lb 8.1 oz)    History of present illness:  Miranda [name] is a 60 y.o. female with diabetes mellitus type 2, hypertension, chronic pain, ongoing tobacco abuse and history of stroke presented to the ER because of chest pain off and on for last 2 days. Pain is retrosternal pressure-like nonradiating sometimes burning in sensation with no nausea or vomiting. Pain increased on exertion and improved on resting. Also had some relief with sublingual nitroglycerin.  Hospital Course:  1. NSTEMI/Chest pain - multiple cardiac risk factors including diabetes mellitus type 2, hypertension ongoing tobacco abuse patient was admitted, there was mild bump in troponin, she was seen by Cardiology and underwent Left heart cath, showed 90% left CFX stenosis sp, DES/stenting yesterday. -She was started on ASA/Brillinta/Coreg/Lipitor, was stable post stenting and cleared for discharge by Dr.Berry today, we have made her FU with Cardiology.  2. Diabetes mellitus type 2 with hyperglycemia -continued on lantus, SSI  3. Hypertension - resumed lisinopril , clonidine, started on coreg and Norvasc stopped  4. Chronic pain - continue home  medications.  5.   Tobacco abuse - tobacco cessation counseling requested  Procedures: Left Heart cath Conclusion     Dist LAD lesion, 60 %stenosed.  There is hyperdynamic left ventricular systolic function.  LV end diastolic pressure is moderately elevated.  The left ventricular ejection fraction is greater than 65% by visual estimate.  A STENT SYNERGY DES 3.5X12 drug eluting stent was successfully placed.  Prox Cx lesion, 90 %stenosed.  Post intervention, there is a 0% residual stenosis.   1. 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 2. Hyperdynamic LV function 3. Successful stenting of the proximal LCx with a DES      Consultations:  Cardiology  Discharge Exam: Vitals:   11/22/15 0748 11/22/15 0948  BP: (!) 111/55 (!) 144/60  Pulse: 84   Resp: 11   Temp: 99 F (37.2 C)     General: AAOx3 Cardiovascular: S1S2/RRR Respiratory: CTAB  Discharge Instructions   Discharge Instructions    AMB Referral to Cardiac Rehabilitation - Phase II    Complete by:  As directed   Diagnosis:  NSTEMI   Amb Referral to Cardiac Rehabilitation    Complete by:  As directed   Diagnosis:   NSTEMI Coronary Stents     Diet - low sodium heart healthy    Complete by:  As directed   Diet Carb Modified    Complete by:  As directed   Increase activity slowly    Complete by:  As directed     Current Discharge Medication List    START taking these medications   Details  aspirin 81 MG chewable tablet Chew 1  tablet (81 mg total) by mouth daily.    atorvastatin (LIPITOR) 80 MG tablet Take 1 tablet (80 mg total) by mouth daily at 6 PM. Qty: 30 tablet, Refills: 0    carvedilol (COREG) 3.125 MG tablet Take 1 tablet (3.125 mg total) by mouth 2 (two) times daily with a meal. Qty: 60 tablet, Refills: 0    ticagrelor (BRILINTA) 90 MG TABS tablet Take 1 tablet (90 mg total) by mouth 2 (two) times daily. Qty: 60 tablet, Refills: 0       CONTINUE these medications which have CHANGED   Details  lisinopril (PRINIVIL,ZESTRIL) 20 MG tablet Take 1 tablet (20 mg total) by mouth at bedtime. Restart tomorrow    metFORMIN (GLUMETZA) 1000 MG (MOD) 24 hr tablet Take 1 tablet (1,000 mg total) by mouth 2 (two) times daily with a meal. Restart tomorrow    oxymorphone (OPANA ER) 20 MG 12 hr tablet Take 1 tablet (20 mg total) by mouth every 12 (twelve) hours. Qty: 10 tablet, Refills: 0      CONTINUE these medications which have NOT CHANGED   Details  albuterol (PROVENTIL HFA;VENTOLIN HFA) 108 (90 Base) MCG/ACT inhaler Inhale 2 puffs into the lungs every 6 (six) hours as needed for wheezing or shortness of breath.    cholecalciferol (VITAMIN D) 1000 UNITS tablet Take 3,000 Units by mouth daily.    cloNIDine (CATAPRES) 0.2 MG tablet Take 0.2 mg by mouth at bedtime.    gabapentin (NEURONTIN) 300 MG capsule Take 300 mg by mouth 3 (three) times daily.     hydrochlorothiazide (HYDRODIURIL) 25 MG tablet Take 25 mg by mouth every morning.     insulin glargine (LANTUS) 100 UNIT/ML injection Inject 40 Units into the skin 2 (two) times daily. Qty: 10 mL    Oxycodone HCl 10 MG TABS Take 10 mg by mouth 3 (three) times daily as needed (pain).     saxagliptin HCl (ONGLYZA) 2.5 MG TABS tablet Take 5 mg by mouth every morning.    traZODone (DESYREL) 50 MG tablet Take 50 mg by mouth at bedtime as needed for sleep.     Vitamin D, Ergocalciferol, (DRISDOL) 50000 units CAPS capsule Take 50,000 Units by mouth every 7 (seven) days.      STOP taking these medications     amLODipine (NORVASC) 10 MG tablet      ibuprofen (ADVIL,MOTRIN) 800 MG tablet        No Known Allergies Follow-up Information    Nanetta Batty, MD. Go on 01/08/2016.   Specialties:  Cardiology, Radiology Why:  @ 3:30pm Contact information: [address] [place] [address] [phone]        Robbie Lis, PA-C. Go on 12/06/2015.    Specialties:  Cardiology, Radiology Why:  @ 8:30am Contact information: [address] [place] [address] [phone]        Dorrene German, MD. Schedule an appointment as soon as possible for a visit in 1 week(s).   Specialty:  Internal Medicine Contact information: [address] [place] [address] [phone]            The results of significant diagnostics from this hospitalization (including imaging, microbiology, ancillary and laboratory) are listed below for reference.    Significant Diagnostic Studies: Dg Chest 2 View  Result Date: 11/20/2015 CLINICAL DATA:  Chest pain and shortness of breath for 2 days. Cough for 1 week. EXAM: CHEST  2 VIEW COMPARISON:  Radiograph 04/20/2015 FINDINGS: The cardiomediastinal contours are  normal. Streaky opacities at the left lung base. Pulmonary vasculature is normal. No pleural effusion or pneumothorax. No acute osseous abnormalities are seen. IMPRESSION: Streaky left basilar opacities, favor atelectasis over early pneumonia. Electronically Signed   By: Rubye OaksMelanie  Ehinger M.D.   On: 11/20/2015 18:28    Microbiology: No results found for this or any previous visit (from the past 240 hour(s)).   Labs: Basic Metabolic Panel:  Recent Labs Lab 11/20/15 1722 11/21/15 0310 11/21/15 1401 11/22/15 0445  NA 137  --   --  137  K 3.6  --   --  3.8  CL 99*  --   --  102  CO2 28  --   --  30  GLUCOSE 407*  --   --  160*  BUN 10  --   --  13  CREATININE 0.98 0.83 0.68 1.20*  CALCIUM 9.3  --   --  8.5*   Liver Function Tests: No results for input(s): AST, ALT, ALKPHOS, BILITOT, PROT, ALBUMIN in the last 168 hours. No results for input(s): LIPASE, AMYLASE in the last 168 hours. No results for input(s): AMMONIA in the last 168 hours. CBC:  Recent Labs Lab 11/20/15 1722 11/21/15 0310 11/21/15 1401 11/22/15 0445  WBC 7.6 10.6* 9.3 9.8  HGB 14.5 12.8 12.7 12.2  HCT 45.9 40.9 41.4 40.7  MCV 89.1 89.1 90.2  91.9  PLT 391 350 332 303   Cardiac Enzymes:  Recent Labs Lab 11/21/15 0310 11/21/15 0737 11/21/15 1401  TROPONINI 0.07* 0.08* 0.23*   BNP: BNP (last 3 results) No results for input(s): BNP in the last 8760 hours.  ProBNP (last 3 results) No results for input(s): PROBNP in the last 8760 hours.  CBG:  Recent Labs Lab 11/21/15 0731 11/21/15 1242 11/21/15 1715 11/21/15 2213 11/22/15 0551  GLUCAP 307* 162* 149* 164* 166*       SignedZannie Cove:  Eureka Valdes MD.  [facility] 11/22/2015, 11:22 AM

## 2015-11-22 NOTE — Care Management Note (Signed)
Case Management Note  Patient Details  Name: Miranda ButtersChandra D [name] MRN: [id number] Date of Birth: 19-Apr-1955  Subjective/Objective:   Patient lives with friend, pta indep.  She has medicaid and NCM gave her a Brilinta card.  She will go to [facility] on [address] to get 30 days free.  She is for dc today.                 Action/Plan:   Expected Discharge Date:                  Expected Discharge Plan:  Home/Self Care  In-House Referral:     Discharge planning Services  CM Consult  Post Acute Care Choice:    Choice offered to:     DME Arranged:    DME Agency:     HH Arranged:    HH Agency:     Status of Service:  Completed, signed off  If discussed at [employer] Length of Stay Meetings, dates discussed:    Additional Comments:  Leone Havenaylor, Tabor Bartram Clinton, RN 11/22/2015, 12:36 PM

## 2015-11-22 NOTE — Progress Notes (Signed)
Pt given all dischg instructions and questions answered with pt's understanding.  All belongings with pt and discharged via wc with husband.

## 2015-11-22 NOTE — Progress Notes (Signed)
CARDIAC REHAB PHASE I   PRE:  Rate/Rhythm: 88 SR  BP:  Sitting: 111/53        SaO2: 94 RA  MODE:  Ambulation: 120 ft   POST:  Rate/Rhythm: 102 ST  BP:  Sitting: 144/60         SaO2: 92 RA  Pt ambulated 120 ft on RA, handheld assist, fairly steady gait, tolerated fair.  Pt c/o of general fatigue, back pain, denies cp, dizziness, declined rest stop. Pt is sedentary at baseline, has chronic back pain. Completed MI/stent education.  Reviewed risk factors, tobacco cessation (gave pt fake cigarette), MI book, anti-platelet therapy, stent card, activity restrictions, ntg, exercise, heart healthy diet, carb counting, portion control, and phase 2 cardiac rehab. Pt verbalized understanding, somewhat receptive to education, flat affect. In reference to her diet, pt states she has been under a lot of stress and when she is stressed she "eats sweets." Pt agrees to phase 2 cardiac rehab referral, [facility]. Pt to see case manager regarding brilinta prior to discharge. Pt to bed per pt request after walk, call bell within reach.   [phone] Joylene GrapesEmily C Genean Adamski, RN, BSN 11/22/2015 9:18 AM

## 2015-11-22 NOTE — Progress Notes (Signed)
Subjective:  No CP/SOB, S/P DES LCX, Nl LV FXN  Objective:  Temp:  [98 F (36.7 C)-99 F (37.2 C)] 99 F (37.2 C) (08/10 0748) Pulse Rate:  [0-122] 84 (08/10 0748) Resp:  [7-35] 11 (08/10 0748) BP: (95-139)/(48-86) 111/55 (08/10 0748) SpO2:  [93 %-100 %] 93 % (08/10 0748) Weight:  [242 lb 8.1 oz (110 kg)] 242 lb 8.1 oz (110 kg) (08/10 0508) Weight change: 7 lb 8.1 oz (3.405 kg)  Intake/Output from previous day: 08/09 0701 - 08/10 0700 In: 1124.3 [P.O.:960; I.V.:164.3] Out: 1200 [Urine:1200]  Intake/Output from this shift: No intake/output data recorded.  Physical Exam: General appearance: alert and no distress Neck: no adenopathy, no carotid bruit, no JVD, supple, symmetrical, trachea midline and thyroid not enlarged, symmetric, no tenderness/mass/nodules Lungs: clear to auscultation bilaterally Heart: regular rate and rhythm, S1, S2 normal, no murmur, click, rub or gallop Extremities: extremities normal, atraumatic, no cyanosis or edema  Lab Results: Results for orders placed or performed during the hospital encounter of 11/20/15 (from the past 48 hour(s))  Basic metabolic panel     Status: Abnormal   Collection Time: 11/20/15  5:22 PM  Result Value Ref Range   Sodium 137 135 - 145 mmol/L   Potassium 3.6 3.5 - 5.1 mmol/L   Chloride 99 (L) 101 - 111 mmol/L   CO2 28 22 - 32 mmol/L   Glucose, Bld 407 (H) 65 - 99 mg/dL   BUN 10 6 - 20 mg/dL   Creatinine, Ser 0.98 0.44 - 1.00 mg/dL   Calcium 9.3 8.9 - 10.3 mg/dL   GFR calc non Af Amer >60 >60 mL/min   GFR calc Af Amer >60 >60 mL/min    Comment: (NOTE) The eGFR has been calculated using the CKD EPI equation. This calculation has not been validated in all clinical situations. eGFR's persistently <60 mL/min signify possible Chronic Kidney Disease.    Anion gap 10 5 - 15  CBC     Status: Abnormal   Collection Time: 11/20/15  5:22 PM  Result Value Ref Range   WBC 7.6 4.0 - 10.5 K/uL   RBC 5.15 (H) 3.87 - 5.11  MIL/uL   Hemoglobin 14.5 12.0 - 15.0 g/dL   HCT 45.9 36.0 - 46.0 %   MCV 89.1 78.0 - 100.0 fL   MCH 28.2 26.0 - 34.0 pg   MCHC 31.6 30.0 - 36.0 g/dL   RDW 13.3 11.5 - 15.5 %   Platelets 391 150 - 400 K/uL  I-stat troponin, ED     Status: None   Collection Time: 11/20/15  5:27 PM  Result Value Ref Range   Troponin i, poc 0.02 0.00 - 0.08 ng/mL   Comment 3            Comment: Due to the release kinetics of cTnI, a negative result within the first hours of the onset of symptoms does not rule out myocardial infarction with certainty. If myocardial infarction is still suspected, repeat the test at appropriate intervals.   D-dimer, quantitative (not at [facility])     Status: None   Collection Time: 11/20/15  9:23 PM  Result Value Ref Range   D-Dimer, Quant 0.28 0.00 - 0.50 ug/mL-FEU    Comment: (NOTE) At the manufacturer cut-off of 0.50 ug/mL FEU, this assay has been documented to exclude PE with a sensitivity and negative predictive value of 97 to 99%.  At this time, this assay has not been approved by the FDA to  exclude DVT/VTE. Results should be correlated with clinical presentation. CORRECTED ON 08/09 AT 0016: PREVIOUSLY REPORTED AS 0.28   I-stat troponin, ED     Status: None   Collection Time: 11/20/15  9:29 PM  Result Value Ref Range   Troponin i, poc 0.02 0.00 - 0.08 ng/mL   Comment 3            Comment: Due to the release kinetics of cTnI, a negative result within the first hours of the onset of symptoms does not rule out myocardial infarction with certainty. If myocardial infarction is still suspected, repeat the test at appropriate intervals.   CBG monitoring, ED     Status: Abnormal   Collection Time: 11/20/15 11:18 PM  Result Value Ref Range   Glucose-Capillary 316 (H) 65 - 99 mg/dL  D-dimer, quantitative (not at [facility])     Status: None   Collection Time: 11/21/15  3:10 AM  Result Value Ref Range   D-Dimer, Quant 0.29 0.00 - 0.50 ug/mL-FEU    Comment: (NOTE) At  the manufacturer cut-off of 0.50 ug/mL FEU, this assay has been documented to exclude PE with a sensitivity and negative predictive value of 97 to 99%.  At this time, this assay has not been approved by the FDA to exclude DVT/VTE. Results should be correlated with clinical presentation.   Troponin I (q 6hr x 3)     Status: Abnormal   Collection Time: 11/21/15  3:10 AM  Result Value Ref Range   Troponin I 0.07 (HH) <0.03 ng/mL    Comment: CRITICAL RESULT CALLED TO, READ BACK BY AND VERIFIED WITH: B BUCKNER,RN 11/21/15 0423 RHOLMES   CBC     Status: Abnormal   Collection Time: 11/21/15  3:10 AM  Result Value Ref Range   WBC 10.6 (H) 4.0 - 10.5 K/uL   RBC 4.59 3.87 - 5.11 MIL/uL   Hemoglobin 12.8 12.0 - 15.0 g/dL   HCT 40.9 36.0 - 46.0 %   MCV 89.1 78.0 - 100.0 fL   MCH 27.9 26.0 - 34.0 pg   MCHC 31.3 30.0 - 36.0 g/dL   RDW 13.5 11.5 - 15.5 %   Platelets 350 150 - 400 K/uL  Creatinine, serum     Status: None   Collection Time: 11/21/15  3:10 AM  Result Value Ref Range   Creatinine, Ser 0.83 0.44 - 1.00 mg/dL   GFR calc non Af Amer >60 >60 mL/min   GFR calc Af Amer >60 >60 mL/min    Comment: (NOTE) The eGFR has been calculated using the CKD EPI equation. This calculation has not been validated in all clinical situations. eGFR's persistently <60 mL/min signify possible Chronic Kidney Disease.   Protime-INR     Status: None   Collection Time: 11/21/15  3:10 AM  Result Value Ref Range   Prothrombin Time 12.5 11.4 - 15.2 seconds   INR 0.93   Glucose, capillary     Status: Abnormal   Collection Time: 11/21/15  7:31 AM  Result Value Ref Range   Glucose-Capillary 307 (H) 65 - 99 mg/dL  Troponin I (q 6hr x 3)     Status: Abnormal   Collection Time: 11/21/15  7:37 AM  Result Value Ref Range   Troponin I 0.08 (HH) <0.03 ng/mL    Comment: CRITICAL VALUE NOTED.  VALUE IS CONSISTENT WITH PREVIOUSLY REPORTED AND CALLED VALUE.  Procalcitonin - Baseline     Status: None   Collection  Time: 11/21/15  7:37 AM  Result Value Ref Range   Procalcitonin <0.10 ng/mL    Comment:        Interpretation: PCT (Procalcitonin) <= 0.5 ng/mL: Systemic infection (sepsis) is not likely. Local bacterial infection is possible. REPEATED TO VERIFY (NOTE)         ICU PCT Algorithm               Non ICU PCT Algorithm    ----------------------------     ------------------------------         PCT < 0.25 ng/mL                 PCT < 0.1 ng/mL     Stopping of antibiotics            Stopping of antibiotics       strongly encouraged.               strongly encouraged.    ----------------------------     ------------------------------       PCT level decrease by               PCT < 0.25 ng/mL       >= 80% from peak PCT       OR PCT 0.25 - 0.5 ng/mL          Stopping of antibiotics                                             encouraged.     Stopping of antibiotics           encouraged.    ----------------------------     ------------------------------       PCT level decrease by              PCT >= 0.25 ng/mL       < 80% from peak PCT        AND PCT >= 0.5 ng/mL             Continuing antibiotics                                              encouraged.       Continuing antibiotics            encouraged.    ----------------------------     ------------------------------     PCT level increase compared          PCT > 0.5 ng/mL         with peak PCT AND          PCT >= 0.5 ng/mL             Escalation of antibiotics                                          strongly encouraged.      Escalation of antibiotics        strongly encouraged.   Lipid panel     Status: Abnormal   Collection Time: 11/21/15  7:37 AM  Result Value Ref Range   Cholesterol 210 (H) 0 - 200 mg/dL   Triglycerides 95 <150 mg/dL  HDL 33 (L) >40 mg/dL   Total CHOL/HDL Ratio 6.4 RATIO   VLDL 19 0 - 40 mg/dL   LDL Cholesterol 158 (H) 0 - 99 mg/dL    Comment:        Total Cholesterol/HDL:CHD Risk Coronary Heart Disease  Risk Table                     Men   Women  1/2 Average Risk   3.4   3.3  Average Risk       5.0   4.4  2 X Average Risk   9.6   7.1  3 X Average Risk  23.4   11.0        Use the calculated Patient Ratio above and the CHD Risk Table to determine the patient's CHD Risk.        ATP III CLASSIFICATION (LDL):  <100     mg/dL   Optimal  100-129  mg/dL   Near or Above                    Optimal  130-159  mg/dL   Borderline  160-189  mg/dL   High  >190     mg/dL   Very High   Hemoglobin A1c     Status: Abnormal   Collection Time: 11/21/15  7:37 AM  Result Value Ref Range   Hgb A1c MFr Bld 11.2 (H) 4.8 - 5.6 %    Comment: (NOTE)         Pre-diabetes: 5.7 - 6.4         Diabetes: >6.4         Glycemic control for adults with diabetes: <7.0    Mean Plasma Glucose 275 mg/dL    Comment: (NOTE) Performed At: [facility] [place], [place] [id number] Lindon Romp MD [id number]   POCT Activated clotting time     Status: None   Collection Time: 11/21/15 11:16 AM  Result Value Ref Range   Activated Clotting Time 488 seconds  Glucose, capillary     Status: Abnormal   Collection Time: 11/21/15 12:42 PM  Result Value Ref Range   Glucose-Capillary 162 (H) 65 - 99 mg/dL  Troponin I (q 6hr x 3)     Status: Abnormal   Collection Time: 11/21/15  2:01 PM  Result Value Ref Range   Troponin I 0.23 (HH) <0.03 ng/mL    Comment: CRITICAL VALUE NOTED.  VALUE IS CONSISTENT WITH PREVIOUSLY REPORTED AND CALLED VALUE.  CBC     Status: None   Collection Time: 11/21/15  2:01 PM  Result Value Ref Range   WBC 9.3 4.0 - 10.5 K/uL   RBC 4.59 3.87 - 5.11 MIL/uL   Hemoglobin 12.7 12.0 - 15.0 g/dL   HCT 41.4 36.0 - 46.0 %   MCV 90.2 78.0 - 100.0 fL   MCH 27.7 26.0 - 34.0 pg   MCHC 30.7 30.0 - 36.0 g/dL   RDW 13.7 11.5 - 15.5 %   Platelets 332 150 - 400 K/uL  Creatinine, serum     Status: None   Collection Time: 11/21/15  2:01 PM  Result Value Ref Range   Creatinine, Ser  0.68 0.44 - 1.00 mg/dL   GFR calc non Af Amer >60 >60 mL/min   GFR calc Af Amer >60 >60 mL/min    Comment: (NOTE) The eGFR has been calculated using the CKD EPI equation. This calculation has not been validated  in all clinical situations. eGFR's persistently <60 mL/min signify possible Chronic Kidney Disease.   Glucose, capillary     Status: Abnormal   Collection Time: 11/21/15  5:15 PM  Result Value Ref Range   Glucose-Capillary 149 (H) 65 - 99 mg/dL  Glucose, capillary     Status: Abnormal   Collection Time: 11/21/15 10:13 PM  Result Value Ref Range   Glucose-Capillary 164 (H) 65 - 99 mg/dL   Comment 1 Notify RN    Comment 2 Document in Chart   Basic metabolic panel     Status: Abnormal   Collection Time: 11/22/15  4:45 AM  Result Value Ref Range   Sodium 137 135 - 145 mmol/L   Potassium 3.8 3.5 - 5.1 mmol/L   Chloride 102 101 - 111 mmol/L   CO2 30 22 - 32 mmol/L   Glucose, Bld 160 (H) 65 - 99 mg/dL   BUN 13 6 - 20 mg/dL   Creatinine, Ser 1.20 (H) 0.44 - 1.00 mg/dL   Calcium 8.5 (L) 8.9 - 10.3 mg/dL   GFR calc non Af Amer 49 (L) >60 mL/min   GFR calc Af Amer 57 (L) >60 mL/min    Comment: (NOTE) The eGFR has been calculated using the CKD EPI equation. This calculation has not been validated in all clinical situations. eGFR's persistently <60 mL/min signify possible Chronic Kidney Disease.    Anion gap 5 5 - 15  CBC     Status: None   Collection Time: 11/22/15  4:45 AM  Result Value Ref Range   WBC 9.8 4.0 - 10.5 K/uL   RBC 4.43 3.87 - 5.11 MIL/uL   Hemoglobin 12.2 12.0 - 15.0 g/dL   HCT 40.7 36.0 - 46.0 %   MCV 91.9 78.0 - 100.0 fL   MCH 27.5 26.0 - 34.0 pg   MCHC 30.0 30.0 - 36.0 g/dL   RDW 13.9 11.5 - 15.5 %   Platelets 303 150 - 400 K/uL  Glucose, capillary     Status: Abnormal   Collection Time: 11/22/15  5:51 AM  Result Value Ref Range   Glucose-Capillary 166 (H) 65 - 99 mg/dL   Comment 1 Notify RN    Comment 2 Document in Chart     Imaging: Imaging  results have been reviewed  Tele- NSR  Assessment/Plan:   1. Principal Problem: 2.   Pain in the chest 3. Active Problems: 4.   Essential hypertension 5.   Type 2 diabetes mellitus with hyperglycemia (HCC) 6.   Hyperlipidemia 7.   NSTEMI (non-ST elevated myocardial infarction) ([place]) 8.   Chest pain 9.   Time Spent Directly with Patient:  20 minutes  Length of Stay:  LOS: 1 day   POD #1 DES LCX. Mod/ non crit mid LAD Dx. Nl LV Fxn. Trop mildly elevated .23. On DAPT ( Brilenta). Exam benign. LAbs OK. LDL 158---> Needs to be on high dose Statin. Prob home this AM. Will arrange OP F/O with a MLP 2-3, me in 6-8 weeks  Quay Burow 11/22/2015, 8:12 AM

## 2015-12-06 ENCOUNTER — Ambulatory Visit: Payer: Medicaid Other | Admitting: Cardiology

## 2015-12-28 ENCOUNTER — Other Ambulatory Visit: Payer: Self-pay | Admitting: *Deleted

## 2015-12-28 MED ORDER — LISINOPRIL 20 MG PO TABS: 20.0000 mg | ORAL_TABLET | Freq: Every day | ORAL | 0 refills | Status: DC

## 2015-12-28 MED ORDER — CARVEDILOL 3.125 MG PO TABS: 3.1250 mg | ORAL_TABLET | Freq: Two times a day (BID) | ORAL | 0 refills | Status: DC

## 2015-12-28 MED ORDER — HYDROCHLOROTHIAZIDE 25 MG PO TABS: 25.0000 mg | ORAL_TABLET | Freq: Every day | ORAL | 0 refills | Status: DC

## 2015-12-28 MED ORDER — ATORVASTATIN CALCIUM 80 MG PO TABS: 80.0000 mg | ORAL_TABLET | Freq: Every day | ORAL | 0 refills | Status: DC

## 2015-12-28 MED ORDER — TICAGRELOR 90 MG PO TABS: 90.0000 mg | ORAL_TABLET | Freq: Two times a day (BID) | ORAL | 0 refills | Status: DC

## 2016-01-01 ENCOUNTER — Telehealth: Payer: Self-pay | Admitting: *Deleted

## 2016-01-01 NOTE — Telephone Encounter (Signed)
[facility] Phase 2 for Qualified Medicaid Patients form signed by Dr Allyson SabalBerry and faxed successfully. "Acute Myocardial Infarction and Percutaneous transluminal coronary angioplasty or coronary artery stenting" areas each checked on form.

## 2016-01-03 ENCOUNTER — Ambulatory Visit (INDEPENDENT_AMBULATORY_CARE_PROVIDER_SITE_OTHER): Payer: Medicaid Other | Admitting: Cardiology

## 2016-01-03 ENCOUNTER — Other Ambulatory Visit: Payer: Self-pay | Admitting: *Deleted

## 2016-01-03 ENCOUNTER — Encounter: Payer: Self-pay | Admitting: Cardiology

## 2016-01-03 ENCOUNTER — Encounter (INDEPENDENT_AMBULATORY_CARE_PROVIDER_SITE_OTHER): Payer: Self-pay

## 2016-01-03 VITALS — BP 160/100 | HR 94 | Ht 70.0 in | Wt 238.8 lb

## 2016-01-03 DIAGNOSIS — I10 Essential (primary) hypertension: Secondary | ICD-10-CM | POA: Diagnosis not present

## 2016-01-03 DIAGNOSIS — I214 Non-ST elevation (NSTEMI) myocardial infarction: Secondary | ICD-10-CM

## 2016-01-03 DIAGNOSIS — E876 Hypokalemia: Secondary | ICD-10-CM

## 2016-01-03 DIAGNOSIS — E785 Hyperlipidemia, unspecified: Secondary | ICD-10-CM

## 2016-01-03 MED ORDER — CARVEDILOL 6.25 MG PO TABS: 3.1250 mg | ORAL_TABLET | Freq: Two times a day (BID) | ORAL | 1 refills | Status: DC

## 2016-01-03 MED ORDER — NITROGLYCERIN 0.4 MG SL SUBL: 0.4000 mg | SUBLINGUAL_TABLET | SUBLINGUAL | 3 refills | Status: DC | PRN

## 2016-01-03 MED ORDER — CARVEDILOL 6.25 MG PO TABS: 6.2500 mg | ORAL_TABLET | Freq: Two times a day (BID) | ORAL | 3 refills | Status: DC

## 2016-01-03 NOTE — Progress Notes (Signed)
01/03/2016 Miranda ButtersChandra D [name]   [birth year]  [id number]  Primary Physician Dorrene GermanEdwin A Avbuere, MD Primary Cardiologist: Dr. Allyson SabalBerry   Reason for Visit/CC: Post hospital follow-up for CAD  HPI:  The patient is a 60 year old female with a history of obesity, type 2 diabetes, hyperlipidemia, hypertension, tobacco abuse, previous CVA and a family history of CAD, who [facility]/2015 with complaint of chest pain. He ruled in for non-STEMI with that troponin level peaking at 0.23. Chest x-ray was also concerning for pneumonia. She was admitted by internal medicine and started on doxycycline. Cardiology was consult and definitive left heart catheterization was recommended.  Left heart catheterization revealed single vessel obstructive disease with a 90% proximal left circumflex stenosis and a large dominant vessel. She underwent successful PCI utilizing a drug-eluting stent. She was also found to have moderate distal LAD disease which was 60% stenosis. Medical management was recommended. Left ventricular ejection fraction was normal by echo at 65-70%. She was placed on dual antiplatelet therapy with aspirin plus Brilinta. In addition she was also placed on high intensity statin therapy with Lipitor. Lipid panel showed an elevated LDL level of 158  Mg/dL. She was also continued on Coreg and lisinopril. Hgb A1c showed poorly controlled DM at 11.2. She is on insulin. This is followed by her PCP.  She reports to clinic today for post hospital follow-up. She reports that she has done well since discharge. She denies any recurrent chest pain. No dyspnea. She reports that she has been fully compliant with her medications. EKG today shows normal sinus rhythm. Heart rate is 94 bpm. Her blood pressure is elevated at 160/100. Again, she is asymptomatic without chest pain, dyspnea, no headache, dizziness or visual changes.    Current Meds  Medication Sig  . albuterol (PROVENTIL HFA;VENTOLIN  HFA) 108 (90 Base) MCG/ACT inhaler Inhale 2 puffs into the lungs every 6 (six) hours as needed for wheezing or shortness of breath.  [name]. aspirin 81 MG chewable tablet Chew 1 tablet (81 mg total) by mouth daily.  [name]. atorvastatin (LIPITOR) 80 MG tablet Take 1 tablet (80 mg total) by mouth daily at 6 PM.  . carvedilol (COREG) 6.25 MG tablet Take 0.5 tablets (3.125 mg total) by mouth 2 (two) times daily with a meal.  . cholecalciferol (VITAMIN D) 1000 UNITS tablet Take 3,000 Units by mouth daily.  . cloNIDine (CATAPRES) 0.2 MG tablet Take 0.2 mg by mouth at bedtime.  . gabapentin (NEURONTIN) 300 MG capsule Take 300 mg by mouth 3 (three) times daily.   . hydrochlorothiazide (HYDRODIURIL) 25 MG tablet Take 1 tablet (25 mg total) by mouth daily.  . insulin glargine (LANTUS) 100 UNIT/ML injection Inject 50 Units into the skin 2 (two) times daily.  [name]. lisinopril (PRINIVIL,ZESTRIL) 20 MG tablet Take 1 tablet (20 mg total) by mouth at bedtime. Restart tomorrow  . metFORMIN (GLUMETZA) 1000 MG (MOD) 24 hr tablet Take 1 tablet (1,000 mg total) by mouth 2 (two) times daily with a meal. Restart tomorrow  . Oxycodone HCl 10 MG TABS Take 10 mg by mouth 3 (three) times daily as needed (pain).   [name]. oxymorphone (OPANA ER) 20 MG 12 hr tablet Take 1 tablet (20 mg total) by mouth every 12 (twelve) hours.  . saxagliptin HCl (ONGLYZA) 2.5 MG TABS tablet Take 5 mg by mouth every morning.  . ticagrelor (BRILINTA) 90 MG TABS tablet Take 1 tablet (90 mg total) by mouth 2 (two) times daily.  .[name]  traZODone (DESYREL) 50 MG tablet Take 50 mg by mouth at bedtime as needed for sleep.   . Vitamin D, Ergocalciferol, (DRISDOL) 50000 units CAPS capsule Take 50,000 Units by mouth every 7 (seven) days.  . [DISCONTINUED] carvedilol (COREG) 3.125 MG tablet Take 1 tablet (3.125 mg total) by mouth 2 (two) times daily with a meal.   No Known Allergies Past Medical History:  Diagnosis Date  . Acid reflux   . Anxiety   . Asthma   . Degenerative  disc disease   . Depression   . Diabetes mellitus   . Hypertension   . Stroke [facility])    04/22/12   Family History  Problem Relation Age of Onset  . Hypertension Other    Past Surgical History:  Procedure Laterality Date  . CARDIAC CATHETERIZATION N/A 11/21/2015   Procedure: Left Heart Cath and Coronary Angiography;  Surgeon: Peter M Swaziland, MD;  Location: [facility] INVASIVE CV LAB;  Service: Cardiovascular;  Laterality: N/A;  . CARDIAC CATHETERIZATION N/A 11/21/2015   Procedure: Coronary Stent Intervention;  Surgeon: Peter M Swaziland, MD;  Location: [facility] INVASIVE CV LAB;  Service: Cardiovascular;  Laterality: N/A;  . KNEE SURGERY     Social History   Social History  . Marital status: Divorced    Spouse name: N/A  . Number of children: N/A  . Years of education: N/A   Occupational History  . Not on file.   Social History Main Topics  . Smoking status: Current Every Day Smoker    Types: Cigarettes  . Smokeless tobacco: Never Used  . Alcohol use No  . Drug use: No  . Sexual activity: No   Other Topics Concern  . Not on file   Social History Narrative  . No narrative on file     Review of Systems: General: negative for chills, fever, night sweats or weight changes.  Cardiovascular: negative for chest pain, dyspnea on exertion, edema, orthopnea, palpitations, paroxysmal nocturnal dyspnea or shortness of breath Dermatological: negative for rash Respiratory: negative for cough or wheezing Urologic: negative for hematuria Abdominal: negative for nausea, vomiting, diarrhea, bright red blood per rectum, melena, or hematemesis Neurologic: negative for visual changes, syncope, or dizziness All other systems reviewed and are otherwise negative except as noted above.   Physical Exam:  Blood pressure (!) 160/100, pulse 94, height 5\' 10"  (1.778 m), weight 238 lb 12.8 oz (108.3 kg).  General appearance: alert, cooperative and no distress Neck: no carotid bruit and no JVD Lungs: clear to  auscultation bilaterally Heart: regular rate and rhythm, S1, S2 normal, no murmur, click, rub or gallop Extremities: no LEE Pulses: 2+ and symmetric Skin: warm and dry Neurologic: Grossly normal  EKG sinus rhythm. 94 bpm.  ASSESSMENT AND PLAN:   1. CAD: s/p NSTMI 2/2 obstructive LCx disease treated with PCI + DES also with residual moderate distal LAD stenosis of 60%, to be treated medically. She denies any recurrent CP. No dyspnea.  Continue DAPT with ASA + Brilinta, high intensity statin and BB. Will Rx PRN SLNTG.   2.HTN: Elevated in clinic today at 160/100. She is asymptomatic. Her pulse rate is 94 bpm. We will increase her carvedilol to 6.25 mg twice a day. Continue Lisinopril and HCTZ.  Avoid sodium. F/u in 1 week in HTN clinic for reassessment. If BP still elevated, can further titrate BB or increase lisinopril further to 40 mg.   3. HLD: FLP showed LDL at 158 mg/dL. Continue high intensity statin therapy with Lipitor. Will  order fasting lipid panel. She is not fasting today. Can return next week for labs. Target LDL is <70 mg/dL.  4. DM:  Poorly controlled. Hgb A1c 11.2. On Insulin. Followed by PCP.   5. Tobacco Abuse: down to 3 cigarettes a day. Complete smoking cessation strongly advised.   6. H/o CVA:   PLAN  F/u with HTN Clinic in 1 week. F/u with  Dr. Allyson Sabal in 3 months   Lazara Grieser PA-C 01/03/2016 2:09 PM

## 2016-01-03 NOTE — Telephone Encounter (Signed)
Patient called and stated that she was seen in the office today by Robbie LisBrittainy Simmons and she was instructed as below: Patient Instructions   Medication Instructions:  Your physician has recommended you make the following change in your medication:  1.  INCREASE the Coreg to 6.25 taking 1 tablet twice a day   She stated that when she went to the pharmacy she was informed that the rx was sent in for 3.125 mg bid. I will send in a corrected rx.

## 2016-01-03 NOTE — Patient Instructions (Addendum)
Medication Instructions:  Your physician has recommended you make the following change in your medication:  1.  INCREASE the Coreg to 6.25 taking 1 tablet twice a day 2.  START Nitroglycerin 0.4 s/l tablet USE AS DIRECTED ON THE BOTTLE   Labwork: 1 WEEK:  FASTING LIPID PANEL (AT TIME OF APPT WITH THE PHARMACIST IN THE HYPERTENSION CLINIC  Testing/Procedures: None ordere  Follow-Up: Your physician recommends that you schedule a follow-up appointment in: 1 WEEK IN THE HYPERTENSION CLINIC WITH THE PHARM-D Your physician recommends that you schedule a follow-up appointment in: 2-3 MONTHS WITH DR. [id number]   Any Other Special Instructions Will Be Listed Below (If Applicable). AVOID SALT INTAKE    Low-Sodium Eating Plan Sodium raises blood pressure and causes water to be held in the body. Getting less sodium from food will help lower your blood pressure, reduce any swelling, and protect your heart, liver, and kidneys. We get sodium by adding salt (sodium chloride) to food. Most of our sodium comes from canned, boxed, and frozen foods. Restaurant foods, fast foods, and pizza are also very high in sodium. Even if you take medicine to lower your blood pressure or to reduce fluid in your body, getting less sodium from your food is important. WHAT IS MY PLAN? Most people should limit their sodium intake to 2,300 mg a day. Your health care provider recommends that you limit your sodium intake to __________ a day.  WHAT DO I NEED TO KNOW ABOUT THIS EATING PLAN? For the low-sodium eating plan, you will follow these general guidelines:  Choose foods with a % Daily Value for sodium of less than 5% (as listed on the food label).   Use salt-free seasonings or herbs instead of table salt or sea salt.   Check with your health care provider or pharmacist before using salt substitutes.   Eat fresh foods.  Eat more vegetables and fruits.  Limit canned vegetables. If you do use them, rinse them well  to decrease the sodium.   Limit cheese to 1 oz (28 g) per day.   Eat lower-sodium products, often labeled as "lower sodium" or "no salt added."  Avoid foods that contain monosodium glutamate (MSG). MSG is sometimes added to [place] food and some canned foods.  Check food labels (Nutrition Facts labels) on foods to learn how much sodium is in one serving.  Eat more home-cooked food and less restaurant, buffet, and fast food.  When eating at a restaurant, ask that your food be prepared with less salt, or no salt if possible.  HOW DO I READ FOOD LABELS FOR SODIUM INFORMATION? The Nutrition Facts label lists the amount of sodium in one serving of the food. If you eat more than one serving, you must multiply the listed amount of sodium by the number of servings. Food labels may also identify foods as:  Sodium free--Less than 5 mg in a serving.  Very low sodium--35 mg or less in a serving.  Low sodium--140 mg or less in a serving.  Light in sodium--50% less sodium in a serving. For example, if a food that usually has 300 mg of sodium is changed to become light in sodium, it will have 150 mg of sodium.  Reduced sodium--25% less sodium in a serving. For example, if a food that usually has 400 mg of sodium is changed to reduced sodium, it will have 300 mg of sodium. WHAT FOODS CAN I EAT? Grains Low-sodium cereals, including oats, puffed wheat and rice, and  shredded wheat cereals. Low-sodium crackers. Unsalted rice and pasta. Lower-sodium bread.  Vegetables Frozen or fresh vegetables. Low-sodium or reduced-sodium canned vegetables. Low-sodium or reduced-sodium tomato sauce and paste. Low-sodium or reduced-sodium tomato and vegetable juices.  Fruits Fresh, frozen, and canned fruit. Fruit juice.  Meat and Other Protein Products Low-sodium canned tuna and salmon. Fresh or frozen meat, poultry, seafood, and fish. Lamb. Unsalted nuts. Dried beans, peas, and lentils without added salt.  Unsalted canned beans. Homemade soups without salt. Eggs.  Dairy Milk. Soy milk. Ricotta cheese. Low-sodium or reduced-sodium cheeses. Yogurt.  Condiments Fresh and dried herbs and spices. Salt-free seasonings. Onion and garlic powders. Low-sodium varieties of mustard and ketchup. Fresh or refrigerated horseradish. Lemon juice.  Fats and Oils Reduced-sodium salad dressings. Unsalted butter.  Other Unsalted popcorn and pretzels.  The items listed above may not be a complete list of recommended foods or beverages. Contact your dietitian for more options. WHAT FOODS ARE NOT RECOMMENDED? Grains Instant hot cereals. Bread stuffing, pancake, and biscuit mixes. Croutons. Seasoned rice or pasta mixes. Noodle soup cups. Boxed or frozen macaroni and cheese. Self-rising flour. Regular salted crackers. Vegetables Regular canned vegetables. Regular canned tomato sauce and paste. Regular tomato and vegetable juices. Frozen vegetables in sauces. Salted [place] fries. Olives. Rosita FirePickles. Relishes. Sauerkraut. Salsa. Meat and Other Protein Products Salted, canned, smoked, spiced, or pickled meats, seafood, or fish. Bacon, ham, sausage, hot dogs, corned beef, chipped beef, and packaged luncheon meats. Salt pork. Jerky. Pickled herring. Anchovies, regular canned tuna, and sardines. Salted nuts. Dairy Processed cheese and cheese spreads. Cheese curds. Blue cheese and cottage cheese. Buttermilk.  Condiments Onion and garlic salt, seasoned salt, table salt, and sea salt. Canned and packaged gravies. Worcestershire sauce. Tartar sauce. Barbecue sauce. Teriyaki sauce. Soy sauce, including reduced sodium. Steak sauce. Fish sauce. Oyster sauce. Cocktail sauce. Horseradish that you find on the shelf. Regular ketchup and mustard. Meat flavorings and tenderizers. Bouillon cubes. Hot sauce. Tabasco sauce. Marinades. Taco seasonings. Relishes. Fats and Oils Regular salad dressings. Salted butter. Margarine. Ghee.  Bacon fat.  Other Potato and tortilla chips. Corn chips and puffs. Salted popcorn and pretzels. Canned or dried soups. Pizza. Frozen entrees and pot pies.  The items listed above may not be a complete list of foods and beverages to avoid. Contact your dietitian for more information.   This information is not intended to replace advice given to you by your health care provider. Make sure you discuss any questions you have with your health care provider.   Document Released: 09/20/2001 Document Revised: 04/21/2014 Document Reviewed: 02/02/2013 Elsevier Interactive Patient Education [employer]2016 Elsevier Inc.     If you need a refill on your cardiac medications before your next appointment, please call your pharmacy.

## 2016-01-08 ENCOUNTER — Ambulatory Visit: Payer: Medicaid Other | Admitting: Cardiovascular Disease

## 2016-01-10 ENCOUNTER — Other Ambulatory Visit: Payer: Self-pay | Admitting: Cardiovascular Disease

## 2016-01-10 NOTE — Progress Notes (Signed)
Patient ID: Miranda [name]                 DOB: 03/30/1956                      MRN: [id number]     HPI: Miranda [name] is a 60 y.o. female patient of Dr. Allyson Sabal with PMH below who presents today for hypertension evaluation.  She recently was admitted to hospital for NSTEMI. At her follow up visit her carvedilol was increased to 6.25mg  BID.   She has felt well on higher dose of carvedilol, but she has noticed her pressure are running higher than when she was on amlodipine.   Cardiac Hx: HTN, Stroke, TIA, NSTEMI, DM, HLD  Current HTN meds:  Carvedilol 6.25mg  BID Clonidine 0.2mg  QHS Hydrochlorothiazide 25mg  QAM Lisinopril 20mg  QPM  Previously tried:  Amlodipine - stopped when changed to beta blocker  BP goal: <140/90, optional goal <130/80 given DM history  Family History: She states her mother and sister both have a history of cardiac disease.   Social History: current smoker (3 cigarettes/ day) down from 1/2 - 1 ppd. She denies smokeless tobacco and alcohol.   Diet: She eats a fair amount of her meals out and admits to a lot of fast food. She states she has been trying to make improvements in her diet recently. She has decreased her fried food consumption. She denies adding salt to food. She drinks 1 cup of coffee per day and 1-2 glasses of green tea.   Exercise: She has tried to walk for about 10 minutes each day.   Home BP readings: She reports that her pressures have been running in the high 130/50s at home. She states this is higher than she ran when she was on amlodipine.  Wt Readings from Last 3 Encounters:  01/03/16 238 lb 12.8 oz (108.3 kg)  11/22/15 242 lb 8.1 oz (110 kg)  08/13/14 241 lb 9.6 oz (109.6 kg)   BP Readings from Last 3 Encounters:  01/11/16 134/82  01/03/16 (!) 160/100  11/22/15 (!) 144/60   Pulse Readings from Last 3 Encounters:  01/11/16 77  01/03/16 94  11/22/15 84    Renal function: CrCl cannot be calculated (Patient's most recent lab  result is older than the maximum 21 days allowed.).  Past Medical History:  Diagnosis Date  . Acid reflux   . Anxiety   . Asthma   . Degenerative disc disease   . Depression   . Diabetes mellitus   . Hypertension   . Stroke [facility])    04/22/12    Current Outpatient Prescriptions on File Prior to Visit  Medication Sig Dispense Refill  . albuterol (PROVENTIL HFA;VENTOLIN HFA) 108 (90 Base) MCG/ACT inhaler Inhale 2 puffs into the lungs every 6 (six) hours as needed for wheezing or shortness of breath.    [name] aspirin 81 MG chewable tablet Chew 1 tablet (81 mg total) by mouth daily.    [name] atorvastatin (LIPITOR) 80 MG tablet Take 1 tablet (80 mg total) by mouth daily at 6 PM. 40 tablet 0  . cholecalciferol (VITAMIN D) 1000 UNITS tablet Take 3,000 Units by mouth daily.    . cloNIDine (CATAPRES) 0.2 MG tablet Take 0.2 mg by mouth at bedtime.    . gabapentin (NEURONTIN) 300 MG capsule Take 300 mg by mouth 3 (three) times daily.     . hydrochlorothiazide (HYDRODIURIL) 25 MG tablet Take 1 tablet (25 mg total) by  mouth daily. 40 tablet 0  . insulin glargine (LANTUS) 100 UNIT/ML injection Inject 50 Units into the skin 2 (two) times daily.    [name]. lisinopril (PRINIVIL,ZESTRIL) 20 MG tablet Take 1 tablet (20 mg total) by mouth at bedtime. Restart tomorrow 40 tablet 0  . metFORMIN (GLUMETZA) 1000 MG (MOD) 24 hr tablet Take 1 tablet (1,000 mg total) by mouth 2 (two) times daily with a meal. Restart tomorrow    . Oxycodone HCl 10 MG TABS Take 10 mg by mouth 3 (three) times daily as needed (pain).     [name]. oxymorphone (OPANA ER) 20 MG 12 hr tablet Take 1 tablet (20 mg total) by mouth every 12 (twelve) hours. 10 tablet 0  . saxagliptin HCl (ONGLYZA) 2.5 MG TABS tablet Take 5 mg by mouth every morning.    . traZODone (DESYREL) 50 MG tablet Take 50 mg by mouth at bedtime as needed for sleep.     . Vitamin D, Ergocalciferol, (DRISDOL) 50000 units CAPS capsule Take 50,000 Units by mouth every 7 (seven) days.    .  nitroGLYCERIN (NITROSTAT) 0.4 MG SL tablet Place 1 tablet (0.4 mg total) under the tongue every 5 (five) minutes as needed for chest pain. (Patient not taking: Reported on 01/11/2016) 25 tablet 3   No current facility-administered medications on file prior to visit.     No Known Allergies  Blood pressure 134/82, pulse 77.   Assessment/Plan: Hypertension: BP today is at less strict goal; however, her pressure are running higher than usual. Will increase her carvedilol to 12.5mg  BID. If pressure is still not at more strict goal at follow up would consider increasing lisinopril. She will have BMET today.    Thank you, Freddie ApleyKelley M. Cleatis PolkaAuten, PharmD  [facility]  01/11/2016 10:05 AM

## 2016-01-11 ENCOUNTER — Other Ambulatory Visit: Payer: Medicaid Other | Admitting: *Deleted

## 2016-01-11 ENCOUNTER — Ambulatory Visit (INDEPENDENT_AMBULATORY_CARE_PROVIDER_SITE_OTHER): Payer: Medicaid Other | Admitting: Pharmacist

## 2016-01-11 ENCOUNTER — Encounter: Payer: Self-pay | Admitting: Pharmacist

## 2016-01-11 VITALS — BP 134/82 | HR 77 | Wt 239.5 lb

## 2016-01-11 DIAGNOSIS — I10 Essential (primary) hypertension: Secondary | ICD-10-CM | POA: Diagnosis not present

## 2016-01-11 DIAGNOSIS — E785 Hyperlipidemia, unspecified: Secondary | ICD-10-CM

## 2016-01-11 LAB — LIPID PANEL
Cholesterol: 136 mg/dL (ref 125–200)
HDL: 41 mg/dL — ABNORMAL LOW (ref >=46)
LDL CALC: 81 mg/dL (ref <130)
Total CHOL/HDL Ratio: 3.3 Ratio (ref <=5.0)
Triglycerides: 69 mg/dL (ref <150)
VLDL: 14 mg/dL (ref <30)

## 2016-01-11 MED ORDER — TICAGRELOR 90 MG PO TABS: 90.0000 mg | ORAL_TABLET | Freq: Two times a day (BID) | ORAL | 1 refills | Status: DC

## 2016-01-11 MED ORDER — CARVEDILOL 12.5 MG PO TABS: 12.5000 mg | ORAL_TABLET | Freq: Two times a day (BID) | ORAL | 2 refills | Status: DC

## 2016-01-11 NOTE — Patient Instructions (Signed)
Return for a follow up appointment in 4 weeks    Check your blood pressure at home daily (if able) and keep record of the readings.  Take your BP meds as follows: INCREASE carvedilol to 12.5mg  twice daily Continue all other medications as prescribed  Bring all of your meds, your BP cuff and your record of home blood pressures to your next appointment.  Exercise as you're able, try to walk approximately 30 minutes per day.  Keep salt intake to a minimum, especially watch canned and prepared boxed foods.  Eat more fresh fruits and vegetables and fewer canned items.  Avoid eating in fast food restaurants.    HOW TO TAKE YOUR BLOOD PRESSURE: . Rest 5 minutes before taking your blood pressure. .  Don't smoke or drink caffeinated beverages for at least 30 minutes before. . Take your blood pressure before (not after) you eat. . Sit comfortably with your back supported and both feet on the floor (don't cross your legs). . Elevate your arm to heart level on a table or a desk. . Use the proper sized cuff. It should fit smoothly and snugly around your bare upper arm. There should be enough room to slip a fingertip under the cuff. The bottom edge of the cuff should be 1 inch above the crease of the elbow. . Ideally, take 3 measurements at one sitting and record the average.

## 2016-01-14 ENCOUNTER — Telehealth: Payer: Self-pay | Admitting: Cardiovascular Disease

## 2016-01-14 NOTE — Telephone Encounter (Signed)
Left message to call back  

## 2016-01-14 NOTE — Telephone Encounter (Signed)
Please call,concerning her medicine,she needs to get it straight.

## 2016-01-16 MED ORDER — CARVEDILOL 6.25 MG PO TABS: 6.2500 mg | ORAL_TABLET | Freq: Two times a day (BID) | ORAL | 6 refills | Status: DC

## 2016-01-16 NOTE — Telephone Encounter (Signed)
Spoke with pt, she was taking 3.125 mg twice daily when seen on 01-11-16. At that appointment the carvedilol was doubled. She needs a script for the correct dose. Script for carvedilol 6.25 mg twice daily sent to the pharmacy.

## 2016-02-01 ENCOUNTER — Other Ambulatory Visit: Payer: Self-pay | Admitting: Cardiovascular Disease

## 2016-02-01 NOTE — Telephone Encounter (Signed)
Rx(s) sent to pharmacy electronically.  

## 2016-02-08 ENCOUNTER — Ambulatory Visit: Payer: Medicaid Other

## 2016-02-12 ENCOUNTER — Other Ambulatory Visit: Payer: Self-pay | Admitting: Cardiovascular Disease

## 2016-02-12 ENCOUNTER — Ambulatory Visit (INDEPENDENT_AMBULATORY_CARE_PROVIDER_SITE_OTHER): Payer: Medicaid Other | Admitting: Pharmacist Clinician (PhC)/ Clinical Pharmacy Specialist

## 2016-02-12 ENCOUNTER — Encounter: Payer: Self-pay | Admitting: Pharmacist Clinician (PhC)/ Clinical Pharmacy Specialist

## 2016-02-12 DIAGNOSIS — I10 Essential (primary) hypertension: Secondary | ICD-10-CM | POA: Diagnosis not present

## 2016-02-12 NOTE — Progress Notes (Signed)
Patient ID: Miranda [name]                 DOB: Mar 29, 1956                      MRN: [id number]     HPI: Miranda [name] is a 60 y.o. female patient of Dr. Allyson SabalBerry with PMH below who presents today for hypertension evaluation.  She previously suffered a NSTEMI this year and is doing well.  Her carvedilol was increased at her last appointment with Prudence DavidsonKelley Auten PharmD at the end of September from 6.25 to 12.5 mg twice daily.  However she did not increase her dose and still takes the 6.25 mg twice daily.  She again complains that her pressure was better controlled on amlodipine, but that was d/c'd after her MI.  She has no complaints of fatigue, dizziness, SOB, CP or LEE  Cardiac Hx: HTN, Stroke, TIA, NSTEMI, DM, HLD  Current HTN meds:  Carvedilol 6.25mg  BID Clonidine 0.2mg  QHS Hydrochlorothiazide 25mg  QAM Lisinopril 20mg  QPM  Previously tried:  Amlodipine - stopped post hosp when started carvedilol  BP goal: <140/90, optional goal <130/80 given DM history  Family History:  Mother died in her 1150's from MI, post CABG x3, sister still living post MI with stenting  Social History: current smoker (4 cigarettes/ day) down from 1/2 - 1 ppd. She denies smokeless tobacco and alcohol.   Diet: She eats a fair amount of her meals out and admits to a lot of fast food.  She is trying to eat more at home and looking for heart healthy menu options when she eats out.  She has decreased her fried food consumption. She denies adding salt to food. She drinks 1 cup of coffee per day and 1-2 glasses of green tea.   Exercise: She has tried to walk for about 10 minutes each day.  Doesn't do much because of arthritis is back and knees   Home BP readings: She reports that her pressures have been running in the high 120-130's at home. Diastolic readings are mostly in the 90's.  Did not bring her cuff or list of home readings with her.    Wt Readings from Last 3 Encounters:  02/12/16 238 lb 8 oz (108.2 kg)    01/11/16 239 lb 8 oz (108.6 kg)  01/03/16 238 lb 12.8 oz (108.3 kg)   BP Readings from Last 3 Encounters:  02/12/16 (!) 134/92  01/11/16 134/82  01/03/16 (!) 160/100   Pulse Readings from Last 3 Encounters:  02/12/16 72  01/11/16 77  01/03/16 94    Renal function: CrCl cannot be calculated (Patient's most recent lab result is older than the maximum 21 days allowed.).  Past Medical History:  Diagnosis Date  . Acid reflux   . Anxiety   . Asthma   . Degenerative disc disease   . Depression   . Diabetes mellitus   . Hypertension   . Stroke [facility](HCC)    04/22/12    Current Outpatient Prescriptions on File Prior to Visit  Medication Sig Dispense Refill  . albuterol (PROVENTIL HFA;VENTOLIN HFA) 108 (90 Base) MCG/ACT inhaler Inhale 2 puffs into the lungs every 6 (six) hours as needed for wheezing or shortness of breath.    [name]. aspirin 81 MG chewable tablet Chew 1 tablet (81 mg total) by mouth daily.    [name]. atorvastatin (LIPITOR) 80 MG tablet TAKE 1 TABLET EVERY DAY AT 6 PM 30 tablet 6  .  carvedilol (COREG) 6.25 MG tablet Take 1 tablet (6.25 mg total) by mouth 2 (two) times daily with a meal. 60 tablet 6  . cholecalciferol (VITAMIN D) 1000 UNITS tablet Take 3,000 Units by mouth daily.    . cloNIDine (CATAPRES) 0.2 MG tablet Take 0.2 mg by mouth at bedtime.    . Cyanocobalamin (B-12) 1000 MCG CAPS Take 1 tablet by mouth daily.    [name]. gabapentin (NEURONTIN) 300 MG capsule Take 300 mg by mouth 3 (three) times daily.     . hydrochlorothiazide (HYDRODIURIL) 25 MG tablet Take 1 tablet (25 mg total) by mouth daily. 40 tablet 0  . insulin glargine (LANTUS) 100 UNIT/ML injection Inject 50 Units into the skin 2 (two) times daily.    [name]. lisinopril (PRINIVIL,ZESTRIL) 20 MG tablet Take 1 tablet (20 mg total) by mouth at bedtime. Restart tomorrow 40 tablet 0  . metFORMIN (GLUMETZA) 1000 MG (MOD) 24 hr tablet Take 1 tablet (1,000 mg total) by mouth 2 (two) times daily with a meal. Restart tomorrow    .  nitroGLYCERIN (NITROSTAT) 0.4 MG SL tablet Place 1 tablet (0.4 mg total) under the tongue every 5 (five) minutes as needed for chest pain. (Patient not taking: Reported on 01/11/2016) 25 tablet 3  . Omega-3 Fatty Acids (FISH OIL) 1000 MG CAPS Take 1 capsule by mouth daily.    . Oxycodone HCl 10 MG TABS Take 10 mg by mouth 3 (three) times daily as needed (pain).     [name]. oxymorphone (OPANA ER) 20 MG 12 hr tablet Take 1 tablet (20 mg total) by mouth every 12 (twelve) hours. 10 tablet 0  . saxagliptin HCl (ONGLYZA) 2.5 MG TABS tablet Take 5 mg by mouth every morning.    . ticagrelor (BRILINTA) 90 MG TABS tablet Take 1 tablet (90 mg total) by mouth 2 (two) times daily. 60 tablet 1  . traZODone (DESYREL) 50 MG tablet Take 50 mg by mouth at bedtime as needed for sleep.     . Vitamin D, Ergocalciferol, (DRISDOL) 50000 units CAPS capsule Take 50,000 Units by mouth every 7 (seven) days.     No current facility-administered medications on file prior to visit.     No Known Allergies  Blood pressure (!) 134/92, pulse 72, weight 238 lb 8 oz (108.2 kg).   Assessment/Plan:  Diastolic BP is still elevated in the 90's.  She did not increase the carvedilol as directed after her last appointment.  Will have her increase that dose today, to 12.5 mg twice daily.  Explained that although the amlodipine did control her BP, we need her to be on a beta blocker and ACEI post MI.  If her pressure is still elevated despite higher doses of each of these, we can look to re-start the amlodipine.  She is to check home BP readings daily and record them.  She has an appointment with Dr. Allyson SabalBerry on Nov 28 at which time we can further adjust her medications.      Thank you, Phillips HayKristin Daxtyn Rottenberg, PharmD CPP [facility]  [address] [place], [place] [address] [phone] 02/12/2016 11:41 AM

## 2016-02-12 NOTE — Assessment & Plan Note (Signed)
Diastolic BP is still elevated in the 90's.  She did not increase the carvedilol as directed after her last appointment.  Will have her increase that dose today, to 12.5 mg twice daily.  Explained that although the amlodipine did control her BP, we need her to be on a beta blocker and ACEI post MI.  If her pressure is still elevated despite higher doses of each of these, we can look to re-start the amlodipine.  She is to check home BP readings daily and record them.  She has an appointment with Dr. Allyson SabalBerry on Nov 28 at which time we can further adjust her medications.

## 2016-02-12 NOTE — Patient Instructions (Addendum)
Return for a a follow up appointment in 4 weeks  Your blood pressure today is 134/92  (goal is <140/90, optimal <130/80)  Check your blood pressure at home daily and keep record of the readings.  Take your BP meds as follows:  Increase carvedilol to 12.5 mg twice daily,   Continue all other medication  Bring all of your meds, your BP cuff and your record of home blood pressures to your next appointment.  Exercise as you're able, try to walk approximately 30 minutes per day.  Keep salt intake to a minimum, especially watch canned and prepared boxed foods.  Eat more fresh fruits and vegetables and fewer canned items.  Avoid eating in fast food restaurants.    HOW TO TAKE YOUR BLOOD PRESSURE: . Rest 5 minutes before taking your blood pressure. .  Don't smoke or drink caffeinated beverages for at least 30 minutes before. . Take your blood pressure before (not after) you eat. . Sit comfortably with your back supported and both feet on the floor (don't cross your legs). . Elevate your arm to heart level on a table or a desk. . Use the proper sized cuff. It should fit smoothly and snugly around your bare upper arm. There should be enough room to slip a fingertip under the cuff. The bottom edge of the cuff should be 1 inch above the crease of the elbow. . Ideally, take 3 measurements at one sitting and record the average.

## 2016-02-13 NOTE — Telephone Encounter (Signed)
Rx(s) sent to pharmacy electronically.  

## 2016-03-11 ENCOUNTER — Ambulatory Visit: Payer: Medicaid Other | Admitting: Cardiovascular Disease

## 2016-03-17 ENCOUNTER — Other Ambulatory Visit: Payer: Self-pay | Admitting: Internal Medicine

## 2016-03-17 DIAGNOSIS — Z1231 Encounter for screening mammogram for malignant neoplasm of breast: Secondary | ICD-10-CM

## 2016-03-19 ENCOUNTER — Ambulatory Visit: Payer: Medicaid Other | Admitting: Cardiovascular Disease

## 2016-04-08 ENCOUNTER — Other Ambulatory Visit: Payer: Self-pay | Admitting: Cardiovascular Disease

## 2016-04-08 NOTE — Telephone Encounter (Signed)
REFILL 

## 2016-04-11 ENCOUNTER — Ambulatory Visit: Payer: Medicaid Other

## 2016-04-18 ENCOUNTER — Telehealth (HOSPITAL_COMMUNITY): Payer: Self-pay | Admitting: *Deleted

## 2016-04-18 NOTE — Telephone Encounter (Signed)
-----   Message from Evans Lanceaylor W Stover sent at 04/18/2016  8:29 AM EST ----- Regarding: FW: Cardiac rehab reimbursement for medicaid form [place]  Yes Dr. Allyson SabalBerry has reviewed the form and signed it. But the pt does not meet the criteria in the High Risk Category for Medicaid reimbursement and a GXT is not indicated.   I am faxing the form back to you today.  Will she still be able to participate if she wants to?  Thanks, Ladona Ridgelaylor, CMA ----- Message ----- From: Chelsea Ausarlette B Carlton, RN Sent: 04/17/2016   7:46 AM To: Evans Lanceaylor W Stover Subject: Cardiac rehab reimbursement for medicaid form  Ladona Ridgelaylor  I understand you are working with Dr. Allyson SabalBerry. I sent over last week a medicaid order for the above pt to do cardiac rehab. Dr. Allyson SabalBerry did not sign off on the high risk factor and whether a gxt was needed. Do you know if he has had a chance to review this?  Tentatively have pt coming on next Tuesday for orientation to cardiac rehab.   Thanks for the help  PepsiCoCarlette Carlton RN

## 2016-04-22 ENCOUNTER — Ambulatory Visit (HOSPITAL_COMMUNITY): Payer: Medicaid Other

## 2016-05-07 ENCOUNTER — Ambulatory Visit (HOSPITAL_COMMUNITY): Payer: Medicaid Other

## 2016-05-09 ENCOUNTER — Ambulatory Visit (HOSPITAL_COMMUNITY): Payer: Medicaid Other

## 2016-05-12 ENCOUNTER — Ambulatory Visit (HOSPITAL_COMMUNITY): Payer: Medicaid Other

## 2016-05-14 ENCOUNTER — Ambulatory Visit (HOSPITAL_COMMUNITY): Payer: Medicaid Other

## 2016-05-14 ENCOUNTER — Other Ambulatory Visit: Payer: Self-pay | Admitting: Cardiovascular Disease

## 2016-05-14 NOTE — Telephone Encounter (Signed)
Rx request sent to pharmacy.  

## 2016-05-16 ENCOUNTER — Ambulatory Visit (HOSPITAL_COMMUNITY): Payer: Medicaid Other

## 2016-05-19 ENCOUNTER — Ambulatory Visit (HOSPITAL_COMMUNITY): Payer: Medicaid Other

## 2016-05-21 ENCOUNTER — Ambulatory Visit (HOSPITAL_COMMUNITY): Payer: Medicaid Other

## 2016-05-23 ENCOUNTER — Ambulatory Visit: Payer: Medicaid Other | Admitting: Cardiovascular Disease

## 2016-05-23 ENCOUNTER — Ambulatory Visit (HOSPITAL_COMMUNITY): Payer: Medicaid Other

## 2016-05-26 ENCOUNTER — Ambulatory Visit (HOSPITAL_COMMUNITY): Payer: Medicaid Other

## 2016-05-28 ENCOUNTER — Ambulatory Visit (HOSPITAL_COMMUNITY): Payer: Medicaid Other

## 2016-05-30 ENCOUNTER — Ambulatory Visit (HOSPITAL_COMMUNITY): Payer: Medicaid Other

## 2016-06-02 ENCOUNTER — Ambulatory Visit (HOSPITAL_COMMUNITY): Payer: Medicaid Other

## 2016-06-04 ENCOUNTER — Ambulatory Visit (HOSPITAL_COMMUNITY): Payer: Medicaid Other

## 2016-06-06 ENCOUNTER — Ambulatory Visit (HOSPITAL_COMMUNITY): Payer: Medicaid Other

## 2016-06-08 ENCOUNTER — Other Ambulatory Visit: Payer: Self-pay | Admitting: Cardiovascular Disease

## 2016-06-09 ENCOUNTER — Ambulatory Visit (HOSPITAL_COMMUNITY): Payer: Medicaid Other

## 2016-06-10 ENCOUNTER — Encounter: Payer: Self-pay | Admitting: Cardiovascular Disease

## 2016-06-10 ENCOUNTER — Ambulatory Visit (INDEPENDENT_AMBULATORY_CARE_PROVIDER_SITE_OTHER): Payer: Medicaid Other | Admitting: Cardiovascular Disease

## 2016-06-10 VITALS — BP 137/74 | HR 68 | Ht 70.0 in | Wt 236.6 lb

## 2016-06-10 DIAGNOSIS — E7800 Pure hypercholesterolemia, unspecified: Secondary | ICD-10-CM | POA: Diagnosis not present

## 2016-06-10 DIAGNOSIS — I214 Non-ST elevation (NSTEMI) myocardial infarction: Secondary | ICD-10-CM | POA: Diagnosis not present

## 2016-06-10 DIAGNOSIS — I10 Essential (primary) hypertension: Secondary | ICD-10-CM | POA: Diagnosis not present

## 2016-06-10 NOTE — Progress Notes (Signed)
06/10/2016 Miranda ButtersChandra D Kleiber   04/09/1956  [id number]  Primary Physician Dorrene GermanEdwin A Avbuere, MD Primary Cardiologist: Runell GessJonathan J Dyesha Henault MD Nicholes CalamityFACP, FACC, FAHA, [place]  HPI:  Miranda [name] is a 61 year old moderately overweight divorced African-American female mother of 3, grandmother of one grandchild who I last saw in consultation during her hospitalization in August 11/20/15. She has a history of treated hypertension, diabetes, hyperlipidemia and tobacco abuse of approximately 20-30 pack years currently smoking 3-5 cigarettes a day. She is disabled because of her back and knees as well as diabetes. She's had a stroke in the past. She was admitted with chest pain and had a minimally elevated troponin. She underwent cardiac catheterization on 11/21/15 by Dr. SwazilandJordan revealing a 90% proximal AV groove circumflex 60% distal LAD lesion. Her circumflex was stented with a drug-eluting stent. She's had no recurrent symptoms.   Current Outpatient Prescriptions  Medication Sig Dispense Refill  . albuterol (PROVENTIL HFA;VENTOLIN HFA) 108 (90 Base) MCG/ACT inhaler Inhale 2 puffs into the lungs every 6 (six) hours as needed for wheezing or shortness of breath.    [name]. aspirin 81 MG chewable tablet Chew 1 tablet (81 mg total) by mouth daily.    [name]. atorvastatin (LIPITOR) 80 MG tablet TAKE 1 TABLET EVERY DAY AT 6 PM 30 tablet 6  . BRILINTA 90 MG TABS tablet TAKE 1 TABLET BY MOUTH TWICE A DAY 60 tablet 0  . buPROPion (WELLBUTRIN) 75 MG tablet Take 75 mg by mouth daily.    . carvedilol (COREG) 12.5 MG tablet Take 1 tablet (12.5 mg total) by mouth 2 (two) times daily with a meal. Please schedule appointment for refills. 60 tablet 0  . cholecalciferol (VITAMIN D) 1000 UNITS tablet Take 3,000 Units by mouth daily.    . cloNIDine (CATAPRES) 0.2 MG tablet Take 0.2 mg by mouth at bedtime.    . Cyanocobalamin (B-12) 1000 MCG CAPS Take 1 tablet by mouth daily.    [name]. gabapentin (NEURONTIN) 300 MG capsule Take 300 mg by mouth 3 (three)  times daily.     . insulin glargine (LANTUS) 100 UNIT/ML injection Inject 50 Units into the skin 2 (two) times daily.    [name]. lisinopril (PRINIVIL,ZESTRIL) 20 MG tablet Take 1 tablet (20 mg total) by mouth at bedtime. Restart tomorrow 40 tablet 0  . metFORMIN (GLUMETZA) 1000 MG (MOD) 24 hr tablet Take 1 tablet (1,000 mg total) by mouth 2 (two) times daily with a meal. Restart tomorrow    . Omega-3 Fatty Acids (FISH OIL) 1000 MG CAPS Take 1 capsule by mouth daily.    . OxyCODONE ER (XTAMPZA ER) 18 MG C12A Take by mouth every 12 (twelve) hours.    . Oxycodone HCl 10 MG TABS Take 10 mg by mouth 3 (three) times daily as needed (pain).     [name]. oxymorphone (OPANA ER) 20 MG 12 hr tablet Take 1 tablet (20 mg total) by mouth every 12 (twelve) hours. 10 tablet 0  . saxagliptin HCl (ONGLYZA) 2.5 MG TABS tablet Take 5 mg by mouth every morning.    . traZODone (DESYREL) 50 MG tablet Take 50 mg by mouth at bedtime as needed for sleep.     . Vitamin D, Ergocalciferol, (DRISDOL) 50000 units CAPS capsule Take 50,000 Units by mouth every 7 (seven) days.    . hydrochlorothiazide (HYDRODIURIL) 25 MG tablet Take 1 tablet (25 mg total) by mouth daily. 30 tablet 6  . nitroGLYCERIN (NITROSTAT) 0.4 MG SL tablet Place 1 tablet (  0.4 mg total) under the tongue every 5 (five) minutes as needed for chest pain. (Patient not taking: Reported on 01/11/2016) 25 tablet 3   No current facility-administered medications for this visit.     No Known Allergies  Social History   Social History  . Marital status: Divorced    Spouse name: N/A  . Number of children: N/A  . Years of education: N/A   Occupational History  . Not on file.   Social History Main Topics  . Smoking status: Current Every Day Smoker    Packs/day: 0.25    Types: Cigarettes  . Smokeless tobacco: Never Used  . Alcohol use No  . Drug use: No  . Sexual activity: No   Other Topics Concern  . Not on file   Social History Narrative  . No narrative on file       Review of Systems: General: negative for chills, fever, night sweats or weight changes.  Cardiovascular: negative for chest pain, dyspnea on exertion, edema, orthopnea, palpitations, paroxysmal nocturnal dyspnea or shortness of breath Dermatological: negative for rash Respiratory: negative for cough or wheezing Urologic: negative for hematuria Abdominal: negative for nausea, vomiting, diarrhea, bright red blood per rectum, melena, or hematemesis Neurologic: negative for visual changes, syncope, or dizziness All other systems reviewed and are otherwise negative except as noted above.    Blood pressure 137/74, pulse 68, height 5\' 10"  (1.778 m), weight 236 lb 9.6 oz (107.3 kg).  General appearance: alert and no distress Neck: no adenopathy, no carotid bruit, no JVD, supple, symmetrical, trachea midline and thyroid not enlarged, symmetric, no tenderness/mass/nodules Lungs: clear to auscultation bilaterally Heart: regular rate and rhythm, S1, S2 normal, no murmur, click, rub or gallop Extremities: extremities normal, atraumatic, no cyanosis or edema  EKG sinus rhythm at 68 with inferolateral T-wave inversion more pronounced compared to prior EKGs.. I personally reviewed this EKG.  ASSESSMENT AND PLAN:   Essential hypertension History of hypertension blood pressure measured 137/74. She is on carvedilol, lisinopril and hydrochlorothiazide. Continue current meds at current dosing  Hyperlipidemia History of hyperlipidemia on high-dose statin therapy with recent lipid profile performed 01/11/16 revealed total cholesterol 136, LDL 81 and HDL of 41.  NSTEMI (non-ST elevated myocardial infarction) (HCC) History of non-STEMI status post drug-eluting stent to a high-grade proximal AV groove circumflex by Dr. Swaziland 11/21/15. There was a 60% distal LAD lesion which was treated medically. She is on dual antibiotic therapy. She denies chest pain.      [name] MD FACP,FACC,FAHA,  [facility] 06/10/2016 9:58 AM

## 2016-06-10 NOTE — Assessment & Plan Note (Signed)
History of hyperlipidemia on high-dose statin therapy with recent lipid profile performed 01/11/16 revealed total cholesterol 136, LDL 81 and HDL of 41.

## 2016-06-10 NOTE — Assessment & Plan Note (Signed)
History of hypertension blood pressure measured 137/74. She is on carvedilol, lisinopril and hydrochlorothiazide. Continue current meds at current dosing

## 2016-06-10 NOTE — Patient Instructions (Signed)
Medication Instructions: Your physician recommends that you continue on your current medications as directed. Please refer to the Current Medication list given to you today.   Follow-Up: We request that you follow-up in: 6 months with Boyce MediciBrittany Simmons, PA-C and in 12 months with Dr San MorelleBerry  You will receive a reminder letter in the mail two months in advance. If you don't receive a letter, please call our office to schedule the follow-up appointment.  If you need a refill on your cardiac medications before your next appointment, please call your pharmacy.

## 2016-06-10 NOTE — Assessment & Plan Note (Signed)
History of non-STEMI status post drug-eluting stent to a high-grade proximal AV groove circumflex by Dr. SwazilandJordan 11/21/15. There was a 60% distal LAD lesion which was treated medically. She is on dual antibiotic therapy. She denies chest pain.

## 2016-06-11 ENCOUNTER — Ambulatory Visit (HOSPITAL_COMMUNITY): Payer: Medicaid Other

## 2016-06-13 ENCOUNTER — Ambulatory Visit (HOSPITAL_COMMUNITY): Payer: Medicaid Other

## 2016-06-16 ENCOUNTER — Ambulatory Visit (HOSPITAL_COMMUNITY): Payer: Medicaid Other

## 2016-06-18 ENCOUNTER — Ambulatory Visit (HOSPITAL_COMMUNITY): Payer: Medicaid Other

## 2016-06-20 ENCOUNTER — Ambulatory Visit (HOSPITAL_COMMUNITY): Payer: Medicaid Other

## 2016-06-23 ENCOUNTER — Ambulatory Visit (HOSPITAL_COMMUNITY): Payer: Medicaid Other

## 2016-06-25 ENCOUNTER — Ambulatory Visit (HOSPITAL_COMMUNITY): Payer: Medicaid Other

## 2016-06-27 ENCOUNTER — Ambulatory Visit (HOSPITAL_COMMUNITY): Payer: Medicaid Other

## 2016-06-30 ENCOUNTER — Ambulatory Visit (HOSPITAL_COMMUNITY): Payer: Medicaid Other

## 2016-07-02 ENCOUNTER — Ambulatory Visit (HOSPITAL_COMMUNITY): Payer: Medicaid Other

## 2016-07-04 ENCOUNTER — Ambulatory Visit (HOSPITAL_COMMUNITY): Payer: Medicaid Other

## 2016-07-06 ENCOUNTER — Other Ambulatory Visit: Payer: Self-pay | Admitting: Cardiovascular Disease

## 2016-07-07 ENCOUNTER — Ambulatory Visit (HOSPITAL_COMMUNITY): Payer: Medicaid Other

## 2016-07-09 ENCOUNTER — Other Ambulatory Visit: Payer: Self-pay | Admitting: Cardiovascular Disease

## 2016-07-09 ENCOUNTER — Ambulatory Visit (HOSPITAL_COMMUNITY): Payer: Medicaid Other

## 2016-07-09 NOTE — Telephone Encounter (Signed)
Rx(s) sent to pharmacy electronically.  

## 2016-07-11 ENCOUNTER — Ambulatory Visit (HOSPITAL_COMMUNITY): Payer: Medicaid Other

## 2016-07-14 ENCOUNTER — Ambulatory Visit (HOSPITAL_COMMUNITY): Payer: Medicaid Other

## 2016-07-16 ENCOUNTER — Ambulatory Visit (HOSPITAL_COMMUNITY): Payer: Medicaid Other

## 2016-07-18 ENCOUNTER — Ambulatory Visit (HOSPITAL_COMMUNITY): Payer: Medicaid Other

## 2016-07-21 ENCOUNTER — Ambulatory Visit (HOSPITAL_COMMUNITY): Payer: Medicaid Other

## 2016-07-23 ENCOUNTER — Ambulatory Visit (HOSPITAL_COMMUNITY): Payer: Medicaid Other

## 2016-07-25 ENCOUNTER — Ambulatory Visit (HOSPITAL_COMMUNITY): Payer: Medicaid Other

## 2016-07-28 ENCOUNTER — Ambulatory Visit (HOSPITAL_COMMUNITY): Payer: Medicaid Other

## 2016-07-30 ENCOUNTER — Ambulatory Visit (HOSPITAL_COMMUNITY): Payer: Medicaid Other

## 2016-08-01 ENCOUNTER — Ambulatory Visit (HOSPITAL_COMMUNITY): Payer: Medicaid Other

## 2016-08-04 ENCOUNTER — Ambulatory Visit (HOSPITAL_COMMUNITY): Payer: Medicaid Other

## 2016-08-06 ENCOUNTER — Ambulatory Visit (HOSPITAL_COMMUNITY): Payer: Medicaid Other

## 2016-08-08 ENCOUNTER — Ambulatory Visit (HOSPITAL_COMMUNITY): Payer: Medicaid Other

## 2016-08-11 ENCOUNTER — Ambulatory Visit (HOSPITAL_COMMUNITY): Payer: Medicaid Other

## 2016-08-13 ENCOUNTER — Ambulatory Visit (HOSPITAL_COMMUNITY): Payer: Medicaid Other

## 2016-08-15 ENCOUNTER — Ambulatory Visit (HOSPITAL_COMMUNITY): Payer: Medicaid Other

## 2016-08-18 ENCOUNTER — Ambulatory Visit (HOSPITAL_COMMUNITY): Payer: Medicaid Other

## 2016-08-20 ENCOUNTER — Ambulatory Visit (HOSPITAL_COMMUNITY): Payer: Medicaid Other

## 2016-08-22 ENCOUNTER — Ambulatory Visit (HOSPITAL_COMMUNITY): Payer: Medicaid Other

## 2016-08-25 ENCOUNTER — Ambulatory Visit (HOSPITAL_COMMUNITY): Payer: Medicaid Other

## 2016-08-27 ENCOUNTER — Ambulatory Visit (HOSPITAL_COMMUNITY): Payer: Medicaid Other

## 2016-08-29 ENCOUNTER — Ambulatory Visit (HOSPITAL_COMMUNITY): Payer: Medicaid Other

## 2016-09-01 ENCOUNTER — Ambulatory Visit (HOSPITAL_COMMUNITY): Payer: Medicaid Other

## 2016-09-03 ENCOUNTER — Ambulatory Visit (HOSPITAL_COMMUNITY): Payer: Medicaid Other

## 2016-12-06 ENCOUNTER — Other Ambulatory Visit: Payer: Self-pay | Admitting: Cardiovascular Disease

## 2016-12-08 NOTE — Telephone Encounter (Signed)
Rx(s) sent to pharmacy electronically.  

## 2016-12-29 ENCOUNTER — Observation Stay (HOSPITAL_COMMUNITY)
Admission: EM | Admit: 2016-12-29 | Discharge: 2016-12-31 | Disposition: A | Discharge status: Home / Self Care | Payer: Medicaid Other | Attending: Internal Medicine | Admitting: Internal Medicine

## 2016-12-29 ENCOUNTER — Encounter (HOSPITAL_COMMUNITY): Payer: Self-pay | Admitting: Emergency Medicine

## 2016-12-29 DIAGNOSIS — Z7982 Long term (current) use of aspirin: Secondary | ICD-10-CM | POA: Insufficient documentation

## 2016-12-29 DIAGNOSIS — Z955 Presence of coronary angioplasty implant and graft: Secondary | ICD-10-CM | POA: Diagnosis not present

## 2016-12-29 DIAGNOSIS — Z8673 Personal history of transient ischemic attack (TIA), and cerebral infarction without residual deficits: Secondary | ICD-10-CM | POA: Diagnosis not present

## 2016-12-29 DIAGNOSIS — F329 Major depressive disorder, single episode, unspecified: Secondary | ICD-10-CM | POA: Insufficient documentation

## 2016-12-29 DIAGNOSIS — E1140 Type 2 diabetes mellitus with diabetic neuropathy, unspecified: Secondary | ICD-10-CM

## 2016-12-29 DIAGNOSIS — E11649 Type 2 diabetes mellitus with hypoglycemia without coma: Secondary | ICD-10-CM | POA: Diagnosis not present

## 2016-12-29 DIAGNOSIS — I2510 Atherosclerotic heart disease of native coronary artery without angina pectoris: Secondary | ICD-10-CM | POA: Diagnosis not present

## 2016-12-29 DIAGNOSIS — Z794 Long term (current) use of insulin: Secondary | ICD-10-CM | POA: Diagnosis not present

## 2016-12-29 DIAGNOSIS — F17210 Nicotine dependence, cigarettes, uncomplicated: Secondary | ICD-10-CM | POA: Insufficient documentation

## 2016-12-29 DIAGNOSIS — E876 Hypokalemia: Secondary | ICD-10-CM | POA: Diagnosis present

## 2016-12-29 DIAGNOSIS — T17908A Unspecified foreign body in respiratory tract, part unspecified causing other injury, initial encounter: Secondary | ICD-10-CM

## 2016-12-29 DIAGNOSIS — E119 Type 2 diabetes mellitus without complications: Secondary | ICD-10-CM

## 2016-12-29 DIAGNOSIS — Z79899 Other long term (current) drug therapy: Secondary | ICD-10-CM | POA: Insufficient documentation

## 2016-12-29 DIAGNOSIS — R400 Somnolence: Secondary | ICD-10-CM | POA: Diagnosis present

## 2016-12-29 DIAGNOSIS — F419 Anxiety disorder, unspecified: Secondary | ICD-10-CM | POA: Diagnosis not present

## 2016-12-29 DIAGNOSIS — E162 Hypoglycemia, unspecified: Secondary | ICD-10-CM | POA: Diagnosis present

## 2016-12-29 DIAGNOSIS — I10 Essential (primary) hypertension: Secondary | ICD-10-CM | POA: Diagnosis present

## 2016-12-29 DIAGNOSIS — K219 Gastro-esophageal reflux disease without esophagitis: Secondary | ICD-10-CM | POA: Insufficient documentation

## 2016-12-29 DIAGNOSIS — J45909 Unspecified asthma, uncomplicated: Secondary | ICD-10-CM | POA: Diagnosis not present

## 2016-12-29 DIAGNOSIS — I214 Non-ST elevation (NSTEMI) myocardial infarction: Secondary | ICD-10-CM | POA: Diagnosis present

## 2016-12-29 DIAGNOSIS — E785 Hyperlipidemia, unspecified: Secondary | ICD-10-CM | POA: Diagnosis not present

## 2016-12-29 HISTORY — DX: Hypoglycemia, unspecified: E16.2

## 2016-12-29 LAB — CBG MONITORING, ED: GLUCOSE-CAPILLARY: 111 mg/dL — AB (ref 65–99)

## 2016-12-29 NOTE — ED Triage Notes (Signed)
Patient arrived with EMS from [facility] drive thru found unresponsive with CBG= 34 , received 1 ampule D10% IV by EMS - CBG increased to 147 , somnolent at arrival , respirations unlabored .

## 2016-12-30 ENCOUNTER — Encounter (HOSPITAL_COMMUNITY): Payer: Self-pay | Admitting: Internal Medicine

## 2016-12-30 ENCOUNTER — Emergency Department (HOSPITAL_COMMUNITY): Payer: Medicaid Other

## 2016-12-30 DIAGNOSIS — E162 Hypoglycemia, unspecified: Secondary | ICD-10-CM | POA: Diagnosis not present

## 2016-12-30 HISTORY — DX: Hypoglycemia, unspecified: E16.2

## 2016-12-30 LAB — CBC WITH DIFFERENTIAL/PLATELET
BASOS PCT: 1 %
BASOS PCT: 1 %
Basophils Absolute: 0 10*3/uL (ref 0.0–0.1)
Basophils Absolute: 0.1 10*3/uL (ref 0.0–0.1)
EOS ABS: 0.1 10*3/uL (ref 0.0–0.7)
Eosinophils Absolute: 0.1 10*3/uL (ref 0.0–0.7)
Eosinophils Relative: 1 %
Eosinophils Relative: 1 %
HCT: 38.9 % (ref 36.0–46.0)
HEMATOCRIT: 38.6 % (ref 36.0–46.0)
HEMOGLOBIN: 12.2 g/dL (ref 12.0–15.0)
HEMOGLOBIN: 11.8 g/dL — AB (ref 12.0–15.0)
LYMPHS ABS: 3.6 10*3/uL (ref 0.7–4.0)
LYMPHS PCT: 44 %
Lymphocytes Relative: 34 %
Lymphs Abs: 2.9 10*3/uL (ref 0.7–4.0)
MCH: 27.8 pg (ref 26.0–34.0)
MCH: 27.1 pg (ref 26.0–34.0)
MCHC: 31.4 g/dL (ref 30.0–36.0)
MCHC: 30.6 g/dL (ref 30.0–36.0)
MCV: 88.6 fL (ref 78.0–100.0)
MCV: 88.7 fL (ref 78.0–100.0)
MONO ABS: 0.5 10*3/uL (ref 0.1–1.0)
MONOS PCT: 7 %
MONOS PCT: 6 %
Monocytes Absolute: 0.6 10*3/uL (ref 0.1–1.0)
NEUTROS ABS: 3.9 10*3/uL (ref 1.7–7.7)
NEUTROS PCT: 57 %
NEUTROS PCT: 48 %
Neutro Abs: 5 10*3/uL (ref 1.7–7.7)
Platelets: 340 10*3/uL (ref 150–400)
Platelets: 329 10*3/uL (ref 150–400)
RBC: 4.39 MIL/uL (ref 3.87–5.11)
RBC: 4.35 MIL/uL (ref 3.87–5.11)
RDW: 14.6 % (ref 11.5–15.5)
RDW: 14.7 % (ref 11.5–15.5)
WBC: 8.7 10*3/uL (ref 4.0–10.5)
WBC: 8.1 10*3/uL (ref 4.0–10.5)

## 2016-12-30 LAB — HIV ANTIBODY (ROUTINE TESTING W REFLEX): HIV SCREEN 4TH GENERATION: NONREACTIVE

## 2016-12-30 LAB — COMPREHENSIVE METABOLIC PANEL
ALBUMIN: 3.7 g/dL (ref 3.5–5.0)
ALBUMIN: 3.7 g/dL (ref 3.5–5.0)
ALK PHOS: 128 U/L — AB (ref 38–126)
ALK PHOS: 131 U/L — AB (ref 38–126)
ALT: 17 U/L (ref 14–54)
ALT: 17 U/L (ref 14–54)
ANION GAP: 9 (ref 5–15)
ANION GAP: 8 (ref 5–15)
AST: 19 U/L (ref 15–41)
AST: 16 U/L (ref 15–41)
BUN: 16 mg/dL (ref 6–20)
BUN: 13 mg/dL (ref 6–20)
CHLORIDE: 108 mmol/L (ref 101–111)
CO2: 26 mmol/L (ref 22–32)
CO2: 26 mmol/L (ref 22–32)
Calcium: 9.1 mg/dL (ref 8.9–10.3)
Calcium: 9 mg/dL (ref 8.9–10.3)
Chloride: 108 mmol/L (ref 101–111)
Creatinine, Ser: 0.91 mg/dL (ref 0.44–1.00)
Creatinine, Ser: 0.85 mg/dL (ref 0.44–1.00)
GFR calc Af Amer: >60 mL/min (ref >60)
GFR calc non Af Amer: >60 mL/min (ref >60)
GFR calc non Af Amer: >60 mL/min (ref >60)
GLUCOSE: 108 mg/dL — AB (ref 65–99)
GLUCOSE: 113 mg/dL — AB (ref 65–99)
POTASSIUM: 2.8 mmol/L — AB (ref 3.5–5.1)
POTASSIUM: 3.4 mmol/L — AB (ref 3.5–5.1)
SODIUM: 143 mmol/L (ref 135–145)
SODIUM: 142 mmol/L (ref 135–145)
Total Bilirubin: 0.4 mg/dL (ref 0.3–1.2)
Total Bilirubin: 0.6 mg/dL (ref 0.3–1.2)
Total Protein: 6.4 g/dL — ABNORMAL LOW (ref 6.5–8.1)
Total Protein: 6.7 g/dL (ref 6.5–8.1)

## 2016-12-30 LAB — CBG MONITORING, ED
Glucose-Capillary: 102 mg/dL — ABNORMAL HIGH (ref 65–99)
Glucose-Capillary: 104 mg/dL — ABNORMAL HIGH (ref 65–99)
Glucose-Capillary: 112 mg/dL — ABNORMAL HIGH (ref 65–99)
Glucose-Capillary: 120 mg/dL — ABNORMAL HIGH (ref 65–99)
Glucose-Capillary: 180 mg/dL — ABNORMAL HIGH (ref 65–99)

## 2016-12-30 LAB — LACTIC ACID, PLASMA: LACTIC ACID, VENOUS: 0.9 mmol/L (ref 0.5–1.9)

## 2016-12-30 LAB — I-STAT ARTERIAL BLOOD GAS, ED
ACID-BASE EXCESS: 3 mmol/L — AB (ref 0.0–2.0)
Bicarbonate: 28.6 mmol/L — ABNORMAL HIGH (ref 20.0–28.0)
O2 Saturation: 97 %
PH ART: 7.388 (ref 7.350–7.450)
PO2 ART: 92 mmHg (ref 83.0–108.0)
TCO2: 30 mmol/L (ref 22–32)
pCO2 arterial: 47.2 mmHg (ref 32.0–48.0)

## 2016-12-30 LAB — MAGNESIUM: Magnesium: 1.9 mg/dL (ref 1.7–2.4)

## 2016-12-30 LAB — URINALYSIS, ROUTINE W REFLEX MICROSCOPIC
Bilirubin Urine: NEGATIVE
GLUCOSE, UA: NEGATIVE mg/dL
Hgb urine dipstick: NEGATIVE
Ketones, ur: NEGATIVE mg/dL
Leukocytes, UA: NEGATIVE
NITRITE: NEGATIVE
PROTEIN: 30 mg/dL — AB
Specific Gravity, Urine: 1.029 (ref 1.005–1.030)
pH: 5 (ref 5.0–8.0)

## 2016-12-30 LAB — RAPID URINE DRUG SCREEN, HOSP PERFORMED
Amphetamines: NOT DETECTED
Barbiturates: NOT DETECTED
Benzodiazepines: NOT DETECTED
Cocaine: NOT DETECTED
OPIATES: NOT DETECTED
Tetrahydrocannabinol: NOT DETECTED

## 2016-12-30 LAB — GLUCOSE, CAPILLARY
GLUCOSE-CAPILLARY: 113 mg/dL — AB (ref 65–99)
GLUCOSE-CAPILLARY: 86 mg/dL (ref 65–99)
GLUCOSE-CAPILLARY: 191 mg/dL — AB (ref 65–99)

## 2016-12-30 LAB — TROPONIN I
Troponin I: <0.03 ng/mL (ref <0.03)
Troponin I: <0.03 ng/mL (ref <0.03)

## 2016-12-30 LAB — HEMOGLOBIN A1C
Hgb A1c MFr Bld: 9.1 % — ABNORMAL HIGH (ref 4.8–5.6)
Mean Plasma Glucose: 214.47 mg/dL

## 2016-12-30 LAB — ACETAMINOPHEN LEVEL

## 2016-12-30 LAB — ETHANOL: Alcohol, Ethyl (B): <5 mg/dL (ref <5)

## 2016-12-30 LAB — SALICYLATE LEVEL

## 2016-12-30 LAB — AMMONIA: AMMONIA: 41 umol/L — AB (ref 9–35)

## 2016-12-30 MED ORDER — ATORVASTATIN CALCIUM 80 MG PO TABS
80.0000 mg | ORAL_TABLET | Freq: Every day | ORAL | Status: DC
  Administered 2016-12-30 – 2016-12-31 (×2): 80 mg via ORAL
  Filled 2016-12-30 (×3): qty 1

## 2016-12-30 MED ORDER — VITAMIN B-12 1000 MCG PO TABS
1000.0000 ug | ORAL_TABLET | Freq: Every day | ORAL | Status: DC
  Administered 2016-12-30 – 2016-12-31 (×2): 1000 ug via ORAL
  Filled 2016-12-30 (×3): qty 1

## 2016-12-30 MED ORDER — NALOXONE HCL 0.4 MG/ML IJ SOLN
0.4000 mg | Freq: Once | INTRAMUSCULAR | Status: AC
  Administered 2016-12-30: 0.4 mg via INTRAVENOUS
  Filled 2016-12-30: qty 1

## 2016-12-30 MED ORDER — ACETAMINOPHEN 650 MG RE SUPP: 650.0000 mg | Freq: Four times a day (QID) | RECTAL | Status: DC | PRN

## 2016-12-30 MED ORDER — CARVEDILOL 12.5 MG PO TABS
12.5000 mg | ORAL_TABLET | Freq: Two times a day (BID) | ORAL | Status: DC
  Administered 2016-12-30 – 2016-12-31 (×3): 12.5 mg via ORAL
  Filled 2016-12-30 (×3): qty 1

## 2016-12-30 MED ORDER — ONDANSETRON HCL 4 MG/2ML IJ SOLN
4.0000 mg | Freq: Four times a day (QID) | INTRAMUSCULAR | Status: DC | PRN
Start: 2016-12-30 — End: 2017-01-01

## 2016-12-30 MED ORDER — CLONIDINE HCL 0.2 MG PO TABS
0.2000 mg | ORAL_TABLET | Freq: Every day | ORAL | Status: DC
  Administered 2016-12-30: 0.2 mg via ORAL
  Filled 2016-12-30: qty 1

## 2016-12-30 MED ORDER — LISINOPRIL 20 MG PO TABS
20.0000 mg | ORAL_TABLET | Freq: Every day | ORAL | Status: DC
  Administered 2016-12-30: 20 mg via ORAL
  Filled 2016-12-30: qty 1

## 2016-12-30 MED ORDER — ENOXAPARIN SODIUM 40 MG/0.4ML ~~LOC~~ SOLN
40.0000 mg | Freq: Every day | SUBCUTANEOUS | Status: DC
  Administered 2016-12-30 – 2016-12-31 (×2): 40 mg via SUBCUTANEOUS
  Filled 2016-12-30 (×3): qty 0.4

## 2016-12-30 MED ORDER — ONDANSETRON HCL 4 MG PO TABS: 4.0000 mg | ORAL_TABLET | Freq: Four times a day (QID) | ORAL | Status: DC | PRN

## 2016-12-30 MED ORDER — POTASSIUM CHLORIDE CRYS ER 20 MEQ PO TBCR
40.0000 meq | EXTENDED_RELEASE_TABLET | Freq: Once | ORAL | Status: AC
  Administered 2016-12-30: 40 meq via ORAL
  Filled 2016-12-30: qty 2

## 2016-12-30 MED ORDER — TRAZODONE HCL 50 MG PO TABS: 50.0000 mg | ORAL_TABLET | Freq: Every evening | ORAL | Status: DC | PRN

## 2016-12-30 MED ORDER — OXYCODONE HCL 5 MG PO TABS
10.0000 mg | ORAL_TABLET | Freq: Three times a day (TID) | ORAL | Status: DC | PRN
  Administered 2016-12-30 – 2016-12-31 (×4): 10 mg via ORAL
  Filled 2016-12-30 (×4): qty 2

## 2016-12-30 MED ORDER — POTASSIUM CHLORIDE 10 MEQ/100ML IV SOLN
10.0000 meq | Freq: Once | INTRAVENOUS | Status: AC
  Administered 2016-12-30: 10 meq via INTRAVENOUS
  Filled 2016-12-30: qty 100

## 2016-12-30 MED ORDER — TICAGRELOR 90 MG PO TABS
90.0000 mg | ORAL_TABLET | Freq: Two times a day (BID) | ORAL | Status: DC
  Administered 2016-12-30 – 2016-12-31 (×3): 90 mg via ORAL
  Filled 2016-12-30 (×5): qty 1

## 2016-12-30 MED ORDER — ACETAMINOPHEN 325 MG PO TABS: 650.0000 mg | ORAL_TABLET | Freq: Four times a day (QID) | ORAL | Status: DC | PRN

## 2016-12-30 MED ORDER — ALBUTEROL SULFATE (2.5 MG/3ML) 0.083% IN NEBU
3.0000 mL | INHALATION_SOLUTION | Freq: Four times a day (QID) | RESPIRATORY_TRACT | Status: DC | PRN
Start: 2016-12-30 — End: 2017-01-01

## 2016-12-30 MED ORDER — GABAPENTIN 300 MG PO CAPS
300.0000 mg | ORAL_CAPSULE | Freq: Three times a day (TID) | ORAL | Status: DC
  Administered 2016-12-30 – 2016-12-31 (×4): 300 mg via ORAL
  Filled 2016-12-30 (×4): qty 1

## 2016-12-30 MED ORDER — BUPROPION HCL 75 MG PO TABS: 75.0000 mg | ORAL_TABLET | Freq: Every day | ORAL | Status: DC

## 2016-12-30 MED ORDER — OMEGA-3-ACID ETHYL ESTERS 1 G PO CAPS
1000.0000 mg | ORAL_CAPSULE | Freq: Every day | ORAL | Status: DC
  Administered 2016-12-30 – 2016-12-31 (×2): 1000 mg via ORAL
  Filled 2016-12-30 (×3): qty 1

## 2016-12-30 MED ORDER — ASPIRIN 81 MG PO CHEW
81.0000 mg | CHEWABLE_TABLET | Freq: Every day | ORAL | Status: DC
  Administered 2016-12-30 – 2016-12-31 (×2): 81 mg via ORAL
  Filled 2016-12-30 (×2): qty 1

## 2016-12-30 MED ORDER — LIVING WELL WITH DIABETES BOOK
Freq: Once | Status: AC
  Administered 2016-12-30: 17:00:00
  Filled 2016-12-30: qty 1

## 2016-12-30 MED ORDER — BUPROPION HCL ER (SR) 150 MG PO TB12
150.0000 mg | ORAL_TABLET | Freq: Every day | ORAL | Status: DC
  Administered 2016-12-30 – 2016-12-31 (×2): 150 mg via ORAL
  Filled 2016-12-30 (×3): qty 1

## 2016-12-30 NOTE — ED Notes (Addendum)
Next lactic acid plasma at 0548

## 2016-12-30 NOTE — ED Notes (Signed)
Family at bedside. 

## 2016-12-30 NOTE — Progress Notes (Signed)
Patient admitted to [phone] around 68. Patient initially placed on contact precautions for "bed bugs" but upon assessment, no signs of bites of bugs present. Patient states there was only one bug on the floor but that she has never had them at her house. Charge nurse spoke with ID who recommended cancelling contact precautions but to continue double bagging linen and trash and to notify MD if any bugs are present. Patient alert and oriented x 4 and shown how to use call system. IV infusing from LT hand with NS at 22mL/hr. Skin free of breakdown. Will continue to monitor.

## 2016-12-30 NOTE — ED Notes (Signed)
BREAKFAST ORDER--POD B16 [place] HEART HEALTHY/CARB MODIFIED--THIN LIQUIDS

## 2016-12-30 NOTE — ED Notes (Signed)
Patient found diaphoretic at this time.  CBG done.

## 2016-12-30 NOTE — Progress Notes (Signed)
Patient admitted after midnight, please see H&P.  Appears patient skipped lunch and the had a hypoglycemic episode.  Patient has been losing weight so may not need as much medication.  HgbA1C pending  Miranda Canary DO

## 2016-12-30 NOTE — H&P (Signed)
History and Physical    Miranda [name] ZOX:[id number] DOB: [birth year] DOA: 12/29/2016  PCP: [name], MD  Patient coming from: Home.  Chief Complaint: Altered mental status.  HPI: Miranda [name] is a 61 y.o. female with history of CAD status post stenting, diabetes mellitus type 2, history of stroke, hypertension was brought to the ER after patient was found to be confused with hypoglycemia. Patient states last night patient had gone to [employer] to have food when while in the drive-through patient felt getting more tired and weaker and felt like passing out. The staff at the restaurant called EMS and patient she was found to be in the 30s. Patient states she has been taking her medications as advised. Except for the change in her pain medications there has been no other changes. Patient states she did have breakfast yesterday and is about to have dinner last night when the episode happened.  ED Course: In the ER again patient's blood sugar was low and was given D50 for every blood sugar improved but patient looks still weak and confused and has been admitted for further observation. On my exam patient follows commands but appears a bit confused.  Review of Systems: As per HPI, rest all negative.   Past Medical History:  Diagnosis Date  . Acid reflux   . Anxiety   . Asthma   . Degenerative disc disease   . Depression   . Diabetes mellitus   . Hypertension   . Stroke [facility])    04/22/12    Past Surgical History:  Procedure Laterality Date  . CARDIAC CATHETERIZATION N/A 11/21/2015   Procedure: Left Heart Cath and Coronary Angiography;  Surgeon: Peter M Swaziland, MD;  Location: [facility] INVASIVE CV LAB;  Service: Cardiovascular;  Laterality: N/A;  . CARDIAC CATHETERIZATION N/A 11/21/2015   Procedure: Coronary Stent Intervention;  Surgeon: Peter M Swaziland, MD;  Location: [facility] INVASIVE CV LAB;  Service: Cardiovascular;  Laterality: N/A;  . KNEE SURGERY       reports that she has been  smoking Cigarettes.  She has been smoking about 0.25 packs per day. She has never used smokeless tobacco. She reports that she does not drink alcohol or use drugs.  No Known Allergies  Family History  Problem Relation Age of Onset  . Hypertension Other     Prior to Admission medications   Medication Sig Start Date End Date Taking? Authorizing Provider  albuterol (PROVENTIL HFA;VENTOLIN HFA) 108 (90 Base) MCG/ACT inhaler Inhale 2 puffs into the lungs every 6 (six) hours as needed for wheezing or shortness of breath.    [provider]  aspirin 81 MG chewable tablet Chew 1 tablet (81 mg total) by mouth daily. 11/22/15   Zannie Cove, MD  atorvastatin (LIPITOR) 80 MG tablet TAKE 1 TABLET EVERY DAY AT 6 PM 02/01/16   [name], MD  BRILINTA 90 MG TABS tablet TAKE 1 TABLET BY MOUTH TWICE A DAY 07/09/16   [name], MD  buPROPion (WELLBUTRIN) 75 MG tablet Take 75 mg by mouth daily.    [provider]  carvedilol (COREG) 12.5 MG tablet Take 1 tablet (12.5 mg total) by mouth 2 (two) times daily with a meal. 07/08/16   [name], MD  cholecalciferol (VITAMIN D) 1000 UNITS tablet Take 3,000 Units by mouth daily.    [provider]  cloNIDine (CATAPRES) 0.2 MG tablet Take 0.2 mg by mouth at bedtime.    [provider]  Cyanocobalamin (  B-12) 1000 MCG CAPS Take 1 tablet by mouth daily.    [provider]  gabapentin (NEURONTIN) 300 MG capsule Take 300 mg by mouth 3 (three) times daily.     [provider]  hydrochlorothiazide (HYDRODIURIL) 25 MG tablet Take 1 tablet (25 mg total) by mouth daily. <PLEASE MAKE APPOINTMENT FOR REFILLS> 12/08/16 03/08/17  [name], MD  insulin glargine (LANTUS) 100 UNIT/ML injection Inject 50 Units into the skin 2 (two) times daily.    [provider]  lisinopril (PRINIVIL,ZESTRIL) 20 MG tablet Take 1 tablet (20 mg total) by mouth at bedtime. Restart tomorrow 12/28/15   [name], MD  metFORMIN (GLUMETZA) 1000 MG (MOD) 24 hr tablet Take 1 tablet (1,000 mg total) by mouth 2 (two) times daily with a meal. Restart tomorrow 11/23/15   Zannie Cove, MD  nitroGLYCERIN (NITROSTAT) 0.4 MG SL tablet Place 1 tablet (0.4 mg total) under the tongue every 5 (five) minutes as needed for chest pain. Patient not taking: Reported on 01/11/2016 01/03/16 04/02/16  Robbie Lis M, PA-C  Omega-3 Fatty Acids (FISH OIL) 1000 MG CAPS Take 1 capsule by mouth daily.    [provider]  OxyCODONE ER (XTAMPZA ER) 18 MG C12A Take by mouth every 12 (twelve) hours.    [provider]  Oxycodone HCl 10 MG TABS Take 10 mg by mouth 3 (three) times daily as needed (pain).     Schinlever, Santina Evans, PA-C  oxymorphone (OPANA ER) 20 MG 12 hr tablet Take 1 tablet (20 mg total) by mouth every 12 (twelve) hours. 11/22/15   Zannie Cove, MD  saxagliptin HCl (ONGLYZA) 2.5 MG TABS tablet Take 5 mg by mouth every morning.    [provider]  traZODone (DESYREL) 50 MG tablet Take 50 mg by mouth at bedtime as needed for sleep.     [provider]  Vitamin D, Ergocalciferol, (DRISDOL) 50000 units CAPS capsule Take 50,000 Units by mouth every 7 (seven) days.    [provider]    Physical Exam: Vitals:   12/30/16 0230 12/30/16 0400 12/30/16 0430 12/30/16 0500  BP: (!) 147/80 (!) 146/85 (!) 142/81 138/87  Pulse: 66 65 75 74  Resp: (!) 26  Temp:      TempSrc:      SpO2: 97% 100% 98% 97%  Height:          Constitutional: Moderately built and nourished. Vitals:   12/30/16 0230 12/30/16 0400 12/30/16 0430 12/30/16 0500  BP: (!) 147/80 (!) 146/85 (!) 142/81 138/87  Pulse: 66 65 75 74  Resp: (!) 26  Temp:      TempSrc:      SpO2: 97% 100% 98% 97%  Height:       Eyes: Anicteric no pallor. ENMT: No discharge from the ears eyes nose or mouth. Neck: No neck rigidity no mass felt. Respiratory: No rhonchi or  crepitations. Cardiovascular: S1 and S2 heard no murmurs appreciated. Abdomen: Soft nontender bowel sounds present. No guarding or rigidity. Musculoskeletal: No edema. No joint effusion. Skin: No rash. The skin appears warm. Neurologic: Alert awake oriented to time place and person but appears mildly confused. Moves all extremities. Psychiatric: Alert awake oriented to time place and person. But appears mildly confused.   Labs on Admission: I have personally reviewed following labs and imaging studies  CBC:  Recent Labs Lab 12/30/16 0111  WBC 8.7  NEUTROABS 5.0  HGB 12.2  HCT 38.9  MCV 88.6  PLT 340   Basic Metabolic Panel:  Recent Labs Lab 12/30/16 0111  NA 143  K 2.8*  CL 108  CO2 26  GLUCOSE 108*  BUN 16  CREATININE 0.91  CALCIUM 9.1   GFR: CrCl cannot be calculated (Unknown ideal weight.). Liver Function Tests:  Recent Labs Lab 12/30/16 0111  AST 19  ALT 17  ALKPHOS 128*  BILITOT 0.4  PROT 6.4*  ALBUMIN 3.7   No results for input(s): LIPASE, AMYLASE in the last 168 hours.  Recent Labs Lab 12/30/16 0248  AMMONIA 41*   Coagulation Profile: No results for input(s): INR, PROTIME in the last 168 hours. Cardiac Enzymes:  Recent Labs Lab 12/30/16 0111  TROPONINI <0.03   BNP (last 3 results) No results for input(s): PROBNP in the last 8760 hours. HbA1C: No results for input(s): HGBA1C in the last 72 hours. CBG:  Recent Labs Lab 12/29/16 2348 12/30/16 0034 12/30/16 0335  GLUCAP 111* 120* 112*   Lipid Profile: No results for input(s): CHOL, HDL, LDLCALC, TRIG, CHOLHDL, LDLDIRECT in the last 72 hours. Thyroid Function Tests: No results for input(s): TSH, T4TOTAL, FREET4, T3FREE, THYROIDAB in the last 72 hours. Anemia Panel: No results for input(s): VITAMINB12, FOLATE, FERRITIN, TIBC, IRON, RETICCTPCT in the last 72 hours. Urine analysis:    Component Value Date/Time   COLORURINE YELLOW 12/30/2016 0433   APPEARANCEUR HAZY (A)  12/30/2016 0433   LABSPEC 1.029 12/30/2016 0433   PHURINE 5.0 12/30/2016 0433   GLUCOSEU NEGATIVE 12/30/2016 0433   HGBUR NEGATIVE 12/30/2016 0433   BILIRUBINUR NEGATIVE 12/30/2016 0433   KETONESUR NEGATIVE 12/30/2016 0433   PROTEINUR 30 (A) 12/30/2016 0433   UROBILINOGEN 1.0 04/22/2012 1416   NITRITE NEGATIVE 12/30/2016 0433   LEUKOCYTESUR NEGATIVE 12/30/2016 0433   Sepsis Labs: (procalcitonin:4,lacticidven:4) )No results found for this or any previous visit (from the past 240 hour(s)).   Radiological Exams on Admission: Ct Head Wo Contrast  Result Date: 12/30/2016 CLINICAL DATA:  Patient found unresponsive. EXAM: CT HEAD WITHOUT CONTRAST TECHNIQUE: Contiguous axial images were obtained from the base of the skull through the vertex without intravenous contrast. COMPARISON:  Brain MRI 05/31/2014 and head CT 05/31/2014 FINDINGS: Brain: No mass lesion, intraparenchymal hemorrhage or extra-axial collection. No evidence of acute cortical infarct. 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 Vascular: No hyperdense vessel or unexpected calcification. Skull: Normal visualized skull base, calvarium and extracranial soft tissues. Sinuses/Orbits: No sinus fluid levels or advanced mucosal thickening. No mastoid effusion. Normal orbits. IMPRESSION: Old left corona radiata infarct but no acute intracranial abnormality. Electronically Signed   By: Deatra Robinson M.D.   On: 12/30/2016 02:43    EKG: Independently reviewed. Normal sinus rhythm.  Assessment/Plan Principal Problem:   Hypoglycemia Active Problems:   Stroke (HCC)   Diabetes mellitus (HCC)   Essential hypertension   NSTEMI (non-ST elevated myocardial infarction) (HCC)    1. Hypoglycemia and diabetes mellitus type 2 - patient takes Lantus 50 units twice a day along with metformin and Onglyza. Patient has not taken Onglyza for last few weeks since patient was not able to get it. For now I'm holding off all diabetic medications and keeping  patient on CBG checks every 4. Once patient's blood sugars consistently high*sliding scale. What precipitated patient's hypoglycemia is not clear. 2. CAD status post stenting - we'll continue antiplatelet agents statins and Coreg. 3. Hypertension - continue home medications. 4. History of stroke - continue antiplatelet agents and statins.   DVT prophylaxis: Lovenox. Code Status: Full  code.  Family Communication: Discussed with patient.  Disposition Plan: Home.  Consults called: None.  Admission status: Observation.    Eduard Clos MD [facility] Pager [phone].  If 7PM-7AM, please contact night-coverage [email or web address] Password [email or web address]  12/30/2016, 5:50 AM

## 2016-12-30 NOTE — ED Notes (Signed)
To CT

## 2016-12-30 NOTE — ED Notes (Signed)
Pt received meal tray. 

## 2016-12-30 NOTE — Progress Notes (Signed)
Patient complains of discomfort on the left lower breast to the left ribcage area that started yesterday. Dr. Benjamine Mola notified and said she would put in orders. Will continue to monitor.

## 2016-12-30 NOTE — Progress Notes (Signed)
Inpatient Diabetes Program Recommendations  AACE/ADA: New Consensus Statement on Inpatient Glycemic Control (2015)  Target Ranges:  Prepandial:   less than 140 mg/dL      Peak postprandial:   less than 180 mg/dL (1-2 hours)      Critically ill patients:  140 - 180 mg/dL   Lab Results  Component Value Date   GLUCAP 113 (H) 12/30/2016   HGBA1C 9.1 (H) 12/30/2016    Review of Glycemic ControlResults for MCKINLEIGH, SCHUCHART (MRN [id number]) as of 12/30/2016 16:27  Ref. Range 12/30/2016 06:16 12/30/2016 07:30 12/30/2016 11:59 12/30/2016 15:00  Glucose-Capillary Latest Ref Range: 65 - 99 mg/dL 782 (H) 956 (H) 213 (H) 113 (H)    Diabetes history: Type 2 diabetes Outpatient Diabetes medications: Lantus 50 units bid, Metformin 1000 mg bid, Onglyza 5 mg daily Current orders for Inpatient glycemic control:  None  Inpatient Diabetes Program Recommendations:    Note patient admitted with low blood sugars.  Discussed in detail with patient.  She states that she has been trying to eat better lately and eating mostly salads.  She also endorses being more active which can also increase potential for low blood sugars.  We discussed signs, symptoms and treatment of low blood sugars.  Patient states that she has noticed that she has been sweating more recently however has not checked blood sugars with sweating?  We discussed that Lantus lasts for 24 hours and should not cover food.  May need reduced doses of insulin at d/c.  Explained that low blood sugar is dangerous and needs to be avoided and/or treated immediately.  She is interested in more information regarding diet.  Will order Living well with Diabetes booklet for patient.  Told RN to call MD when blood sugars>200 mg/dL for correction insulin.    Thanks, Beryl Meager, RN, BC-ADM Inpatient Diabetes Coordinator Pager [phone] (8a-5p)

## 2016-12-30 NOTE — ED Provider Notes (Signed)
[facility]-EMERGENCY DEPT Provider Note   CSN: [id number] Arrival date & time: 12/29/16  2337     History   Chief Complaint Chief Complaint  Patient presents with  . Hypoglycemia    HPI Miranda [name] is a 61 y.o. female.  Patient arrives via EMS after being found unresponsive in her car in a drive-thru. Per EMS, CBG on scene was 34. She received an amp of D50 with increased blood sugar to 147. Per report, she continued to be somnolent but responsive. The patient complains of pain in her lower back only. She is oriented. She falls back to sleep quickly without stimulation of some kind. VSS. CBG on arrival 118. Per chart, she has a history of CAD, CVA, DM, HLD.   The history is provided by the patient and the EMS personnel. No language interpreter was used.  Hypoglycemia    Past Medical History:  Diagnosis Date  . Acid reflux   . Anxiety   . Asthma   . Degenerative disc disease   . Depression   . Diabetes mellitus   . Hypertension   . Stroke [facility])    04/22/12    Patient Active Problem List   Diagnosis Date Noted  . Type 2 diabetes mellitus with hyperglycemia (HCC) 11/21/2015  . Hyperlipidemia 11/21/2015  . Chest pain 11/21/2015  . NSTEMI (non-ST elevated myocardial infarction) (HCC)   . Pain in the chest 11/20/2015  . TIA (transient ischemic attack) 05/31/2014  . Stroke (HCC) 04/22/2012  . Hypokalemia 04/22/2012  . Diabetes mellitus (HCC) 04/22/2012  . Essential hypertension 04/22/2012    Past Surgical History:  Procedure Laterality Date  . CARDIAC CATHETERIZATION N/A 11/21/2015   Procedure: Left Heart Cath and Coronary Angiography;  Surgeon: Peter M Swaziland, MD;  Location: [facility] INVASIVE CV LAB;  Service: Cardiovascular;  Laterality: N/A;  . CARDIAC CATHETERIZATION N/A 11/21/2015   Procedure: Coronary Stent Intervention;  Surgeon: Peter M Swaziland, MD;  Location: [facility] INVASIVE CV LAB;  Service: Cardiovascular;  Laterality: N/A;  . KNEE SURGERY      OB History    No data  available       Home Medications    Prior to Admission medications   Medication Sig Start Date End Date Taking? Authorizing Provider  albuterol (PROVENTIL HFA;VENTOLIN HFA) 108 (90 Base) MCG/ACT inhaler Inhale 2 puffs into the lungs every 6 (six) hours as needed for wheezing or shortness of breath.    [provider]  aspirin 81 MG chewable tablet Chew 1 tablet (81 mg total) by mouth daily. 11/22/15   Zannie Cove, MD  atorvastatin (LIPITOR) 80 MG tablet TAKE 1 TABLET EVERY DAY AT 6 PM 02/01/16   [name], MD  BRILINTA 90 MG TABS tablet TAKE 1 TABLET BY MOUTH TWICE A DAY 07/09/16   [name], MD  buPROPion (WELLBUTRIN) 75 MG tablet Take 75 mg by mouth daily.    [provider]  carvedilol (COREG) 12.5 MG tablet Take 1 tablet (12.5 mg total) by mouth 2 (two) times daily with a meal. 07/08/16   [name], MD  cholecalciferol (VITAMIN D) 1000 UNITS tablet Take 3,000 Units by mouth daily.    [provider]  cloNIDine (CATAPRES) 0.2 MG tablet Take 0.2 mg by mouth at bedtime.    [provider]  Cyanocobalamin (B-12) 1000 MCG CAPS Take 1 tablet by mouth daily.    [provider]  gabapentin (NEURONTIN) 300 MG capsule Take 300 mg by mouth 3 (three)  times daily.     [provider]  hydrochlorothiazide (HYDRODIURIL) 25 MG tablet Take 1 tablet (25 mg total) by mouth daily. <PLEASE MAKE APPOINTMENT FOR REFILLS> 12/08/16 03/08/17  [name], MD  insulin glargine (LANTUS) 100 UNIT/ML injection Inject 50 Units into the skin 2 (two) times daily.    [provider]  lisinopril (PRINIVIL,ZESTRIL) 20 MG tablet Take 1 tablet (20 mg total) by mouth at bedtime. Restart tomorrow 12/28/15   [name], MD  metFORMIN (GLUMETZA) 1000 MG (MOD) 24 hr tablet Take 1 tablet (1,000 mg total) by mouth 2 (two) times daily with a meal. Restart tomorrow 11/23/15   Zannie Cove, MD  nitroGLYCERIN (NITROSTAT) 0.4 MG SL tablet  Place 1 tablet (0.4 mg total) under the tongue every 5 (five) minutes as needed for chest pain. Patient not taking: Reported on 01/11/2016 01/03/16 04/02/16  Robbie Lis M, PA-C  Omega-3 Fatty Acids (FISH OIL) 1000 MG CAPS Take 1 capsule by mouth daily.    [provider]  OxyCODONE ER (XTAMPZA ER) 18 MG C12A Take by mouth every 12 (twelve) hours.    [provider]  Oxycodone HCl 10 MG TABS Take 10 mg by mouth 3 (three) times daily as needed (pain).     Schinlever, Santina Evans, PA-C  oxymorphone (OPANA ER) 20 MG 12 hr tablet Take 1 tablet (20 mg total) by mouth every 12 (twelve) hours. 11/22/15   Zannie Cove, MD  saxagliptin HCl (ONGLYZA) 2.5 MG TABS tablet Take 5 mg by mouth every morning.    [provider]  traZODone (DESYREL) 50 MG tablet Take 50 mg by mouth at bedtime as needed for sleep.     [provider]  Vitamin D, Ergocalciferol, (DRISDOL) 50000 units CAPS capsule Take 50,000 Units by mouth every 7 (seven) days.    [provider]    Family History Family History  Problem Relation Age of Onset  . Hypertension Other     Social History Social History  Substance Use Topics  . Smoking status: Current Every Day Smoker    Packs/day: 0.25    Types: Cigarettes  . Smokeless tobacco: Never Used  . Alcohol use No     Allergies   Patient has no known allergies.   Review of Systems Review of Systems  Unable to perform ROS: Mental status change     Physical Exam Updated Vital Signs BP 120/63 (BP Location: Right Arm)   Pulse 72   Temp 97.6 F (36.4 C) (Oral)   Resp 18   Ht  (1.778 m)   SpO2 93%   Physical Exam  Constitutional: She appears well-developed and well-nourished.  HENT:  Head: Normocephalic and atraumatic.  Eyes: Pupils are equal, round, and reactive to light.  Neck: Normal range of motion. Neck supple.  Cardiovascular: Normal rate.   No murmur heard. Pulmonary/Chest: Effort normal. She has no  wheezes. She has no rales. She exhibits no tenderness.  Abdominal: Soft. There is no tenderness.  Musculoskeletal: Normal range of motion. She exhibits no edema.  Neurological:   No facial asymmetry. No lateralizing weakness. Speech is slurred but oriented.      ED Treatments / Results  Labs (all labs ordered are listed, but only abnormal results are displayed) Labs Reviewed  CBG MONITORING, ED - Abnormal; Notable for the following:       Result Value   Glucose-Capillary 111 (*)    All other components within normal limits  CBG MONITORING, ED -  Abnormal; Notable for the following:    Glucose-Capillary 120 (*)    All other components within normal limits  CBC WITH DIFFERENTIAL/PLATELET  COMPREHENSIVE METABOLIC PANEL  TROPONIN I  ETHANOL  RAPID URINE DRUG SCREEN, HOSP PERFORMED  URINALYSIS, ROUTINE W REFLEX MICROSCOPIC    EKG  EKG Interpretation None       Radiology No results found.  Procedures Procedures (including critical care time)  Medications Ordered in ED Medications - No data to display   Initial Impression / Assessment and Plan / ED Course  I have reviewed the triage vital signs and the nursing notes.  Pertinent labs & imaging results that were available during my care of the patient were reviewed by me and considered in my medical decision making (see chart for details).     Patient extremely somnolent on arrival but wakes with minimal stimulation and answers questions. Speech slow and slurred but oriented.   12:30 - Recheck of CBG shows normal levels without change in mental status. Labs, imaging pending. EKG without ischemic change.  1:30 - Re-evaluation - the patient remains somnolent. Oriented when woken up but does not stay awake. VSS  3:00 - Continues to sleep. Labs show hypokalemia. Supplement ordered. Plan: admit for persistent AMS. Ammonia pending - will cal for admission when resulted.   4:10 - Continues to sleep. VSS. Ammonia slightly  elevated at 41. Discussed admission with Dr. Toniann Fail [facility] who accepts onto his service.   Final Clinical Impressions(s) / ED Diagnoses   Final diagnoses:  None   1. AMS 2. Hypokalemia 3. Hypoglycemia  New Prescriptions New Prescriptions   No medications on file     Elpidio Anis, Cordelia Poche 12/30/16 Asencion Noble, MD 12/30/16 [phone]

## 2016-12-31 ENCOUNTER — Observation Stay (HOSPITAL_COMMUNITY): Payer: Medicaid Other

## 2016-12-31 DIAGNOSIS — I10 Essential (primary) hypertension: Secondary | ICD-10-CM | POA: Diagnosis not present

## 2016-12-31 DIAGNOSIS — E162 Hypoglycemia, unspecified: Secondary | ICD-10-CM | POA: Diagnosis not present

## 2016-12-31 DIAGNOSIS — E876 Hypokalemia: Secondary | ICD-10-CM

## 2016-12-31 LAB — GLUCOSE, CAPILLARY
GLUCOSE-CAPILLARY: 194 mg/dL — AB (ref 65–99)
GLUCOSE-CAPILLARY: 243 mg/dL — AB (ref 65–99)
Glucose-Capillary: 142 mg/dL — ABNORMAL HIGH (ref 65–99)
Glucose-Capillary: 181 mg/dL — ABNORMAL HIGH (ref 65–99)
Glucose-Capillary: 215 mg/dL — ABNORMAL HIGH (ref 65–99)
Glucose-Capillary: 238 mg/dL — ABNORMAL HIGH (ref 65–99)

## 2016-12-31 MED ORDER — METFORMIN HCL ER 500 MG PO TB24
1000.0000 mg | ORAL_TABLET | Freq: Two times a day (BID) | ORAL | Status: DC
  Filled 2016-12-31: qty 2

## 2016-12-31 MED ORDER — INSULIN ASPART 100 UNIT/ML ~~LOC~~ SOLN
0.0000 [IU] | Freq: Three times a day (TID) | SUBCUTANEOUS | Status: DC
  Administered 2016-12-31 (×2): 3 [IU] via SUBCUTANEOUS

## 2016-12-31 MED ORDER — INSULIN GLARGINE 100 UNIT/ML ~~LOC~~ SOLN: 20.0000 [IU] | Freq: Two times a day (BID) | SUBCUTANEOUS | 11 refills | Status: DC

## 2016-12-31 MED ORDER — LINAGLIPTIN 5 MG PO TABS
5.0000 mg | ORAL_TABLET | Freq: Every day | ORAL | Status: DC
  Administered 2016-12-31: 5 mg via ORAL
  Filled 2016-12-31: qty 1

## 2016-12-31 MED ORDER — SALINE SPRAY 0.65 % NA SOLN
1.0000 | NASAL | Status: DC | PRN
  Filled 2016-12-31: qty 44

## 2016-12-31 MED ORDER — NALOXEGOL OXALATE 25 MG PO TABS
25.0000 mg | ORAL_TABLET | Freq: Every day | ORAL | Status: DC
  Administered 2016-12-31: 25 mg via ORAL
  Filled 2016-12-31 (×2): qty 1

## 2016-12-31 MED ORDER — PHENOL 1.4 % MT LIQD: 1.0000 | OROMUCOSAL | Status: DC | PRN

## 2016-12-31 MED ORDER — HYDROCOD POLST-CPM POLST ER 10-8 MG/5ML PO SUER
5.0000 mL | Freq: Once | ORAL | Status: AC
  Administered 2016-12-31: 5 mL via ORAL
  Filled 2016-12-31: qty 5

## 2016-12-31 MED ORDER — INSULIN ASPART 100 UNIT/ML ~~LOC~~ SOLN: 0.0000 [IU] | Freq: Every day | SUBCUTANEOUS | Status: DC

## 2016-12-31 NOTE — Progress Notes (Signed)
Notified Schorr, NP that pt woke up with congestion and cough. Pt's throat is hurting. Pt's voice is hoarse. Pt requesting cough medication. Will continue to monitor pt. Nelda Marseille, RN

## 2016-12-31 NOTE — Progress Notes (Signed)
NUTRITION EDUCATION NOTE  RD was consulted to provide diet education for diabetes. Pt has had diabetes for 5 years. Pt states that after her diagnosis she has been eating healthier by choosing whole grains, reading food labels, and limiting fried foods and soda. Pt has cut out salt and plans to buy Mrs. Dash blends at home. Pt states that she has been consuming more salads, vegetables, and has started grilling meats instead of frying them.Pt reports going from a size 26 to a size 16 due to her lifestyle changes. Pt reports pain that keeps her from exercising regularly. Pt received DM education upon diagnosis and met with the Diabetes Coordinator on 9/19. Pt typically eats 3 meals a day with snacks in between or eats two large meals, one in the morning and the other in the evening. Provided pt with "Carb Counting for people with Diabetes" handout.  Pt asked questions about following a diabetic diet while eating out at restaurants.Utilized teach back method. Pt was very receptive to education and although she seemed overwhelmed at all the changes, good compliance is expected.  Pt on carb modified diet.  Body mass index is 33.68 kg/m.  No further nutrition needs identified at this time. If further nutrition needs arise, please reconsult RD.   [place] Dietetic Intern Pager: [phone] 12/31/2016 4:51 PM

## 2016-12-31 NOTE — Discharge Summary (Signed)
Physician Discharge Summary  Miranda [name] RUE:[id number] DOB: [birth year] DOA: 12/29/2016  PCP: [name], MD  Admit date: 12/29/2016 Discharge date: 12/31/2016   Recommendations for Outpatient Follow-Up:   1. Needs to bring blood sugar log to PCP 2. Have adjusted lantus   Discharge Diagnosis:   Principal Problem:   Hypoglycemia Active Problems:   Stroke (HCC)   Diabetes mellitus (HCC)   Essential hypertension   NSTEMI (non-ST elevated myocardial infarction) [facility])   Discharge disposition:  Home. :  Discharge Condition: Improved.  Diet recommendation: Low sodium, heart healthy.  Carbohydrate-modified  Wound care: None.   History of Present Illness:   Miranda [name] is a 61 y.o. female with history of CAD status post stenting, diabetes mellitus type 2, history of stroke, hypertension was brought to the ER after patient was found to be confused with hypoglycemia. Patient states last night patient had gone to [employer] to have food when while in the drive-through patient felt getting more tired and weaker and felt like passing out. The staff at the restaurant called EMS and patient she was found to be in the 30s. Patient states she has been taking her medications as advised. Except for the change in her pain medications there has been no other changes. Patient states she did have breakfast yesterday and is about to have dinner last night when the episode happened.   Hospital Course by Problem:   Hypoglycemia -suspect needs less medications as she states she is eating better and working out- continue PO meds with decreased lantus dose -close follow up with PCP  HTN -decrease medications as BP running low here -? compliance  Tobacco abuse -encouraged cessation    Medical Consultants:    None.   Discharge Exam:   Vitals:   12/31/16 0821 12/31/16 1502  BP: (!) 103/53 (!) 108/52  Pulse: 65 63  Resp: 17 20  Temp: 97.7 F (36.5 C) 98.2 F (36.8 C)    SpO2: 99% 100%   Vitals:   12/30/16 2119 12/31/16 0434 12/31/16 0821 12/31/16 1502  BP: 125/60 134/60 (!) 103/53 (!) 108/52  Pulse: 68 70 65 63  Resp: Temp: 98.2 F (36.8 C) 97.6 F (36.4 C) 97.7 F (36.5 C) 98.2 F (36.8 C)  TempSrc: Oral Oral Oral Oral  SpO2: 96% 99% 99% 100%  Weight:  106.5 kg (234 lb 11.2 oz)    Height:        Gen:  NAD   The results of significant diagnostics from this hospitalization (including imaging, microbiology, ancillary and laboratory) are listed below for reference.     Procedures and Diagnostic Studies:   Ct Head Wo Contrast  Result Date: 12/30/2016 CLINICAL DATA:  Patient found unresponsive. EXAM: CT HEAD WITHOUT CONTRAST TECHNIQUE: Contiguous axial images were obtained from the base of the skull through the vertex without intravenous contrast. COMPARISON:  Brain MRI 05/31/2014 and head CT 05/31/2014 FINDINGS: Brain: No mass lesion, intraparenchymal hemorrhage or extra-axial collection. No evidence of acute cortical infarct. 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 Vascular: No hyperdense vessel or unexpected calcification. Skull: Normal visualized skull base, calvarium and extracranial soft tissues. Sinuses/Orbits: No sinus fluid levels or advanced mucosal thickening. No mastoid effusion. Normal orbits. IMPRESSION: Old left corona radiata infarct but no acute intracranial abnormality. Electronically Signed   By: Deatra Robinson M.D.   On: 12/30/2016 02:43     Labs:   Basic Metabolic Panel:  Recent Labs Lab 12/30/16 0111 12/30/16 0600 12/30/16  1259  NA 143 142  --   K 2.8* 3.4*  --   CL 108 108  --   CO2 26 26  --   GLUCOSE 108* 113*  --   BUN 16 13  --   CREATININE 0.91 0.85  --   CALCIUM 9.1 9.0  --   MG  --   --  1.9   GFR Estimated Creatinine Clearance: 93 mL/min (by C-G formula based on SCr of 0.85 mg/dL). Liver Function Tests:  Recent Labs Lab 12/30/16 0111 12/30/16 0600  AST 19 16  ALT 17 17  ALKPHOS 128* 131*   BILITOT 0.4 0.6  PROT 6.4* 6.7  ALBUMIN 3.7 3.7   No results for input(s): LIPASE, AMYLASE in the last 168 hours.  Recent Labs Lab 12/30/16 0248  AMMONIA 41*   Coagulation profile No results for input(s): INR, PROTIME in the last 168 hours.  CBC:  Recent Labs Lab 12/30/16 0111 12/30/16 0600  WBC 8.7 8.1  NEUTROABS 5.0 3.9  HGB 12.2 11.8*  HCT 38.9 38.6  MCV 88.6 88.7  PLT 340 329   Cardiac Enzymes:  Recent Labs Lab 12/30/16 0111 12/30/16 0600 12/30/16 1259 12/30/16 1811  TROPONINI <0.03 <0.03 <0.03 <0.03   BNP: Invalid input(s): POCBNP CBG:  Recent Labs Lab 12/31/16 0013 12/31/16 0443 12/31/16 0818 12/31/16 1219 12/31/16 1634  GLUCAP 181* 142* 194* 215* 243*   D-Dimer No results for input(s): DDIMER in the last 72 hours. Hgb A1c  Recent Labs  12/30/16 1139  HGBA1C 9.1*   Lipid Profile No results for input(s): CHOL, HDL, LDLCALC, TRIG, CHOLHDL, LDLDIRECT in the last 72 hours. Thyroid function studies No results for input(s): TSH, T4TOTAL, T3FREE, THYROIDAB in the last 72 hours.  Invalid input(s): FREET3 Anemia work up No results for input(s): VITAMINB12, FOLATE, FERRITIN, TIBC, IRON, RETICCTPCT in the last 72 hours. Microbiology No results found for this or any previous visit (from the past 240 hour(s)).   Discharge Instructions:   Discharge Instructions    Diet - low sodium heart healthy    Complete by:  As directed    Diet Carb Modified    Complete by:  As directed    Discharge instructions    Complete by:  As directed    Eat regular meals while taking diabetic medications Bring log of blood sugars to PCP Keep up the good work of eating well and exercising!   Increase activity slowly    Complete by:  As directed      Allergies as of 12/31/2016   No Known Allergies     Medication List    STOP taking these medications   amLODipine 10 MG tablet Commonly known as:  NORVASC   cholecalciferol 1000 units tablet Commonly  known as:  VITAMIN D   cyclobenzaprine 10 MG tablet Commonly known as:  FLEXERIL   hydrochlorothiazide 25 MG tablet Commonly known as:  HYDRODIURIL   nitroGLYCERIN 0.4 MG SL tablet Commonly known as:  NITROSTAT     TAKE these medications   ACCU-CHEK AVIVA PLUS test strip Generic drug:  glucose blood 1 each as needed.   albuterol 108 (90 Base) MCG/ACT inhaler Commonly known as:  PROVENTIL HFA;VENTOLIN HFA Inhale 2 puffs into the lungs every 6 (six) hours as needed for wheezing or shortness of breath.   aspirin 81 MG chewable tablet Chew 1 tablet (81 mg total) by mouth daily.   atorvastatin 80 MG tablet Commonly known as:  LIPITOR TAKE 1 TABLET EVERY  DAY AT 6 PM   B-12 1000 MCG Caps Take 1 tablet by mouth daily.   B-D ULTRAFINE III SHORT PEN 31G X 8 MM Misc Generic drug:  Insulin Pen Needle 1 each as needed.   BRILINTA 90 MG Tabs tablet Generic drug:  ticagrelor TAKE 1 TABLET BY MOUTH TWICE A DAY   buPROPion 150 MG 12 hr tablet Commonly known as:  WELLBUTRIN SR Take 150 mg by mouth daily.   carvedilol 12.5 MG tablet Commonly known as:  COREG Take 1 tablet (12.5 mg total) by mouth 2 (two) times daily with a meal.   cloNIDine 0.2 MG tablet Commonly known as:  CATAPRES Take 0.2 mg by mouth daily as needed.   Fish Oil 1000 MG Caps Take 1 capsule by mouth daily as needed (for vitamin).   gabapentin 300 MG capsule Commonly known as:  NEURONTIN Take 300 mg by mouth 3 (three) times daily.   insulin glargine 100 UNIT/ML injection Commonly known as:  LANTUS Inject 0.2 mLs (20 Units total) into the skin 2 (two) times daily. What changed:  how much to take   lisinopril 20 MG tablet Commonly known as:  PRINIVIL,ZESTRIL Take 1 tablet (20 mg total) by mouth at bedtime. Restart tomorrow   metFORMIN 1000 MG (MOD) 24 hr tablet Commonly known as:  GLUMETZA Take 1 tablet (1,000 mg total) by mouth 2 (two) times daily with a meal. Restart tomorrow   MOVANTIK 25 MG Tabs  tablet Generic drug:  naloxegol oxalate Take 25 mg by mouth every morning.   ONGLYZA 2.5 MG Tabs tablet Generic drug:  saxagliptin HCl Take 5 mg by mouth every morning.   Oxycodone HCl 10 MG Tabs Take 10 mg by mouth 3 (three) times daily as needed (pain).   traZODone 50 MG tablet Commonly known as:  DESYREL Take 50 mg by mouth at bedtime as needed for sleep.   Vitamin D (Ergocalciferol) 50000 units Caps capsule Commonly known as:  DRISDOL Take 50,000 Units by mouth every 7 (seven) days. monday   XTAMPZA ER 18 MG C12a Generic drug:  OxyCODONE ER Take by mouth every 12 (twelve) hours.            Discharge Care Instructions        Start     Ordered   12/31/16 0000  insulin glargine (LANTUS) 100 UNIT/ML injection  2 times daily     12/31/16 1659   12/31/16 0000  Increase activity slowly     12/31/16 1659   12/31/16 0000  Diet - low sodium heart healthy     12/31/16 1659   12/31/16 0000  Discharge instructions    Comments:  Eat regular meals while taking diabetic medications Bring log of blood sugars to PCP Keep up the good work of eating well and exercising!   12/31/16 1659   12/31/16 0000  Diet Carb Modified     12/31/16 1659     Follow-up Information    [name], MD Follow up in 1 week(s).   Specialty:  Internal Medicine Why:  bring log of blood sugars Contact information: [address] [place] [address] [phone]            Time coordinating discharge: 25 min  Signed:  JESSICA U VANN   [facility] 12/31/2016, 5:00 PM

## 2016-12-31 NOTE — Discharge Instructions (Signed)
Follow with Nolene Ebbs, MD in 5-7 days  Please get a complete blood count and chemistry panel checked by your Primary MD at your next visit, and again as instructed by your Primary MD. Please get your medications reviewed and adjusted by your Primary MD.  Please request your Primary MD to go over all Hospital Tests and Procedure/Radiological results at the follow up, please get all Hospital records sent to your Prim MD by signing hospital release before you go home.  If you had Pneumonia of Lung problems at the Hospital: Please get a 2 view Chest X ray done in 6-8 weeks after hospital discharge or sooner if instructed by your Primary MD.  If you have Congestive Heart Failure: Please call your Cardiologist or Primary MD anytime you have any of the following symptoms:  1) 3 pound weight gain in 24 hours or 5 pounds in 1 week  2) shortness of breath, with or without a dry hacking cough  3) swelling in the hands, feet or stomach  4) if you have to sleep on extra pillows at night in order to breathe  Follow cardiac low salt diet and 1.5 lit/day fluid restriction.  If you have diabetes Accuchecks 4 times/day, Once in AM empty stomach and then before each meal. Log in all results and show them to your primary doctor at your next visit. If any glucose reading is under 80 or above 300 call your primary MD immediately.  If you have Seizure/Convulsions/Epilepsy: Please do not drive, operate heavy machinery, participate in activities at heights or participate in high speed sports until you have seen by Primary MD or a Neurologist and advised to do so again.  If you had Gastrointestinal Bleeding: Please ask your Primary MD to check a complete blood count within one week of discharge or at your next visit. Your endoscopic/colonoscopic biopsies that are pending at the time of discharge, will also need to followed by your Primary MD.  Get Medicines reviewed and adjusted. Please take all your  medications with you for your next visit with your Primary MD  Please request your Primary MD to go over all hospital tests and procedure/radiological results at the follow up, please ask your Primary MD to get all Hospital records sent to his/her office.  If you experience worsening of your admission symptoms, develop shortness of breath, life threatening emergency, suicidal or homicidal thoughts you must seek medical attention immediately by calling 911 or calling your MD immediately  if symptoms less severe.  You must read complete instructions/literature along with all the possible adverse reactions/side effects for all the Medicines you take and that have been prescribed to you. Take any new Medicines after you have completely understood and accpet all the possible adverse reactions/side effects.   Do not drive or operate heavy machinery when taking Pain medications.   Do not take more than prescribed Pain, Sleep and Anxiety Medications  Special Instructions: If you have smoked or chewed Tobacco  in the last 2 yrs please stop smoking, stop any regular Alcohol  and or any Recreational drug use.  Wear Seat belts while driving.  Please note You were cared for by a hospitalist during your hospital stay. If you have any questions about your discharge medications or the care you received while you were in the hospital after you are discharged, you can call the unit and asked to speak with the hospitalist on call if the hospitalist that took care of you is not available. Once  you are discharged, your primary care physician will handle any further medical issues. Please note that NO REFILLS for any discharge medications will be authorized once you are discharged, as it is imperative that you return to your primary care physician (or establish a relationship with a primary care physician if you do not have one) for your aftercare needs so that they can reassess your need for medications and monitor your  lab values.  You can reach the hospitalist office at phone [phone] or fax [phone]   If you do not have a primary care physician, you can call [phone] for a physician referral.  Activity: As tolerated with Full fall precautions use walker/cane & assistance as needed

## 2017-01-08 ENCOUNTER — Other Ambulatory Visit: Payer: Self-pay | Admitting: Cardiovascular Disease

## 2017-01-08 ENCOUNTER — Ambulatory Visit: Payer: Self-pay | Admitting: Cardiology

## 2017-01-21 ENCOUNTER — Other Ambulatory Visit (HOSPITAL_COMMUNITY): Payer: Self-pay | Admitting: Physician Assistant

## 2017-01-21 DIAGNOSIS — Z79899 Other long term (current) drug therapy: Secondary | ICD-10-CM

## 2017-01-24 ENCOUNTER — Ambulatory Visit (HOSPITAL_COMMUNITY)
Admission: RE | Admit: 2017-01-24 | Discharge: 2017-01-24 | Disposition: A | Discharge status: Home / Self Care | Payer: Medicaid Other | Source: Ambulatory Visit | Attending: Physician Assistant | Admitting: Physician Assistant

## 2017-01-24 DIAGNOSIS — Z79899 Other long term (current) drug therapy: Secondary | ICD-10-CM | POA: Insufficient documentation

## 2017-01-24 DIAGNOSIS — M5136 Other intervertebral disc degeneration, lumbar region: Secondary | ICD-10-CM | POA: Diagnosis not present

## 2017-01-24 DIAGNOSIS — M1288 Other specific arthropathies, not elsewhere classified, other specified site: Secondary | ICD-10-CM | POA: Diagnosis not present

## 2017-01-24 DIAGNOSIS — M48061 Spinal stenosis, lumbar region without neurogenic claudication: Secondary | ICD-10-CM | POA: Insufficient documentation

## 2017-01-29 ENCOUNTER — Ambulatory Visit (INDEPENDENT_AMBULATORY_CARE_PROVIDER_SITE_OTHER): Payer: Medicaid Other | Admitting: Cardiology

## 2017-01-29 ENCOUNTER — Encounter: Payer: Self-pay | Admitting: Cardiology

## 2017-01-29 DIAGNOSIS — M549 Dorsalgia, unspecified: Secondary | ICD-10-CM

## 2017-01-29 DIAGNOSIS — G8929 Other chronic pain: Secondary | ICD-10-CM

## 2017-01-29 DIAGNOSIS — E162 Hypoglycemia, unspecified: Secondary | ICD-10-CM | POA: Diagnosis not present

## 2017-01-29 DIAGNOSIS — Z8673 Personal history of transient ischemic attack (TIA), and cerebral infarction without residual deficits: Secondary | ICD-10-CM | POA: Diagnosis not present

## 2017-01-29 DIAGNOSIS — Z794 Long term (current) use of insulin: Secondary | ICD-10-CM

## 2017-01-29 DIAGNOSIS — Z720 Tobacco use: Secondary | ICD-10-CM | POA: Diagnosis not present

## 2017-01-29 DIAGNOSIS — E119 Type 2 diabetes mellitus without complications: Secondary | ICD-10-CM | POA: Diagnosis not present

## 2017-01-29 DIAGNOSIS — I2510 Atherosclerotic heart disease of native coronary artery without angina pectoris: Secondary | ICD-10-CM

## 2017-01-29 DIAGNOSIS — Z9861 Coronary angioplasty status: Secondary | ICD-10-CM

## 2017-01-29 DIAGNOSIS — IMO0001 Reserved for inherently not codable concepts without codable children: Secondary | ICD-10-CM

## 2017-01-29 DIAGNOSIS — I10 Essential (primary) hypertension: Secondary | ICD-10-CM

## 2017-01-29 DIAGNOSIS — M5440 Lumbago with sciatica, unspecified side: Secondary | ICD-10-CM | POA: Diagnosis not present

## 2017-01-29 MED ORDER — AMLODIPINE BESYLATE 10 MG PO TABS: 10.0000 mg | ORAL_TABLET | Freq: Every day | ORAL | 6 refills | Status: DC

## 2017-01-29 MED ORDER — AMLODIPINE BESYLATE 10 MG PO TABS
10.0000 mg | ORAL_TABLET | Freq: Every day | ORAL | 6 refills | Status: DC
Start: 2017-01-29 — End: 2018-02-11

## 2017-01-29 NOTE — Assessment & Plan Note (Signed)
CFX DES Aug 2017, residual 60% dLAD-med Rx No chest pain complaints

## 2017-01-29 NOTE — Assessment & Plan Note (Signed)
Old Lt brain stroke on CT

## 2017-01-29 NOTE — Progress Notes (Signed)
01/29/2017 Miranda [name]   1955-09-11  [id number]  Primary Physician [name], MD Primary Cardiologist: Dr Allyson Sabal  HPI:  61 y/o AA female with a history of CAD, s/p CFX DES Aug 2017. She has residual 60% LAD treated medically. Her other problems include IDDM, HTN, HLD, and chronic back pain. She was brought to the ER 12/30/16 after patient was found to be confused with hypoglycemia. Patient had gone to [employer] and while in the drive-through had near syncope. The staff at the restaurant called EMS and patient she was found to be hypoglycemic with BS in the 30s. She was admitted and her medications adjusted. In addition to adjusting her Insulin her HCTZ and Norvasc were stopped secondary to hypotension. Since then she has noted increasing B/P at home. She denies chest pain and has chronic DOE. Her main complaint is her chronic low back pain. LS MRI 01/24/17 showed no change from 2009 with "mild DJD".    Current Outpatient Prescriptions  Medication Sig Dispense Refill  . ACCU-CHEK AVIVA PLUS test strip 1 each as needed.  5  . albuterol (PROVENTIL HFA;VENTOLIN HFA) 108 (90 Base) MCG/ACT inhaler Inhale 2 puffs into the lungs every 6 (six) hours as needed for wheezing or shortness of breath.    [name] aspirin 81 MG chewable tablet Chew 1 tablet (81 mg total) by mouth daily.    [name] atorvastatin (LIPITOR) 80 MG tablet TAKE 1 TABLET EVERY DAY AT 6 PM 30 tablet 6  . B-D ULTRAFINE III SHORT PEN 31G X 8 MM MISC 1 each as needed.  11  . BRILINTA 90 MG TABS tablet TAKE 1 TABLET BY MOUTH TWICE A DAY 60 tablet 10  . buPROPion (WELLBUTRIN SR) 150 MG 12 hr tablet Take 150 mg by mouth daily.  5  . carvedilol (COREG) 12.5 MG tablet Take 1 tablet (12.5 mg total) by mouth 2 (two) times daily with a meal. 60 tablet 11  . cloNIDine (CATAPRES) 0.2 MG tablet Take 0.2 mg by mouth daily as needed.     . Cyanocobalamin (B-12) 1000 MCG CAPS Take 1 tablet by mouth daily.    [name] gabapentin (NEURONTIN) 300 MG capsule  Take 300 mg by mouth 3 (three) times daily.     . insulin glargine (LANTUS) 100 UNIT/ML injection Inject 0.2 mLs (20 Units total) into the skin 2 (two) times daily. 10 mL 11  . lisinopril (PRINIVIL,ZESTRIL) 20 MG tablet Take 1 tablet (20 mg total) by mouth at bedtime. Restart tomorrow 40 tablet 0  . metFORMIN (GLUMETZA) 1000 MG (MOD) 24 hr tablet Take 1 tablet (1,000 mg total) by mouth 2 (two) times daily with a meal. Restart tomorrow    . MOVANTIK 25 MG TABS tablet Take 25 mg by mouth every morning.  5  . Omega-3 Fatty Acids (FISH OIL) 1000 MG CAPS Take 1 capsule by mouth daily as needed (for vitamin).     [name] oxyCODONE (OXY IR/ROXICODONE) 5 MG immediate release tablet Take 5 mg by mouth 3 (three) times daily as needed (pain).     . saxagliptin HCl (ONGLYZA) 2.5 MG TABS tablet Take 5 mg by mouth every morning.    . traZODone (DESYREL) 50 MG tablet Take 50 mg by mouth at bedtime as needed for sleep.     . Vitamin D, Ergocalciferol, (DRISDOL) 50000 units CAPS capsule Take 50,000 Units by mouth every 7 (seven) days. monday    . amLODipine (NORVASC) 10 MG tablet Take 1 tablet (10 mg total)  by mouth daily. 30 tablet 6   No current facility-administered medications for this visit.     No Known Allergies  Past Medical History:  Diagnosis Date  . Acid reflux   . Anxiety   . Asthma   . Degenerative disc disease   . Depression   . Diabetes mellitus   . Hypertension   . Hypoglycemia 12/30/2016  . Stroke [facility](HCC)    04/22/12    Social History   Social History  . Marital status: Divorced    Spouse name: N/A  . Number of children: N/A  . Years of education: N/A   Occupational History  . Not on file.   Social History Main Topics  . Smoking status: Current Every Day Smoker    Packs/day: 0.25    Years: 45.00    Types: Cigarettes  . Smokeless tobacco: Never Used  . Alcohol use No  . Drug use: No  . Sexual activity: No   Other Topics Concern  . Not on file   Social History Narrative  .  No narrative on file     Family History  Problem Relation Age of Onset  . Hypertension Other      Review of Systems: General: negative for chills, fever, night sweats or weight changes.  Cardiovascular: negative for chest pain, dyspnea on exertion, edema, orthopnea, palpitations, paroxysmal nocturnal dyspnea or shortness of breath Dermatological: negative for rash Respiratory: negative for cough or wheezing Urologic: negative for hematuria Abdominal: negative for nausea, vomiting, diarrhea, bright red blood per rectum, melena, or hematemesis Neurologic: negative for visual changes, syncope, or dizziness All other systems reviewed and are otherwise negative except as noted above.    Blood pressure (!) 156/84, pulse 64, height 5\' 10"  (1.778 m), weight 238 lb (108 kg).  General appearance: alert, cooperative, no distress and mildly obese Neck: no carotid bruit and no JVD Lungs: clear to auscultation bilaterally Heart: regular rate and rhythm Extremities: extremities normal, atraumatic, no cyanosis or edema Skin: Skin color, texture, turgor normal. No rashes or lesions Neurologic: Grossly normal  EKG NSR, incomplete RBBB  ASSESSMENT AND PLAN:   CAD S/P percutaneous coronary angioplasty CFX DES Aug 2017, residual 60% dLAD-med Rx No chest pain complaints  Essential hypertension B/P elevated-164/80 by me- resume Amlodipine 10 mg  Chronic back pain Pt tells me her pain clinic closed. She is getting established with another clinic  Insulin dependent diabetes mellitus (HCC) Pt recently admitted with hypoglycemia-12/30/16  History of stroke Old Lt brain stroke on CT   PLAN  Resume Amlodipine 10 mg. F/U B/P in two weeks, consider resuming HCTZ then if needed.   Corine ShelterLuke Conal Shetley PA-C 01/29/2017 11:07 AM

## 2017-01-29 NOTE — Assessment & Plan Note (Signed)
Pt tells me her pain clinic closed. She is getting established with another clinic

## 2017-01-29 NOTE — Assessment & Plan Note (Addendum)
B/P elevated-164/80 by me- resume Amlodipine 10 mg

## 2017-01-29 NOTE — Patient Instructions (Addendum)
Medication Instructions:  RESUME Norvasc 10 mg take 1 tablet daily every morning   Labwork: None   Testing/Procedures: None   Follow-Up: Your physician wants you to follow-up in: 6 months with Dr Allyson SabalBerry. You will receive a reminder letter in the mail two months in advance. If you don't receive a letter, please call our office to schedule the follow-up appointment.  Your physician recommends that you schedule a follow-up appointment in: 2 wk Blood Pressure check with Pharm D   Any Other Special Instructions Will Be Listed Below (If Applicable).  If you need a refill on your cardiac medications before your next appointment, please call your pharmacy.

## 2017-01-29 NOTE — Assessment & Plan Note (Signed)
Pt recently admitted with hypoglycemia-12/30/16

## 2017-02-06 ENCOUNTER — Ambulatory Visit: Payer: Self-pay | Admitting: Podiatry

## 2017-02-12 ENCOUNTER — Other Ambulatory Visit: Payer: Self-pay | Admitting: Cardiovascular Disease

## 2017-02-15 NOTE — Progress Notes (Deleted)
Patient ID: Miranda [name]                 DOB: 1955-08-30                      MRN: [id number]     HPI: Miranda ButtersChandra D [name] is a 61 y.o. female patient of Dr. Allyson SabalBerry referred by Corine ShelterLuke Kilroy, PA-C to HTN clinic. PMH is significant for CAD s/p DES (2017), DM, HTN, HLD, and chronic back pain. Pt was recently admitted 12/2016 with hypoglycemia and her HCTZ and amlodipine were stopped at discharge 2/2 hypotension during admit, however BP readings at home have begun increasing. Pt seen by cardiology in clinic 10/18 and BP was 164/80 so amlodipine resumed.  Current HTN meds: amlodipine 10mg  daily, carvedilol 12.5mg  BID, lisinopril 20mg  daily, clonidine 0.2mg  daily prn (***)  Previously tried: HCTZ 25mg  daily (hypotension during hospital admit)  BP goal: <130/80 mmHg  Family History: HTN (***)  Social History: 0.25 pack/day smoker, denies alcohol or illicit drug use  Diet: ***  Exercise: ***  Home BP readings: ***  Wt Readings from Last 3 Encounters:  01/29/17 238 lb (108 kg)  12/31/16 234 lb 11.2 oz (106.5 kg)  06/10/16 236 lb 9.6 oz (107.3 kg)   BP Readings from Last 3 Encounters:  01/29/17 (!) 156/84  12/31/16 (!) 160/73  06/10/16 137/74   Pulse Readings from Last 3 Encounters:  01/29/17 64  12/31/16 65  06/10/16 68    Renal function: CrCl cannot be calculated (Patient's most recent lab result is older than the maximum 21 days allowed.).  Past Medical History:  Diagnosis Date  . Acid reflux   . Anxiety   . Asthma   . Degenerative disc disease   . Depression   . Diabetes mellitus   . Hypertension   . Hypoglycemia 12/30/2016  . Stroke [facility](HCC)    04/22/12    Current Outpatient Medications on File Prior to Visit  Medication Sig Dispense Refill  . ACCU-CHEK AVIVA PLUS test strip 1 each as needed.  5  . albuterol (PROVENTIL HFA;VENTOLIN HFA) 108 (90 Base) MCG/ACT inhaler Inhale 2 puffs into the lungs every 6 (six) hours as needed for wheezing or shortness of breath.    [name].  amLODipine (NORVASC) 10 MG tablet Take 1 tablet (10 mg total) by mouth daily. 30 tablet 6  . aspirin 81 MG chewable tablet Chew 1 tablet (81 mg total) by mouth daily.    [name]. atorvastatin (LIPITOR) 80 MG tablet TAKE 1 TABLET EVERY DAY AT 6 PM 30 tablet 6  . B-D ULTRAFINE III SHORT PEN 31G X 8 MM MISC 1 each as needed.  11  . BRILINTA 90 MG TABS tablet TAKE 1 TABLET BY MOUTH TWICE A DAY 60 tablet 10  . buPROPion (WELLBUTRIN SR) 150 MG 12 hr tablet Take 150 mg by mouth daily.  5  . carvedilol (COREG) 12.5 MG tablet Take 1 tablet (12.5 mg total) by mouth 2 (two) times daily with a meal. 60 tablet 11  . cloNIDine (CATAPRES) 0.2 MG tablet Take 0.2 mg by mouth daily as needed.     . Cyanocobalamin (B-12) 1000 MCG CAPS Take 1 tablet by mouth daily.    [name]. gabapentin (NEURONTIN) 300 MG capsule Take 300 mg by mouth 3 (three) times daily.     . insulin glargine (LANTUS) 100 UNIT/ML injection Inject 0.2 mLs (20 Units total) into the skin 2 (two) times daily. 10 mL 11  .  lisinopril (PRINIVIL,ZESTRIL) 20 MG tablet Take 1 tablet (20 mg total) by mouth at bedtime. Restart tomorrow 40 tablet 0  . metFORMIN (GLUMETZA) 1000 MG (MOD) 24 hr tablet Take 1 tablet (1,000 mg total) by mouth 2 (two) times daily with a meal. Restart tomorrow    . MOVANTIK 25 MG TABS tablet Take 25 mg by mouth every morning.  5  . Omega-3 Fatty Acids (FISH OIL) 1000 MG CAPS Take 1 capsule by mouth daily as needed (for vitamin).     [name] oxyCODONE (OXY IR/ROXICODONE) 5 MG immediate release tablet Take 5 mg by mouth 3 (three) times daily as needed (pain).     . saxagliptin HCl (ONGLYZA) 2.5 MG TABS tablet Take 5 mg by mouth every morning.    . traZODone (DESYREL) 50 MG tablet Take 50 mg by mouth at bedtime as needed for sleep.     . Vitamin D, Ergocalciferol, (DRISDOL) 50000 units CAPS capsule Take 50,000 Units by mouth every 7 (seven) days. monday     No current facility-administered medications on file prior to visit.     No Known  Allergies   Assessment/Plan:  1. Hypertension -

## 2017-02-16 ENCOUNTER — Ambulatory Visit: Payer: Medicaid Other

## 2017-03-03 ENCOUNTER — Encounter: Payer: Self-pay | Admitting: Podiatry

## 2017-03-03 ENCOUNTER — Ambulatory Visit: Payer: Medicaid Other | Admitting: Podiatry

## 2017-03-03 VITALS — BP 163/89 | HR 68

## 2017-03-03 DIAGNOSIS — M79676 Pain in unspecified toe(s): Secondary | ICD-10-CM

## 2017-03-03 DIAGNOSIS — B351 Tinea unguium: Secondary | ICD-10-CM

## 2017-03-03 DIAGNOSIS — M79674 Pain in right toe(s): Secondary | ICD-10-CM

## 2017-03-03 DIAGNOSIS — E1142 Type 2 diabetes mellitus with diabetic polyneuropathy: Secondary | ICD-10-CM

## 2017-03-03 DIAGNOSIS — E1151 Type 2 diabetes mellitus with diabetic peripheral angiopathy without gangrene: Secondary | ICD-10-CM

## 2017-03-03 NOTE — Progress Notes (Signed)
   Subjective:    Patient ID: Miranda ButtersChandra D Dekay, female    DOB: 04-25-1955, 61 y.o.   MRN: [id number]  HPI   This patient presents today requesting a general diabetic foot screen and complaining of uncomfortable right hallux toenail. The nails been uncomfortable overall multiple mobile. A time and patient describes previous removal with recurrence of deformity nail. Patient is unable trim the nail herself Patient is diabetic and denies history of claudication, amputation Patient complains of burning, tingling, numbness on and off weightbearing right and left feet Patient is a current smoker    Review of Systems  All other systems reviewed and are negative.      Objective:   Physical Exam  Patient estimates her height is 5 feet 10 inches and weighing 230 pounds  Objective: Orientated 3  Vascular: DP pulses 2/4 bilaterally PT pulses 1/4 bilaterally Reflex within normal limits bilaterally  Neurological: Sensation to 10 g monofilament wire intact 8/8 bilaterally Vibratory sensation reactive bilaterally Ankle reflexes reactive bilaterally  Dermatological: No open skin lesions bilaterally Hypertrophic deformed right hallux toenail with subungual debris tenderness Dry skin plantarly bilaterally HAV right There is no pain or crepitus on range of motion ankle, subtalar, midtarsal joints bilaterally       Assessment & Plan:   Assessment: Diabetic peripheral neuropathy Decrease pedal pulses suggestive possible peripheral arterial disease Symptomatic mycotic right hallux toenail  Plan: Debride right hallux toenail mechanically electronically with slight bleeding. Apply topical antibiotic ointment and Band-Aid to right hallux with instructions for patient to continue applying topical antibiotic ointment and Band-Aid until a scab forms  Reappoint when necessary at patient's request or yearly

## 2017-03-03 NOTE — Patient Instructions (Signed)
Apply triple antibiotic ointment to the right big toe daily until a scab forms and cover with a Band-Aid  Diabetes and Foot Care Diabetes may cause you to have problems because of poor blood supply (circulation) to your feet and legs. This may cause the skin on your feet to become thinner, break easier, and heal more slowly. Your skin may become dry, and the skin may peel and crack. You may also have nerve damage in your legs and feet causing decreased feeling in them. You may not notice minor injuries to your feet that could lead to infections or more serious problems. Taking care of your feet is one of the most important things you can do for yourself. Follow these instructions at home:  Wear shoes at all times, even in the house. Do not go barefoot. Bare feet are easily injured.  Check your feet daily for blisters, cuts, and redness. If you cannot see the bottom of your feet, use a mirror or ask someone for help.  Wash your feet with warm water (do not use hot water) and mild soap. Then pat your feet and the areas between your toes until they are completely dry. Do not soak your feet as this can dry your skin.  Apply a moisturizing lotion or petroleum jelly (that does not contain alcohol and is unscented) to the skin on your feet and to dry, brittle toenails. Do not apply lotion between your toes.  Trim your toenails straight across. Do not dig under them or around the cuticle. File the edges of your nails with an emery board or nail file.  Do not cut corns or calluses or try to remove them with medicine.  Wear clean socks or stockings every day. Make sure they are not too tight. Do not wear knee-high stockings since they may decrease blood flow to your legs.  Wear shoes that fit properly and have enough cushioning. To break in new shoes, wear them for just a few hours a day. This prevents you from injuring your feet. Always look in your shoes before you put them on to be sure there are no  objects inside.  Do not cross your legs. This may decrease the blood flow to your feet.  If you find a minor scrape, cut, or break in the skin on your feet, keep it and the skin around it clean and dry. These areas may be cleansed with mild soap and water. Do not cleanse the area with peroxide, alcohol, or iodine.  When you remove an adhesive bandage, be sure not to damage the skin around it.  If you have a wound, look at it several times a day to make sure it is healing.  Do not use heating pads or hot water bottles. They may burn your skin. If you have lost feeling in your feet or legs, you may not know it is happening until it is too late.  Make sure your health care provider performs a complete foot exam at least annually or more often if you have foot problems. Report any cuts, sores, or bruises to your health care provider immediately. Contact a health care provider if:  You have an injury that is not healing.  You have cuts or breaks in the skin.  You have an ingrown nail.  You notice redness on your legs or feet.  You feel burning or tingling in your legs or feet.  You have pain or cramps in your legs and feet.  Your  legs or feet are numb.  Your feet always feel cold. Get help right away if:  There is increasing redness, swelling, or pain in or around a wound.  There is a red line that goes up your leg.  Pus is coming from a wound.  You develop a fever or as directed by your health care provider.  You notice a bad smell coming from an ulcer or wound. This information is not intended to replace advice given to you by your health care provider. Make sure you discuss any questions you have with your health care provider. Document Released: 03/28/2000 Document Revised: 09/06/2015 Document Reviewed: 09/07/2012 Elsevier Interactive Patient Education  2017 [employer].

## 2017-03-08 NOTE — Progress Notes (Deleted)
Patient ID: Miranda [name]                 DOB: 1955-06-10                      MRN: [id number]     HPI: Miranda [name] is a 61 y.o. female patient of Dr. Allyson SabalBerry referred by Corine ShelterLuke Kilroy, PA to HTN clinic. PMH is significant for CAD, s/p CFX DES 11/2015 (residual 60% dLAD-med Rx), HTN, Stroke, TIA, NSTEMI, HLD, DM, and chronic back pain (mild DDD). Patient had an ED visit on 12/30/16 for confusion due to hypoglycemia with a BG in the 30s. She was admitted at this time and her insulin was adjusted, her amlodipine and HCTZ were also stopped at this time due to hypotension. Amlodipine 10mg  daily was restarted on 01/29/17 by Corine ShelterLuke Kilroy, PA.  Patient presents to clinic in good spirits today. Denies headache, dizziness, lightheadedness, or falls.  Current HTN meds: amlodipine 10mg  daily, carvedilol 12.5mg  BID, clonidine 0.2mg  daily PRN, lisinopril 20mg  QHS Previously tried: HCTZ 25mg  daily (stopped while inpatient due to hypotension) BP goal: < 140/8390mmHg (optimal < 130/80 given DM hx)  Family History: Mother died in her 1050's from MI, post CABG x3, sister still living post MI with stenting.  Social History: Current smoker, 0.25PPD x 45 years. Denies alcohol or illicit drug use.  Diet:  Breakfast: Lunch: Dinner: *** add salt to food. *** cups of coffee per day.   Exercise:   Home BP readings:   Wt Readings from Last 3 Encounters:  01/29/17 238 lb (108 kg)  12/31/16 234 lb 11.2 oz (106.5 kg)  06/10/16 236 lb 9.6 oz (107.3 kg)   BP Readings from Last 3 Encounters:  03/03/17 (!) 163/89  01/29/17 (!) 156/84  12/31/16 (!) 160/73   Pulse Readings from Last 3 Encounters:  03/03/17 68  01/29/17 64  12/31/16 65    Renal function: CrCl cannot be calculated (Patient's most recent lab result is older than the maximum 21 days allowed.).  Past Medical History:  Diagnosis Date  . Acid reflux   . Anxiety   . Asthma   . Degenerative disc disease   . Depression   . Diabetes mellitus   .  Hypertension   . Hypoglycemia 12/30/2016  . Stroke [facility](HCC)    04/22/12    Current Outpatient Medications on File Prior to Visit  Medication Sig Dispense Refill  . ACCU-CHEK AVIVA PLUS test strip 1 each as needed.  5  . albuterol (PROVENTIL HFA;VENTOLIN HFA) 108 (90 Base) MCG/ACT inhaler Inhale 2 puffs into the lungs every 6 (six) hours as needed for wheezing or shortness of breath.    [name]. amLODipine (NORVASC) 10 MG tablet Take 1 tablet (10 mg total) by mouth daily. 30 tablet 6  . aspirin 81 MG chewable tablet Chew 1 tablet (81 mg total) by mouth daily.    [name]. atorvastatin (LIPITOR) 80 MG tablet TAKE 1 TABLET EVERY DAY AT 6 PM 30 tablet 6  . B-D ULTRAFINE III SHORT PEN 31G X 8 MM MISC 1 each as needed.  11  . BRILINTA 90 MG TABS tablet TAKE 1 TABLET BY MOUTH TWICE A DAY 60 tablet 10  . buPROPion (WELLBUTRIN SR) 150 MG 12 hr tablet Take 150 mg by mouth daily.  5  . carvedilol (COREG) 12.5 MG tablet Take 1 tablet (12.5 mg total) by mouth 2 (two) times daily with a meal. 60 tablet 11  . cloNIDine (  CATAPRES) 0.2 MG tablet Take 0.2 mg by mouth daily as needed.     . Cyanocobalamin (B-12) 1000 MCG CAPS Take 1 tablet by mouth daily.    [name]. gabapentin (NEURONTIN) 300 MG capsule Take 300 mg by mouth 3 (three) times daily.     . insulin glargine (LANTUS) 100 UNIT/ML injection Inject 0.2 mLs (20 Units total) into the skin 2 (two) times daily. 10 mL 11  . lisinopril (PRINIVIL,ZESTRIL) 20 MG tablet Take 1 tablet (20 mg total) by mouth at bedtime. Restart tomorrow 40 tablet 0  . metFORMIN (GLUMETZA) 1000 MG (MOD) 24 hr tablet Take 1 tablet (1,000 mg total) by mouth 2 (two) times daily with a meal. Restart tomorrow    . MOVANTIK 25 MG TABS tablet Take 25 mg by mouth every morning.  5  . Omega-3 Fatty Acids (FISH OIL) 1000 MG CAPS Take 1 capsule by mouth daily as needed (for vitamin).     [name]. oxyCODONE (OXY IR/ROXICODONE) 5 MG immediate release tablet Take 5 mg by mouth 3 (three) times daily as needed (pain).     .  saxagliptin HCl (ONGLYZA) 2.5 MG TABS tablet Take 5 mg by mouth every morning.    . traZODone (DESYREL) 50 MG tablet Take 50 mg by mouth at bedtime as needed for sleep.     . Vitamin D, Ergocalciferol, (DRISDOL) 50000 units CAPS capsule Take 50,000 Units by mouth every 7 (seven) days. monday     No current facility-administered medications on file prior to visit.     No Known Allergies   Assessment/Plan:  1. Hypertension - Patient's BP is ***, which is *** her goal of < 140/90 mmHg. Increase lisinopril 40mg  daily or restart HCTZ 12.5mg  daily. Continue amlodipine, carvedilol, and clonidine. Follow-up in HTN clinic and BMET in 4 weeks.

## 2017-03-09 ENCOUNTER — Ambulatory Visit: Payer: Medicaid Other | Admitting: Pharmacist

## 2017-03-12 ENCOUNTER — Ambulatory Visit: Payer: Medicaid Other | Admitting: Obstetrics and Gynecology

## 2017-03-24 ENCOUNTER — Other Ambulatory Visit (HOSPITAL_COMMUNITY): Payer: Self-pay | Admitting: Physician Assistant

## 2017-03-24 DIAGNOSIS — G8929 Other chronic pain: Secondary | ICD-10-CM

## 2017-03-24 DIAGNOSIS — M542 Cervicalgia: Principal | ICD-10-CM

## 2017-03-27 ENCOUNTER — Ambulatory Visit (HOSPITAL_COMMUNITY): Admission: RE | Admit: 2017-03-27 | Payer: Medicaid Other | Source: Ambulatory Visit

## 2017-05-14 ENCOUNTER — Ambulatory Visit: Payer: Medicaid Other | Admitting: Pharmacist

## 2017-05-14 VITALS — BP 148/62 | HR 77

## 2017-05-14 DIAGNOSIS — I10 Essential (primary) hypertension: Secondary | ICD-10-CM | POA: Diagnosis not present

## 2017-05-14 MED ORDER — LISINOPRIL 40 MG PO TABS: 40.0000 mg | ORAL_TABLET | Freq: Every day | ORAL | 1 refills | Status: DC

## 2017-05-14 MED ORDER — NICOTINE 14 MG/24HR TD PT24: 14.0000 mg | MEDICATED_PATCH | Freq: Every day | TRANSDERMAL | 0 refills | Status: DC

## 2017-05-14 MED ORDER — NICOTINE POLACRILEX 2 MG MT GUM: 2.0000 mg | CHEWING_GUM | OROMUCOSAL | 0 refills | Status: DC | PRN

## 2017-05-14 NOTE — Progress Notes (Signed)
Patient ID: Miranda ButtersChandra D Asato                 DOB: 1955-12-09                      MRN: [id number]     HPI: Miranda [name] is a 62 y.o. female patient of Dr. Allyson SabalBerry who presents today for hypertension follow up. PMH significant for HTN, Stroke, TIA, NSTEMI, DM, HLD. She has previously been seen in HTN clinic with titration of carvedilol. She was admitted for somnolence (HCTZ and amlodipine were stopped due to hypotension) and followed up with Corine ShelterLuke Kilroy, PA at which time her pressure was elevated. Her amlodipine was resumed at that visit.   She presents today for additional bp management. She denies dizziness, SOB, and chest pain. She denies headaches when pressures are high. She takes clonidine when her pressures are 160s or higher. She reports that her pressures normally do come down after clonidine. She smoked her last cigarette this morning - she smoked "probably a couple." She reports that she was planning on trying the patch but was afraid because the RX was sent for the highest dose but she does not smoke the amount needed for this patch.   Current HTN meds:  Amlodipine 10mg  daily in the morning Carvedilol 12.5mg  BID (9am and 9pm) Clonidine 0.2mg  PRN - last taken a few nights ago (she generally uses once a week) Lisinopril 20mg  daily at bedtime  Previously tried: HCTZ - stopped due to hypotension   BP goal: <130/80  Family History: She states her mother and sister both have a history of cardiac disease.   Social History: current smoker (5 cigarettes/ day) down from 1/2 - 1 ppd. She denies smokeless tobacco and alcohol.   Diet: She eats a fair amount of her meals out and admits to a lot of fast food. She states she has been trying to make improvements in her diet recently. She has decreased her fried food consumption. She denies adding salt to food. She drinks 1 cup of coffee per day and 1-2 glasses of green tea.   Exercise: She has tried to walk for about 10 minutes each  day.  Home BP readings: she reports 130-150/80s or higher -   Wt Readings from Last 3 Encounters:  01/29/17 238 lb (108 kg)  12/31/16 234 lb 11.2 oz (106.5 kg)  06/10/16 236 lb 9.6 oz (107.3 kg)   BP Readings from Last 3 Encounters:  05/14/17 (!) 148/62  03/03/17 (!) 163/89  01/29/17 (!) 156/84   Pulse Readings from Last 3 Encounters:  05/14/17 77  03/03/17 68  01/29/17 64    Renal function: CrCl cannot be calculated (Patient's most recent lab result is older than the maximum 21 days allowed.).  Past Medical History:  Diagnosis Date  . Acid reflux   . Anxiety   . Asthma   . Degenerative disc disease   . Depression   . Diabetes mellitus   . Hypertension   . Hypoglycemia 12/30/2016  . Stroke [facility](HCC)    04/22/12    Current Outpatient Medications on File Prior to Visit  Medication Sig Dispense Refill  . ACCU-CHEK AVIVA PLUS test strip 1 each as needed.  5  . albuterol (PROVENTIL HFA;VENTOLIN HFA) 108 (90 Base) MCG/ACT inhaler Inhale 2 puffs into the lungs every 6 (six) hours as needed for wheezing or shortness of breath.    [name]. amLODipine (NORVASC) 10 MG tablet Take 1 tablet (  10 mg total) by mouth daily. 30 tablet 6  . aspirin 81 MG chewable tablet Chew 1 tablet (81 mg total) by mouth daily.    [name] atorvastatin (LIPITOR) 80 MG tablet TAKE 1 TABLET EVERY DAY AT 6 PM 30 tablet 6  . B-D ULTRAFINE III SHORT PEN 31G X 8 MM MISC 1 each as needed.  11  . BRILINTA 90 MG TABS tablet TAKE 1 TABLET BY MOUTH TWICE A DAY 60 tablet 10  . buPROPion (WELLBUTRIN SR) 150 MG 12 hr tablet Take 150 mg by mouth daily.  5  . carvedilol (COREG) 12.5 MG tablet Take 1 tablet (12.5 mg total) by mouth 2 (two) times daily with a meal. 60 tablet 11  . cloNIDine (CATAPRES) 0.2 MG tablet Take 0.2 mg by mouth daily as needed.     . Cyanocobalamin (B-12) 1000 MCG CAPS Take 1 tablet by mouth daily.    [name] gabapentin (NEURONTIN) 300 MG capsule Take 300 mg by mouth 3 (three) times daily.     . insulin glargine  (LANTUS) 100 UNIT/ML injection Inject 0.2 mLs (20 Units total) into the skin 2 (two) times daily. (Patient taking differently: Inject 20 Units into the skin 2 (two) times daily. Patient takes 50 units in the morning and 40 units at night) 10 mL 11  . metFORMIN (GLUMETZA) 1000 MG (MOD) 24 hr tablet Take 1 tablet (1,000 mg total) by mouth 2 (two) times daily with a meal. Restart tomorrow    . MOVANTIK 25 MG TABS tablet Take 25 mg by mouth every morning.  5  . Omega-3 Fatty Acids (FISH OIL) 1000 MG CAPS Take 1 capsule by mouth daily as needed (for vitamin).     [name] oxyCODONE (OXY IR/ROXICODONE) 5 MG immediate release tablet Take 10 mg by mouth 3 (three) times daily as needed (pain).     . saxagliptin HCl (ONGLYZA) 2.5 MG TABS tablet Take 5 mg by mouth every morning.    . traZODone (DESYREL) 50 MG tablet Take 50 mg by mouth at bedtime as needed for sleep.     . Vitamin D, Ergocalciferol, (DRISDOL) 50000 units CAPS capsule Take 50,000 Units by mouth every 7 (seven) days. monday     No current facility-administered medications on file prior to visit.     No Known Allergies  Blood pressure (!) 148/62, pulse 77.   Assessment/Plan: Hypertension: BP not at goal today. Will increase lisinopril to 40mg  daily. We also discussed need for tobacco cessation. She would like to quit and would like to use patches to do so. She is also concerned about cravings while on the patch. RX sent for patches and gum to help with tobacco cessation. Advised to use patch, but not to smoke while on patch as she has done in the past. Advised that she can use gum for cravings not controlled by patch. She would prefer to follow up with PA in 5 weeks as scheduled rather than sooner as recommended. She will need a BMET that day with the increase of lisinopril. Follow up in HTN clinic after if needed.   Thank you, Freddie Apley. Cleatis Polka, PharmD  [facility]  05/14/2017 12:26 PM

## 2017-05-14 NOTE — Patient Instructions (Signed)
INCREASE lisinopril to 40mg  daily (you make take 2 tablets of your current supply (20mg ) until you run out then pick up higher strength from pharmacy and take 1 tablet (40mg ) daily)  Use the patch to help with smoking cessation. You may use the gum for carvings not controlled by patch.    Steps to Quit Smoking Smoking tobacco can be bad for your health. It can also affect almost every organ in your body. Smoking puts you and people around you at risk for many serious long-lasting (chronic) diseases. Quitting smoking is hard, but it is one of the best things that you can do for your health. It is never too late to quit. What are the benefits of quitting smoking? When you quit smoking, you lower your risk for getting serious diseases and conditions. They can include:  Lung cancer or lung disease.  Heart disease.  Stroke.  Heart attack.  Not being able to have children (infertility).  Weak bones (osteoporosis) and broken bones (fractures).  If you have coughing, wheezing, and shortness of breath, those symptoms may get better when you quit. You may also get sick less often. If you are pregnant, quitting smoking can help to lower your chances of having a baby of low birth weight. What can I do to help me quit smoking? Talk with your doctor about what can help you quit smoking. Some things you can do (strategies) include:  Quitting smoking totally, instead of slowly cutting back how much you smoke over a period of time.  Going to in-person counseling. You are more likely to quit if you go to many counseling sessions.  Using resources and support systems, such as: ? Agricultural engineer with a Veterinary surgeon. ? Phone quitlines. ? Automotive engineer. ? Support groups or group counseling. ? Text messaging programs. ? Mobile phone apps or applications.  Taking medicines. Some of these medicines may have nicotine in them. If you are pregnant or breastfeeding, do not take any medicines to quit  smoking unless your doctor says it is okay. Talk with your doctor about counseling or other things that can help you.  Talk with your doctor about using more than one strategy at the same time, such as taking medicines while you are also going to in-person counseling. This can help make quitting easier. What things can I do to make it easier to quit? Quitting smoking might feel very hard at first, but there is a lot that you can do to make it easier. Take these steps:  Talk to your family and friends. Ask them to support and encourage you.  Call phone quitlines, reach out to support groups, or work with a Veterinary surgeon.  Ask people who smoke to not smoke around you.  Avoid places that make you want (trigger) to smoke, such as: ? Bars. ? Parties. ? Smoke-break areas at work.  Spend time with people who do not smoke.  Lower the stress in your life. Stress can make you want to smoke. Try these things to help your stress: ? Getting regular exercise. ? Deep-breathing exercises. ? Yoga. ? Meditating. ? Doing a body scan. To do this, close your eyes, focus on one area of your body at a time from head to toe, and notice which parts of your body are tense. Try to relax the muscles in those areas.  Download or buy apps on your mobile phone or tablet that can help you stick to your quit plan. There are many free apps, such  as QuitGuide from the [employer] developer(Centers for Disease Control and Prevention). You can find more support from smokefree.gov and other websites.  This information is not intended to replace advice given to you by your health care provider. Make sure you discuss any questions you have with your health care provider. Document Released: 01/25/2009 Document Revised: 11/27/2015 Document Reviewed: 08/15/2014 Elsevier Interactive Patient Education  2018 [employer]    Coping with Quitting Smoking Quitting smoking is a physical and mental challenge. You will face cravings, withdrawal  symptoms, and temptation. Before quitting, work with your health care provider to make a plan that can help you cope. Preparation can help you quit and keep you from giving in. How can I cope with cravings? Cravings usually last for 5-10 minutes. If you get through it, the craving will pass. Consider taking the following actions to help you cope with cravings:  Keep your mouth busy: ? Chew sugar-free gum. ? Suck on hard candies or a straw. ? Brush your teeth.  Keep your hands and body busy: ? Immediately change to a different activity when you feel a craving. ? Squeeze or play with a ball. ? Do an activity or a hobby, like making bead jewelry, practicing needlepoint, or working with wood. ? Mix up your normal routine. ? Take a short exercise break. Go for a quick walk or run up and down stairs. ? Spend time in public places where smoking is not allowed.  Focus on doing something kind or helpful for someone else.  Call a friend or family member to talk during a craving.  Join a support group.  Call a quit line, such as 1-800-QUIT-NOW.  Talk with your health care provider about medicines that might help you cope with cravings and make quitting easier for you.  How can I deal with withdrawal symptoms? Your body may experience negative effects as it tries to get used to not having nicotine in the system. These effects are called withdrawal symptoms. They may include:  Feeling hungrier than normal.  Trouble concentrating.  Irritability.  Trouble sleeping.  Feeling depressed.  Restlessness and agitation.  Craving a cigarette.  To manage withdrawal symptoms:  Avoid places, people, and activities that trigger your cravings.  Remember why you want to quit.  Get plenty of sleep.  Avoid coffee and other caffeinated drinks. These may worsen some of your symptoms.  How can I handle social situations? Social situations can be difficult when you are quitting smoking,  especially in the first few weeks. To manage this, you can:  Avoid parties, bars, and other social situations where people might be smoking.  Avoid alcohol.  Leave right away if you have the urge to smoke.  Explain to your family and friends that you are quitting smoking. Ask for understanding and support.  Plan activities with friends or family where smoking is not an option.  What are some ways I can cope with stress? Wanting to smoke may cause stress, and stress can make you want to smoke. Find ways to manage your stress. Relaxation techniques can help. For example:  Breathe slowly and deeply, in through your nose and out through your mouth.  Listen to soothing, relaxing music.  Talk with a family member or friend about your stress.  Light a candle.  Soak in a bath or take a shower.  Think about a peaceful place.  What are some ways I can prevent weight gain? Be aware that many people gain weight after they  quit smoking. However, not everyone does. To keep from gaining weight, have a plan in place before you quit and stick to the plan after you quit. Your plan should include:  Having healthy snacks. When you have a craving, it may help to: ? Eat plain popcorn, crunchy carrots, celery, or other cut vegetables. ? Chew sugar-free gum.  Changing how you eat: ? Eat small portion sizes at meals. ? Eat 4-6 small meals throughout the day instead of 1-2 large meals a day. ? Be mindful when you eat. Do not watch television or do other things that might distract you as you eat.  Exercising regularly: ? Make time to exercise each day. If you do not have time for a long workout, do short bouts of exercise for 5-10 minutes several times a day. ? Do some form of strengthening exercise, like weight lifting, and some form of aerobic exercise, like running or swimming.  Drinking plenty of water or other low-calorie or no-calorie drinks. Drink 6-8 glasses of water daily, or as much as  instructed by your health care provider.  Summary  Quitting smoking is a physical and mental challenge. You will face cravings, withdrawal symptoms, and temptation to smoke again. Preparation can help you as you go through these challenges.  You can cope with cravings by keeping your mouth busy (such as by chewing gum), keeping your body and hands busy, and making calls to family, friends, or a helpline for people who want to quit smoking.  You can cope with withdrawal symptoms by avoiding places where people smoke, avoiding drinks with caffeine, and getting plenty of rest.  Ask your health care provider about the different ways to prevent weight gain, avoid stress, and handle social situations. This information is not intended to replace advice given to you by your health care provider. Make sure you discuss any questions you have with your health care provider. Document Released: 03/28/2016 Document Revised: 03/28/2016 Document Reviewed: 03/28/2016 Elsevier Interactive Patient Education  [employer].

## 2017-06-18 ENCOUNTER — Ambulatory Visit: Payer: Medicaid Other | Admitting: Cardiology

## 2017-06-18 NOTE — Progress Notes (Deleted)
06/18/2017 Miranda [name]   April 03, 1956  [id number]  Primary Physician Fleet ContrasAvbuere, Edwin, MD Primary Cardiologist: Dr. Allyson SabalBerry   Reason for Visit/CC: Routine F/u for CAD, HTN and HLD.   HPI:   62 y/o AA female with a history of CAD, s/p CFX DES Aug 2017. She has residual 60% LAD treated medically. Her other problems include IDDM, HTN, HLD, prior CVA and chronic back pain. She was brought to the ER 12/30/16 after patient was found to be confused with hypoglycemia. Patient had gone to [employer] and while in the drive-through had near syncope. The staff at the restaurant called EMS and patient she was found to be hypoglycemic with BS in the 30s. She was admitted and her medications adjusted. In addition to adjusting her Insulin her HCTZ and Norvasc were stopped secondary to hypotension. She was seen back in clinic by Corine ShelterLuke Kilroy, PA-C, 01/2017 for post hospital f/u and pt had noted issues with hypertension at home. Her office BP was elevated in the upper 160s systolic, thus she was restarted on amlodipine, 10 mg. She was seen back by the pharmacist in our HTN clinic and BP was poorly controlled. Lisinopril was further increased to 40 mg.   She presents back to cardiology clinic today for routine f/u. She is overdue for her yearly f/u w/ Dr. Allyson SabalBerry.    No outpatient medications have been marked as taking for the 06/18/17 encounter (Appointment) with Miranda [name], Miranda Leitzke M, PA-C.   No Known Allergies Past Medical History:  Diagnosis Date  . Acid reflux   . Anxiety   . Asthma   . Degenerative disc disease   . Depression   . Diabetes mellitus   . Hypertension   . Hypoglycemia 12/30/2016  . Stroke [facility](HCC)    04/22/12   Family History  Problem Relation Age of Onset  . Hypertension Other    Past Surgical History:  Procedure Laterality Date  . CARDIAC CATHETERIZATION N/A 11/21/2015   Procedure: Left Heart Cath and Coronary Angiography;  Surgeon: Peter [name] SwazilandJordan, MD;  [facility];   Service: Cardiovascular;  Laterality: N/A;  . CARDIAC CATHETERIZATION N/A 11/21/2015   Procedure: Coronary Stent Intervention;  Surgeon: Peter [name] SwazilandJordan, MD;  [facility];  Service: Cardiovascular;  Laterality: N/A;  . KNEE SURGERY    . TUBAL LIGATION     Social History   Socioeconomic History  . Marital status: Divorced    Spouse name: Not on file  . Number of children: Not on file  . Years of education: Not on file  . Highest education level: Not on file  Social Needs  . Financial resource strain: Not on file  . Food insecurity - worry: Not on file  . Food insecurity - inability: Not on file  . Transportation needs - medical: Not on file  . Transportation needs - non-medical: Not on file  Occupational History  . Not on file  Tobacco Use  . Smoking status: Current Every Day Smoker    Packs/day: 0.25    Years: 45.00    Pack years: 11.25    Types: Cigarettes  . Smokeless tobacco: Never Used  Substance and Sexual Activity  . Alcohol use: No  . Drug use: No  . Sexual activity: No  Other Topics Concern  . Not on file  Social History Narrative  . Not on file     Review of Systems: General: negative for chills, fever, night sweats or weight changes.  Cardiovascular:  negative for chest pain, dyspnea on exertion, edema, orthopnea, palpitations, paroxysmal nocturnal dyspnea or shortness of breath Dermatological: negative for rash Respiratory: negative for cough or wheezing Urologic: negative for hematuria Abdominal: negative for nausea, vomiting, diarrhea, bright red blood per rectum, melena, or hematemesis Neurologic: negative for visual changes, syncope, or dizziness All other systems reviewed and are otherwise negative except as noted above.   Physical Exam:  There were no vitals taken for this visit.  {Physical ZOXW:9604540}  EKG *** -- personally reviewed   ASSESSMENT AND PLAN:   No problem-specific Assessment & Plan notes found for this  encounter.   PLAN  ***  Follow-Up ***  Manjot Beumer Delmer Islam, MHS [facility] 06/18/2017 9:50 AM

## 2017-06-23 ENCOUNTER — Encounter: Payer: Self-pay | Admitting: Cardiology

## 2017-08-16 ENCOUNTER — Other Ambulatory Visit: Payer: Self-pay | Admitting: Cardiovascular Disease

## 2017-08-17 NOTE — Telephone Encounter (Signed)
REFILL 

## 2017-08-27 ENCOUNTER — Other Ambulatory Visit: Payer: Self-pay | Admitting: Cardiovascular Disease

## 2017-08-28 NOTE — Telephone Encounter (Signed)
Left detailed message stating this will need to continue to be filled by PCP. Told to call if any questions.

## 2017-09-14 ENCOUNTER — Ambulatory Visit (INDEPENDENT_AMBULATORY_CARE_PROVIDER_SITE_OTHER): Payer: Medicaid Other | Admitting: Orthopaedic Surgery

## 2017-09-15 ENCOUNTER — Ambulatory Visit (INDEPENDENT_AMBULATORY_CARE_PROVIDER_SITE_OTHER): Payer: Medicaid Other | Admitting: Orthopaedic Surgery

## 2017-09-18 ENCOUNTER — Ambulatory Visit: Payer: Medicaid Other | Admitting: Cardiovascular Disease

## 2017-09-29 ENCOUNTER — Ambulatory Visit (INDEPENDENT_AMBULATORY_CARE_PROVIDER_SITE_OTHER): Payer: Medicaid Other | Admitting: Orthopaedic Surgery

## 2017-10-13 ENCOUNTER — Encounter: Payer: Self-pay | Admitting: Cardiovascular Disease

## 2017-10-13 ENCOUNTER — Ambulatory Visit: Payer: Medicaid Other | Admitting: Cardiovascular Disease

## 2017-10-13 VITALS — BP 136/70 | HR 74 | Ht 70.0 in | Wt 235.0 lb

## 2017-10-13 DIAGNOSIS — Z7902 Long term (current) use of antithrombotics/antiplatelets: Secondary | ICD-10-CM

## 2017-10-13 DIAGNOSIS — I10 Essential (primary) hypertension: Secondary | ICD-10-CM | POA: Diagnosis not present

## 2017-10-13 DIAGNOSIS — E785 Hyperlipidemia, unspecified: Secondary | ICD-10-CM | POA: Diagnosis not present

## 2017-10-13 DIAGNOSIS — Z5181 Encounter for therapeutic drug level monitoring: Secondary | ICD-10-CM | POA: Diagnosis not present

## 2017-10-13 MED ORDER — CLOPIDOGREL BISULFATE 75 MG PO TABS: 75.0000 mg | ORAL_TABLET | Freq: Every day | ORAL | 1 refills | Status: DC

## 2017-10-13 NOTE — Assessment & Plan Note (Signed)
PAD status post circumflex stenting by Dr. SwazilandJordan August 2017 with a synergy 3.5 x 12 mm long drug-eluting stent.  Otherwise she had noncritical CAD with 60% mid to distal LAD and a left dominant system with normal LV function.  She does remain on dual antiplatelet therapy including low-dose aspirin and Brilinta which we will transition to Plavix.  She denies chest pain or shortness of breath.

## 2017-10-13 NOTE — Progress Notes (Signed)
10/13/2017 Miranda ButtersChandra D Dapolito   08-14-1955  [id number]  Primary Physician Fleet ContrasAvbuere, Edwin, MD Primary Cardiologist: Runell GessJonathan J Charlissa Petros MD FACP, [place], [place], [place]  HPI:  Miranda [name] is a 62 y.o.  moderately overweight divorced African-American female mother of 3, grandmother of one grandchild who I last saw in consultation during her hospitalization in August 06/10/2016.[name]. She has a history of treated hypertension, diabetes, hyperlipidemia and tobacco abuse of approximately 20-30 pack years currently smoking 3-5 cigarettes a day. She is disabled because of her back and knees as well as diabetes. She's had a stroke in the past. She was admitted with chest pain and had a minimally elevated troponin. She underwent cardiac catheterization on 11/21/15 by Dr. SwazilandJordan revealing a 90% proximal AV groove circumflex 60% distal LAD lesion. Her circumflex was stented with a drug-eluting stent. She's had no recurrent symptoms.  She has seen Corine ShelterLuke Kilroy back at which time she was doing well.  Did have an episode of hypoglycemia when driving to [employer] restaurant evaluated by EMS with negative work-up.  Since I saw her a year and a half ago she is remained stable otherwise denying chest pain or shortness of breath.  She does continue to smoke.   Current Meds  Medication Sig  . ACCU-CHEK AVIVA PLUS test strip 1 each as needed.  [name]. albuterol (PROVENTIL HFA;VENTOLIN HFA) 108 (90 Base) MCG/ACT inhaler Inhale 2 puffs into the lungs every 6 (six) hours as needed for wheezing or shortness of breath.  [name]. aspirin 81 MG chewable tablet Chew 1 tablet (81 mg total) by mouth daily.  [name]. atorvastatin (LIPITOR) 80 MG tablet TAKE 1 TABLET EVERY DAY AT 6 PM  . B-D ULTRAFINE III SHORT PEN 31G X 8 MM MISC 1 each as needed.  [name]. buPROPion (WELLBUTRIN SR) 150 MG 12 hr tablet Take 150 mg by mouth daily.  . carvedilol (COREG) 12.5 MG tablet Take 1 tablet (12.5 mg total) by mouth 2 (two) times daily with a meal. NEED OV.  . cloNIDine  (CATAPRES) 0.2 MG tablet Take 0.2 mg by mouth daily as needed.   . Cyanocobalamin (B-12) 1000 MCG CAPS Take 1 tablet by mouth daily.  [name]. gabapentin (NEURONTIN) 300 MG capsule Take 300 mg by mouth 3 (three) times daily.   . insulin glargine (LANTUS) 100 UNIT/ML injection Inject 0.2 mLs (20 Units total) into the skin 2 (two) times daily. (Patient taking differently: Inject 20 Units into the skin 2 (two) times daily. Patient takes 50 units in the morning and 40 units at night)  . levorphanol (LEVODROMORAN) 2 MG tablet Take 2 mg by mouth 2 (two) times daily.  [name]. lisinopril (PRINIVIL,ZESTRIL) 40 MG tablet Take 1 tablet (40 mg total) by mouth at bedtime.  . metFORMIN (GLUMETZA) 1000 MG (MOD) 24 hr tablet Take 1 tablet (1,000 mg total) by mouth 2 (two) times daily with a meal. Restart tomorrow  . MOVANTIK 25 MG TABS tablet Take 25 mg by mouth every morning.  . nicotine (NICODERM CQ - DOSED IN MG/24 HOURS) 14 mg/24hr patch Place 1 patch (14 mg total) onto the skin daily.  . nicotine polacrilex ([employer] NICOTINE) 2 MG gum Take 1 each (2 mg total) by mouth as needed for smoking cessation.  . Omega-3 Fatty Acids (FISH OIL) 1000 MG CAPS Take 1 capsule by mouth daily as needed (for vitamin).   [name]. oxyCODONE (OXY IR/ROXICODONE) 5 MG immediate release tablet Take 10 mg by mouth 3 (three) times daily as needed (  pain).   . saxagliptin HCl (ONGLYZA) 2.5 MG TABS tablet Take 5 mg by mouth every morning.  . Semaglutide (OZEMPIC) 0.25 or 0.5 MG/DOSE SOPN Inject 0.5 Units into the skin once a week.  . traZODone (DESYREL) 50 MG tablet Take 50 mg by mouth at bedtime as needed for sleep.   . Vitamin D, Ergocalciferol, (DRISDOL) 50000 units CAPS capsule Take 50,000 Units by mouth every 7 (seven) days. monday  . [DISCONTINUED] ticagrelor (BRILINTA) 90 MG TABS tablet Take 1 tablet (90 mg total) by mouth 2 (two) times daily. NEED OV.     No Known Allergies  Social History   Socioeconomic History  . Marital status: Divorced     Spouse name: Not on file  . Number of children: Not on file  . Years of education: Not on file  . Highest education level: Not on file  Occupational History  . Not on file  Social Needs  . Financial resource strain: Not on file  . Food insecurity:    Worry: Not on file    Inability: Not on file  . Transportation needs:    Medical: Not on file    Non-medical: Not on file  Tobacco Use  . Smoking status: Current Every Day Smoker    Packs/day: 0.25    Years: 45.00    Pack years: 11.25    Types: Cigarettes  . Smokeless tobacco: Never Used  Substance and Sexual Activity  . Alcohol use: No  . Drug use: No  . Sexual activity: Never  Lifestyle  . Physical activity:    Days per week: Not on file    Minutes per session: Not on file  . Stress: Not on file  Relationships  . Social connections:    Talks on phone: Not on file    Gets together: Not on file    Attends religious service: Not on file    Active member of club or organization: Not on file    Attends meetings of clubs or organizations: Not on file    Relationship status: Not on file  . Intimate partner violence:    Fear of current or ex partner: Not on file    Emotionally abused: Not on file    Physically abused: Not on file    Forced sexual activity: Not on file  Other Topics Concern  . Not on file  Social History Narrative  . Not on file     Review of Systems: General: negative for chills, fever, night sweats or weight changes.  Cardiovascular: negative for chest pain, dyspnea on exertion, edema, orthopnea, palpitations, paroxysmal nocturnal dyspnea or shortness of breath Dermatological: negative for rash Respiratory: negative for cough or wheezing Urologic: negative for hematuria Abdominal: negative for nausea, vomiting, diarrhea, bright red blood per rectum, melena, or hematemesis Neurologic: negative for visual changes, syncope, or dizziness All other systems reviewed and are otherwise negative except as noted  above.    Blood pressure 136/70, pulse 74, height 5\' 10"  (1.778 m), weight 235 lb (106.6 kg).  General appearance: alert and no distress Neck: no adenopathy, no carotid bruit, no JVD, supple, symmetrical, trachea midline and thyroid not enlarged, symmetric, no tenderness/mass/nodules Lungs: clear to auscultation bilaterally Heart: regular rate and rhythm, S1, S2 normal, no murmur, click, rub or gallop Extremities: extremities normal, atraumatic, no cyanosis or edema Pulses: 2+ and symmetric Skin: Skin color, texture, turgor normal. No rashes or lesions Neurologic: Alert and oriented X 3, normal strength and tone. Normal symmetric reflexes.  Normal coordination and gait  EKG sinus rhythm at 74 inferolateral T wave inversion.  I personally reviewed this EKG.  ASSESSMENT AND PLAN:   Essential hypertension History of essential hypertension blood pressure measured today 136/70 is on labetalol amlodipine.  Continue current meds at current dosing.  Dyslipidemia This lipidemia on statin therapy.  We will recheck a lipid and liver profile.  CAD S/P percutaneous coronary angioplasty PAD status post circumflex stenting by Dr. Swaziland August 2017 with a synergy 3.5 x 12 mm long drug-eluting stent.  Otherwise she had noncritical CAD with 60% mid to distal LAD and a left dominant system with normal LV function.  She does remain on dual antiplatelet therapy including low-dose aspirin and Brilinta which we will transition to Plavix.  She denies chest pain or shortness of breath.  Tobacco abuse Continued tobacco abuse smoking 3 to 5 cigarettes a day recalcitrant to risk factor modification.      [name] MD FACP,FACC,FAHA, [facility] 10/13/2017 11:28 AM

## 2017-10-13 NOTE — Patient Instructions (Signed)
Medication Instructions: Your physician recommends that you continue on your current medications as directed. Please refer to the Current Medication list given to you today.  STOP Brilinta  START Plavix 75 mg   Labwork: Your physician recommends that you return for a FASTING lipid profile and hepatic function panel at your earliest convenience.  Your physician recommends that you return for lab work 2 weeks after starting Plavix--[id number]. You will need to stop at registration.   Follow-Up: We request that you follow-up in: 6 months with Corine ShelterLuke Kilroy, PA and in 12 months with Dr San MorelleBerry  You will receive a reminder letter in the mail two months in advance. If you don't receive a letter, please call our office to schedule the follow-up appointment.  If you need a refill on your cardiac medications before your next appointment, please call your pharmacy.

## 2017-10-13 NOTE — Assessment & Plan Note (Signed)
This lipidemia on statin therapy.  We will recheck a lipid and liver profile.

## 2017-10-13 NOTE — Assessment & Plan Note (Signed)
History of essential hypertension blood pressure measured today 136/70 is on labetalol amlodipine.  Continue current meds at current dosing.

## 2017-10-13 NOTE — Assessment & Plan Note (Signed)
Continued tobacco abuse smoking 3 to 5 cigarettes a day recalcitrant to risk factor modification.

## 2017-10-28 ENCOUNTER — Ambulatory Visit (INDEPENDENT_AMBULATORY_CARE_PROVIDER_SITE_OTHER): Payer: Medicaid Other | Admitting: Orthopaedic Surgery

## 2017-11-02 ENCOUNTER — Ambulatory Visit (INDEPENDENT_AMBULATORY_CARE_PROVIDER_SITE_OTHER): Payer: Medicaid Other | Admitting: Orthopaedic Surgery

## 2017-11-09 ENCOUNTER — Ambulatory Visit (INDEPENDENT_AMBULATORY_CARE_PROVIDER_SITE_OTHER): Payer: Medicaid Other | Admitting: Orthopaedic Surgery

## 2017-11-09 ENCOUNTER — Encounter (INDEPENDENT_AMBULATORY_CARE_PROVIDER_SITE_OTHER): Payer: Self-pay | Admitting: Orthopaedic Surgery

## 2017-11-09 ENCOUNTER — Ambulatory Visit (INDEPENDENT_AMBULATORY_CARE_PROVIDER_SITE_OTHER): Payer: Medicaid Other

## 2017-11-09 ENCOUNTER — Ambulatory Visit (INDEPENDENT_AMBULATORY_CARE_PROVIDER_SITE_OTHER): Payer: Self-pay

## 2017-11-09 DIAGNOSIS — M542 Cervicalgia: Secondary | ICD-10-CM | POA: Diagnosis not present

## 2017-11-09 DIAGNOSIS — M25562 Pain in left knee: Secondary | ICD-10-CM | POA: Diagnosis not present

## 2017-11-09 DIAGNOSIS — M25561 Pain in right knee: Secondary | ICD-10-CM

## 2017-11-09 DIAGNOSIS — G8929 Other chronic pain: Secondary | ICD-10-CM

## 2017-11-09 MED ORDER — METHYLPREDNISOLONE ACETATE 40 MG/ML IJ SUSP
40.0000 mg | INTRAMUSCULAR | Status: AC | PRN
  Administered 2017-11-09: 40 mg via INTRA_ARTICULAR

## 2017-11-09 MED ORDER — LIDOCAINE HCL 1 % IJ SOLN
3.0000 mL | INTRAMUSCULAR | Status: AC | PRN
  Administered 2017-11-09: 3 mL

## 2017-11-09 NOTE — Progress Notes (Signed)
Office Visit Note   Patient: Miranda [name]           Date of Birth: Nov 02, 1955           MRN: [id number] Visit Date: 11/09/2017              Requested by: [name], MD [address] [place], [place] [address] PCP: [name], MD   Assessment & Plan: Visit Diagnoses:  1. Chronic pain of left knee   2. Chronic pain of right knee   3. Neck pain     Plan: Obviously we are going to try to go slow with her.  She needs physical therapy on her cervical spine to decrease her pain and improve her neck function as well as bilateral lower extremity strengthening.  I did provide steroid injections in her knees today which she requested I think is reasonable.  She will watch her blood glucose closely.  She is also definitely candidate for repeat hyaluronic acid injections in the knees this is been well over a year since she is had this before and that helped her the most.  All questions concerns were answered and addressed.  We will see her back in 4 weeks after course of therapy and to place hyaluronic acid hopefully in both knees.  Follow-Up Instructions: Return in about 1 month (around 12/07/2017).   Orders:  Orders Placed This Encounter  Procedures  . Large Joint Inj  . Large Joint Inj  . XR Knee 1-2 Views Left  . XR Knee 1-2 Views Right  . XR Cervical Spine 2 or 3 views   No orders of the defined types were placed in this encounter.     Procedures: Large Joint Inj: R knee on 11/09/2017 4:12 PM Indications: diagnostic evaluation and pain Details: 22 G 1.5 in needle, superolateral approach  Arthrogram: No  Medications: 3 mL lidocaine 1 %; 40 mg methylPREDNISolone acetate 40 MG/ML Outcome: tolerated well, no immediate complications Procedure, treatment alternatives, risks and benefits explained, specific risks discussed. Consent was given by the patient. Immediately prior to procedure a time out was called to verify the correct patient, procedure, equipment, support  staff and site/side marked as required. Patient was prepped and draped in the usual sterile fashion.   Large Joint Inj: L knee on 11/09/2017 4:12 PM Indications: diagnostic evaluation and pain Details: 22 G 1.5 in needle, superolateral approach  Arthrogram: No  Medications: 3 mL lidocaine 1 %; 40 mg methylPREDNISolone acetate 40 MG/ML Outcome: tolerated well, no immediate complications Procedure, treatment alternatives, risks and benefits explained, specific risks discussed. Consent was given by the patient. Immediately prior to procedure a time out was called to verify the correct patient, procedure, equipment, support staff and site/side marked as required. Patient was prepped and draped in the usual sterile fashion.       Clinical Data: No additional findings.   Subjective: Chief Complaint  Patient presents with  . Left Knee - Pain  . Right Knee - Pain  The patient comes in today with chief complaint of bilateral knee pain which is chronic for her and neck pain.  She is someone who is in pain management and takes oxycodone daily.  She has had steroid injections in the knees in the past as well as what she describes as gel shots.  This is likely hyaluronic acid.  She said this worked for well over a year for her.  Her pain is daily and it is detrimentally affecting her mobility  and her quality of life.  She is been having neck pain as well.  She is interested in trying physical therapy.  She is also diabetic and last year had a hemoglobin A1c of just over 9.  She reports better blood glucose control but she has not had a recent A1c.  HPI  Review of Systems She currently denies any headache, chest pain, shortness of breath, fever, chills, nausea, vomiting.  Objective: Vital Signs: There were no vitals taken for this visit.  Physical Exam She is alert and oriented x3 and in no acute distress Ortho Exam Examination of her knee she has significant patellofemoral crepitation.  She  has medial joint line tenderness of both knees and a mild effusion.  Both knees have full range of motion and are ligamentously stable.  Her cervical spine is stiff with lateral rotation and bending.  She has good strength in her bilateral upper extremities and normal sensation in her hands. Specialty Comments:  No specialty comments available.  Imaging: Xr Knee 1-2 Views Left  Result Date: 11/09/2017 2 views of the left knee show severe arthritic changes with medial joint space narrowing and para-articular osteophytes throughout the knee.  Xr Cervical Spine 2 Or 3 Views  Result Date: 11/09/2017 2 views of the cervical spine show no acute findings.  There is loss of cervical lordosis.  There is degenerative changes at multiple levels of the mid cervical spine.  Xr Knee 1-2 Views Right  Result Date: 11/09/2017 2 views of the right knee shows severe osteoarthritis with medial joint space narrowing and significant para-articular osteophytes throughout the knee.    PMFS History: Patient Active Problem List   Diagnosis Date Noted  . CAD S/P percutaneous coronary angioplasty 01/29/2017  . Tobacco abuse 01/29/2017  . Chronic back pain 01/29/2017  . Hypoglycemia 12/30/2016  . Insulin dependent diabetes mellitus (HCC) 11/21/2015  . Dyslipidemia 11/21/2015  . NSTEMI (non-ST elevated myocardial infarction) (HCC)   . TIA (transient ischemic attack) 05/31/2014  . History of stroke 04/22/2012  . Hypokalemia 04/22/2012  . Diabetes mellitus (HCC) 04/22/2012  . Essential hypertension 04/22/2012   Past Medical History:  Diagnosis Date  . Acid reflux   . Anxiety   . Asthma   . Degenerative disc disease   . Depression   . Diabetes mellitus   . Hypertension   . Hypoglycemia 12/30/2016  . Stroke [facility](HCC)    04/22/12    Family History  Problem Relation Age of Onset  . Hypertension Other     Past Surgical History:  Procedure Laterality Date  . CARDIAC CATHETERIZATION N/A 11/21/2015    Procedure: Left Heart Cath and Coronary Angiography;  Surgeon: Peter M SwazilandJordan, MD;  [facility];  Service: Cardiovascular;  Laterality: N/A;  . CARDIAC CATHETERIZATION N/A 11/21/2015   Procedure: Coronary Stent Intervention;  Surgeon: Peter M SwazilandJordan, MD;  [facility];  Service: Cardiovascular;  Laterality: N/A;  . KNEE SURGERY    . TUBAL LIGATION     Social History   Occupational History  . Not on file  Tobacco Use  . Smoking status: Current Every Day Smoker    Packs/day: 0.25    Years: 45.00    Pack years: 11.25    Types: Cigarettes  . Smokeless tobacco: Never Used  Substance and Sexual Activity  . Alcohol use: No  . Drug use: No  . Sexual activity: Never

## 2017-11-10 ENCOUNTER — Emergency Department (HOSPITAL_COMMUNITY)
Admission: EM | Admit: 2017-11-10 | Discharge: 2017-11-11 | Disposition: A | Discharge status: Home / Self Care | Payer: Medicaid Other | Attending: Emergency Medicine | Admitting: Emergency Medicine

## 2017-11-10 ENCOUNTER — Emergency Department (HOSPITAL_COMMUNITY): Payer: Medicaid Other

## 2017-11-10 ENCOUNTER — Other Ambulatory Visit (INDEPENDENT_AMBULATORY_CARE_PROVIDER_SITE_OTHER): Payer: Self-pay

## 2017-11-10 ENCOUNTER — Encounter (HOSPITAL_COMMUNITY): Payer: Self-pay | Admitting: Emergency Medicine

## 2017-11-10 ENCOUNTER — Other Ambulatory Visit: Payer: Self-pay

## 2017-11-10 DIAGNOSIS — I10 Essential (primary) hypertension: Secondary | ICD-10-CM | POA: Insufficient documentation

## 2017-11-10 DIAGNOSIS — E119 Type 2 diabetes mellitus without complications: Secondary | ICD-10-CM | POA: Insufficient documentation

## 2017-11-10 DIAGNOSIS — Z7902 Long term (current) use of antithrombotics/antiplatelets: Secondary | ICD-10-CM | POA: Diagnosis not present

## 2017-11-10 DIAGNOSIS — Z7982 Long term (current) use of aspirin: Secondary | ICD-10-CM | POA: Diagnosis not present

## 2017-11-10 DIAGNOSIS — I2510 Atherosclerotic heart disease of native coronary artery without angina pectoris: Secondary | ICD-10-CM | POA: Insufficient documentation

## 2017-11-10 DIAGNOSIS — R079 Chest pain, unspecified: Secondary | ICD-10-CM | POA: Diagnosis present

## 2017-11-10 DIAGNOSIS — T50901A Poisoning by unspecified drugs, medicaments and biological substances, accidental (unintentional), initial encounter: Secondary | ICD-10-CM | POA: Insufficient documentation

## 2017-11-10 DIAGNOSIS — M542 Cervicalgia: Secondary | ICD-10-CM

## 2017-11-10 DIAGNOSIS — Z794 Long term (current) use of insulin: Secondary | ICD-10-CM | POA: Diagnosis not present

## 2017-11-10 DIAGNOSIS — F17210 Nicotine dependence, cigarettes, uncomplicated: Secondary | ICD-10-CM | POA: Insufficient documentation

## 2017-11-10 DIAGNOSIS — J45909 Unspecified asthma, uncomplicated: Secondary | ICD-10-CM | POA: Diagnosis not present

## 2017-11-10 DIAGNOSIS — Z79899 Other long term (current) drug therapy: Secondary | ICD-10-CM | POA: Diagnosis not present

## 2017-11-10 LAB — CBC
HCT: 43.6 % (ref 36.0–46.0)
Hemoglobin: 13.1 g/dL (ref 12.0–15.0)
MCH: 27.1 pg (ref 26.0–34.0)
MCHC: 30 g/dL (ref 30.0–36.0)
MCV: 90.3 fL (ref 78.0–100.0)
Platelets: 364 10*3/uL (ref 150–400)
RBC: 4.83 MIL/uL (ref 3.87–5.11)
RDW: 13.9 % (ref 11.5–15.5)
WBC: 15 10*3/uL — AB (ref 4.0–10.5)

## 2017-11-10 LAB — BASIC METABOLIC PANEL
Anion gap: 9 (ref 5–15)
BUN: 22 mg/dL — ABNORMAL HIGH (ref 6–20)
CO2: 25 mmol/L (ref 22–32)
Calcium: 9.7 mg/dL (ref 8.9–10.3)
Chloride: 107 mmol/L (ref 98–111)
Creatinine, Ser: 1.03 mg/dL — ABNORMAL HIGH (ref 0.44–1.00)
GFR calc non Af Amer: 58 mL/min — ABNORMAL LOW (ref >60)
Glucose, Bld: 204 mg/dL — ABNORMAL HIGH (ref 70–99)
Potassium: 3.8 mmol/L (ref 3.5–5.1)
Sodium: 141 mmol/L (ref 135–145)

## 2017-11-10 LAB — I-STAT TROPONIN, ED: Troponin i, poc: 0 ng/mL (ref 0.00–0.08)

## 2017-11-10 NOTE — ED Triage Notes (Addendum)
Pt states she was instructing a family member on how to use a narcan auto-injector, but accidentally injected herself in the R thigh with an actual dose, not realizing she didn't have the trainer in her hand. Now reports h/a and chest tightness since injecting herself.

## 2017-11-11 LAB — I-STAT TROPONIN, ED: Troponin i, poc: 0.01 ng/mL (ref 0.00–0.08)

## 2017-11-11 NOTE — ED Notes (Signed)
Pt discharged from ED; instructions provided; Pt encouraged to return to ED if symptoms worsen and to f/u with PCP; Pt verbalized understanding of all instructions 

## 2017-11-11 NOTE — ED Provider Notes (Signed)
[facility] EMERGENCY DEPARTMENT Provider Note   CSN: [id number] Arrival date & time: 11/10/17  2143     History   Chief Complaint Chief Complaint  Patient presents with  . Chest Pain  . Drug Overdose    HPI Miranda [name] is a 62 y.o. female.  Patient presents to the emergency department for evaluation of chest pain.  Patient reports that she was showing a family member how to use a Narcan autoinjector when she accidentally injected herself.  She thought she had the trainer but it was a real injector.  Patient became extremely nervous and anxious after this occurred.  She then started to develop a headache and chest pain.  She does have a history of stents.  Patient reports that since she has been here in the ER, pain has slowly improved and now she has no chest pain.  She is not expensing any shortness of breath.     Past Medical History:  Diagnosis Date  . Acid reflux   . Anxiety   . Asthma   . Degenerative disc disease   . Depression   . Diabetes mellitus   . Hypertension   . Hypoglycemia 12/30/2016  . Stroke [facility])    04/22/12    Patient Active Problem List   Diagnosis Date Noted  . CAD S/P percutaneous coronary angioplasty 01/29/2017  . Tobacco abuse 01/29/2017  . Chronic back pain 01/29/2017  . Hypoglycemia 12/30/2016  . Insulin dependent diabetes mellitus (HCC) 11/21/2015  . Dyslipidemia 11/21/2015  . NSTEMI (non-ST elevated myocardial infarction) (HCC)   . TIA (transient ischemic attack) 05/31/2014  . History of stroke 04/22/2012  . Hypokalemia 04/22/2012  . Diabetes mellitus (HCC) 04/22/2012  . Essential hypertension 04/22/2012    Past Surgical History:  Procedure Laterality Date  . CARDIAC CATHETERIZATION N/A 11/21/2015   Procedure: Left Heart Cath and Coronary Angiography;  Surgeon: Peter M Swaziland, MD;  Location: [facility] INVASIVE CV LAB;  Service: Cardiovascular;  Laterality: N/A;  . CARDIAC CATHETERIZATION N/A 11/21/2015   Procedure:  Coronary Stent Intervention;  Surgeon: Peter M Swaziland, MD;  Location: [facility] INVASIVE CV LAB;  Service: Cardiovascular;  Laterality: N/A;  . KNEE SURGERY    . TUBAL LIGATION       OB History   None      Home Medications    Prior to Admission medications   Medication Sig Start Date End Date Taking? Authorizing Provider  ACCU-CHEK AVIVA PLUS test strip 1 each as needed. 10/14/16   [provider]  albuterol (PROVENTIL HFA;VENTOLIN HFA) 108 (90 Base) MCG/ACT inhaler Inhale 2 puffs into the lungs every 6 (six) hours as needed for wheezing or shortness of breath.    [provider]  amLODipine (NORVASC) 10 MG tablet Take 1 tablet (10 mg total) by mouth daily. 01/29/17 05/14/17  Abelino Derrick, PA-C  aspirin 81 MG chewable tablet Chew 1 tablet (81 mg total) by mouth daily. 11/22/15   Zannie Cove, MD  atorvastatin (LIPITOR) 80 MG tablet TAKE 1 TABLET EVERY DAY AT 6 PM 02/13/17   [name], MD  B-D ULTRAFINE III SHORT PEN 31G X 8 MM MISC 1 each as needed. 11/18/16   [provider]  buPROPion (WELLBUTRIN SR) 150 MG 12 hr tablet Take 150 mg by mouth daily. 12/12/16   [provider]  carvedilol (COREG) 12.5 MG tablet Take 1 tablet (12.5 mg total) by mouth 2 (two) times daily with a meal. NEED OV. 08/17/17  [name], MD  cloNIDine (CATAPRES) 0.2 MG tablet Take 0.2 mg by mouth daily as needed.     [provider]  clopidogrel (PLAVIX) 75 MG tablet Take 1 tablet (75 mg total) by mouth daily. 10/13/17   [name], MD  Cyanocobalamin (B-12) 1000 MCG CAPS Take 1 tablet by mouth daily.    [provider]  gabapentin (NEURONTIN) 300 MG capsule Take 300 mg by mouth 3 (three) times daily.     [provider]  insulin glargine (LANTUS) 100 UNIT/ML injection Inject 0.2 mLs (20 Units total) into the skin 2 (two) times daily. Patient taking differently: Inject 20 Units into the skin 2 (two) times daily. Patient takes 50 units in the  morning and 40 units at night 12/31/16   Marlin Canary U, DO  levorphanol (LEVODROMORAN) 2 MG tablet Take 2 mg by mouth 2 (two) times daily. 09/17/17   [provider]  lisinopril (PRINIVIL,ZESTRIL) 40 MG tablet Take 1 tablet (40 mg total) by mouth at bedtime. 05/14/17   [name], MD  metFORMIN (GLUMETZA) 1000 MG (MOD) 24 hr tablet Take 1 tablet (1,000 mg total) by mouth 2 (two) times daily with a meal. Restart tomorrow 11/23/15   Zannie Cove, MD  MOVANTIK 25 MG TABS tablet Take 25 mg by mouth every morning. 12/12/16   [provider]  nicotine (NICODERM CQ - DOSED IN MG/24 HOURS) 14 mg/24hr patch Place 1 patch (14 mg total) onto the skin daily. 05/14/17   [name], MD  nicotine polacrilex ([employer] NICOTINE) 2 MG gum Take 1 each (2 mg total) by mouth as needed for smoking cessation. 05/14/17   [name], MD  Omega-3 Fatty Acids (FISH OIL) 1000 MG CAPS Take 1 capsule by mouth daily as needed (for vitamin).     [provider]  oxyCODONE (OXY IR/ROXICODONE) 5 MG immediate release tablet Take 10 mg by mouth 3 (three) times daily as needed (pain).     Schinlever, Santina Evans, PA-C  saxagliptin HCl (ONGLYZA) 2.5 MG TABS tablet Take 5 mg by mouth every morning.    [provider]  Semaglutide (OZEMPIC) 0.25 or 0.5 MG/DOSE SOPN Inject 0.5 Units into the skin once a week.    [provider]  traZODone (DESYREL) 50 MG tablet Take 50 mg by mouth at bedtime as needed for sleep.     [provider]  Vitamin D, Ergocalciferol, (DRISDOL) 50000 units CAPS capsule Take 50,000 Units by mouth every 7 (seven) days. monday    [provider]    Family History Family History  Problem Relation Age of Onset  . Hypertension Other     Social History Social History   Tobacco Use  . Smoking status: Current Every Day Smoker    Packs/day: 0.25    Years: 45.00    Pack years: 11.25    Types: Cigarettes  . Smokeless tobacco: Never Used    Substance Use Topics  . Alcohol use: No  . Drug use: No     Allergies   Patient has no known allergies.   Review of Systems Review of Systems  Cardiovascular: Positive for chest pain.  Psychiatric/Behavioral: The patient is nervous/anxious.   All other systems reviewed and are negative.    Physical Exam Updated Vital Signs BP (!) 152/86   Pulse 70   Temp 98.5 F (36.9 C) (Oral)   Resp 20   Ht 5\' 10"  (1.778 m)   Wt 104.3 kg (230  lb)   SpO2 94%   BMI 33.00 kg/m   Physical Exam  Constitutional: She is oriented to person, place, and time. She appears well-developed and well-nourished. No distress.  HENT:  Head: Normocephalic and atraumatic.  Right Ear: Hearing normal.  Left Ear: Hearing normal.  Nose: Nose normal.  Mouth/Throat: Oropharynx is clear and moist and mucous membranes are normal.  Eyes: Pupils are equal, round, and reactive to light. Conjunctivae and EOM are normal.  Neck: Normal range of motion. Neck supple.  Cardiovascular: Regular rhythm, S1 normal and S2 normal. Exam reveals no gallop and no friction rub.  No murmur heard. Pulmonary/Chest: Effort normal and breath sounds normal. No respiratory distress. She exhibits no tenderness.  Abdominal: Soft. Normal appearance and bowel sounds are normal. There is no hepatosplenomegaly. There is no tenderness. There is no rebound, no guarding, no tenderness at McBurney's point and negative Murphy's sign. No hernia.  Musculoskeletal: Normal range of motion.  Neurological: She is alert and oriented to person, place, and time. She has normal strength. No cranial nerve deficit or sensory deficit. Coordination normal. GCS eye subscore is 4. GCS verbal subscore is 5. GCS motor subscore is 6.  Skin: Skin is warm, dry and intact. No rash noted. No cyanosis.  Psychiatric: She has a normal mood and affect. Her speech is normal and behavior is normal. Thought content normal.  Nursing note and vitals reviewed.    ED  Treatments / Results  Labs (all labs ordered are listed, but only abnormal results are displayed) Labs Reviewed  BASIC METABOLIC PANEL - Abnormal; Notable for the following components:      Result Value   Glucose, Bld 204 (*)    BUN 22 (*)    Creatinine, Ser 1.03 (*)    GFR calc non Af Amer 58 (*)    All other components within normal limits  CBC - Abnormal; Notable for the following components:   WBC 15.0 (*)    All other components within normal limits  I-STAT TROPONIN, ED  I-STAT TROPONIN, ED    EKG EKG Interpretation  Date/Time:  Tuesday November 10 2017 21:58:20 EDT Ventricular Rate:  75 PR Interval:  170 QRS Duration: 90 QT Interval:  386 QTC Calculation: 431 R Axis:   15 Text Interpretation:  Sinus rhythm with occasional Premature ventricular complexes Nonspecific T wave abnormality Abnormal ECG No significant change since last tracing Confirmed by Gilda Crease [phone]) on 11/11/2017 12:34:09 AM   Radiology Dg Chest 2 View  Result Date: 11/10/2017 CLINICAL DATA:  Chest pain EXAM: CHEST - 2 VIEW COMPARISON:  12/31/2016 FINDINGS: No acute opacity or effusion. No pneumothorax. Borderline cardiomegaly with aortic atherosclerosis. IMPRESSION: No active cardiopulmonary disease. Electronically Signed   By: Jasmine Pang M.D.   On: 11/10/2017 22:41   Xr Knee 1-2 Views Left  Result Date: 11/09/2017 2 views of the left knee show severe arthritic changes with medial joint space narrowing and para-articular osteophytes throughout the knee.  Xr Cervical Spine 2 Or 3 Views  Result Date: 11/09/2017 2 views of the cervical spine show no acute findings.  There is loss of cervical lordosis.  There is degenerative changes at multiple levels of the mid cervical spine.  Xr Knee 1-2 Views Right  Result Date: 11/09/2017 2 views of the right knee shows severe osteoarthritis with medial joint space narrowing and significant para-articular osteophytes throughout the  knee.   Procedures Procedures (including critical care time)  Medications Ordered in ED Medications - No  data to display   Initial Impression / Assessment and Plan / ED Course  I have reviewed the triage vital signs and the nursing notes.  Pertinent labs & imaging results that were available during my care of the patient were reviewed by me and considered in my medical decision making (see chart for details).     Patient presents to the emergency department for evaluation of chest pain.  Patient was teaching a family member how to use a Narcan autoinjector when she accidentally injected herself.  She does chronically use 10 mg of oxycodone IR 3 times a day.  This likely precipitated some mild withdrawal symptoms.  This included headache and chest pain.  As she does, however, have a history of coronary artery disease, cardiac evaluation was performed.  EKG does not show evidence of ischemia or infarct.  Initial troponin was negative.  Patient monitored for 5 hours and then second troponin performed, also negative.  At this point it is felt that the patient's chest pain is likely secondary to the stressful event that occurred, unlikely to be acute coronary syndrome.  She is felt to be safe for discharge, follow-up with PCP.  Return if symptoms worsen.  Final Clinical Impressions(s) / ED Diagnoses   Final diagnoses:  Accidental medication error, initial encounter  Chest pain, unspecified type    ED Discharge Orders    None       Gilda CreasePollina, Jessieca Rhem J, MD 11/11/17 [phone]

## 2017-11-11 NOTE — ED Notes (Signed)
Signature pad not working. 

## 2017-11-13 ENCOUNTER — Other Ambulatory Visit: Payer: Self-pay | Admitting: Cardiovascular Disease

## 2017-11-13 NOTE — Telephone Encounter (Signed)
Rx request sent to pharmacy.  

## 2017-11-16 ENCOUNTER — Other Ambulatory Visit: Payer: Self-pay | Admitting: *Deleted

## 2017-11-16 ENCOUNTER — Encounter: Payer: Self-pay | Admitting: *Deleted

## 2017-11-16 DIAGNOSIS — I2510 Atherosclerotic heart disease of native coronary artery without angina pectoris: Secondary | ICD-10-CM

## 2017-11-16 DIAGNOSIS — I4901 Ventricular fibrillation: Secondary | ICD-10-CM

## 2017-11-16 DIAGNOSIS — Z9861 Coronary angioplasty status: Secondary | ICD-10-CM

## 2017-11-16 NOTE — Progress Notes (Signed)
p2y 

## 2017-11-24 ENCOUNTER — Telehealth (INDEPENDENT_AMBULATORY_CARE_PROVIDER_SITE_OTHER): Payer: Self-pay | Admitting: Orthopaedic Surgery

## 2017-11-24 ENCOUNTER — Other Ambulatory Visit: Payer: Self-pay | Admitting: Internal Medicine

## 2017-11-24 DIAGNOSIS — E2839 Other primary ovarian failure: Secondary | ICD-10-CM

## 2017-11-24 NOTE — Telephone Encounter (Signed)
11/09/2017 OV note faxed to Dr. Sherilyn BankerAsbury [phone]

## 2017-12-03 ENCOUNTER — Encounter: Payer: Self-pay | Admitting: Gastroenterology

## 2017-12-10 ENCOUNTER — Ambulatory Visit (INDEPENDENT_AMBULATORY_CARE_PROVIDER_SITE_OTHER): Payer: Medicaid Other | Admitting: Orthopaedic Surgery

## 2017-12-18 ENCOUNTER — Encounter: Payer: Self-pay | Admitting: General Practice

## 2017-12-21 ENCOUNTER — Telehealth (INDEPENDENT_AMBULATORY_CARE_PROVIDER_SITE_OTHER): Payer: Self-pay

## 2017-12-21 ENCOUNTER — Encounter: Payer: Self-pay | Admitting: General Practice

## 2017-12-21 ENCOUNTER — Encounter (INDEPENDENT_AMBULATORY_CARE_PROVIDER_SITE_OTHER): Payer: Self-pay | Admitting: Orthopaedic Surgery

## 2017-12-21 ENCOUNTER — Ambulatory Visit (INDEPENDENT_AMBULATORY_CARE_PROVIDER_SITE_OTHER): Payer: Medicaid Other | Admitting: Orthopaedic Surgery

## 2017-12-21 DIAGNOSIS — G8929 Other chronic pain: Secondary | ICD-10-CM

## 2017-12-21 DIAGNOSIS — M25562 Pain in left knee: Secondary | ICD-10-CM

## 2017-12-21 DIAGNOSIS — M25561 Pain in right knee: Secondary | ICD-10-CM

## 2017-12-21 NOTE — Telephone Encounter (Signed)
Please submit for gel injections- bil knee- Dr Magnus Ivan.

## 2017-12-21 NOTE — Progress Notes (Signed)
The patient was coming today to hopefully have hyaluronic acid injection for her knee.  However it is not been approved ordered yet.  She understands that we cannot provide that injection until we have it.  This is a no charge visit today.

## 2017-12-21 NOTE — Telephone Encounter (Signed)
Mailed [employer] Patient [employer] application to patient's current address to complete and return for Monovisc, bilateral knee injection.

## 2017-12-21 NOTE — Telephone Encounter (Signed)
Noted.  Will submit for Monovisc through [employer] Patient [employer], due to patient having medicaid.  Process will take a while to be approved.

## 2018-01-15 ENCOUNTER — Ambulatory Visit: Payer: Medicaid Other | Admitting: Physician Assistant

## 2018-01-22 ENCOUNTER — Encounter: Payer: Medicaid Other | Admitting: Obstetrics and Gynecology

## 2018-01-25 ENCOUNTER — Encounter: Payer: Self-pay | Admitting: Gastroenterology

## 2018-01-26 ENCOUNTER — Encounter: Payer: Self-pay | Admitting: General Practice

## 2018-01-28 ENCOUNTER — Telehealth (INDEPENDENT_AMBULATORY_CARE_PROVIDER_SITE_OTHER): Payer: Self-pay

## 2018-01-28 NOTE — Telephone Encounter (Signed)
Faxed PRF to [employer] at [phone].

## 2018-02-01 ENCOUNTER — Encounter: Payer: Medicaid Other | Admitting: Gastroenterology

## 2018-02-04 ENCOUNTER — Encounter (HOSPITAL_COMMUNITY): Payer: Self-pay

## 2018-02-04 ENCOUNTER — Emergency Department (HOSPITAL_COMMUNITY)
Admission: EM | Admit: 2018-02-04 | Discharge: 2018-02-05 | Disposition: A | Discharge status: Home / Self Care | Payer: Medicaid Other | Attending: Emergency Medicine | Admitting: Emergency Medicine

## 2018-02-04 ENCOUNTER — Emergency Department (HOSPITAL_COMMUNITY): Payer: Medicaid Other

## 2018-02-04 ENCOUNTER — Other Ambulatory Visit: Payer: Self-pay

## 2018-02-04 DIAGNOSIS — Z7982 Long term (current) use of aspirin: Secondary | ICD-10-CM | POA: Insufficient documentation

## 2018-02-04 DIAGNOSIS — F17210 Nicotine dependence, cigarettes, uncomplicated: Secondary | ICD-10-CM | POA: Diagnosis not present

## 2018-02-04 DIAGNOSIS — E119 Type 2 diabetes mellitus without complications: Secondary | ICD-10-CM | POA: Insufficient documentation

## 2018-02-04 DIAGNOSIS — R1011 Right upper quadrant pain: Secondary | ICD-10-CM | POA: Diagnosis present

## 2018-02-04 DIAGNOSIS — J45909 Unspecified asthma, uncomplicated: Secondary | ICD-10-CM | POA: Insufficient documentation

## 2018-02-04 DIAGNOSIS — K859 Acute pancreatitis without necrosis or infection, unspecified: Secondary | ICD-10-CM | POA: Diagnosis not present

## 2018-02-04 DIAGNOSIS — Z794 Long term (current) use of insulin: Secondary | ICD-10-CM | POA: Diagnosis not present

## 2018-02-04 DIAGNOSIS — I10 Essential (primary) hypertension: Secondary | ICD-10-CM | POA: Insufficient documentation

## 2018-02-04 DIAGNOSIS — Z79899 Other long term (current) drug therapy: Secondary | ICD-10-CM | POA: Insufficient documentation

## 2018-02-04 DIAGNOSIS — R0789 Other chest pain: Secondary | ICD-10-CM | POA: Diagnosis not present

## 2018-02-04 LAB — BASIC METABOLIC PANEL
Anion gap: 11 (ref 5–15)
BUN: 10 mg/dL (ref 8–23)
CHLORIDE: 103 mmol/L (ref 98–111)
CO2: 29 mmol/L (ref 22–32)
CREATININE: 0.76 mg/dL (ref 0.44–1.00)
Calcium: 9.4 mg/dL (ref 8.9–10.3)
GFR calc Af Amer: >60 mL/min (ref >60)
GFR calc non Af Amer: >60 mL/min (ref >60)
Glucose, Bld: 254 mg/dL — ABNORMAL HIGH (ref 70–99)
Potassium: 3.5 mmol/L (ref 3.5–5.1)
Sodium: 143 mmol/L (ref 135–145)

## 2018-02-04 LAB — CBC
HEMATOCRIT: 44.2 % (ref 36.0–46.0)
Hemoglobin: 13.4 g/dL (ref 12.0–15.0)
MCH: 27.4 pg (ref 26.0–34.0)
MCHC: 30.3 g/dL (ref 30.0–36.0)
MCV: 90.4 fL (ref 80.0–100.0)
NRBC: 0 % (ref 0.0–0.2)
PLATELETS: 332 10*3/uL (ref 150–400)
RBC: 4.89 MIL/uL (ref 3.87–5.11)
RDW: 13.9 % (ref 11.5–15.5)
WBC: 8.6 10*3/uL (ref 4.0–10.5)

## 2018-02-04 LAB — I-STAT TROPONIN, ED: TROPONIN I, POC: 0 ng/mL (ref 0.00–0.08)

## 2018-02-04 NOTE — ED Triage Notes (Signed)
Pt having chest pain in the left chest for the last few days.  Pain on and off for the last month but worse last 3 days.  Shortness of breath and states it hurts to talk, denies any nausea or vomiting.

## 2018-02-05 ENCOUNTER — Emergency Department (HOSPITAL_COMMUNITY): Payer: Medicaid Other

## 2018-02-05 LAB — HEPATIC FUNCTION PANEL
ALK PHOS: 120 U/L (ref 38–126)
ALT: 12 U/L (ref 0–44)
AST: 12 U/L — ABNORMAL LOW (ref 15–41)
Albumin: 3.5 g/dL (ref 3.5–5.0)
BILIRUBIN TOTAL: 0.3 mg/dL (ref 0.3–1.2)
Total Protein: 7 g/dL (ref 6.5–8.1)

## 2018-02-05 LAB — I-STAT TROPONIN, ED: Troponin i, poc: 0 ng/mL (ref 0.00–0.08)

## 2018-02-05 LAB — LIPASE, BLOOD: LIPASE: 128 U/L — AB (ref 11–51)

## 2018-02-05 LAB — CBG MONITORING, ED: Glucose-Capillary: 159 mg/dL — ABNORMAL HIGH (ref 70–99)

## 2018-02-05 MED ORDER — MORPHINE SULFATE (PF) 4 MG/ML IV SOLN
4.0000 mg | Freq: Once | INTRAVENOUS | Status: AC
  Administered 2018-02-05: 4 mg via INTRAVENOUS
  Filled 2018-02-05: qty 1

## 2018-02-05 MED ORDER — SODIUM CHLORIDE 0.9 % IV BOLUS (SEPSIS)
1000.0000 mL | Freq: Once | INTRAVENOUS | Status: AC
  Administered 2018-02-05: 1000 mL via INTRAVENOUS

## 2018-02-05 MED ORDER — ONDANSETRON HCL 4 MG/2ML IJ SOLN
4.0000 mg | Freq: Once | INTRAMUSCULAR | Status: AC
  Administered 2018-02-05: 4 mg via INTRAVENOUS
  Filled 2018-02-05: qty 2

## 2018-02-05 MED ORDER — OXYCODONE-ACETAMINOPHEN 5-325 MG PO TABS: 1.0000 | ORAL_TABLET | ORAL | 0 refills | Status: DC | PRN

## 2018-02-05 MED ORDER — ONDANSETRON 4 MG PO TBDP: 4.0000 mg | ORAL_TABLET | Freq: Four times a day (QID) | ORAL | 0 refills | Status: DC | PRN

## 2018-02-05 NOTE — ED Notes (Signed)
No answer x4 for reassess vitalsigns

## 2018-02-05 NOTE — ED Notes (Signed)
Pt complains of chest pain and upper abd pains. Pt states these pains started yesterday at 1700 hrs.

## 2018-02-05 NOTE — ED Provider Notes (Signed)
TIME SEEN: 4:34 AM  CHIEF COMPLAINT: Pain, abdominal pain chest pain, abdominal pain  HPI: Patient is a 62 year old female with history of hypertension, diabetes, stroke, CAD with stent who presents to the emergency department with 4 days of constant chest pain and now 2 days of right upper quadrant and epigastric abdominal pain.  Has felt short of breath.  No nausea, vomiting, diarrhea.  No diaphoresis or dizziness.  No fever.  Has had some cough.  States that this feels somewhat similar to her previous anginal equivalent.  No history of abdominal surgery.  No aggravating or alleviating factors.  ROS: See HPI Constitutional: no fever  Eyes: no drainage  ENT: no runny nose   Cardiovascular:   chest pain  Resp:  SOB  GI: no vomiting GU: no dysuria Integumentary: no rash  Allergy: no hives  Musculoskeletal: no leg swelling  Neurological: no slurred speech ROS otherwise negative  PAST MEDICAL HISTORY/PAST SURGICAL HISTORY:  Past Medical History:  Diagnosis Date  . Acid reflux   . Anxiety   . Asthma   . Degenerative disc disease   . Depression   . Diabetes mellitus   . Hypertension   . Hypoglycemia 12/30/2016  . Stroke [facility])    04/22/12    MEDICATIONS:  Prior to Admission medications   Medication Sig Start Date End Date Taking? Authorizing Provider  ACCU-CHEK AVIVA PLUS test strip 1 each as needed. 10/14/16   [provider]  albuterol (PROVENTIL HFA;VENTOLIN HFA) 108 (90 Base) MCG/ACT inhaler Inhale 2 puffs into the lungs every 6 (six) hours as needed for wheezing or shortness of breath.    [provider]  amLODipine (NORVASC) 10 MG tablet Take 1 tablet (10 mg total) by mouth daily. 01/29/17 05/14/17  Abelino Derrick, PA-C  aspirin 81 MG chewable tablet Chew 1 tablet (81 mg total) by mouth daily. 11/22/15   Zannie Cove, MD  atorvastatin (LIPITOR) 80 MG tablet TAKE 1 TABLET EVERY DAY AT 6 PM 02/13/17   [name], MD  B-D ULTRAFINE III SHORT PEN 31G X 8 MM  MISC 1 each as needed. 11/18/16   [provider]  buPROPion (WELLBUTRIN SR) 150 MG 12 hr tablet Take 150 mg by mouth daily. 12/12/16   [provider]  carvedilol (COREG) 12.5 MG tablet TAKE 1 TABLET (12.5 MG TOTAL) BY MOUTH 2 (TWO) TIMES DAILY WITH A MEAL. NEED OV. 11/13/17   [name], MD  cloNIDine (CATAPRES) 0.2 MG tablet Take 0.2 mg by mouth daily as needed.     [provider]  clopidogrel (PLAVIX) 75 MG tablet Take 1 tablet (75 mg total) by mouth daily. 10/13/17   [name], MD  Cyanocobalamin (B-12) 1000 MCG CAPS Take 1 tablet by mouth daily.    [provider]  gabapentin (NEURONTIN) 300 MG capsule Take 300 mg by mouth 3 (three) times daily.     [provider]  insulin glargine (LANTUS) 100 UNIT/ML injection Inject 0.2 mLs (20 Units total) into the skin 2 (two) times daily. Patient taking differently: Inject 20 Units into the skin 2 (two) times daily. Patient takes 50 units in the morning and 40 units at night 12/31/16   Marlin Canary U, DO  levorphanol (LEVODROMORAN) 2 MG tablet Take 2 mg by mouth 2 (two) times daily. 09/17/17   [provider]  lisinopril (PRINIVIL,ZESTRIL) 40 MG tablet Take 1 tablet (40 mg total) by mouth at bedtime. 05/14/17   [name], MD  metFORMIN (GLUMETZA) 1000 MG (MOD) 24 hr tablet Take 1 tablet (1,000 mg total) by mouth 2 (two) times daily with a meal. Restart tomorrow 11/23/15   Zannie Cove, MD  MOVANTIK 25 MG TABS tablet Take 25 mg by mouth every morning. 12/12/16   [provider]  nicotine (NICODERM CQ - DOSED IN MG/24 HOURS) 14 mg/24hr patch Place 1 patch (14 mg total) onto the skin daily. 05/14/17   [name], MD  nicotine polacrilex ([facility] NICOTINE) 2 MG gum Take 1 each (2 mg total) by mouth as needed for smoking cessation. 05/14/17   [name], MD  Omega-3 Fatty Acids (FISH OIL) 1000 MG CAPS Take 1 capsule by mouth daily as needed (for vitamin).     [provider]  oxyCODONE (OXY IR/ROXICODONE) 5 MG immediate release tablet Take 10 mg by mouth 3 (three) times daily as needed (pain).     Schinlever, Santina Evans, PA-C  saxagliptin HCl (ONGLYZA) 2.5 MG TABS tablet Take 5 mg by mouth every morning.    [provider]  Semaglutide (OZEMPIC) 0.25 or 0.5 MG/DOSE SOPN Inject 0.5 Units into the skin once a week.    [provider]  traZODone (DESYREL) 50 MG tablet Take 50 mg by mouth at bedtime as needed for sleep.     [provider]  Vitamin D, Ergocalciferol, (DRISDOL) 50000 units CAPS capsule Take 50,000 Units by mouth every 7 (seven) days. monday    [provider]    ALLERGIES:  No Known Allergies  SOCIAL HISTORY:  Social History   Tobacco Use  . Smoking status: Current Every Day Smoker    Packs/day: 0.25    Years: 45.00    Pack years: 11.25    Types: Cigarettes  . Smokeless tobacco: Never Used  Substance Use Topics  . Alcohol use: No    FAMILY HISTORY: Family History  Problem Relation Age of Onset  . Hypertension Other     EXAM: BP (!) 170/81 (BP Location: Right Arm)   Pulse 85   Temp 98.2 F (36.8 C) (Oral)   Resp 16   SpO2 96%  CONSTITUTIONAL: Alert and oriented and responds appropriately to questions. Well-appearing; well-nourished HEAD: Normocephalic EYES: Conjunctivae clear, pupils appear equal, EOMI ENT: normal nose; moist mucous membranes NECK: Supple, no meningismus, no nuchal rigidity, no LAD  CARD: RRR; S1 and S2 appreciated; no murmurs, no clicks, no rubs, no gallops RESP: Normal chest excursion without splinting or tachypnea; breath sounds clear and equal bilaterally; no wheezes, no rhonchi, no rales, no hypoxia or respiratory distress, speaking full sentences ABD/GI: Normal bowel sounds; non-distended; soft, tender to palpation in the right upper quadrant and epigastric region, no rebound, no guarding, no peritoneal signs, no hepatosplenomegaly BACK:  The back appears  normal and is non-tender to palpation, there is no CVA tenderness EXT: Normal ROM in all joints; non-tender to palpation; no edema; normal capillary refill; no cyanosis, no calf tenderness or swelling    SKIN: Normal color for age and race; warm; no rash NEURO: Moves all extremities equally PSYCH: The patient's mood and manner are appropriate. Grooming and personal hygiene are appropriate.  MEDICAL DECISION MAKING: Patient here with chest pain that has been constant for 4 days and now goes into her abdomen.  Differential includes ACS, cholelithiasis, cholecystitis, pancreatitis.  Less likely dissection, PE.  First troponin negative.  We will add on LFTs, lipase and repeat second troponin.  EKG shows no ischemic changes.  Chest x-ray clear.  Will obtain right upper quadrant ultrasound.  Will give pain and nausea medicine.  ED PROGRESS: Second troponin negative.  LFTs normal.  Patient does have a lipase of 128.  Will give IV fluids.  Echocardiogram in 2017 showed EF of 65 to 70% with grade 1 diastolic dysfunction.   Patient's right upper quadrant ultrasound shows no acute abnormality.  Pain is well controlled after 1 dose of morphine and she has not had any vomiting.  She feels comfortable with plan for discharge home with pain medication and nausea medicine and close follow-up with her PCP.  Recommended a liquid diet over the next 3 days and then slowly advancing.  Recommend she avoid fatty, greasy foods.  Doubt ACS, PE, dissection.   At this time, I do not feel there is any life-threatening condition present. I have reviewed and discussed all results (EKG, imaging, lab, urine as appropriate) and exam findings with patient/family. I have reviewed nursing notes and appropriate previous records.  I feel the patient is safe to be discharged home without further emergent workup and can continue workup as an outpatient as needed. Discussed usual and customary return precautions. Patient/family verbalize  understanding and are comfortable with this plan.  Outpatient follow-up has been provided if needed. All questions have been answered.   EKG Interpretation  Date/Time:  Friday February 05 2018 04:14:16 EDT Ventricular Rate:  82 PR Interval:  164 QRS Duration: 90 QT Interval:  366 QTC Calculation: 427 R Axis:   23 Text Interpretation:  Normal sinus rhythm RSR' or QR pattern in V1 suggests right ventricular conduction delay Nonspecific T wave abnormality Abnormal ECG No significant change since last tracing Confirmed by Ward, Baxter Hire [phone]) on 02/05/2018 4:34:45 AM         Ward, Layla Maw, DO 02/05/18 6045

## 2018-02-05 NOTE — ED Notes (Signed)
Patient transported to Ultrasound 

## 2018-02-05 NOTE — ED Notes (Signed)
Pt vital signs updated  

## 2018-02-05 NOTE — ED Notes (Signed)
Miranda [name] was in the lobby the whole time would not answer at all when call x4 times for room and reassess vitalsigns.came up to the desk wanting to know why ant no one called her.i explain to her she had been call x4 .she stated so that what that was.

## 2018-02-05 NOTE — ED Notes (Signed)
Pt unable to sign for d/c due to hallway computer not having signature pad. D/C instructions and prescriptions reviewed with patient. Pt escorted to lobby in wheelchair, family picking pt up at entrance.

## 2018-02-05 NOTE — Discharge Instructions (Signed)
I recommend a liquid diet for the next 3 days and then slowly advance her diet.  Please avoid fatty, greasy foods.  Please follow-up closely with your primary care physician.  Please avoid alcohol.

## 2018-02-07 ENCOUNTER — Encounter (HOSPITAL_COMMUNITY): Payer: Self-pay | Admitting: *Deleted

## 2018-02-07 ENCOUNTER — Emergency Department (HOSPITAL_COMMUNITY): Payer: Medicaid Other

## 2018-02-07 ENCOUNTER — Other Ambulatory Visit: Payer: Self-pay

## 2018-02-07 ENCOUNTER — Inpatient Hospital Stay (HOSPITAL_COMMUNITY)
Admission: EM | Admit: 2018-02-07 | Discharge: 2018-02-11 | DRG: 439 | Disposition: A | Discharge status: Home / Self Care | Payer: Medicaid Other | Attending: Internal Medicine | Admitting: Internal Medicine

## 2018-02-07 DIAGNOSIS — I110 Hypertensive heart disease with heart failure: Secondary | ICD-10-CM | POA: Diagnosis present

## 2018-02-07 DIAGNOSIS — M545 Low back pain, unspecified: Secondary | ICD-10-CM

## 2018-02-07 DIAGNOSIS — I2510 Atherosclerotic heart disease of native coronary artery without angina pectoris: Secondary | ICD-10-CM

## 2018-02-07 DIAGNOSIS — Z720 Tobacco use: Secondary | ICD-10-CM | POA: Diagnosis not present

## 2018-02-07 DIAGNOSIS — G8929 Other chronic pain: Secondary | ICD-10-CM | POA: Diagnosis present

## 2018-02-07 DIAGNOSIS — K859 Acute pancreatitis without necrosis or infection, unspecified: Secondary | ICD-10-CM | POA: Diagnosis present

## 2018-02-07 DIAGNOSIS — Z794 Long term (current) use of insulin: Secondary | ICD-10-CM | POA: Diagnosis not present

## 2018-02-07 DIAGNOSIS — K5900 Constipation, unspecified: Secondary | ICD-10-CM | POA: Diagnosis not present

## 2018-02-07 DIAGNOSIS — Z7902 Long term (current) use of antithrombotics/antiplatelets: Secondary | ICD-10-CM

## 2018-02-07 DIAGNOSIS — K5909 Other constipation: Secondary | ICD-10-CM | POA: Diagnosis present

## 2018-02-07 DIAGNOSIS — Z79899 Other long term (current) drug therapy: Secondary | ICD-10-CM | POA: Diagnosis not present

## 2018-02-07 DIAGNOSIS — E785 Hyperlipidemia, unspecified: Secondary | ICD-10-CM | POA: Diagnosis present

## 2018-02-07 DIAGNOSIS — M5440 Lumbago with sciatica, unspecified side: Secondary | ICD-10-CM | POA: Diagnosis not present

## 2018-02-07 DIAGNOSIS — K8500 Idiopathic acute pancreatitis without necrosis or infection: Secondary | ICD-10-CM | POA: Diagnosis present

## 2018-02-07 DIAGNOSIS — Z7984 Long term (current) use of oral hypoglycemic drugs: Secondary | ICD-10-CM | POA: Diagnosis not present

## 2018-02-07 DIAGNOSIS — Z8673 Personal history of transient ischemic attack (TIA), and cerebral infarction without residual deficits: Secondary | ICD-10-CM | POA: Diagnosis not present

## 2018-02-07 DIAGNOSIS — G6181 Chronic inflammatory demyelinating polyneuritis: Secondary | ICD-10-CM | POA: Diagnosis not present

## 2018-02-07 DIAGNOSIS — J45909 Unspecified asthma, uncomplicated: Secondary | ICD-10-CM | POA: Diagnosis present

## 2018-02-07 DIAGNOSIS — Z9861 Coronary angioplasty status: Secondary | ICD-10-CM | POA: Diagnosis not present

## 2018-02-07 DIAGNOSIS — E1142 Type 2 diabetes mellitus with diabetic polyneuropathy: Secondary | ICD-10-CM | POA: Diagnosis present

## 2018-02-07 DIAGNOSIS — F17210 Nicotine dependence, cigarettes, uncomplicated: Secondary | ICD-10-CM | POA: Diagnosis present

## 2018-02-07 DIAGNOSIS — I5032 Chronic diastolic (congestive) heart failure: Secondary | ICD-10-CM | POA: Diagnosis present

## 2018-02-07 DIAGNOSIS — F329 Major depressive disorder, single episode, unspecified: Secondary | ICD-10-CM | POA: Diagnosis present

## 2018-02-07 DIAGNOSIS — E119 Type 2 diabetes mellitus without complications: Secondary | ICD-10-CM

## 2018-02-07 DIAGNOSIS — IMO0001 Reserved for inherently not codable concepts without codable children: Secondary | ICD-10-CM

## 2018-02-07 DIAGNOSIS — T383X5A Adverse effect of insulin and oral hypoglycemic [antidiabetic] drugs, initial encounter: Secondary | ICD-10-CM | POA: Diagnosis present

## 2018-02-07 DIAGNOSIS — M549 Dorsalgia, unspecified: Secondary | ICD-10-CM

## 2018-02-07 DIAGNOSIS — I10 Essential (primary) hypertension: Secondary | ICD-10-CM | POA: Diagnosis present

## 2018-02-07 DIAGNOSIS — G629 Polyneuropathy, unspecified: Secondary | ICD-10-CM

## 2018-02-07 LAB — COMPREHENSIVE METABOLIC PANEL
ALK PHOS: 134 U/L — AB (ref 38–126)
ALT: 11 U/L (ref 0–44)
ANION GAP: 8 (ref 5–15)
AST: 11 U/L — ABNORMAL LOW (ref 15–41)
Albumin: 3.8 g/dL (ref 3.5–5.0)
BUN: 13 mg/dL (ref 8–23)
CALCIUM: 9.2 mg/dL (ref 8.9–10.3)
CHLORIDE: 103 mmol/L (ref 98–111)
CO2: 29 mmol/L (ref 22–32)
Creatinine, Ser: 0.85 mg/dL (ref 0.44–1.00)
GFR calc non Af Amer: >60 mL/min (ref >60)
Glucose, Bld: 185 mg/dL — ABNORMAL HIGH (ref 70–99)
POTASSIUM: 3.4 mmol/L — AB (ref 3.5–5.1)
SODIUM: 140 mmol/L (ref 135–145)
Total Bilirubin: 0.4 mg/dL (ref 0.3–1.2)
Total Protein: 7.6 g/dL (ref 6.5–8.1)

## 2018-02-07 LAB — URINALYSIS, ROUTINE W REFLEX MICROSCOPIC
Bilirubin Urine: NEGATIVE
Glucose, UA: 50 mg/dL — AB
HGB URINE DIPSTICK: NEGATIVE
KETONES UR: NEGATIVE mg/dL
Leukocytes, UA: NEGATIVE
NITRITE: NEGATIVE
PH: 6 (ref 5.0–8.0)
Protein, ur: NEGATIVE mg/dL
SPECIFIC GRAVITY, URINE: 1.008 (ref 1.005–1.030)

## 2018-02-07 LAB — CBC
HCT: 45 % (ref 36.0–46.0)
Hemoglobin: 13.5 g/dL (ref 12.0–15.0)
MCH: 27.3 pg (ref 26.0–34.0)
MCHC: 30 g/dL (ref 30.0–36.0)
MCV: 90.9 fL (ref 80.0–100.0)
NRBC: 0 % (ref 0.0–0.2)
PLATELETS: 315 10*3/uL (ref 150–400)
RBC: 4.95 MIL/uL (ref 3.87–5.11)
RDW: 13.8 % (ref 11.5–15.5)
WBC: 8.6 10*3/uL (ref 4.0–10.5)

## 2018-02-07 LAB — GLUCOSE, CAPILLARY
GLUCOSE-CAPILLARY: 124 mg/dL — AB (ref 70–99)
Glucose-Capillary: 121 mg/dL — ABNORMAL HIGH (ref 70–99)

## 2018-02-07 LAB — LIPASE, BLOOD: LIPASE: 71 U/L — AB (ref 11–51)

## 2018-02-07 MED ORDER — HYDROMORPHONE HCL 1 MG/ML IJ SOLN
1.0000 mg | Freq: Once | INTRAMUSCULAR | Status: AC
  Administered 2018-02-07: 1 mg via INTRAVENOUS
  Filled 2018-02-07: qty 1

## 2018-02-07 MED ORDER — GABAPENTIN 300 MG PO CAPS
300.0000 mg | ORAL_CAPSULE | Freq: Three times a day (TID) | ORAL | Status: DC
  Administered 2018-02-07 – 2018-02-11 (×12): 300 mg via ORAL
  Filled 2018-02-07 (×12): qty 1

## 2018-02-07 MED ORDER — HYDROMORPHONE HCL 1 MG/ML IJ SOLN
0.5000 mg | INTRAMUSCULAR | Status: DC | PRN
  Administered 2018-02-07 – 2018-02-09 (×6): 0.5 mg via INTRAVENOUS
  Filled 2018-02-07 (×6): qty 0.5

## 2018-02-07 MED ORDER — AMLODIPINE BESYLATE 5 MG PO TABS
10.0000 mg | ORAL_TABLET | Freq: Every day | ORAL | Status: DC
  Administered 2018-02-07 – 2018-02-10 (×4): 10 mg via ORAL
  Filled 2018-02-07 (×4): qty 2

## 2018-02-07 MED ORDER — SODIUM CHLORIDE 0.9 % IV BOLUS
1000.0000 mL | Freq: Once | INTRAVENOUS | Status: AC
  Administered 2018-02-07: 1000 mL via INTRAVENOUS

## 2018-02-07 MED ORDER — NICOTINE 7 MG/24HR TD PT24
7.0000 mg | MEDICATED_PATCH | Freq: Every day | TRANSDERMAL | Status: DC
  Administered 2018-02-07 – 2018-02-11 (×4): 7 mg via TRANSDERMAL
  Filled 2018-02-07 (×5): qty 1

## 2018-02-07 MED ORDER — SODIUM CHLORIDE 0.45 % IV SOLN
INTRAVENOUS | Status: DC
  Administered 2018-02-07 (×2): via INTRAVENOUS

## 2018-02-07 MED ORDER — ALBUTEROL SULFATE (2.5 MG/3ML) 0.083% IN NEBU: 2.5000 mg | INHALATION_SOLUTION | Freq: Four times a day (QID) | RESPIRATORY_TRACT | Status: DC | PRN

## 2018-02-07 MED ORDER — CLONIDINE HCL 0.1 MG PO TABS
0.2000 mg | ORAL_TABLET | Freq: Every day | ORAL | Status: DC
  Administered 2018-02-07 – 2018-02-10 (×4): 0.2 mg via ORAL
  Filled 2018-02-07 (×4): qty 2

## 2018-02-07 MED ORDER — NALOXEGOL OXALATE 25 MG PO TABS: 25.0000 mg | ORAL_TABLET | Freq: Every day | ORAL | Status: DC

## 2018-02-07 MED ORDER — LISINOPRIL 20 MG PO TABS
40.0000 mg | ORAL_TABLET | Freq: Every day | ORAL | Status: DC
  Administered 2018-02-07 – 2018-02-09 (×3): 40 mg via ORAL
  Filled 2018-02-07 (×4): qty 2

## 2018-02-07 MED ORDER — INSULIN ASPART 100 UNIT/ML ~~LOC~~ SOLN
0.0000 [IU] | Freq: Three times a day (TID) | SUBCUTANEOUS | Status: DC
  Administered 2018-02-08: 2 [IU] via SUBCUTANEOUS
  Administered 2018-02-10: 8 [IU] via SUBCUTANEOUS
  Administered 2018-02-11: 2 [IU] via SUBCUTANEOUS
  Administered 2018-02-11: 3 [IU] via SUBCUTANEOUS

## 2018-02-07 MED ORDER — SODIUM CHLORIDE 0.9 % IV SOLN
INTRAVENOUS | Status: DC
  Administered 2018-02-07: 13:00:00 via INTRAVENOUS

## 2018-02-07 MED ORDER — CLOPIDOGREL BISULFATE 75 MG PO TABS
75.0000 mg | ORAL_TABLET | Freq: Every day | ORAL | Status: DC
  Administered 2018-02-07 – 2018-02-11 (×5): 75 mg via ORAL
  Filled 2018-02-07 (×5): qty 1

## 2018-02-07 MED ORDER — FAMOTIDINE 20 MG PO TABS
20.0000 mg | ORAL_TABLET | Freq: Once | ORAL | Status: AC
  Administered 2018-02-07: 20 mg via ORAL
  Filled 2018-02-07: qty 1

## 2018-02-07 MED ORDER — LORAZEPAM 2 MG/ML IJ SOLN
0.5000 mg | Freq: Once | INTRAMUSCULAR | Status: AC
  Administered 2018-02-07: 0.5 mg via INTRAVENOUS
  Filled 2018-02-07: qty 1

## 2018-02-07 MED ORDER — IOPAMIDOL (ISOVUE-300) INJECTION 61%
INTRAVENOUS | Status: AC
  Filled 2018-02-07: qty 100

## 2018-02-07 MED ORDER — CARVEDILOL 12.5 MG PO TABS
12.5000 mg | ORAL_TABLET | Freq: Two times a day (BID) | ORAL | Status: DC
  Administered 2018-02-07 – 2018-02-11 (×8): 12.5 mg via ORAL
  Filled 2018-02-07 (×8): qty 1

## 2018-02-07 MED ORDER — IOPAMIDOL (ISOVUE-300) INJECTION 61%
100.0000 mL | Freq: Once | INTRAVENOUS | Status: AC | PRN
  Administered 2018-02-07: 100 mL via INTRAVENOUS

## 2018-02-07 MED ORDER — SODIUM CHLORIDE 0.9 % IJ SOLN
INTRAMUSCULAR | Status: AC
  Filled 2018-02-07: qty 50

## 2018-02-07 MED ORDER — ENOXAPARIN SODIUM 40 MG/0.4ML ~~LOC~~ SOLN
40.0000 mg | Freq: Every day | SUBCUTANEOUS | Status: DC
  Administered 2018-02-08 – 2018-02-09 (×2): 40 mg via SUBCUTANEOUS
  Filled 2018-02-07 (×2): qty 0.4

## 2018-02-07 MED ORDER — TRAZODONE HCL 50 MG PO TABS
50.0000 mg | ORAL_TABLET | Freq: Once | ORAL | Status: AC
  Administered 2018-02-07: 50 mg via ORAL
  Filled 2018-02-07: qty 1

## 2018-02-07 MED ORDER — ATORVASTATIN CALCIUM 80 MG PO TABS
80.0000 mg | ORAL_TABLET | Freq: Every day | ORAL | Status: DC
  Administered 2018-02-07 – 2018-02-10 (×4): 80 mg via ORAL
  Filled 2018-02-07: qty 2
  Filled 2018-02-07: qty 1
  Filled 2018-02-07 (×2): qty 2
  Filled 2018-02-07 (×2): qty 1
  Filled 2018-02-07: qty 2
  Filled 2018-02-07 (×2): qty 1
  Filled 2018-02-07: qty 2

## 2018-02-07 NOTE — ED Notes (Signed)
ED TO INPATIENT HANDOFF REPORT  Name/Age/Gender Miranda [name] 62 y.o. female  Code Status    Code Status Orders  (From admission, onward)         Start     Ordered   02/07/18 0927  Full code  Continuous     02/07/18 0927        Code Status History    Date Active Date Inactive Code Status Order ID Comments User Context   12/30/2016 0550 01/01/2017 0137 Full Code [id number]  Rise Patience, MD ED   11/21/2015 0222 11/22/2015 1556 Full Code [id number]  Rise Patience, MD Inpatient   05/31/2014 2039 06/01/2014 0109 Full Code [id number]  Ezequiel Essex, MD ED      Home/SNF/Other Home  Chief Complaint abdominal pain  Level of Care/Admitting Diagnosis ED Disposition    ED Disposition Condition [facility] Area: [facility] [[id number]]  Level of Care: Med-Surg [16]  Diagnosis: Pancreatitis [[id number]]  Admitting Physician: Georgette Shell [[id number]]  Attending Physician: Georgette Shell [[id number]]  Estimated length of stay: 3 - 4 days  Certification:: I certify this patient will need inpatient services for at least 2 midnights  PT Class (Do Not Modify): Inpatient [101]  PT Acc Code (Do Not Modify): Private [1]       Medical History Past Medical History:  Diagnosis Date  . Acid reflux   . Anxiety   . Asthma   . Degenerative disc disease   . Depression   . Diabetes mellitus   . Hypertension   . Hypoglycemia 12/30/2016  . Stroke ([place])    04/22/12    Allergies No Known Allergies  IV Location/Drains/Wounds Patient Lines/Drains/Airways Status   Active Line/Drains/Airways    Name:   Placement date:   Placement time:   Site:   Days:   Peripheral IV 02/07/18 Left Antecubital   02/07/18    0450    Antecubital   less than 1          Labs/Imaging Results for orders placed or performed during the hospital encounter of 02/07/18 (from the past 48 hour(s))  Lipase, blood     Status: Abnormal   Collection Time: 02/07/18   5:09 AM  Result Value Ref Range   Lipase 71 (H) 11 - 51 U/L    Comment: Performed at [facility], [place] [address] [place], [place] [address]  Comprehensive metabolic panel     Status: Abnormal   Collection Time: 02/07/18  5:09 AM  Result Value Ref Range   Sodium 140 135 - 145 mmol/L   Potassium 3.4 (L) 3.5 - 5.1 mmol/L   Chloride 103 98 - 111 mmol/L   CO2 29 22 - 32 mmol/L   Glucose, Bld 185 (H) 70 - 99 mg/dL   BUN 13 8 - 23 mg/dL   Creatinine, Ser 0.85 0.44 - 1.00 mg/dL   Calcium 9.2 8.9 - 10.3 mg/dL   Total Protein 7.6 6.5 - 8.1 g/dL   Albumin 3.8 3.5 - 5.0 g/dL   AST 11 (L) 15 - 41 U/L   ALT 11 0 - 44 U/L   Alkaline Phosphatase 134 (H) 38 - 126 U/L   Total Bilirubin 0.4 0.3 - 1.2 mg/dL   GFR calc non Af Amer >60 >60 mL/min   GFR calc Af Amer >60 >60 mL/min    Comment: (NOTE) The eGFR has been calculated using the CKD EPI equation. This calculation has not been  validated in all clinical situations. eGFR's persistently <60 mL/min signify possible Chronic Kidney Disease.    Anion gap 8 5 - 15    Comment: Performed at [facility], [place] [address] [place], [place] [address]  CBC     Status: None   Collection Time: 02/07/18  5:09 AM  Result Value Ref Range   WBC 8.6 4.0 - 10.5 K/uL   RBC 4.95 3.87 - 5.11 MIL/uL   Hemoglobin 13.5 12.0 - 15.0 g/dL   HCT 45.0 36.0 - 46.0 %   MCV 90.9 80.0 - 100.0 fL   MCH 27.3 26.0 - 34.0 pg   MCHC 30.0 30.0 - 36.0 g/dL   RDW 13.8 11.5 - 15.5 %   Platelets 315 150 - 400 K/uL   nRBC 0.0 0.0 - 0.2 %    Comment: Performed at [facility], [place] [address], [place], [place] [address]  Urinalysis, Routine w reflex microscopic     Status: Abnormal   Collection Time: 02/07/18  7:05 AM  Result Value Ref Range   Color, Urine YELLOW YELLOW   APPearance CLEAR CLEAR   Specific Gravity, Urine 1.008 1.005 - 1.030   pH 6.0 5.0 - 8.0   Glucose, UA 50 (A) NEGATIVE mg/dL   Hgb urine dipstick  NEGATIVE NEGATIVE   Bilirubin Urine NEGATIVE NEGATIVE   Ketones, ur NEGATIVE NEGATIVE mg/dL   Protein, ur NEGATIVE NEGATIVE mg/dL   Nitrite NEGATIVE NEGATIVE   Leukocytes, UA NEGATIVE NEGATIVE    Comment: Performed at [place] [address] [place], [place] [address]   Ct Abdomen Pelvis W Contrast  Result Date: 02/07/2018 CLINICAL DATA:  62 year old female with 4 days of chest pain, right upper quadrant and epigastric pain. Abdominal distension. EXAM: CT ABDOMEN AND PELVIS WITH CONTRAST TECHNIQUE: Multidetector CT imaging of the abdomen and pelvis was performed using the standard protocol following bolus administration of intravenous contrast. CONTRAST:  155m ISOVUE-300 IOPAMIDOL (ISOVUE-300) INJECTION 61% COMPARISON:  Lumbar MRI 01/24/2017. FINDINGS: Lower chest: Mild cardiomegaly. No pericardial effusion. No pleural effusion. Mild lung base atelectasis. Hepatobiliary: Negative liver and gallbladder. Pancreas: On series 2, image 35 at the level of the pancreatic head there is an indistinct appearance of the duodenum and suggestion of mild regional fat stranding. See also coronal image 68 where the 1st and 2nd portion of the duodenum appear indistinct. Pancreatic uncinate appears mildly lobulated on coronal image 60, and the uncinate seems smaller on prior lumbar MRIs in 2018, and also 2009. There is no peripancreatic lymphadenopathy, although there is mild dependent retroperitoneal stranding just caudal to the duodenum and a maximal retroperitoneal lymph node on series 2, image 53. The body and tail of the pancreas have a more normal appearance. Spleen: Negative. Adrenals/Urinary Tract: Normal adrenal glands. Bilateral renal enhancement and contrast excretion is symmetric and within normal limits. No perinephric stranding. No hydroureter.  Unremarkable urinary bladder. Stomach/Bowel: Negative rectum and distal sigmoid colon. The proximal sigmoid is redundant with moderate  diverticulosis which continues into the descending colon. No active inflammation is identified. Redundant transverse colon. Redundant hepatic flexure with retained stool. Retained stool in the right colon. Normal appendix on series 2, image 66. Negative terminal ileum. No dilated small bowel. There is lower rectus muscle diastasis (series 2, image 67) with subjacent small bowel and mesentery, but no overt herniation of bowel. Negative stomach. Duodenum described above. No abdominal free air, free fluid. Vascular/Lymphatic: Aortoiliac calcified atherosclerosis. Major arterial structures remain patent. Portal venous  system is patent. Reproductive: Negative. Other: No pelvic free fluid. Musculoskeletal: Lower lumbar facet degeneration. Bilateral L4-L5 and L5-S1 vacuum facet. No acute osseous abnormality identified. IMPRESSION: 1. Inflamed appearing proximal duodenum, and subtle inflammation about the pancreatic head on series 2, image 35. Mild Acute Pancreatitis suspected. 2. 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 RECOMMEND follow-up outpatient Abdomen MRI (pancreatic protocol without and with contrast) in 4-6 weeks to exclude the possibility of an uncinate tumor. 3. Diverticulosis of the left colon without active inflammation. Normal appendix. Aortic Atherosclerosis (ICD10-I70.0). Electronically Signed   By: Genevie Ann M.D.   On: 02/07/2018 08:15    Pending Labs Unresulted Labs (From admission, onward)    Start     Ordered   02/08/18 0500  Comprehensive metabolic panel  Tomorrow morning,   R     02/07/18 0927   02/08/18 0500  CBC  Tomorrow morning,   R     02/07/18 0927   02/08/18 0500  Lipase, blood  Tomorrow morning,   R     02/07/18 0928   02/07/18 0926  HIV antibody (Routine Testing)  Once,   R     02/07/18 0927          Vitals/Pain Today's Vitals   02/07/18 0500 02/07/18 0602 02/07/18 0702 02/07/18 0805  BP: (!) 144/85  (!) 167/74   Pulse: 80   87   Resp: 13  14   Temp:      TempSrc:      SpO2: 97%  93%   Weight:      Height:      PainSc:  10-Worst pain ever  9     Isolation Precautions No active isolations  Medications Medications  0.9 %  sodium chloride infusion (has no administration in time range)  iopamidol (ISOVUE-300) 61 % injection (has no administration in time range)  sodium chloride 0.9 % injection (has no administration in time range)  enoxaparin (LOVENOX) injection 40 mg (has no administration in time range)  0.45 % sodium chloride infusion (has no administration in time range)  sodium chloride 0.9 % bolus 1,000 mL (0 mLs Intravenous Stopped 02/07/18 0852)  HYDROmorphone (DILAUDID) injection 1 mg (1 mg Intravenous Given 02/07/18 0758)  LORazepam (ATIVAN) injection 0.5 mg (0.5 mg Intravenous Given 02/07/18 0758)  iopamidol (ISOVUE-300) 61 % injection 100 mL (100 mLs Intravenous Contrast Given 02/07/18 0726)    Mobility walks with device

## 2018-02-07 NOTE — ED Provider Notes (Signed)
[facility]-EMERGENCY DEPT Provider Note   CSN: [id number] Arrival date & time: 02/07/18  0227     History   Chief Complaint Chief Complaint  Patient presents with  . Abdominal Pain    HPI Miranda [name] is a 62 y.o. female.  62 year old female recently diagnosed with pancreatitis presents with recurrent epigastric pain.  Denies any fever or chills.  Has not had any emesis.  No black or bloody stools.  No anginal type symptoms.  No urinary symptoms.  Patient using Percocet at home without relief.  Nothing makes his symptoms better.  Has been using a liquid diet.     Past Medical History:  Diagnosis Date  . Acid reflux   . Anxiety   . Asthma   . Degenerative disc disease   . Depression   . Diabetes mellitus   . Hypertension   . Hypoglycemia 12/30/2016  . Stroke [facility])    04/22/12    Patient Active Problem List   Diagnosis Date Noted  . CAD S/P percutaneous coronary angioplasty 01/29/2017  . Tobacco abuse 01/29/2017  . Chronic back pain 01/29/2017  . Hypoglycemia 12/30/2016  . Insulin dependent diabetes mellitus (HCC) 11/21/2015  . Dyslipidemia 11/21/2015  . NSTEMI (non-ST elevated myocardial infarction) (HCC)   . TIA (transient ischemic attack) 05/31/2014  . History of stroke 04/22/2012  . Hypokalemia 04/22/2012  . Diabetes mellitus (HCC) 04/22/2012  . Essential hypertension 04/22/2012    Past Surgical History:  Procedure Laterality Date  . CARDIAC CATHETERIZATION N/A 11/21/2015   Procedure: Left Heart Cath and Coronary Angiography;  Surgeon: Peter M Swaziland, MD;  Location: [facility] INVASIVE CV LAB;  Service: Cardiovascular;  Laterality: N/A;  . CARDIAC CATHETERIZATION N/A 11/21/2015   Procedure: Coronary Stent Intervention;  Surgeon: Peter M Swaziland, MD;  Location: [facility] INVASIVE CV LAB;  Service: Cardiovascular;  Laterality: N/A;  . KNEE SURGERY    . TUBAL LIGATION       OB History   None      Home Medications    Prior to Admission  medications   Medication Sig Start Date End Date Taking? Authorizing Provider  albuterol (PROVENTIL HFA;VENTOLIN HFA) 108 (90 Base) MCG/ACT inhaler Inhale 2 puffs into the lungs every 6 (six) hours as needed for wheezing or shortness of breath.   Yes [provider]  amLODipine (NORVASC) 10 MG tablet Take 1 tablet (10 mg total) by mouth daily. 01/29/17 02/07/18 Yes Kilroy, Luke K, PA-C  atorvastatin (LIPITOR) 80 MG tablet TAKE 1 TABLET EVERY DAY AT 6 PM Patient taking differently: Take 80 mg by mouth daily at 6 PM.  02/13/17  Yes [name], MD  buPROPion [facility] SR) 150 MG 12 hr tablet Take 150 mg by mouth daily. 12/12/16  Yes [provider]  carvedilol (COREG) 12.5 MG tablet TAKE 1 TABLET (12.5 MG TOTAL) BY MOUTH 2 (TWO) TIMES DAILY WITH A MEAL. NEED OV. Patient taking differently: Take 12.5 mg by mouth 2 (two) times daily with a meal.  11/13/17  Yes [name], MD  cloNIDine (CATAPRES) 0.2 MG tablet Take 0.2 mg by mouth daily.    Yes [provider]  clopidogrel (PLAVIX) 75 MG tablet Take 1 tablet (75 mg total) by mouth daily. 10/13/17  Yes [name], MD  Cyanocobalamin (B-12) 1000 MCG CAPS Take 1,000 mcg by mouth daily.    Yes [provider]  gabapentin (NEURONTIN) 300 MG capsule Take 300 mg by mouth 3 (three) times daily.  Yes [provider]  insulin glargine (LANTUS) 100 UNIT/ML injection Inject 0.2 mLs (20 Units total) into the skin 2 (two) times daily. Patient taking differently: Inject 50 Units into the skin 2 (two) times daily.  12/31/16  Yes Vann, Jessica U, DO  lisinopril (PRINIVIL,ZESTRIL) 40 MG tablet Take 1 tablet (40 mg total) by mouth at bedtime. 05/14/17  Yes [name], MD  metFORMIN (GLUMETZA) 1000 MG (MOD) 24 hr tablet Take 1 tablet (1,000 mg total) by mouth 2 (two) times daily with a meal. Restart tomorrow 11/23/15  Yes Zannie Cove, MD  MOVANTIK 25 MG TABS tablet Take 25 mg by mouth every morning.  12/12/16  Yes [provider]  oxyCODONE-acetaminophen (PERCOCET) 7.5-325 MG tablet Take 1 tablet by mouth every 4 (four) hours as needed for severe pain.   Yes [provider]  saxagliptin HCl (ONGLYZA) 2.5 MG TABS tablet Take 5 mg by mouth every morning.   Yes [provider]  Semaglutide (OZEMPIC) 0.25 or 0.5 MG/DOSE SOPN Inject 0.5 Units into the skin once a week.   Yes [provider]  traZODone (DESYREL) 50 MG tablet Take 50 mg by mouth at bedtime as needed for sleep.    Yes [provider]  Vitamin D, Ergocalciferol, (DRISDOL) 50000 units CAPS capsule Take 50,000 Units by mouth every 7 (seven) days. monday   Yes [provider]  ACCU-CHEK AVIVA PLUS test strip 1 each as needed. 10/14/16   [provider]  aspirin 81 MG chewable tablet Chew 1 tablet (81 mg total) by mouth daily. Patient not taking: Reported on 02/07/2018 11/22/15   Zannie Cove, MD  B-D ULTRAFINE III SHORT PEN 31G X 8 MM MISC 1 each as needed. 11/18/16   [provider]  levorphanol (LEVODROMORAN) 2 MG tablet Take 2 mg by mouth 2 (two) times daily. 09/17/17   [provider]  nicotine (NICODERM CQ - DOSED IN MG/24 HOURS) 14 mg/24hr patch Place 1 patch (14 mg total) onto the skin daily. Patient not taking: Reported on 02/07/2018 05/14/17   [name], MD  nicotine polacrilex ([employer] NICOTINE) 2 MG gum Take 1 each (2 mg total) by mouth as needed for smoking cessation. Patient not taking: Reported on 02/07/2018 05/14/17   [name], MD  ondansetron (ZOFRAN ODT) 4 MG disintegrating tablet Take 1 tablet (4 mg total) by mouth every 6 (six) hours as needed. 02/05/18   Ward, Layla Maw, DO  oxyCODONE-acetaminophen (PERCOCET/ROXICET) 5-325 MG tablet Take 1 tablet by mouth every 4 (four) hours as needed. Patient not taking: Reported on 02/07/2018 02/05/18   Ward, Layla Maw, DO    Family History Family History  Problem Relation Age of Onset  .  Hypertension Other     Social History Social History   Tobacco Use  . Smoking status: Current Every Day Smoker    Packs/day: 0.25    Years: 45.00    Pack years: 11.25    Types: Cigarettes  . Smokeless tobacco: Never Used  Substance Use Topics  . Alcohol use: No  . Drug use: No     Allergies   Patient has no known allergies.   Review of Systems Review of Systems  All other systems reviewed and are negative.    Physical Exam Updated Vital Signs BP (!) 167/74 (BP Location: Left Arm)   Pulse 87   Temp 98.6 F (37 C) (Oral)   Resp 14   Ht 1.778 m (5\' 10" )   Wt  104.3 kg   SpO2 93%   BMI 33.00 kg/m   Physical Exam  Constitutional: She is oriented to person, place, and time. She appears well-developed and well-nourished.  Non-toxic appearance. No distress.  HENT:  Head: Normocephalic and atraumatic.  Eyes: Pupils are equal, round, and reactive to light. Conjunctivae, EOM and lids are normal.  Neck: Normal range of motion. Neck supple. No tracheal deviation present. No thyroid mass present.  Cardiovascular: Normal rate, regular rhythm and normal heart sounds. Exam reveals no gallop.  No murmur heard. Pulmonary/Chest: Effort normal and breath sounds normal. No stridor. No respiratory distress. She has no decreased breath sounds. She has no wheezes. She has no rhonchi. She has no rales.  Abdominal: Soft. Normal appearance and bowel sounds are normal. She exhibits no distension. There is tenderness in the right upper quadrant and epigastric area. There is no rigidity, no rebound, no guarding and no CVA tenderness.    Musculoskeletal: Normal range of motion. She exhibits no edema or tenderness.  Neurological: She is alert and oriented to person, place, and time. She has normal strength. No cranial nerve deficit or sensory deficit. GCS eye subscore is 4. GCS verbal subscore is 5. GCS motor subscore is 6.  Skin: Skin is warm and dry. No abrasion and no rash noted.    Psychiatric: She has a normal mood and affect. Her speech is normal and behavior is normal.  Nursing note and vitals reviewed.    ED Treatments / Results  Labs (all labs ordered are listed, but only abnormal results are displayed) Labs Reviewed  LIPASE, BLOOD - Abnormal; Notable for the following components:      Result Value   Lipase 71 (*)    All other components within normal limits  COMPREHENSIVE METABOLIC PANEL - Abnormal; Notable for the following components:   Potassium 3.4 (*)    Glucose, Bld 185 (*)    AST 11 (*)    Alkaline Phosphatase 134 (*)    All other components within normal limits  CBC  URINALYSIS, ROUTINE W REFLEX MICROSCOPIC    EKG None  Radiology No results found.  Procedures Procedures (including critical care time)  Medications Ordered in ED Medications  sodium chloride 0.9 % bolus 1,000 mL (has no administration in time range)  0.9 %  sodium chloride infusion (has no administration in time range)  HYDROmorphone (DILAUDID) injection 1 mg (has no administration in time range)  LORazepam (ATIVAN) injection 0.5 mg (has no administration in time range)     Initial Impression / Assessment and Plan / ED Course  I have reviewed the triage vital signs and the nursing notes.  Pertinent labs & imaging results that were available during my care of the patient were reviewed by me and considered in my medical decision making (see chart for details).     Patient given IV fluids and IV hydromorphone here.  Continues to complain of pain.  CT scan consistent with acute pancreatitis.  Discussed with Dr. Margo Aye from radiology about patient with possible mass in her pancreas and recommends MRI for 6 weeks.  Will consult hospitalist for admission for pancreatitis and relay importance of follow-up in 4 to 6 weeks to exclude possible cancer  Final Clinical Impressions(s) / ED Diagnoses   Final diagnoses:  None    ED Discharge Orders    None       Lorre Nick, MD 02/07/18 [id number]

## 2018-02-07 NOTE — ED Triage Notes (Signed)
Patient brought by [facility]. Patient is complaining of right upper abdominal pain. Patient went to [facility] two days ago and has been diagnosed with pancreatitis. Patient is complaining that percocet that were prescribed was not working.

## 2018-02-07 NOTE — H&P (Addendum)
History and Physical    Miranda [name] UEA:[id number] DOB: [birth year] DOA: 02/07/2018  PCP: [name], MD  Patient coming from: Home    Chief Complaint: Abdominal pain  HPI: Miranda [name] is a 62 y.o. female with medical history significant of type 2 diabetes, CAD, hypertension, stroke, admitted with complaints of abdominal pain that started last few days prior to admission.  Patient came to the emergency room 02/05/2018 with abdominal pain.  Work-up at that time include cardiac enzymes were negative LFTs normal lipase was 128 she was given IV fluids.  Echo in 2017 showed ejection fraction 65 to 70% with grade 1 diastolic dysfunction.  Right upper quadrant ultrasound showed no acute abnormality.  Since she did not have any vomiting and pain was controlled she was discharged home with pain medication and something to help with her nausea and and to asked her to follow-up with PCP and recommend to take a liquid diet over the next 3 days.  But then she returns again 24 hours later with the same complaint.  Patient reports weight loss of over 10 pounds in the last 1 month with decreased appetite and generalized weakness.  Patient does not work she is on disability.  She also complains of back pain.  No fever chills complains of nausea no vomiting no diarrhea no chest pain shortness of breath cough fever or chills.  CT scan of the abdomen showed inflamed appearing proximal duodenum inflammation about the pancreatic head and mild acute pancreatitis.  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  MRI recommended outpatient pancreatic protocol without and with contrast in 4 to 6 weeks to exclude the possibility of uncinate tumor.  Diverticulosis of the left colon without active inflammation noted. ED Course: CT scan as above.  Vital signs 1 67/74, pulse 80.  Review of Systems: See HPI.   Past Medical History:  Diagnosis Date  . Acid reflux   . Anxiety   . Asthma   .  Degenerative disc disease   . Depression   . Diabetes mellitus   . Hypertension   . Hypoglycemia 12/30/2016  . Stroke [facility])    04/22/12    Past Surgical History:  Procedure Laterality Date  . CARDIAC CATHETERIZATION N/A 11/21/2015   Procedure: Left Heart Cath and Coronary Angiography;  Surgeon: Peter M Swaziland, MD;  Location: [facility] INVASIVE CV LAB;  Service: Cardiovascular;  Laterality: N/A;  . CARDIAC CATHETERIZATION N/A 11/21/2015   Procedure: Coronary Stent Intervention;  Surgeon: Peter M Swaziland, MD;  Location: [facility] INVASIVE CV LAB;  Service: Cardiovascular;  Laterality: N/A;  . KNEE SURGERY    . TUBAL LIGATION       reports that she has been smoking cigarettes. She has a 11.25 pack-year smoking history. She has never used smokeless tobacco. She reports that she does not drink alcohol or use drugs.  No Known Allergies  Family History  Problem Relation Age of Onset  . Hypertension Other      Prior to Admission medications   Medication Sig Start Date End Date Taking? Authorizing Provider  albuterol (PROVENTIL HFA;VENTOLIN HFA) 108 (90 Base) MCG/ACT inhaler Inhale 2 puffs into the lungs every 6 (six) hours as needed for wheezing or shortness of breath.   Yes [provider]  amLODipine (NORVASC) 10 MG tablet Take 1 tablet (10 mg total) by mouth daily. 01/29/17 02/07/18 Yes Kilroy, Luke K, PA-C  atorvastatin (LIPITOR) 80 MG tablet TAKE 1 TABLET EVERY DAY AT 6 PM Patient  taking differently: Take 80 mg by mouth daily at 6 PM.  02/13/17  Yes [name], MD  buPROPion [facility] SR) 150 MG 12 hr tablet Take 150 mg by mouth daily. 12/12/16  Yes [provider]  carvedilol (COREG) 12.5 MG tablet TAKE 1 TABLET (12.5 MG TOTAL) BY MOUTH 2 (TWO) TIMES DAILY WITH A MEAL. NEED OV. Patient taking differently: Take 12.5 mg by mouth 2 (two) times daily with a meal.  11/13/17  Yes [name], MD  cloNIDine (CATAPRES) 0.2 MG tablet Take 0.2 mg by mouth daily.    Yes [provider]  clopidogrel (PLAVIX) 75 MG tablet Take 1 tablet (75 mg total) by mouth daily. 10/13/17  Yes [name], MD  Cyanocobalamin (B-12) 1000 MCG CAPS Take 1,000 mcg by mouth daily.    Yes [provider]  gabapentin (NEURONTIN) 300 MG capsule Take 300 mg by mouth 3 (three) times daily.    Yes [provider]  insulin glargine (LANTUS) 100 UNIT/ML injection Inject 0.2 mLs (20 Units total) into the skin 2 (two) times daily. Patient taking differently: Inject 50 Units into the skin 2 (two) times daily.  12/31/16  Yes Vann, Jessica U, DO  lisinopril (PRINIVIL,ZESTRIL) 40 MG tablet Take 1 tablet (40 mg total) by mouth at bedtime. 05/14/17  Yes [name], MD  metFORMIN (GLUMETZA) 1000 MG (MOD) 24 hr tablet Take 1 tablet (1,000 mg total) by mouth 2 (two) times daily with a meal. Restart tomorrow 11/23/15  Yes Zannie Cove, MD  MOVANTIK 25 MG TABS tablet Take 25 mg by mouth every morning. 12/12/16  Yes [provider]  oxyCODONE-acetaminophen (PERCOCET) 7.5-325 MG tablet Take 1 tablet by mouth every 4 (four) hours as needed for severe pain.   Yes [provider]  saxagliptin HCl (ONGLYZA) 2.5 MG TABS tablet Take 5 mg by mouth every morning.   Yes [provider]  Semaglutide (OZEMPIC) 0.25 or 0.5 MG/DOSE SOPN Inject 0.5 Units into the skin once a week.   Yes [provider]  traZODone (DESYREL) 50 MG tablet Take 50 mg by mouth at bedtime as needed for sleep.    Yes [provider]  Vitamin D, Ergocalciferol, (DRISDOL) 50000 units CAPS capsule Take 50,000 Units by mouth every 7 (seven) days. monday   Yes [provider]  ACCU-CHEK AVIVA PLUS test strip 1 each as needed. 10/14/16   [provider]  aspirin 81 MG chewable tablet Chew 1 tablet (81 mg total) by mouth daily. Patient not taking: Reported on 02/07/2018 11/22/15   Zannie Cove, MD  B-D ULTRAFINE III SHORT PEN 31G X 8 MM MISC 1 each as needed.  11/18/16   [provider]  levorphanol (LEVODROMORAN) 2 MG tablet Take 2 mg by mouth 2 (two) times daily. 09/17/17   [provider]  nicotine (NICODERM CQ - DOSED IN MG/24 HOURS) 14 mg/24hr patch Place 1 patch (14 mg total) onto the skin daily. Patient not taking: Reported on 02/07/2018 05/14/17   [name], MD  nicotine polacrilex ([employer] NICOTINE) 2 MG gum Take 1 each (2 mg total) by mouth as needed for smoking cessation. Patient not taking: Reported on 02/07/2018 05/14/17   [name], MD  ondansetron (ZOFRAN ODT) 4 MG disintegrating tablet Take 1 tablet (4 mg total) by mouth every 6 (six) hours as needed. 02/05/18   Ward, Layla Maw, DO  oxyCODONE-acetaminophen (PERCOCET/ROXICET) 5-325 MG tablet Take 1 tablet by mouth every 4 (  four) hours as needed. Patient not taking: Reported on 02/07/2018 02/05/18   Ward, Layla Maw, DO    Physical Exam: Vitals:   02/07/18 0323 02/07/18 0329 02/07/18 0500 02/07/18 0702  BP: (!) 144/83  (!) 144/85 (!) 167/74  Pulse: 82  80 87  Resp: 18  13 14   Temp: 98.6 F (37 C)     TempSrc: Oral     SpO2: 97%  97% 93%  Weight:  104.3 kg    Height:  5\' 10"  (1.778 m)      Constitutional: NAD, calm, comfortable Vitals:   02/07/18 0323 02/07/18 0329 02/07/18 0500 02/07/18 0702  BP: (!) 144/83  (!) 144/85 (!) 167/74  Pulse: 82  80 87  Resp: 18  13 14   Temp: 98.6 F (37 C)     TempSrc: Oral     SpO2: 97%  97% 93%  Weight:  104.3 kg    Height:  5\' 10"  (1.778 m)     Eyes: PERRL, lids and conjunctivae normal ENMT: Mucous membranes are moist. Posterior pharynx clear of any exudate or lesions.Normal dentition.  Neck: normal, supple, no masses, no thyromegaly Respiratory: clear to auscultation bilaterally, no wheezing, no crackles. Normal respiratory effort. No accessory muscle use.  Cardiovascular: Regular rate and rhythm, no murmurs / rubs / gallops. No extremity edema. 2+ pedal pulses. No carotid bruits.  Abdomen: DISTENDED SOFT MILD  GENERALIZED TENDERNESS no masses palpated. No hepatosplenomegaly. Bowel sounds positive.  Musculoskeletal: no clubbing / cyanosis. No joint deformity upper and lower extremities. Good ROM, no contractures. Normal muscle tone.  Skin: no rashes, lesions, ulcers. No induration Neurologic: CN 2-12 grossly intact. Sensation intact, DTR normal. Strength 5/5 in all 4.  Psychiatric: Normal judgment and insight. Alert and oriented x 3. Normal mood.    Labs on Admission: I have personally reviewed following labs and imaging studies  CBC: Recent Labs  Lab 02/04/18 2006 02/07/18 0509  WBC 8.6 8.6  HGB 13.4 13.5  HCT 44.2 45.0  MCV 90.4 90.9  PLT 332 315   Basic Metabolic Panel: Recent Labs  Lab 02/04/18 2006 02/07/18 0509  NA 143 140  K 3.5 3.4*  CL 103 103  CO2 29 29  GLUCOSE 254* 185*  BUN 10 13  CREATININE 0.76 0.85  CALCIUM 9.4 9.2   GFR: Estimated Creatinine Clearance: 90.9 mL/min (by C-G formula based on SCr of 0.85 mg/dL). Liver Function Tests: Recent Labs  Lab 02/05/18 0514 02/07/18 0509  AST 12* 11*  ALT 12 11  ALKPHOS 120 134*  BILITOT 0.3 0.4  PROT 7.0 7.6  ALBUMIN 3.5 3.8   Recent Labs  Lab 02/05/18 0514 02/07/18 0509  LIPASE 128* 71*   No results for input(s): AMMONIA in the last 168 hours. Coagulation Profile: No results for input(s): INR, PROTIME in the last 168 hours. Cardiac Enzymes: No results for input(s): CKTOTAL, CKMB, CKMBINDEX, TROPONINI in the last 168 hours. BNP (last 3 results) No results for input(s): PROBNP in the last 8760 hours. HbA1C: No results for input(s): HGBA1C in the last 72 hours. CBG: Recent Labs  Lab 02/05/18 0456  GLUCAP 159*   Lipid Profile: No results for input(s): CHOL, HDL, LDLCALC, TRIG, CHOLHDL, LDLDIRECT in the last 72 hours. Thyroid Function Tests: No results for input(s): TSH, T4TOTAL, FREET4, T3FREE, THYROIDAB in the last 72 hours. Anemia Panel: No results for input(s): VITAMINB12, FOLATE, FERRITIN, TIBC,  IRON, RETICCTPCT in the last 72 hours. Urine analysis:    Component Value Date/Time  COLORURINE YELLOW 02/07/2018 0705   APPEARANCEUR CLEAR 02/07/2018 0705   LABSPEC 1.008 02/07/2018 0705   PHURINE 6.0 02/07/2018 0705   GLUCOSEU 50 (A) 02/07/2018 0705   HGBUR NEGATIVE 02/07/2018 0705   BILIRUBINUR NEGATIVE 02/07/2018 0705   KETONESUR NEGATIVE 02/07/2018 0705   PROTEINUR NEGATIVE 02/07/2018 0705   UROBILINOGEN 1.0 04/22/2012 1416   NITRITE NEGATIVE 02/07/2018 0705   LEUKOCYTESUR NEGATIVE 02/07/2018 0705    Radiological Exams on Admission: Ct Abdomen Pelvis W Contrast  Result Date: 02/07/2018 CLINICAL DATA:  62 year old female with 4 days of chest pain, right upper quadrant and epigastric pain. Abdominal distension. EXAM: CT ABDOMEN AND PELVIS WITH CONTRAST TECHNIQUE: Multidetector CT imaging of the abdomen and pelvis was performed using the standard protocol following bolus administration of intravenous contrast. CONTRAST:  ISOVUE-300 IOPAMIDOL (ISOVUE-300) INJECTION 61% COMPARISON:  Lumbar MRI 01/24/2017. FINDINGS: Lower chest: Mild cardiomegaly. No pericardial effusion. No pleural effusion. Mild lung base atelectasis. Hepatobiliary: Negative liver and gallbladder. Pancreas: On series 2, image 35 at the level of the pancreatic head there is an indistinct appearance of the duodenum and suggestion of mild regional fat stranding. See also coronal image 68 where the 1st and 2nd portion of the duodenum appear indistinct. Pancreatic uncinate appears mildly lobulated on coronal image 60, and the uncinate seems smaller on prior lumbar MRIs in 2018, and also 2009. There is no peripancreatic lymphadenopathy, although there is mild dependent retroperitoneal stranding just caudal to the duodenum and a maximal retroperitoneal lymph node on series 2, image 53. The body and tail of the pancreas have a more normal appearance. Spleen: Negative. Adrenals/Urinary Tract: Normal adrenal glands. Bilateral  renal enhancement and contrast excretion is symmetric and within normal limits. No perinephric stranding. No hydroureter.  Unremarkable urinary bladder. Stomach/Bowel: Negative rectum and distal sigmoid colon. The proximal sigmoid is redundant with moderate diverticulosis which continues into the descending colon. No active inflammation is identified. Redundant transverse colon. Redundant hepatic flexure with retained stool. Retained stool in the right colon. Normal appendix on series 2, image 66. Negative terminal ileum. No dilated small bowel. There is lower rectus muscle diastasis (series 2, image 67) with subjacent small bowel and mesentery, but no overt herniation of bowel. Negative stomach. Duodenum described above. No abdominal free air, free fluid. Vascular/Lymphatic: Aortoiliac calcified atherosclerosis. Major arterial structures remain patent. Portal venous system is patent. Reproductive: Negative. Other: No pelvic free fluid. Musculoskeletal: Lower lumbar facet degeneration. Bilateral L4-L5 and L5-S1 vacuum facet. No acute osseous abnormality identified. IMPRESSION: 1. Inflamed appearing proximal duodenum, and subtle inflammation about the pancreatic head on series 2, image 35. Mild Acute Pancreatitis suspected. 2. 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 RECOMMEND follow-up outpatient Abdomen MRI (pancreatic protocol without and with contrast) in 4-6 weeks to exclude the possibility of an uncinate tumor. 3. Diverticulosis of the left colon without active inflammation. Normal appendix. Aortic Atherosclerosis (ICD10-I70.0). Electronically Signed   By: Odessa Fleming M.D.   On: 02/07/2018 08:15    Assessment/Plan Active Problems:   Pancreatitis #1 acute pancreatitis patient will be kept n.p.o. IV fluids pain control advance diet as tolerated.SHE NEEDS A MRI PANCREATIC PROTOCOL 4 WEEKS FROM  NOW TO R/O UNCINATE TUMOR.  Check lipid panel and right upper quadrant  ultrasound 02/05/2018 no acute findings.  #2 type 2 diabetes I will place her on SSI patient takes Lantus and metformin at home along with saxagliptin and semaglutide.  #3 history of hypertension continue Norvasc, lisinopril, Catapres and Coreg #4  hyperlipidemia continue Lipitor.  #5 history of CAD continue Plavix.  #6 history of neuropathy continue Neurontin.  #7 chronic chronic constipation on Movantik.  #8 chronic pain patient takes Percocet at home.  DVT prophylaxis: Lovenox Code Status: Full code Family Communication: No family available Disposition Plan: Pending clinical improvement Consults called: None Admission status inpatient :Alwyn Ren MD [facility]   If 7PM-7AM, please contact night-coverage [email or web address] Password [email or web address]  02/07/2018, 9:28 AM

## 2018-02-08 DIAGNOSIS — G629 Polyneuropathy, unspecified: Secondary | ICD-10-CM

## 2018-02-08 LAB — COMPREHENSIVE METABOLIC PANEL
ALBUMIN: 3.1 g/dL — AB (ref 3.5–5.0)
ALK PHOS: 110 U/L (ref 38–126)
ALT: 10 U/L (ref 0–44)
ANION GAP: 6 (ref 5–15)
AST: 10 U/L — AB (ref 15–41)
BUN: 9 mg/dL (ref 8–23)
CALCIUM: 8.7 mg/dL — AB (ref 8.9–10.3)
CO2: 28 mmol/L (ref 22–32)
Chloride: 108 mmol/L (ref 98–111)
Creatinine, Ser: 0.72 mg/dL (ref 0.44–1.00)
GFR calc Af Amer: >60 mL/min (ref >60)
GFR calc non Af Amer: >60 mL/min (ref >60)
GLUCOSE: 144 mg/dL — AB (ref 70–99)
POTASSIUM: 3.6 mmol/L (ref 3.5–5.1)
SODIUM: 142 mmol/L (ref 135–145)
Total Bilirubin: 0.4 mg/dL (ref 0.3–1.2)
Total Protein: 5.9 g/dL — ABNORMAL LOW (ref 6.5–8.1)

## 2018-02-08 LAB — CBC
HCT: 39.1 % (ref 36.0–46.0)
HEMOGLOBIN: 11.5 g/dL — AB (ref 12.0–15.0)
MCH: 27.1 pg (ref 26.0–34.0)
MCHC: 29.4 g/dL — ABNORMAL LOW (ref 30.0–36.0)
MCV: 92.2 fL (ref 80.0–100.0)
Platelets: 286 10*3/uL (ref 150–400)
RBC: 4.24 MIL/uL (ref 3.87–5.11)
RDW: 13.9 % (ref 11.5–15.5)
WBC: 5.6 10*3/uL (ref 4.0–10.5)
nRBC: 0 % (ref 0.0–0.2)

## 2018-02-08 LAB — GLUCOSE, CAPILLARY
GLUCOSE-CAPILLARY: 119 mg/dL — AB (ref 70–99)
GLUCOSE-CAPILLARY: 140 mg/dL — AB (ref 70–99)
GLUCOSE-CAPILLARY: 69 mg/dL — AB (ref 70–99)
Glucose-Capillary: 123 mg/dL — ABNORMAL HIGH (ref 70–99)
Glucose-Capillary: 74 mg/dL (ref 70–99)

## 2018-02-08 LAB — LIPID PANEL
CHOL/HDL RATIO: 5 ratio
Cholesterol: 135 mg/dL (ref 0–200)
HDL: 27 mg/dL — AB (ref >40)
LDL Cholesterol: 90 mg/dL (ref 0–99)
Triglycerides: 88 mg/dL (ref <150)
VLDL: 18 mg/dL (ref 0–40)

## 2018-02-08 LAB — LIPASE, BLOOD: Lipase: 35 U/L (ref 11–51)

## 2018-02-08 MED ORDER — MORPHINE SULFATE (PF) 4 MG/ML IV SOLN
4.0000 mg | INTRAVENOUS | Status: DC | PRN
  Administered 2018-02-08 – 2018-02-09 (×6): 4 mg via INTRAVENOUS
  Filled 2018-02-08 (×6): qty 1

## 2018-02-08 MED ORDER — TRAZODONE HCL 50 MG PO TABS
50.0000 mg | ORAL_TABLET | Freq: Every evening | ORAL | Status: DC | PRN
  Administered 2018-02-08 – 2018-02-10 (×3): 50 mg via ORAL
  Filled 2018-02-08 (×3): qty 1

## 2018-02-08 MED ORDER — SEMAGLUTIDE(0.25 OR 0.5MG/DOS) 2 MG/1.5ML ~~LOC~~ SOPN: 0.5000 [IU] | PEN_INJECTOR | SUBCUTANEOUS | Status: DC

## 2018-02-08 MED ORDER — BUPROPION HCL ER (SR) 150 MG PO TB12
150.0000 mg | ORAL_TABLET | Freq: Every day | ORAL | Status: DC
  Administered 2018-02-08 – 2018-02-11 (×4): 150 mg via ORAL
  Filled 2018-02-08 (×4): qty 1

## 2018-02-08 MED ORDER — ASPIRIN 81 MG PO CHEW
81.0000 mg | CHEWABLE_TABLET | Freq: Every day | ORAL | Status: DC
  Administered 2018-02-08: 81 mg via ORAL
  Filled 2018-02-08 (×4): qty 1

## 2018-02-08 MED ORDER — VITAMIN D (ERGOCALCIFEROL) 1.25 MG (50000 UNIT) PO CAPS
50000.0000 [IU] | ORAL_CAPSULE | ORAL | Status: DC
  Administered 2018-02-08: 50000 [IU] via ORAL
  Filled 2018-02-08: qty 1

## 2018-02-08 MED ORDER — OXYCODONE-ACETAMINOPHEN 7.5-325 MG PO TABS: 1.0000 | ORAL_TABLET | ORAL | Status: DC | PRN

## 2018-02-08 MED ORDER — INSULIN GLARGINE 100 UNIT/ML ~~LOC~~ SOLN
20.0000 [IU] | Freq: Two times a day (BID) | SUBCUTANEOUS | Status: DC
  Administered 2018-02-08 (×2): 20 [IU] via SUBCUTANEOUS
  Filled 2018-02-08 (×3): qty 0.2

## 2018-02-08 MED ORDER — LINAGLIPTIN 5 MG PO TABS: 5.0000 mg | ORAL_TABLET | Freq: Every day | ORAL | Status: DC

## 2018-02-08 MED ORDER — DEXTROSE 50 % IV SOLN
25.0000 mL | Freq: Once | INTRAVENOUS | Status: AC
  Administered 2018-02-08: 25 mL via INTRAVENOUS
  Filled 2018-02-08: qty 50

## 2018-02-08 MED ORDER — SODIUM CHLORIDE 0.9 % IV SOLN
INTRAVENOUS | Status: AC
  Administered 2018-02-08 – 2018-02-09 (×3): via INTRAVENOUS

## 2018-02-08 MED ORDER — PANTOPRAZOLE SODIUM 40 MG PO PACK
40.0000 mg | PACK | Freq: Every day | ORAL | Status: DC
  Administered 2018-02-08: 40 mg
  Filled 2018-02-08 (×2): qty 20

## 2018-02-08 MED ORDER — VITAMIN B-12 1000 MCG PO TABS
1000.0000 ug | ORAL_TABLET | Freq: Every day | ORAL | Status: DC
  Administered 2018-02-08 – 2018-02-11 (×4): 1000 ug via ORAL
  Filled 2018-02-08 (×4): qty 1

## 2018-02-08 MED ORDER — ONDANSETRON HCL 4 MG/2ML IJ SOLN
4.0000 mg | Freq: Four times a day (QID) | INTRAMUSCULAR | Status: DC | PRN
  Administered 2018-02-08: 4 mg via INTRAVENOUS
  Filled 2018-02-08: qty 2

## 2018-02-08 NOTE — Progress Notes (Signed)
[facility] PROGRESS NOTE    Progress Note  Miranda [name]  ZHY:[id number] DOB: [birth year] DOA: 02/07/2018 PCP: [name], MD     Brief Narrative:   Miranda [name] is an 62 y.o. female past medical history of diabetes mellitus type 2, essential hypertension stroke, who was recently seen in the ED on 02/05/2018 for abdominal pain work-up during that time was negative, chronic diastolic heart failure  Assessment/Plan:   Acute pancreatitis: She is on a symptomatic which is in the GP 1 family class that is notorious for causing pancreatitis. We will discontinue this I have already informed the patient. Triglycerides of 18. CT scan of the abdomen and pelvis on 02/08/2018 showed an inflamed appearing proximal duodenum, with a subtle inflammation of the pancreatic head. Also recommend an MRI of the abdomen as an outpatient.  As there is an enlarged pancreatic insonate. Keep the patient n.p.o. change IV fluids to normal saline monitor strict I's and O's continue narcotics IV for pain.  Essential hypertension Continue Norvasc Coreg and clonidine.  Insulin dependent diabetes mellitus [facility]): Currently she is on Lantus 20 units twice a day plus sliding scale insulin. CBGs every 4 hours.  Peripheral neuropathy Continue Neurontin.   DVT prophylaxis: lovenxo Family Communication:none Disposition Plan/Barrier to D/C: home in 2-3 days Code Status:     Code Status Orders  (From admission, onward)         Start     Ordered   02/07/18 0927  Full code  Continuous     02/07/18 0927        Code Status History    Date Active Date Inactive Code Status Order ID Comments User Context   12/30/2016 0550 01/01/2017 0137 Full Code [id number]  Eduard Clos, MD ED   11/21/2015 0222 11/22/2015 1556 Full Code [id number]  Eduard Clos, MD Inpatient   05/31/2014 2039 06/01/2014 0109 Full Code [id number]  Glynn Octave, MD ED        IV Access:    Peripheral  IV   Procedures and diagnostic studies:   Ct Abdomen Pelvis W Contrast  Result Date: 02/07/2018 CLINICAL DATA:  62 year old female with 4 days of chest pain, right upper quadrant and epigastric pain. Abdominal distension. EXAM: CT ABDOMEN AND PELVIS WITH CONTRAST TECHNIQUE: Multidetector CT imaging of the abdomen and pelvis was performed using the standard protocol following bolus administration of intravenous contrast. CONTRAST:  ISOVUE-300 IOPAMIDOL (ISOVUE-300) INJECTION 61% COMPARISON:  Lumbar MRI 01/24/2017. FINDINGS: Lower chest: Mild cardiomegaly. No pericardial effusion. No pleural effusion. Mild lung base atelectasis. Hepatobiliary: Negative liver and gallbladder. Pancreas: On series 2, image 35 at the level of the pancreatic head there is an indistinct appearance of the duodenum and suggestion of mild regional fat stranding. See also coronal image 68 where the 1st and 2nd portion of the duodenum appear indistinct. Pancreatic uncinate appears mildly lobulated on coronal image 60, and the uncinate seems smaller on prior lumbar MRIs in 2018, and also 2009. There is no peripancreatic lymphadenopathy, although there is mild dependent retroperitoneal stranding just caudal to the duodenum and a maximal retroperitoneal lymph node on series 2, image 53. The body and tail of the pancreas have a more normal appearance. Spleen: Negative. Adrenals/Urinary Tract: Normal adrenal glands. Bilateral renal enhancement and contrast excretion is symmetric and within normal limits. No perinephric stranding. No hydroureter.  Unremarkable urinary bladder. Stomach/Bowel: Negative rectum and distal sigmoid colon. The proximal sigmoid is redundant with moderate diverticulosis which continues into the descending  colon. No active inflammation is identified. Redundant transverse colon. Redundant hepatic flexure with retained stool. Retained stool in the right colon. Normal appendix on series 2, image 66. Negative terminal  ileum. No dilated small bowel. There is lower rectus muscle diastasis (series 2, image 67) with subjacent small bowel and mesentery, but no overt herniation of bowel. Negative stomach. Duodenum described above. No abdominal free air, free fluid. Vascular/Lymphatic: Aortoiliac calcified atherosclerosis. Major arterial structures remain patent. Portal venous system is patent. Reproductive: Negative. Other: No pelvic free fluid. Musculoskeletal: Lower lumbar facet degeneration. Bilateral L4-L5 and L5-S1 vacuum facet. No acute osseous abnormality identified. IMPRESSION: 1. Inflamed appearing proximal duodenum, and subtle inflammation about the pancreatic head on series 2, image 35. Mild Acute Pancreatitis suspected. 2. 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 RECOMMEND follow-up outpatient Abdomen MRI (pancreatic protocol without and with contrast) in 4-6 weeks to exclude the possibility of an uncinate tumor. 3. Diverticulosis of the left colon without active inflammation. Normal appendix. Aortic Atherosclerosis (ICD10-I70.0). Electronically Signed   By: Odessa Fleming M.D.   On: 02/07/2018 08:15     Medical Consultants:    None.  Anti-Infectives:   None  Subjective:    Miranda [name] she relates she is still anorexic complaining of pain radiating to her back, she is also nauseated but has not vomited  Objective:    Vitals:   02/07/18 1207 02/07/18 1403 02/07/18 2001 02/08/18 0436  BP: (!) 137/95 (!) 152/83 113/70 (!) 172/86  Pulse: 82 69 63 65  Resp: 16 16 20 20   Temp: 97.6 F (36.4 C) 97.8 F (36.6 C) 97.7 F (36.5 C) 97.6 F (36.4 C)  TempSrc: Oral Oral Oral Oral  SpO2: 100% 97% 96% 97%  Weight:      Height:        Intake/Output Summary (Last 24 hours) at 02/08/2018 0845 Last data filed at 02/08/2018 0600 Gross per 24 hour  Intake 1868.58 ml  Output -  Net 1868.58 ml   Filed Weights   02/07/18 0329  Weight: 104.3 kg     Exam: General exam: In no acute distress. Respiratory system: Good air movement and clear to auscultation. Cardiovascular system: S1 & S2 heard, RRR.  Gastrointestinal system: Positive bowel sounds soft epigastric and right upper quadrant tenderness no rebound or guarding. Central nervous system: Alert and oriented. No focal neurological deficits. Extremities: No pedal edema. Skin: No rashes, lesions or ulcers Psychiatry: Judgement and insight appear normal. Mood & affect appropriate.    Data Reviewed:    Labs: Basic Metabolic Panel: Recent Labs  Lab 02/04/18 2006 02/07/18 0509 02/08/18 0639  NA 143 140 142  K 3.5 3.4* 3.6  CL 103 103 108  CO2 29 29 28   GLUCOSE 254* 185* 144*  BUN 10 13 9   CREATININE 0.76 0.85 0.72  CALCIUM 9.4 9.2 8.7*   GFR Estimated Creatinine Clearance: 96.5 mL/min (by C-G formula based on SCr of 0.72 mg/dL). Liver Function Tests: Recent Labs  Lab 02/05/18 0514 02/07/18 0509 02/08/18 0639  AST 12* 11* 10*  ALT 12 11 10   ALKPHOS 120 134* 110  BILITOT 0.3 0.4 0.4  PROT 7.0 7.6 5.9*  ALBUMIN 3.5 3.8 3.1*   Recent Labs  Lab 02/05/18 0514 02/07/18 0509 02/08/18 0639  LIPASE 128* 71* 35   No results for input(s): AMMONIA in the last 168 hours. Coagulation profile No results for input(s): INR, PROTIME in the last 168 hours.  CBC: Recent Labs  Lab 02/04/18 2006 02/07/18 0509 02/08/18 0639  WBC 8.6 8.6 5.6  HGB 13.4 13.5 11.5*  HCT 44.2 45.0 39.1  MCV 90.4 90.9 92.2  PLT 332 315 286   Cardiac Enzymes: No results for input(s): CKTOTAL, CKMB, CKMBINDEX, TROPONINI in the last 168 hours. BNP (last 3 results) No results for input(s): PROBNP in the last 8760 hours. CBG: Recent Labs  Lab 02/05/18 0456 02/07/18 1749 02/07/18 1944 02/08/18 0744  GLUCAP 159* 124* 121* 119*   D-Dimer: No results for input(s): DDIMER in the last 72 hours. Hgb A1c: No results for input(s): HGBA1C in the last 72 hours. Lipid Profile: Recent  Labs    02/08/18 0639  CHOL 135  HDL 27*  LDLCALC 90  TRIG 88  CHOLHDL 5.0   Thyroid function studies: No results for input(s): TSH, T4TOTAL, T3FREE, THYROIDAB in the last 72 hours.  Invalid input(s): FREET3 Anemia work up: No results for input(s): VITAMINB12, FOLATE, FERRITIN, TIBC, IRON, RETICCTPCT in the last 72 hours. Sepsis Labs: Recent Labs  Lab 02/04/18 2006 02/07/18 0509 02/08/18 0639  WBC 8.6 8.6 5.6   Microbiology No results found for this or any previous visit (from the past 240 hour(s)).   Medications:   . amLODipine  10 mg Oral Daily  . atorvastatin  80 mg Oral q1800  . carvedilol  12.5 mg Oral BID WC  . cloNIDine  0.2 mg Oral Daily  . clopidogrel  75 mg Oral Daily  . enoxaparin (LOVENOX) injection  40 mg Subcutaneous Daily  . gabapentin  300 mg Oral TID  . insulin aspart  0-15 Units Subcutaneous TID WC  . lisinopril  40 mg Oral QHS  . naloxegol oxalate  25 mg Oral QAC breakfast  . nicotine  7 mg Transdermal Daily   Continuous Infusions: . sodium chloride 125 mL/hr at 02/08/18 0300     LOS: 1 day   Marinda Elk  [facility] Pager [phone]  *Please refer to [email or web address], password [email or web address] to get updated schedule on who will round on this patient, as hospitalists switch teams weekly. If 7PM-7AM, please contact night-coverage at [email or web address], password [email or web address] for any overnight needs.  02/08/2018, 8:45 AM

## 2018-02-09 LAB — GLUCOSE, CAPILLARY
GLUCOSE-CAPILLARY: 154 mg/dL — AB (ref 70–99)
GLUCOSE-CAPILLARY: 88 mg/dL (ref 70–99)
GLUCOSE-CAPILLARY: 69 mg/dL — AB (ref 70–99)
Glucose-Capillary: 52 mg/dL — ABNORMAL LOW (ref 70–99)
Glucose-Capillary: 60 mg/dL — ABNORMAL LOW (ref 70–99)
Glucose-Capillary: 100 mg/dL — ABNORMAL HIGH (ref 70–99)
Glucose-Capillary: 143 mg/dL — ABNORMAL HIGH (ref 70–99)

## 2018-02-09 LAB — HIV ANTIBODY (ROUTINE TESTING W REFLEX): HIV Screen 4th Generation wRfx: NONREACTIVE

## 2018-02-09 MED ORDER — LIP MEDEX EX OINT
1.0000 "application " | TOPICAL_OINTMENT | CUTANEOUS | Status: DC | PRN
  Administered 2018-02-09: 1 via TOPICAL
  Filled 2018-02-09: qty 7

## 2018-02-09 MED ORDER — PANTOPRAZOLE SODIUM 40 MG PO PACK: 40.0000 mg | PACK | Freq: Every day | ORAL | Status: DC

## 2018-02-09 MED ORDER — DEXTROSE 5 % IV SOLN
INTRAVENOUS | Status: AC
  Administered 2018-02-09: 12:00:00 via INTRAVENOUS

## 2018-02-09 MED ORDER — INSULIN GLARGINE 100 UNIT/ML ~~LOC~~ SOLN
10.0000 [IU] | Freq: Two times a day (BID) | SUBCUTANEOUS | Status: DC
  Filled 2018-02-09 (×2): qty 0.1

## 2018-02-09 MED ORDER — DEXTROSE 50 % IV SOLN: 25.0000 mL | Freq: Once | INTRAVENOUS | Status: DC

## 2018-02-09 MED ORDER — INSULIN GLARGINE 100 UNIT/ML ~~LOC~~ SOLN: 15.0000 [IU] | Freq: Two times a day (BID) | SUBCUTANEOUS | Status: DC

## 2018-02-09 MED ORDER — PANTOPRAZOLE SODIUM 40 MG PO PACK
40.0000 mg | PACK | Freq: Every day | ORAL | Status: DC
  Administered 2018-02-09 – 2018-02-11 (×3): 40 mg via ORAL
  Filled 2018-02-09 (×4): qty 20

## 2018-02-09 MED ORDER — MORPHINE SULFATE (PF) 4 MG/ML IV SOLN
4.0000 mg | INTRAVENOUS | Status: DC | PRN
  Administered 2018-02-09: 6 mg via INTRAVENOUS
  Filled 2018-02-09: qty 2

## 2018-02-09 MED ORDER — DEXTROSE 50 % IV SOLN
INTRAVENOUS | Status: AC
  Filled 2018-02-09: qty 50

## 2018-02-09 MED ORDER — HYDROCODONE-ACETAMINOPHEN 5-325 MG PO TABS
2.0000 | ORAL_TABLET | Freq: Once | ORAL | Status: AC
  Administered 2018-02-09: 2 via ORAL
  Filled 2018-02-09: qty 2

## 2018-02-09 MED ORDER — DEXTROSE 50 % IV SOLN
1.0000 | Freq: Once | INTRAVENOUS | Status: AC
  Administered 2018-02-09: 50 mL via INTRAVENOUS

## 2018-02-09 MED ORDER — DEXTROSE 50 % IV SOLN
25.0000 mL | Freq: Once | INTRAVENOUS | Status: AC
  Administered 2018-02-09: 25 mL via INTRAVENOUS
  Filled 2018-02-09: qty 50

## 2018-02-09 MED ORDER — HYDROCODONE-ACETAMINOPHEN 5-325 MG PO TABS
1.0000 | ORAL_TABLET | Freq: Four times a day (QID) | ORAL | Status: DC | PRN
  Administered 2018-02-10: 2 via ORAL
  Filled 2018-02-09: qty 2

## 2018-02-09 NOTE — Progress Notes (Signed)
[facility] PROGRESS NOTE    Progress Note  Miranda [name]  ZOX:[id number] DOB: 12/26/1955 DOA: 02/07/2018 PCP: [name], MD     Brief Narrative:   Miranda [name] is an 62 y.o. female past medical history of diabetes mellitus type 2, essential hypertension stroke, who was recently seen in the ED on 02/05/2018 for abdominal pain work-up during that time was negative, chronic diastolic heart failure  Assessment/Plan:   Acute idiopathic pancreatitis: Question due to Semaglutide (GP 1 family class that is notorious for causing pancreatitis). She relates that she does not like the way Dilaudid makes her feel I will discontinue Dilaudid. Increase her IV morphine. Keep her n.p.o., continue IV fluids. CT scan of the abdomen and pelvis on 02/08/2018 showed an inflamed appearing proximal duodenum, with a subtle inflammation of the pancreatic head. Check an MRI of the abdomen as an outpatient.  As there is an enlarged pancreatic insonate.  Essential hypertension Continue Norvasc Coreg and clonidine.  Insulin dependent diabetes mellitus [facility]): Currently she is on Lantus 20 units twice a day plus sliding scale insulin. CBGs every 4 hours.  Peripheral neuropathy Continue Neurontin.   DVT prophylaxis: lovenxo Family Communication:none Disposition Plan/Barrier to D/C: home in 2-3 days Code Status:     Code Status Orders  (From admission, onward)         Start     Ordered   02/07/18 0927  Full code  Continuous     02/07/18 0927        Code Status History    Date Active Date Inactive Code Status Order ID Comments User Context   12/30/2016 0550 01/01/2017 0137 Full Code [id number]  Eduard Clos, MD ED   11/21/2015 0222 11/22/2015 1556 Full Code [id number]  Eduard Clos, MD Inpatient   05/31/2014 2039 06/01/2014 0109 Full Code [id number]  Glynn Octave, MD ED        IV Access:    Peripheral IV   Procedures and diagnostic studies:   No results  found.   Medical Consultants:    None.  Anti-Infectives:   None  Subjective:    Miranda [name] anorexic, continues to have epigastric pain.  Objective:    Vitals:   02/08/18 0436 02/08/18 1439 02/08/18 2012 02/09/18 0523  BP: (!) 172/86 117/80 127/80 (!) 141/91  Pulse: 65 64 64 67  Resp: 20 18 20 20   Temp: 97.6 F (36.4 C) 98.3 F (36.8 C) 98 F (36.7 C) 98.5 F (36.9 C)  TempSrc: Oral Oral Oral Oral  SpO2: 97% 95% 94% 100%  Weight:      Height:        Intake/Output Summary (Last 24 hours) at 02/09/2018 0956 Last data filed at 02/08/2018 2100 Gross per 24 hour  Intake 912.95 ml  Output -  Net 912.95 ml   Filed Weights   02/07/18 0329  Weight: 104.3 kg    Exam: General exam: In no acute distress. Respiratory system: Good air movement and clear to auscultation. Cardiovascular system: S1 & S2 heard, RRR.  Gastrointestinal system: Positive bowel sounds soft epigastric and right upper quadrant tenderness no rebound or guarding. Central nervous system: Alert and oriented. No focal neurological deficits. Extremities: No pedal edema. Skin: No rashes, lesions or ulcers Psychiatry: Judgement and insight appear normal. Mood & affect appropriate.    Data Reviewed:    Labs: Basic Metabolic Panel: Recent Labs  Lab 02/04/18 2006 02/07/18 0509 02/08/18 0639  NA 143 140 142  K 3.5  3.4* 3.6  CL 103 103 108  CO2 29 29 28   GLUCOSE 254* 185* 144*  BUN 10 13 9   CREATININE 0.76 0.85 0.72  CALCIUM 9.4 9.2 8.7*   GFR Estimated Creatinine Clearance: 96.5 mL/min (by C-G formula based on SCr of 0.72 mg/dL). Liver Function Tests: Recent Labs  Lab 02/05/18 0514 02/07/18 0509 02/08/18 0639  AST 12* 11* 10*  ALT 12 11 10   ALKPHOS 120 134* 110  BILITOT 0.3 0.4 0.4  PROT 7.0 7.6 5.9*  ALBUMIN 3.5 3.8 3.1*   Recent Labs  Lab 02/05/18 0514 02/07/18 0509 02/08/18 0639  LIPASE 128* 71* 35   No results for input(s): AMMONIA in the last 168  hours. Coagulation profile No results for input(s): INR, PROTIME in the last 168 hours.  CBC: Recent Labs  Lab 02/04/18 2006 02/07/18 0509 02/08/18 0639  WBC 8.6 8.6 5.6  HGB 13.4 13.5 11.5*  HCT 44.2 45.0 39.1  MCV 90.4 90.9 92.2  PLT 332 315 286   Cardiac Enzymes: No results for input(s): CKTOTAL, CKMB, CKMBINDEX, TROPONINI in the last 168 hours. BNP (last 3 results) No results for input(s): PROBNP in the last 8760 hours. CBG: Recent Labs  Lab 02/08/18 1657 02/08/18 1750 02/08/18 2042 02/09/18 0734 02/09/18 0801  GLUCAP 69* 123* 74 52* 154*   D-Dimer: No results for input(s): DDIMER in the last 72 hours. Hgb A1c: No results for input(s): HGBA1C in the last 72 hours. Lipid Profile: Recent Labs    02/08/18 0639  CHOL 135  HDL 27*  LDLCALC 90  TRIG 88  CHOLHDL 5.0   Thyroid function studies: No results for input(s): TSH, T4TOTAL, T3FREE, THYROIDAB in the last 72 hours.  Invalid input(s): FREET3 Anemia work up: No results for input(s): VITAMINB12, FOLATE, FERRITIN, TIBC, IRON, RETICCTPCT in the last 72 hours. Sepsis Labs: Recent Labs  Lab 02/04/18 2006 02/07/18 0509 02/08/18 0639  WBC 8.6 8.6 5.6   Microbiology No results found for this or any previous visit (from the past 240 hour(s)).   Medications:   . amLODipine  10 mg Oral Daily  . aspirin  81 mg Oral Daily  . atorvastatin  80 mg Oral q1800  . buPROPion  150 mg Oral Daily  . carvedilol  12.5 mg Oral BID WC  . cloNIDine  0.2 mg Oral Daily  . clopidogrel  75 mg Oral Daily  . enoxaparin (LOVENOX) injection  40 mg Subcutaneous Daily  . gabapentin  300 mg Oral TID  . insulin aspart  0-15 Units Subcutaneous TID WC  . insulin glargine  10 Units Subcutaneous BID  . lisinopril  40 mg Oral QHS  . nicotine  7 mg Transdermal Daily  . pantoprazole sodium  40 mg Per Tube Daily  . vitamin B-12  1,000 mcg Oral Daily  . Vitamin D (Ergocalciferol)  50,000 Units Oral Q7 days   Continuous  Infusions:    LOS: 2 days   Marinda Elk  [facility] Pager [phone]  *Please refer to [email or web address], password [email or web address] to get updated schedule on who will round on this patient, as hospitalists switch teams weekly. If 7PM-7AM, please contact night-coverage at [email or web address], password [email or web address] for any overnight needs.  02/09/2018, 9:56 AM

## 2018-02-09 NOTE — Progress Notes (Signed)
Hypoglycemic Event  CBG: 52  Treatment: 1 amp d50  Symptoms: Asymptomatic  Follow-up CBG: Time: 0800 CBG Result:154  Possible Reasons for Event:NPO/Lantus  Comments/MD notified: Gilman Buttner

## 2018-02-10 DIAGNOSIS — I10 Essential (primary) hypertension: Secondary | ICD-10-CM

## 2018-02-10 DIAGNOSIS — M545 Low back pain, unspecified: Secondary | ICD-10-CM

## 2018-02-10 DIAGNOSIS — Z794 Long term (current) use of insulin: Secondary | ICD-10-CM

## 2018-02-10 DIAGNOSIS — G6181 Chronic inflammatory demyelinating polyneuritis: Secondary | ICD-10-CM

## 2018-02-10 DIAGNOSIS — E119 Type 2 diabetes mellitus without complications: Secondary | ICD-10-CM

## 2018-02-10 DIAGNOSIS — K8500 Idiopathic acute pancreatitis without necrosis or infection: Principal | ICD-10-CM

## 2018-02-10 DIAGNOSIS — Z720 Tobacco use: Secondary | ICD-10-CM

## 2018-02-10 LAB — GLUCOSE, CAPILLARY
GLUCOSE-CAPILLARY: 47 mg/dL — AB (ref 70–99)
Glucose-Capillary: 103 mg/dL — ABNORMAL HIGH (ref 70–99)
Glucose-Capillary: 257 mg/dL — ABNORMAL HIGH (ref 70–99)
Glucose-Capillary: 84 mg/dL (ref 70–99)
Glucose-Capillary: 144 mg/dL — ABNORMAL HIGH (ref 70–99)

## 2018-02-10 MED ORDER — OXYCODONE-ACETAMINOPHEN 7.5-325 MG PO TABS
1.0000 | ORAL_TABLET | ORAL | Status: DC | PRN
  Administered 2018-02-10 – 2018-02-11 (×6): 1 via ORAL
  Filled 2018-02-10 (×6): qty 1

## 2018-02-10 MED ORDER — DICLOFENAC SODIUM 1 % TD GEL
4.0000 g | Freq: Four times a day (QID) | TRANSDERMAL | Status: DC
  Administered 2018-02-10 – 2018-02-11 (×4): 4 g via TOPICAL
  Filled 2018-02-10: qty 100

## 2018-02-10 MED ORDER — SODIUM CHLORIDE 0.9 % IV BOLUS: 1000.0000 mL | Freq: Once | INTRAVENOUS | Status: DC

## 2018-02-10 MED ORDER — POLYETHYLENE GLYCOL 3350 17 G PO PACK
17.0000 g | PACK | Freq: Every day | ORAL | Status: DC
  Administered 2018-02-10 – 2018-02-11 (×2): 17 g via ORAL
  Filled 2018-02-10 (×2): qty 1

## 2018-02-10 MED ORDER — NALOXEGOL OXALATE 25 MG PO TABS
25.0000 mg | ORAL_TABLET | Freq: Every day | ORAL | Status: DC
  Administered 2018-02-10 – 2018-02-11 (×2): 25 mg via ORAL
  Filled 2018-02-10 (×2): qty 1

## 2018-02-10 NOTE — Progress Notes (Addendum)
Hypoglycemic Event  CBG: 47  Treatment: 15 GM carbohydrate snack  Symptoms: None  Follow-up CBG: Time:1741 CBG Result: 84  Possible Reasons for Event: Inadequate meal intake and Unknown  Comments/MD notified: Rizwan face-face    Justin Mend

## 2018-02-10 NOTE — Progress Notes (Signed)
PROGRESS NOTE    ARES TEGTMEYER   ZOX:[id number]  DOB: 02-10-1956  DOA: 02/07/2018 PCP: [name], MD   Brief Narrative:  Miranda [name] is an 62 y.o. female past medical history of diabetes mellitus type 2, essential hypertension, chronic diastolic heart failure, stroke, who was recently seen in the ED on 02/05/2018 for abdominal pain and had negative troponin & LFTs. Lipase was 128 and she had a RUQ ultrasound which was unrevealing. She was given IVF and was advised to take a liquid diet and discharged home. She returned within 24 hrs for ongoing abdominal pain.  She also notes a 10 lb wt loss in 1 month. CT abd/pelvis:  Inflamed appearing proximal duodenum, and subtle inflammation about the pancreatic head on series 2, image 35. Mild Acute Pancreatitis suspected. 2. 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 RECOMMEND follow-up outpatient Abdomen MRI (pancreatic protocol without and with contrast) in 4-6 weeks to exclude the possibility of an uncinate tumor.  Subjective: Has some right sided lower back pain which is constant and moderate in intensity. No nausea or abdominal pain.    Assessment & Plan:   Principal Problem:   Acute pancreatitis - ? Due to semaglutide- will be d/cing this - advance diet today  - will need MRI as outpt of the pancrease ? Uncinate enlargement   Active Problems:   Essential hypertension - cont Coreg, Clonidine and Amlodipine - Lisinopril on hold    Insulin dependent diabetes mellitus  - hypoglycemic at this time - she takes Lantus 20 U BID at home, Metformin, Saxagliptan and Semaglutide all of which are on hold - currently on moderate dose SSI only- Lantus was stopped on 10/28 -  follow- diet being advanced  Right lower back pain - muscular- start Voltaren gel and k pad for this    Tobacco abuse - Nicotine patch  H/o CVA - Plavix, Lipitor  Diabetic neuropathy - Gabapentin,  Oxycodone  Constipation - [place]    Peripheral neuropathy   DVT prophylaxis: Lovenox Code Status: Full code Family Communication:  Disposition Plan: home when tolerating solids Consultants:   none Procedures:   none Antimicrobials:  Anti-infectives (From admission, onward)   None       Objective: Vitals:   02/09/18 2009 02/10/18 0453 02/10/18 0753 02/10/18 1310  BP: 127/72 131/73  104/62  Pulse: 60 (!) 58 60 66  Resp: 16 15  16   Temp: 98.7 F (37.1 C) 97.8 F (36.6 C)  98.2 F (36.8 C)  TempSrc: Oral Oral  Oral  SpO2: 96% 96%  96%  Weight:      Height:        Intake/Output Summary (Last 24 hours) at 02/10/2018 1719 Last data filed at 02/10/2018 1311 Gross per 24 hour  Intake 960 ml  Output -  Net 960 ml   Filed Weights   02/07/18 0329  Weight: 104.3 kg    Examination: General exam: Appears comfortable  HEENT: PERRLA, oral mucosa moist, no sclera icterus or thrush Respiratory system: Clear to auscultation. Respiratory effort normal. Cardiovascular system: S1 & S2 heard, RRR.   Gastrointestinal system: Abdomen soft, non-tender, nondistended. Normal bowel sounds. Central nervous system: Alert and oriented. No focal neurological deficits. Extremities: No cyanosis, clubbing or edema Skin: No rashes or ulcers MSK: tender in right flank Psychiatry:  Mood & affect appropriate.     Data Reviewed: I have personally reviewed following labs and imaging studies  CBC: Recent Labs  Lab 02/04/18  2006 02/07/18 0509 02/08/18 0639  WBC 8.6 8.6 5.6  HGB 13.4 13.5 11.5*  HCT 44.2 45.0 39.1  MCV 90.4 90.9 92.2  PLT 332 315 286   Basic Metabolic Panel: Recent Labs  Lab 02/04/18 2006 02/07/18 0509 02/08/18 0639  NA 143 140 142  K 3.5 3.4* 3.6  CL 103 103 108  CO2 29 29 28   GLUCOSE 254* 185* 144*  BUN 10 13 9   CREATININE 0.76 0.85 0.72  CALCIUM 9.4 9.2 8.7*   GFR: Estimated Creatinine Clearance: 96.5 mL/min (by C-G formula based on SCr of 0.72  mg/dL). Liver Function Tests: Recent Labs  Lab 02/05/18 0514 02/07/18 0509 02/08/18 0639  AST 12* 11* 10*  ALT 12 11 10   ALKPHOS 120 134* 110  BILITOT 0.3 0.4 0.4  PROT 7.0 7.6 5.9*  ALBUMIN 3.5 3.8 3.1*   Recent Labs  Lab 02/05/18 0514 02/07/18 0509 02/08/18 0639  LIPASE 128* 71* 35   No results for input(s): AMMONIA in the last 168 hours. Coagulation Profile: No results for input(s): INR, PROTIME in the last 168 hours. Cardiac Enzymes: No results for input(s): CKTOTAL, CKMB, CKMBINDEX, TROPONINI in the last 168 hours. BNP (last 3 results) No results for input(s): PROBNP in the last 8760 hours. HbA1C: No results for input(s): HGBA1C in the last 72 hours. CBG: Recent Labs  Lab 02/09/18 1740 02/09/18 2201 02/10/18 0727 02/10/18 1213 02/10/18 1703  GLUCAP 100* 143* 103* 257* 47*   Lipid Profile: Recent Labs    02/08/18 0639  CHOL 135  HDL 27*  LDLCALC 90  TRIG 88  CHOLHDL 5.0   Thyroid Function Tests: No results for input(s): TSH, T4TOTAL, FREET4, T3FREE, THYROIDAB in the last 72 hours. Anemia Panel: No results for input(s): VITAMINB12, FOLATE, FERRITIN, TIBC, IRON, RETICCTPCT in the last 72 hours. Urine analysis:    Component Value Date/Time   COLORURINE YELLOW 02/07/2018 0705   APPEARANCEUR CLEAR 02/07/2018 0705   LABSPEC 1.008 02/07/2018 0705   PHURINE 6.0 02/07/2018 0705   GLUCOSEU 50 (A) 02/07/2018 0705   HGBUR NEGATIVE 02/07/2018 0705   BILIRUBINUR NEGATIVE 02/07/2018 0705   KETONESUR NEGATIVE 02/07/2018 0705   PROTEINUR NEGATIVE 02/07/2018 0705   UROBILINOGEN 1.0 04/22/2012 1416   NITRITE NEGATIVE 02/07/2018 0705   LEUKOCYTESUR NEGATIVE 02/07/2018 0705   Sepsis Labs: @LABRCNTIP (procalcitonin:4,lacticidven:4) )No results found for this or any previous visit (from the past 240 hour(s)).       Radiology Studies: No results found.    Scheduled Meds: . amLODipine  10 mg Oral Daily  . aspirin  81 mg Oral Daily  . atorvastatin  80 mg  Oral q1800  . buPROPion  150 mg Oral Daily  . carvedilol  12.5 mg Oral BID WC  . cloNIDine  0.2 mg Oral Daily  . clopidogrel  75 mg Oral Daily  . dextrose  25 mL Intravenous Once  . diclofenac sodium  4 g Topical QID  . enoxaparin (LOVENOX) injection  40 mg Subcutaneous Daily  . gabapentin  300 mg Oral TID  . insulin aspart  0-15 Units Subcutaneous TID WC  . lisinopril  40 mg Oral QHS  . naloxegol oxalate  25 mg Oral QAC breakfast  . nicotine  7 mg Transdermal Daily  . pantoprazole sodium  40 mg Oral Daily  . polyethylene glycol  17 g Oral Daily  . vitamin B-12  1,000 mcg Oral Daily  . Vitamin D (Ergocalciferol)  50,000 Units Oral Q7 days   Continuous Infusions:  LOS: 3 days    Time spent in minutes: 35    Calvert Cantor, MD [facility] Pager: [email or web address] Password [email or web address] 02/10/2018, 5:19 PM

## 2018-02-11 DIAGNOSIS — G8929 Other chronic pain: Secondary | ICD-10-CM

## 2018-02-11 DIAGNOSIS — K5900 Constipation, unspecified: Secondary | ICD-10-CM

## 2018-02-11 DIAGNOSIS — M5440 Lumbago with sciatica, unspecified side: Secondary | ICD-10-CM

## 2018-02-11 DIAGNOSIS — E785 Hyperlipidemia, unspecified: Secondary | ICD-10-CM

## 2018-02-11 DIAGNOSIS — Z9861 Coronary angioplasty status: Secondary | ICD-10-CM

## 2018-02-11 DIAGNOSIS — I2510 Atherosclerotic heart disease of native coronary artery without angina pectoris: Secondary | ICD-10-CM

## 2018-02-11 LAB — GLUCOSE, CAPILLARY
GLUCOSE-CAPILLARY: 176 mg/dL — AB (ref 70–99)
Glucose-Capillary: 134 mg/dL — ABNORMAL HIGH (ref 70–99)

## 2018-02-11 MED ORDER — ASPIRIN 81 MG PO CHEW: 81.0000 mg | CHEWABLE_TABLET | Freq: Every day | ORAL | Status: DC

## 2018-02-11 MED ORDER — INSULIN GLARGINE 100 UNIT/ML ~~LOC~~ SOLN: 50.0000 [IU] | Freq: Two times a day (BID) | SUBCUTANEOUS | Status: DC

## 2018-02-11 MED ORDER — GLYCERIN (LAXATIVE) 2.1 G RE SUPP
1.0000 | Freq: Once | RECTAL | Status: AC
  Administered 2018-02-11: 1 via RECTAL
  Filled 2018-02-11: qty 1

## 2018-02-11 MED ORDER — NICOTINE 7 MG/24HR TD PT24: 7.0000 mg | MEDICATED_PATCH | Freq: Every day | TRANSDERMAL | 0 refills | Status: DC

## 2018-02-11 MED ORDER — DICLOFENAC SODIUM 1 % TD GEL: 4.0000 g | Freq: Four times a day (QID) | TRANSDERMAL | 0 refills | Status: AC

## 2018-02-11 MED ORDER — GLYCERIN (LAXATIVE) 2.1 G RE SUPP: 1.0000 | Freq: Every day | RECTAL | 0 refills | Status: DC | PRN

## 2018-02-11 MED ORDER — POLYETHYLENE GLYCOL 3350 17 G PO PACK: 17.0000 g | PACK | Freq: Every day | ORAL | 0 refills | Status: AC | PRN

## 2018-02-11 NOTE — Care Management Note (Signed)
Case Management Note  Patient Details  Name: Miranda [name] MRN: [id number] Date of Birth: 28-May-1955  Subjective/Objective:                  Discharged  Action/Plan: Discharged to home with self-care, orders checked for hhc needs. No CM needs present at time of discharge.  Expected Discharge Date:  02/11/18               Expected Discharge Plan:  Home/Self Care  In-House Referral:     Discharge planning Services  CM Consult  Post Acute Care Choice:    Choice offered to:     DME Arranged:    DME Agency:     HH Arranged:    HH Agency:     Status of Service:  Completed, signed off  If discussed at [employer] of Stay Meetings, dates discussed:    Additional Comments:  Golda Acre, RN 02/11/2018, 10:39 AM

## 2018-02-11 NOTE — Discharge Instructions (Signed)
If your BP rises > 160/90, resume your Amlodipine first. If it still remains high, call you PCP.  You will need an MRI of your Pancreas in about 4-6 wks. to make sur you don't have a mass on it. Your PCP will need to order this.   You were cared for by a hospitalist during your hospital stay. If you have any questions about your discharge medications or the care you received while you were in the hospital after you are discharged, you can call the unit and asked to speak with the hospitalist on call if the hospitalist that took care of you is not available. Once you are discharged, your primary care physician will handle any further medical issues.   Please note that NO REFILLS for any discharge medications will be authorized once you are discharged, as it is imperative that you return to your primary care physician (or establish a relationship with a primary care physician if you do not have one) for your aftercare needs so that they can reassess your need for medications and monitor your lab values.  Please take all your medications with you for your next visit with your Primary MD. Please ask your Primary MD to get all Hospital records sent to his/her office. Please request your Primary MD to go over all hospital test results at the follow up.   If you experience worsening of your admission symptoms, develop shortness of breath, chest pain, suicidal or homicidal thoughts or a life threatening emergency, you must seek medical attention immediately by calling 911 or calling your MD.   Bonita Quin must read the complete instructions/literature along with all the possible adverse reactions/side effects for all the medicines you take including new medications that have been prescribed to you. Take new medicines after you have completely understood and accpet all the possible adverse reactions/side effects.    Do not drive when taking pain medications or sedatives.     Do not take more than prescribed Pain,  Sleep and Anxiety Medications   If you have smoked or chewed Tobacco in the last 2 yrs please stop. Stop any regular alcohol  and or recreational drug use.   Wear Seat belts while driving.

## 2018-02-11 NOTE — Discharge Summary (Addendum)
Physician Discharge Summary  Miranda [name]:[id number] DOB: 06/26/1955 DOA: 02/07/2018  PCP: [name], MD  Admit date: 02/07/2018 Discharge date: 02/11/2018  Admitted From: home  Disposition:  home   Recommendations for Outpatient Follow-up: 1 1 will need MRI as outpt of the pancreas in regards to posible Uncinate enlargement  2. F/u BP and resume medications as needed  Discharge Condition:  stable   CODE STATUS:  Full code Consultations:  none    Discharge Diagnoses:  Principal Problem:   Acute pancreatitis Active Problems:   Essential hypertension   Insulin dependent diabetes mellitus (HCC)   Dyslipidemia   CAD S/P percutaneous coronary angioplasty   Tobacco abuse   Chronic back pain   Peripheral neuropathy   Acute right-sided low back pain without sciatica         Brief Summary: Miranda [name] is an 62 y.o.femalepast medical history of diabetes mellitus type 2, essential hypertension, chronic diastolic heart failure, stroke, who was recently seen in the ED on 02/05/2018 for abdominal pain and had negative troponin & LFTs. Lipase was 128 and she had a RUQ ultrasound which was unrevealing. She was given IVF and was advised to take a liquid diet and discharged home. She returned within 24 hrs for ongoing abdominal pain.  She also notes a 10 lb wt loss in 1 month. CT abd/pelvis:  Inflamed appearing proximal duodenum, and subtle inflammation about the pancreatic head on series 2, image 35. Mild Acute Pancreatitis suspected. 2. 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 RECOMMEND follow-up outpatient Abdomen MRI (pancreatic protocol without and with contrast) in 4-6 weeks to exclude the possibility of an uncinate tumor.  Hospital Course:  Principal Problem:   Acute pancreatitis - ? Due to Semaglutide or Onglyza- will be d/cing these for now- discussed with pat - tolerating advancement in diet- asked to take low  fat diet in addition to low carb - will need MRI as outpt of the pancrease in regards to posible Uncinate enlargement   Active Problems:   Essential hypertension - cont Coreg, Clonidine and Amlodipine - Lisinopril on hold while in the hospital- BP dropped last night into 90s and she felt lightheaded- will hold Clonidine and Amlodipine as well and have her check BP BID and take record to see PCP on Monday    Insulin dependent diabetes mellitus  - hypoglycemic to 47 yesterday only on SSI while on a clear liquid diet- Lantus was stopped on 10/28 - she takes Lantus 50 U BID at home, Metformin, Saxagliptan and Semaglutide all of which are on hold - as mentioned, will need to hold Saxagliptan and Semaglutide for now  Right lower back pain - muscular-   Voltaren gel and k pad has helped    Tobacco abuse - Nicotine patch ordered- counseled   H/o CVA - Plavix, Lipitor  Diabetic neuropathy - Gabapentin, Oxycodone  Constipation - Movantik, Miralax, PRN glycerin suppository    Peripheral neuropathy - gabapentin  CAD s/p PTCA - resume Aspirin   Discharge Exam: Vitals:   02/11/18 0529 02/11/18 0831  BP: 121/62 (!) 142/76  Pulse: (!) 58   Resp: (!) 21   Temp: 98.1 F (36.7 C)   SpO2: 90%    Vitals:   02/10/18 2050 02/10/18 2132 02/11/18 0529 02/11/18 0831  BP: (!) 92/59 104/60 121/62 (!) 142/76  Pulse: 65 64 (!) 58   Resp: 20  (!) 21   Temp: 98.7 F (37.1 C)  98.1 F (  36.7 C)   TempSrc: Oral  Oral   SpO2: 95%  90%   Weight:      Height:        General: Pt is alert, awake, not in acute distress Cardiovascular: RRR, S1/S2 +, no rubs, no gallops Respiratory: CTA bilaterally, no wheezing, no rhonchi Abdominal: Soft, NT, ND, bowel sounds + Extremities: no edema, no cyanosis   Discharge Instructions  Discharge Instructions    Diet - low sodium heart healthy   Complete by:  As directed    Low fat diet   Diet Carb Modified   Complete by:  As directed     Increase activity slowly   Complete by:  As directed      Allergies as of 02/11/2018   No Known Allergies     Medication List    STOP taking these medications   amLODipine 10 MG tablet Commonly known as:  NORVASC   cloNIDine 0.2 MG tablet Commonly known as:  CATAPRES   lisinopril 40 MG tablet Commonly known as:  PRINIVIL,ZESTRIL   ONGLYZA 2.5 MG Tabs tablet Generic drug:  saxagliptin HCl   OZEMPIC (0.25 OR 0.5 MG/DOSE) 2 MG/1.5ML Sopn Generic drug:  Semaglutide(0.25 or 0.5MG /DOS)     TAKE these medications   ACCU-CHEK AVIVA PLUS test strip Generic drug:  glucose blood 1 each as needed.   albuterol 108 (90 Base) MCG/ACT inhaler Commonly known as:  PROVENTIL HFA;VENTOLIN HFA Inhale 2 puffs into the lungs every 6 (six) hours as needed for wheezing or shortness of breath.   aspirin 81 MG chewable tablet Chew 1 tablet (81 mg total) by mouth daily.   atorvastatin 80 MG tablet Commonly known as:  LIPITOR TAKE 1 TABLET EVERY DAY AT 6 PM What changed:  See the new instructions.   B-12 1000 MCG Caps Take 1,000 mcg by mouth daily.   B-D ULTRAFINE III SHORT PEN 31G X 8 MM Misc Generic drug:  Insulin Pen Needle 1 each as needed.   buPROPion 150 MG 12 hr tablet Commonly known as:  WELLBUTRIN SR Take 150 mg by mouth daily.   carvedilol 12.5 MG tablet Commonly known as:  COREG TAKE 1 TABLET (12.5 MG TOTAL) BY MOUTH 2 (TWO) TIMES DAILY WITH A MEAL. NEED OV. What changed:  additional instructions   clopidogrel 75 MG tablet Commonly known as:  PLAVIX Take 1 tablet (75 mg total) by mouth daily.   diclofenac sodium 1 % Gel Commonly known as:  VOLTAREN Apply 4 g topically 4 (four) times daily.   gabapentin 300 MG capsule Commonly known as:  NEURONTIN Take 300 mg by mouth 3 (three) times daily.   Glycerin (Adult) 2.1 g Supp Place 1 suppository rectally daily as needed for moderate constipation.   insulin glargine 100 UNIT/ML injection Commonly known as:   LANTUS Inject 0.5 mLs (50 Units total) into the skin 2 (two) times daily.   metFORMIN 1000 MG (MOD) 24 hr tablet Commonly known as:  GLUMETZA Take 1 tablet (1,000 mg total) by mouth 2 (two) times daily with a meal. Restart tomorrow   MOVANTIK 25 MG Tabs tablet Generic drug:  naloxegol oxalate Take 25 mg by mouth daily before breakfast.   nicotine 7 mg/24hr patch Commonly known as:  NICODERM CQ - dosed in mg/24 hr Place 1 patch (7 mg total) onto the skin daily.   ondansetron 4 MG disintegrating tablet Commonly known as:  ZOFRAN-ODT Take 1 tablet (4 mg total) by mouth every 6 (six) hours as  needed.   oxyCODONE-acetaminophen 7.5-325 MG tablet Commonly known as:  PERCOCET Take 1 tablet by mouth every 4 (four) hours as needed for severe pain.   polyethylene glycol packet Commonly known as:  MIRALAX / GLYCOLAX Take 17 g by mouth daily as needed.   traZODone 50 MG tablet Commonly known as:  DESYREL Take 50 mg by mouth at bedtime as needed for sleep.   Vitamin D (Ergocalciferol) 50000 units Caps capsule Commonly known as:  DRISDOL Take 50,000 Units by mouth every 7 (seven) days. monday      Follow-up Information    [name], MD. Schedule an appointment as soon as possible for a visit.   Specialty:  Internal Medicine Why:  see PCP by Monday for BP check- take your documeted BPs with you Contact information: [address] [place] [address] [phone]          No Known Allergies   Procedures/Studies:    Dg Chest 2 View  Result Date: 02/04/2018 CLINICAL DATA:  Chest pain EXAM: CHEST - 2 VIEW COMPARISON:  11/10/2017 FINDINGS: No focal opacity or pleural effusion. Normal heart size. Aortic atherosclerosis. No pneumothorax. IMPRESSION: No active cardiopulmonary disease. Electronically Signed   By: Jasmine Pang M.D.   On: 02/04/2018 21:11   Ct Abdomen Pelvis W Contrast  Result Date: 02/07/2018 CLINICAL DATA:  62 year old female with 4 days of chest  pain, right upper quadrant and epigastric pain. Abdominal distension. EXAM: CT ABDOMEN AND PELVIS WITH CONTRAST TECHNIQUE: Multidetector CT imaging of the abdomen and pelvis was performed using the standard protocol following bolus administration of intravenous contrast. CONTRAST:  ISOVUE-300 IOPAMIDOL (ISOVUE-300) INJECTION 61% COMPARISON:  Lumbar MRI 01/24/2017. FINDINGS: Lower chest: Mild cardiomegaly. No pericardial effusion. No pleural effusion. Mild lung base atelectasis. Hepatobiliary: Negative liver and gallbladder. Pancreas: On series 2, image 35 at the level of the pancreatic head there is an indistinct appearance of the duodenum and suggestion of mild regional fat stranding. See also coronal image 68 where the 1st and 2nd portion of the duodenum appear indistinct. Pancreatic uncinate appears mildly lobulated on coronal image 60, and the uncinate seems smaller on prior lumbar MRIs in 2018, and also 2009. There is no peripancreatic lymphadenopathy, although there is mild dependent retroperitoneal stranding just caudal to the duodenum and a maximal retroperitoneal lymph node on series 2, image 53. The body and tail of the pancreas have a more normal appearance. Spleen: Negative. Adrenals/Urinary Tract: Normal adrenal glands. Bilateral renal enhancement and contrast excretion is symmetric and within normal limits. No perinephric stranding. No hydroureter.  Unremarkable urinary bladder. Stomach/Bowel: Negative rectum and distal sigmoid colon. The proximal sigmoid is redundant with moderate diverticulosis which continues into the descending colon. No active inflammation is identified. Redundant transverse colon. Redundant hepatic flexure with retained stool. Retained stool in the right colon. Normal appendix on series 2, image 66. Negative terminal ileum. No dilated small bowel. There is lower rectus muscle diastasis (series 2, image 67) with subjacent small bowel and mesentery, but no overt herniation of  bowel. Negative stomach. Duodenum described above. No abdominal free air, free fluid. Vascular/Lymphatic: Aortoiliac calcified atherosclerosis. Major arterial structures remain patent. Portal venous system is patent. Reproductive: Negative. Other: No pelvic free fluid. Musculoskeletal: Lower lumbar facet degeneration. Bilateral L4-L5 and L5-S1 vacuum facet. No acute osseous abnormality identified. IMPRESSION: 1. Inflamed appearing proximal duodenum, and subtle inflammation about the pancreatic head on series 2, image 35. Mild Acute Pancreatitis suspected. 2. Furthermore, questionable enlargement of the pancreatic uncinate  from prior lumbar MRIs. RECOMMEND follow-up outpatient Abdomen MRI (pancreatic protocol without and with contrast) in 4-6 weeks to exclude the possibility of an uncinate tumor. 3. Diverticulosis of the left colon without active inflammation. Normal appendix. Aortic Atherosclerosis (ICD10-I70.0). Electronically Signed   By: Odessa Fleming M.D.   On: 02/07/2018 08:15   US Abdomen Limited Ruq  Result Date: 02/05/2018 CLINICAL DATA:  Acute right upper quadrant abdominal pain. EXAM: ULTRASOUND ABDOMEN LIMITED RIGHT UPPER QUADRANT COMPARISON:  None. FINDINGS: Gallbladder: No gallstones or wall thickening visualized. No sonographic Murphy sign noted by sonographer. Common bile duct: Diameter: 3 mm which is within normal limits. Liver: No focal lesion identified. Within normal limits in parenchymal echogenicity. Portal vein is patent on color Doppler imaging with normal direction of blood flow towards the liver. IMPRESSION: No abnormality seen in the right upper quadrant of the abdomen. Electronically Signed   By: Lupita Raider, M.D.   On: 02/05/2018 07:23     The results of significant diagnostics from this hospitalization (including imaging, microbiology, ancillary and laboratory) are listed below for reference.     Microbiology: No results found for this or any previous visit (from the past 240  hour(s)).   Labs: BNP (last 3 results) No results for input(s): BNP in the last 8760 hours. Basic Metabolic Panel: Recent Labs  Lab 02/04/18 2006 02/07/18 0509 02/08/18 0639  NA 143 140 142  K 3.5 3.4* 3.6  CL 103 103 108  CO2 29 29 28   GLUCOSE 254* 185* 144*  BUN 10 13 9   CREATININE 0.76 0.85 0.72  CALCIUM 9.4 9.2 8.7*   Liver Function Tests: Recent Labs  Lab 02/05/18 0514 02/07/18 0509 02/08/18 0639  AST 12* 11* 10*  ALT 12 11 10   ALKPHOS 120 134* 110  BILITOT 0.3 0.4 0.4  PROT 7.0 7.6 5.9*  ALBUMIN 3.5 3.8 3.1*   Recent Labs  Lab 02/05/18 0514 02/07/18 0509 02/08/18 0639  LIPASE 128* 71* 35   No results for input(s): AMMONIA in the last 168 hours. CBC: Recent Labs  Lab 02/04/18 2006 02/07/18 0509 02/08/18 0639  WBC 8.6 8.6 5.6  HGB 13.4 13.5 11.5*  HCT 44.2 45.0 39.1  MCV 90.4 90.9 92.2  PLT 332 315 286   Cardiac Enzymes: No results for input(s): CKTOTAL, CKMB, CKMBINDEX, TROPONINI in the last 168 hours. BNP: Invalid input(s): POCBNP CBG: Recent Labs  Lab 02/10/18 1213 02/10/18 1703 02/10/18 1741 02/10/18 2050 02/11/18 0737  GLUCAP 257* 47* 84 144* 134*   D-Dimer No results for input(s): DDIMER in the last 72 hours. Hgb A1c No results for input(s): HGBA1C in the last 72 hours. Lipid Profile No results for input(s): CHOL, HDL, LDLCALC, TRIG, CHOLHDL, LDLDIRECT in the last 72 hours. Thyroid function studies No results for input(s): TSH, T4TOTAL, T3FREE, THYROIDAB in the last 72 hours.  Invalid input(s): FREET3 Anemia work up No results for input(s): VITAMINB12, FOLATE, FERRITIN, TIBC, IRON, RETICCTPCT in the last 72 hours. Urinalysis    Component Value Date/Time   COLORURINE YELLOW 02/07/2018 0705   APPEARANCEUR CLEAR 02/07/2018 0705   LABSPEC 1.008 02/07/2018 0705   PHURINE 6.0 02/07/2018 0705   GLUCOSEU 50 (A) 02/07/2018 0705   HGBUR NEGATIVE 02/07/2018 0705   BILIRUBINUR NEGATIVE 02/07/2018 0705   KETONESUR NEGATIVE  02/07/2018 0705   PROTEINUR NEGATIVE 02/07/2018 0705   UROBILINOGEN 1.0 04/22/2012 1416   NITRITE NEGATIVE 02/07/2018 0705   LEUKOCYTESUR NEGATIVE 02/07/2018 0705   Sepsis Labs Invalid input(s): PROCALCITONIN,  WBC,  LACTICIDVEN Microbiology No results found for this or any previous visit (from the past 240 hour(s)).   Time coordinating discharge in minutes: 60  SIGNED:   Calvert Cantor, MD  [facility] 02/11/2018, 10:14 AM Pager   If 7PM-7AM, please contact night-coverage [email or web address] Password [email or web address]

## 2018-02-17 ENCOUNTER — Other Ambulatory Visit: Payer: Self-pay | Admitting: Cardiovascular Disease

## 2018-02-20 ENCOUNTER — Other Ambulatory Visit: Payer: Self-pay | Admitting: Cardiovascular Disease

## 2018-02-22 NOTE — Telephone Encounter (Signed)
Rx has been sent to the pharmacy electronically. ° °

## 2018-02-23 ENCOUNTER — Ambulatory Visit: Payer: Medicaid Other | Admitting: Gastroenterology

## 2018-03-05 ENCOUNTER — Other Ambulatory Visit: Payer: Self-pay | Admitting: Cardiology

## 2018-03-20 ENCOUNTER — Other Ambulatory Visit: Payer: Self-pay | Admitting: Cardiovascular Disease

## 2018-03-23 ENCOUNTER — Ambulatory Visit: Payer: Medicaid Other | Admitting: Gastroenterology

## 2018-03-24 ENCOUNTER — Telehealth (INDEPENDENT_AMBULATORY_CARE_PROVIDER_SITE_OTHER): Payer: Self-pay

## 2018-03-24 NOTE — Telephone Encounter (Signed)
Received PRF from [facility].  Faxed completed PRF form to [facility] at [phone].

## 2018-04-27 ENCOUNTER — Other Ambulatory Visit: Payer: Self-pay | Admitting: Cardiovascular Disease

## 2018-08-15 ENCOUNTER — Other Ambulatory Visit: Payer: Self-pay | Admitting: Cardiovascular Disease

## 2018-09-22 ENCOUNTER — Telehealth: Payer: Self-pay | Admitting: *Deleted

## 2018-09-22 NOTE — Telephone Encounter (Signed)
A message was left, re: follow up visit. 

## 2018-11-10 ENCOUNTER — Other Ambulatory Visit: Payer: Self-pay

## 2018-11-10 ENCOUNTER — Encounter: Payer: Self-pay | Admitting: Orthopaedic Surgery

## 2018-11-10 ENCOUNTER — Ambulatory Visit (INDEPENDENT_AMBULATORY_CARE_PROVIDER_SITE_OTHER): Payer: Medicaid Other | Admitting: Orthopaedic Surgery

## 2018-11-10 ENCOUNTER — Telehealth: Payer: Self-pay

## 2018-11-10 DIAGNOSIS — M1711 Unilateral primary osteoarthritis, right knee: Secondary | ICD-10-CM | POA: Diagnosis not present

## 2018-11-10 MED ORDER — LIDOCAINE HCL 1 % IJ SOLN
3.0000 mL | INTRAMUSCULAR | Status: AC | PRN
  Administered 2018-11-10: 3 mL

## 2018-11-10 MED ORDER — METHYLPREDNISOLONE ACETATE 40 MG/ML IJ SUSP
40.0000 mg | INTRAMUSCULAR | Status: AC | PRN
  Administered 2018-11-10: 40 mg via INTRA_ARTICULAR

## 2018-11-10 NOTE — Progress Notes (Signed)
   Procedure Note  Patient: Miranda [name]             Date of Birth: [birth year]           MRN: [id number]             Visit Date: 11/10/2018 HPI: Mrs. Miranda [name] is well-known to our department service comes in today requesting injections in both knees.  She has known severe arthritis of both knees.  However she is diabetic and reports her glucose levels can run up into the 200s.  She has had no new injury to either knee.  She is also asking about her shoulder which she is states that she has had x-rays at [facility] clinic and thought that she is to come here for an injection of her shoulder.  These radiographs are not available today.  She is had no known injury to the shoulder the shoulder overall extremity towards improvement. Review of systems: No fevers chills shortness of breath chest pain.  Please see HPI otherwise negative or noncontributory.  Right knee good range of motion.  Tenderness along medial lateral joint line.  No abnormal warmth erythema or effusion. Procedures: Visit Diagnoses:  1. Primary osteoarthritis of right knee     Large Joint Inj on 11/10/2018 11:42 AM Indications: pain Details: 22 G 1.5 in needle, anterolateral approach  Arthrogram: No  Medications: 3 mL lidocaine 1 %; 40 mg methylPREDNISolone acetate 40 MG/ML Outcome: tolerated well, no immediate complications Procedure, treatment alternatives, risks and benefits explained, specific risks discussed. Consent was given by the patient. Immediately prior to procedure a time out was called to verify the correct patient, procedure, equipment, support staff and site/side marked as required. Patient was prepped and draped in the usual sterile fashion.    Plan: Explained to patient that we should only perform 1 injection today due to the fact that cortisone could raise her glucose levels tremendously.  She is to be mindful of her glucose levels over the next few days.  We will see her back in 3 weeks hopefully at that time  we will begin supplemental injections for both knees.  In regards to her shoulder I gave her some handouts on some shoulder exercises if we are able to do supplemental injections next office visit and she is still having pain in the shoulder I would recommend repeat radiographs and also possible cortisone injection in the shoulder at that time.  Questions were encouraged and answered

## 2018-11-10 NOTE — Telephone Encounter (Signed)
Talked with Margaretha Sheffield with [facility] and was advised that Milam for bilateral knee was never shipped to our office.  Per Margaretha Sheffield, a new order has been submitted to have Monovisc shipped to our office and that it can take up to 5-7 business days.

## 2018-12-06 ENCOUNTER — Ambulatory Visit: Payer: Medicaid Other | Admitting: Orthopaedic Surgery

## 2018-12-30 ENCOUNTER — Ambulatory Visit (INDEPENDENT_AMBULATORY_CARE_PROVIDER_SITE_OTHER): Payer: Medicaid Other | Admitting: Orthopaedic Surgery

## 2018-12-30 ENCOUNTER — Telehealth: Payer: Self-pay

## 2018-12-30 ENCOUNTER — Encounter: Payer: Self-pay | Admitting: Orthopaedic Surgery

## 2018-12-30 DIAGNOSIS — M1711 Unilateral primary osteoarthritis, right knee: Secondary | ICD-10-CM

## 2018-12-30 DIAGNOSIS — M1712 Unilateral primary osteoarthritis, left knee: Secondary | ICD-10-CM | POA: Diagnosis not present

## 2018-12-30 DIAGNOSIS — M25561 Pain in right knee: Secondary | ICD-10-CM

## 2018-12-30 DIAGNOSIS — M25562 Pain in left knee: Secondary | ICD-10-CM

## 2018-12-30 DIAGNOSIS — G8929 Other chronic pain: Secondary | ICD-10-CM | POA: Diagnosis not present

## 2018-12-30 MED ORDER — LIDOCAINE HCL 1 % IJ SOLN
3.0000 mL | INTRAMUSCULAR | Status: AC | PRN
  Administered 2018-12-30: 3 mL

## 2018-12-30 MED ORDER — METHYLPREDNISOLONE ACETATE 40 MG/ML IJ SUSP
40.0000 mg | INTRAMUSCULAR | Status: AC | PRN
  Administered 2018-12-30: 40 mg via INTRA_ARTICULAR

## 2018-12-30 NOTE — Progress Notes (Signed)
Office Visit Note   Patient: Miranda ButtersChandra D [name]           Date of Birth: Jul 19, 1955           MRN: [id number] Visit Date: 12/30/2018              Requested by: Fleet ContrasAvbuere, Edwin, MD [address] [place] [id number] PCP: Fleet ContrasAvbuere, Edwin, MD   Assessment & Plan: Visit Diagnoses:  1. Chronic pain of left knee   2. Chronic pain of right knee   3. Unilateral primary osteoarthritis, left knee   4. Unilateral primary osteoarthritis, right knee     Plan: Per her wishes I did provide a steroid injection just in the left knee today.  Hyaluronic acid is worked so well for her in the past that she would like to have this for both knees in the near future.  We will order hyaluronic acid for both knees to treat the pain from osteoarthritis.  All question concerns were answered and addressed.  Hopefully in 4 weeks from now we can place gel shots in both knees.  Follow-Up Instructions: Return in about 4 weeks (around 01/27/2019).   Orders:  No orders of the defined types were placed in this encounter.  No orders of the defined types were placed in this encounter.     Procedures: Large Joint Inj: L knee on 12/30/2018 3:01 PM Indications: diagnostic evaluation and pain Details: 22 G 1.5 in needle, superolateral approach  Arthrogram: No  Medications: 3 mL lidocaine 1 %; 40 mg methylPREDNISolone acetate 40 MG/ML Outcome: tolerated well, no immediate complications Procedure, treatment alternatives, risks and benefits explained, specific risks discussed. Consent was given by the patient. Immediately prior to procedure a time out was called to verify the correct patient, procedure, equipment, support staff and site/side marked as required. Patient was prepped and draped in the usual sterile fashion.       Clinical Data: No additional findings.   Subjective: Chief Complaint  Patient presents with  . Left Knee - Follow-up  The patient comes in with a history of known bilateral knee  osteoarthritis and degenerative joint disease.  From time to time she has had steroid injections in both knees.  She is also had hyaluronic acid in both knees.  She says the left knee is really flared up today.  Is been a year since she is had any type of injection.  She would like to have a steroid injection her left knee today and then hopefully a month from now have hyaluronic acid for both knees.  I feel that this is reasonable plan based on the history of had with her with her knee osteoarthritis and pain.  She denies any acute changes in her medical status other than slowly worsening bilateral knee pain with left worse than right.  HPI  Review of Systems She currently denies any headache, chest pain, shortness of breath, fever, chills, nausea, vomiting  Objective: Vital Signs: There were no vitals taken for this visit.  Physical Exam She is alert and orient x3 and in no acute distress Ortho Exam Examination of both knees show some patellofemoral crepitation with medial joint line tenderness bilaterally and varus malalignment.  Both knees have good range of motion and no effusion today. Specialty Comments:  No specialty comments available.  Imaging: No results found.   PMFS History: Patient Active Problem List   Diagnosis Date Noted  . Acute right-sided low back pain without sciatica   .  Peripheral neuropathy 02/08/2018  . Acute pancreatitis 02/07/2018  . CAD S/P percutaneous coronary angioplasty 01/29/2017  . Tobacco abuse 01/29/2017  . Chronic back pain 01/29/2017  . Hypoglycemia 12/30/2016  . Insulin dependent diabetes mellitus ([place]) 11/21/2015  . Dyslipidemia 11/21/2015  . NSTEMI (non-ST elevated myocardial infarction) ([facility])   . TIA (transient ischemic attack) 05/31/2014  . History of stroke 04/22/2012  . Hypokalemia 04/22/2012  . Diabetes mellitus ([facility]) 04/22/2012  . Essential hypertension 04/22/2012   Past Medical History:  Diagnosis Date  . Acid reflux   .  Anxiety   . Asthma   . Degenerative disc disease   . Depression   . Diabetes mellitus   . Hypertension   . Hypoglycemia 12/30/2016  . Stroke [facility])    04/22/12    Family History  Problem Relation Age of Onset  . Hypertension Other     Past Surgical History:  Procedure Laterality Date  . CARDIAC CATHETERIZATION N/A 11/21/2015   Procedure: Left Heart Cath and Coronary Angiography;  Surgeon: Peter M Martinique, MD;  Location: [facility];  Service: Cardiovascular;  Laterality: N/A;  . CARDIAC CATHETERIZATION N/A 11/21/2015   Procedure: Coronary Stent Intervention;  Surgeon: Peter M Martinique, MD;  Location: [facility];  Service: Cardiovascular;  Laterality: N/A;  . KNEE SURGERY    . TUBAL LIGATION     Social History   Occupational History  . Not on file  Tobacco Use  . Smoking status: Current Every Day Smoker    Packs/day: 0.25    Years: 45.00    Pack years: 11.25    Types: Cigarettes  . Smokeless tobacco: Never Used  Substance and Sexual Activity  . Alcohol use: No  . Drug use: No  . Sexual activity: Never

## 2018-12-30 NOTE — Telephone Encounter (Signed)
Noted  

## 2018-12-30 NOTE — Telephone Encounter (Signed)
Bilateral knee gel injections 

## 2018-12-31 ENCOUNTER — Telehealth: Payer: Self-pay

## 2018-12-31 NOTE — Telephone Encounter (Signed)
Mailed out Programmer, applications for [employer], bilateral knee through [employer] Patient [employer].  Talked with Tiffany at [facility] and advised her that I never received Monovisc for patient's previous application.  Per Tiffany a new PRF form will be faxed to the office to complete and fax back to have medication shipped to the office for patient's previous approval.

## 2019-01-18 ENCOUNTER — Telehealth: Payer: Self-pay

## 2019-01-18 NOTE — Telephone Encounter (Signed)
Approved for Monovisc, bilateral knee injection. Purchased through [employer]

## 2019-01-27 ENCOUNTER — Ambulatory Visit: Payer: Medicaid Other | Admitting: Orthopaedic Surgery

## 2019-02-03 ENCOUNTER — Ambulatory Visit (INDEPENDENT_AMBULATORY_CARE_PROVIDER_SITE_OTHER): Payer: Medicaid Other | Admitting: Orthopaedic Surgery

## 2019-02-03 ENCOUNTER — Other Ambulatory Visit: Payer: Self-pay

## 2019-02-03 ENCOUNTER — Encounter: Payer: Self-pay | Admitting: Orthopaedic Surgery

## 2019-02-03 DIAGNOSIS — M1712 Unilateral primary osteoarthritis, left knee: Secondary | ICD-10-CM

## 2019-02-03 DIAGNOSIS — M1711 Unilateral primary osteoarthritis, right knee: Secondary | ICD-10-CM | POA: Diagnosis not present

## 2019-02-03 MED ORDER — HYALURONAN 88 MG/4ML IX SOSY
88.0000 mg | PREFILLED_SYRINGE | INTRA_ARTICULAR | Status: AC | PRN
  Administered 2019-02-03: 88 mg via INTRA_ARTICULAR

## 2019-02-03 NOTE — Progress Notes (Signed)
   Procedure Note  Patient: Miranda [name]             Date of Birth: 07-12-1955           MRN: [id number]             Visit Date: 02/03/2019  Procedures: Visit Diagnoses:  1. Unilateral primary osteoarthritis, left knee   2. Unilateral primary osteoarthritis, right knee     Large Joint Inj: R knee on 02/03/2019 4:06 PM Indications: diagnostic evaluation and pain Details: 22 G 1.5 in needle, superolateral approach  Arthrogram: No  Medications: 88 mg Hyaluronan 88 MG/4ML Outcome: tolerated well, no immediate complications Procedure, treatment alternatives, risks and benefits explained, specific risks discussed. Consent was given by the patient. Immediately prior to procedure a time out was called to verify the correct patient, procedure, equipment, support staff and site/side marked as required. Patient was prepped and draped in the usual sterile fashion.   Large Joint Inj: L knee on 02/03/2019 4:07 PM Indications: diagnostic evaluation and pain Details: 22 G 1.5 in needle, superolateral approach  Arthrogram: No  Medications: 88 mg Hyaluronan 88 MG/4ML Outcome: tolerated well, no immediate complications Procedure, treatment alternatives, risks and benefits explained, specific risks discussed. Consent was given by the patient. Immediately prior to procedure a time out was called to verify the correct patient, procedure, equipment, support staff and site/side marked as required. Patient was prepped and draped in the usual sterile fashion.    The patient is here today to treat osteoarthritis pain in both knees with hyaluronic acid.  She has had this in the past and that is what is helped her the most.  Today will be with Monovisc.  She has had no other acute change in her medical status.  She does have daily bilateral knee pain and well-documented osteoarthritis.  She has tried failed other conservative treatment measures including steroid injections.  Examination of both knees today  there is patellofemoral crepitation and global tenderness.  Both knees are ligamentously stable good range of motion but varus malalignment and are painful.  She tolerated the Monovisc injections well in both knees without difficulty.  All question concerns were answered and addressed.  Follow-up will be as needed.

## 2019-02-25 ENCOUNTER — Ambulatory Visit: Payer: Medicaid Other | Admitting: Cardiovascular Disease

## 2019-03-08 ENCOUNTER — Ambulatory Visit: Payer: Medicaid Other | Admitting: Cardiovascular Disease

## 2019-03-08 ENCOUNTER — Telehealth: Payer: Self-pay

## 2019-03-08 ENCOUNTER — Encounter: Payer: Self-pay | Admitting: Cardiovascular Disease

## 2019-03-08 ENCOUNTER — Other Ambulatory Visit: Payer: Self-pay

## 2019-03-08 VITALS — BP 131/80 | HR 69 | Temp 97.8°F | Ht 70.0 in | Wt 241.8 lb

## 2019-03-08 DIAGNOSIS — Z720 Tobacco use: Secondary | ICD-10-CM

## 2019-03-08 DIAGNOSIS — I10 Essential (primary) hypertension: Secondary | ICD-10-CM

## 2019-03-08 DIAGNOSIS — I4901 Ventricular fibrillation: Secondary | ICD-10-CM | POA: Diagnosis not present

## 2019-03-08 DIAGNOSIS — Z9861 Coronary angioplasty status: Secondary | ICD-10-CM | POA: Diagnosis not present

## 2019-03-08 DIAGNOSIS — I2510 Atherosclerotic heart disease of native coronary artery without angina pectoris: Secondary | ICD-10-CM

## 2019-03-08 DIAGNOSIS — E785 Hyperlipidemia, unspecified: Secondary | ICD-10-CM | POA: Diagnosis not present

## 2019-03-08 LAB — LIPID PANEL
Chol/HDL Ratio: 4.3 ratio (ref 0.0–4.4)
Cholesterol, Total: 162 mg/dL (ref 100–199)
HDL: 38 mg/dL — ABNORMAL LOW (ref >39)
LDL Chol Calc (NIH): 105 mg/dL — ABNORMAL HIGH (ref 0–99)
Triglycerides: 104 mg/dL (ref 0–149)
VLDL Cholesterol Cal: 19 mg/dL (ref 5–40)

## 2019-03-08 LAB — HEPATIC FUNCTION PANEL
ALT: 8 IU/L (ref 0–32)
AST: 10 IU/L (ref 0–40)
Albumin: 4.3 g/dL (ref 3.8–4.8)
Alkaline Phosphatase: 165 IU/L — ABNORMAL HIGH (ref 39–117)
Bilirubin Total: 0.4 mg/dL (ref 0.0–1.2)
Bilirubin, Direct: 0.07 mg/dL (ref 0.00–0.40)
Total Protein: 6.7 g/dL (ref 6.0–8.5)

## 2019-03-08 NOTE — Assessment & Plan Note (Signed)
History of dyslipidemia on high-dose statin therapy with lipid profile performed 01/31/2018 revealing total cholesterol 135, LDL of 90 and HDL 27.  We will recheck a lipid liver profile

## 2019-03-08 NOTE — Telephone Encounter (Signed)
Received [employer] application from patient.  [employer] application was faxed to [phone] on 02/18/2019 by Abigail Butts May.

## 2019-03-08 NOTE — Assessment & Plan Note (Signed)
History of essential hypertension blood pressure measured today 131/80.  She is on amlodipine, carvedilol.  Continue current meds at current dosing.

## 2019-03-08 NOTE — Progress Notes (Signed)
03/08/2019 Miranda [name]   1955-09-21  [id number]  Primary Physician [name], MD Primary Cardiologist: [name] MD FACP, [place], [place], [place]  HPI:  Miranda [name] is a 63 y.o.  moderately overweight divorced African-American female mother of 3, grandmother of one grandchild who I last saw in the office 10/13/2017.[name] She has a history of treated hypertension, diabetes, hyperlipidemia and tobacco abuse of approximately 20-30 pack years currently smoking 3-5 cigarettes a day. She is disabled because of her back and knees as well as diabetes. She's had a stroke in the past. She was admitted with chest pain and had a minimally elevated troponin. She underwent cardiac catheterization on 11/21/15 by Dr. [place] revealing a 90% proximal AV groove circumflex 60% distal LAD lesion. Her circumflex was stented with a drug-eluting stent. She's had no recurrent symptoms.  Since I saw her a year ago she continues to do well.  She is smoking 5 to 10 cigarettes a day despite being counseled otherwise.  She denies chest pain or shortness of breath.   Current Meds  Medication Sig  . ACCU-CHEK AVIVA PLUS test strip 1 each as needed.  [name] albuterol (PROVENTIL HFA;VENTOLIN HFA) 108 (90 Base) MCG/ACT inhaler Inhale 2 puffs into the lungs every 6 (six) hours as needed for wheezing or shortness of breath.  [name] amLODipine (NORVASC) 10 MG tablet TAKE 1 TABLET BY MOUTH EVERY DAY  . aspirin 81 MG chewable tablet Chew 1 tablet (81 mg total) by mouth daily.  [name] atorvastatin (LIPITOR) 80 MG tablet TAKE 1 TABLET EVERY DAY AT 6 PM  . B-D ULTRAFINE III SHORT PEN 31G X 8 MM MISC 1 each as needed.  [name] buPROPion (WELLBUTRIN SR) 150 MG 12 hr tablet Take 150 mg by mouth daily.  . carvedilol (COREG) 12.5 MG tablet Take 1 tablet (12.5 mg total) by mouth 2 (two) times daily with a meal.  . clopidogrel (PLAVIX) 75 MG tablet TAKE 1 TABLET BY MOUTH EVERY DAY  . Cyanocobalamin (B-12) 1000 MCG CAPS Take 1,000 mcg by mouth  daily.   . diclofenac sodium (VOLTAREN) 1 % GEL Apply 4 g topically 4 (four) times daily.  [name] gabapentin (NEURONTIN) 300 MG capsule Take 300 mg by mouth 3 (three) times daily.   . Glycerin, Adult, 2.1 g SUPP Place 1 suppository rectally daily as needed for moderate constipation.  . insulin glargine (LANTUS) 100 UNIT/ML injection Inject 0.5 mLs (50 Units total) into the skin 2 (two) times daily.  [name] losartan (COZAAR) 25 MG tablet Take 25 mg by mouth daily.  . metFORMIN (GLUMETZA) 1000 MG (MOD) 24 hr tablet Take 1 tablet (1,000 mg total) by mouth 2 (two) times daily with a meal. Restart tomorrow  . MOVANTIK 25 MG TABS tablet Take 25 mg by mouth daily before breakfast.   . nicotine (NICODERM CQ - DOSED IN MG/24 HR) 7 mg/24hr patch Place 1 patch (7 mg total) onto the skin daily.  . ondansetron (ZOFRAN ODT) 4 MG disintegrating tablet Take 1 tablet (4 mg total) by mouth every 6 (six) hours as needed.  [name] oxyCODONE-acetaminophen (PERCOCET) 7.5-325 MG tablet Take 1 tablet by mouth every 4 (four) hours as needed for severe pain.  . polyethylene glycol (MIRALAX / GLYCOLAX) packet Take 17 g by mouth daily as needed.  . traZODone (DESYREL) 50 MG tablet Take 50 mg by mouth at bedtime as needed for sleep.   . Vitamin D, Ergocalciferol, (DRISDOL) 50000 units CAPS capsule Take 50,000 Units  by mouth every 7 (seven) days. monday     No Known Allergies  Social History   Socioeconomic History  . Marital status: Divorced    Spouse name: Not on file  . Number of children: Not on file  . Years of education: Not on file  . Highest education level: Not on file  Occupational History  . Not on file  Social Needs  . Financial resource strain: Not on file  . Food insecurity    Worry: Not on file    Inability: Not on file  . Transportation needs    Medical: Not on file    Non-medical: Not on file  Tobacco Use  . Smoking status: Current Every Day Smoker    Packs/day: 0.25    Years: 45.00    Pack years: 11.25     Types: Cigarettes  . Smokeless tobacco: Never Used  Substance and Sexual Activity  . Alcohol use: No  . Drug use: No  . Sexual activity: Never  Lifestyle  . Physical activity    Days per week: Not on file    Minutes per session: Not on file  . Stress: Not on file  Relationships  . Social Musicianconnections    Talks on phone: Not on file    Gets together: Not on file    Attends religious service: Not on file    Active member of club or organization: Not on file    Attends meetings of clubs or organizations: Not on file    Relationship status: Not on file  . Intimate partner violence    Fear of current or ex partner: Not on file    Emotionally abused: Not on file    Physically abused: Not on file    Forced sexual activity: Not on file  Other Topics Concern  . Not on file  Social History Narrative  . Not on file     Review of Systems: General: negative for chills, fever, night sweats or weight changes.  Cardiovascular: negative for chest pain, dyspnea on exertion, edema, orthopnea, palpitations, paroxysmal nocturnal dyspnea or shortness of breath Dermatological: negative for rash Respiratory: negative for cough or wheezing Urologic: negative for hematuria Abdominal: negative for nausea, vomiting, diarrhea, bright red blood per rectum, melena, or hematemesis Neurologic: negative for visual changes, syncope, or dizziness All other systems reviewed and are otherwise negative except as noted above.    Blood pressure 131/80, pulse 69, temperature 97.8 F (36.6 C), height 5\' 10"  (1.778 m), weight 241 lb 12.8 oz (109.7 kg), SpO2 98 %.  General appearance: alert and no distress Neck: no adenopathy, no carotid bruit, no JVD, supple, symmetrical, trachea midline and thyroid not enlarged, symmetric, no tenderness/mass/nodules Lungs: clear to auscultation bilaterally Heart: regular rate and rhythm, S1, S2 normal, no murmur, click, rub or gallop Extremities: extremities normal,  atraumatic, no cyanosis or edema Pulses: 2+ and symmetric Skin: Skin color, texture, turgor normal. No rashes or lesions Neurologic: Alert and oriented X 3, normal strength and tone. Normal symmetric reflexes. Normal coordination and gait  EKG sinus rhythm at 69 with inferolateral T wave inversion.  I personally reviewed this EKG.  ASSESSMENT AND PLAN:   Essential hypertension History of essential hypertension blood pressure measured today 131/80.  She is on amlodipine, carvedilol.  Continue current meds at current dosing.  Dyslipidemia History of dyslipidemia on high-dose statin therapy with lipid profile performed 01/31/2018 revealing total cholesterol 135, LDL of 90 and HDL 27.  We will recheck a lipid  liver profile  CAD S/P percutaneous coronary angioplasty History of CAD status post circumflex tenting by Dr. Martinique August 2017 with a synergy 3.5 x 12 mm long drug-eluting stent.  Otherwise she had noncritical CAD with a 60% mid to distal LAD stenosis and left dominant system with normal LV function.  She is on aspirin Plavix.  She denies chest pain or shortness of breath.  Tobacco abuse Continue tobacco use of 5 to 10 cigarettes a day recalcitrant risk factor modification.      Lorretta Harp MD FACP,FACC,FAHA, [facility] 03/08/2019 9:18 AM

## 2019-03-08 NOTE — Assessment & Plan Note (Signed)
History of CAD status post circumflex tenting by Dr. Martinique August 2017 with a synergy 3.5 x 12 mm long drug-eluting stent.  Otherwise she had noncritical CAD with a 60% mid to distal LAD stenosis and left dominant system with normal LV function.  She is on aspirin Plavix.  She denies chest pain or shortness of breath.

## 2019-03-08 NOTE — Assessment & Plan Note (Signed)
Continue tobacco use of 5 to 10 cigarettes a day recalcitrant risk factor modification.

## 2019-03-08 NOTE — Patient Instructions (Signed)
Medication Instructions:  Your physician recommends that you continue on your current medications as directed. Please refer to the Current Medication list given to you today.  If you need a refill on your cardiac medications before your next appointment, please call your pharmacy.   Lab work: Lipids and Hepatic Function If you have labs (blood work) drawn today and your tests are completely normal, you will receive your results only by: MyChart Message (if you have MyChart) OR A paper copy in the mail If you have any lab test that is abnormal or we need to change your treatment, we will call you to review the results.  Testing/Procedures: NONE  Follow-Up: At [facility], you and your health needs are our priority.  As part of our continuing mission to provide you with exceptional heart care, we have created designated Provider Care Teams.  These Care Teams include your primary Cardiologist (physician) and Advanced Practice Providers (APPs -  Physician Assistants and Nurse Practitioners) who all work together to provide you with the care you need, when you need it. You may see Dr Berry. or one of the following Advanced Practice Providers on your designated Care Team:    Luke Kilroy, PA-C  Callie Goodrich, PA-C  Jesse Cleaver, FNP Your physician wants you to follow-up in: 1 year. You will receive a reminder letter in the mail two months in advance. If you don't receive a letter, please call our office to schedule the follow-up appointment.     

## 2019-03-09 NOTE — Addendum Note (Signed)
Addended by: Cain Sieve on: 03/09/2019 09:15 AM   Modules accepted: Orders

## 2019-03-14 NOTE — Addendum Note (Signed)
Addended by: Cain Sieve on: 03/14/2019 08:47 AM   Modules accepted: Orders

## 2019-03-16 ENCOUNTER — Ambulatory Visit: Payer: Medicaid Other | Admitting: Orthopaedic Surgery

## 2019-03-21 ENCOUNTER — Ambulatory Visit (INDEPENDENT_AMBULATORY_CARE_PROVIDER_SITE_OTHER): Payer: Medicaid Other | Admitting: Orthopaedic Surgery

## 2019-03-21 ENCOUNTER — Other Ambulatory Visit: Payer: Self-pay

## 2019-03-21 ENCOUNTER — Encounter: Payer: Self-pay | Admitting: Orthopaedic Surgery

## 2019-03-21 DIAGNOSIS — G8929 Other chronic pain: Secondary | ICD-10-CM

## 2019-03-21 DIAGNOSIS — M48061 Spinal stenosis, lumbar region without neurogenic claudication: Secondary | ICD-10-CM

## 2019-03-21 DIAGNOSIS — M5441 Lumbago with sciatica, right side: Secondary | ICD-10-CM | POA: Diagnosis not present

## 2019-03-21 DIAGNOSIS — M5442 Lumbago with sciatica, left side: Secondary | ICD-10-CM | POA: Diagnosis not present

## 2019-03-21 NOTE — Progress Notes (Signed)
The patient is someone of seen before and this is mainly for bilateral knee pain.  She has had multiple injections in her spine in the past.  She said these were epidural injections.  This was done by her pain clinic.  She states that they do not do injections anymore in the back and she needs injections again.  She states that it has been a while since her last injections.  There is an MRI on our system of her lumbar spine that was done in October 2018.  Most of her pain is across the lower lumbar spine.  It hurts with flexion and extension.  She does report pain going down both legs.  On exam she is moderately obese.  She does not have a true positive straight leg raise bilaterally.  Again I have seen her for her knees and she does have some arthritis in both knees.  I injected both knees recently.  Most of her pain seems to be in flexion and extension of the lower lumbar spine.  The MRI shows mainly moderate to severe facet arthropathy at L5-S1.  There is mild foraminal stenosis at L3-L4 and L4-L5 due to a combination of things but it seems that most of the disease is at L5-S1 with the facet joints.  My first recommendation would be continued efforts to lose weight and to work on core strengthening exercises.  I do feel that it is worth at least Dr. Ernestina Patches considering L5-S1 bilateral facet joint injections to see if this helps her symptoms.  If it does that would be great if not I would then order a new MRI of her lumbar spine to get a better assessment as to what else could be causing her pain.  I told her it may be worthwhile for her to at least obtain notes from who had provided injections for her in the past.  We will work on getting her facet joint injection scheduled and I will see her back in about 4 weeks to see how she is done with that.  All question concerns were answered and addressed.

## 2019-03-27 ENCOUNTER — Other Ambulatory Visit: Payer: Self-pay | Admitting: Cardiovascular Disease

## 2019-04-18 ENCOUNTER — Ambulatory Visit: Payer: Self-pay

## 2019-04-18 ENCOUNTER — Ambulatory Visit (INDEPENDENT_AMBULATORY_CARE_PROVIDER_SITE_OTHER): Payer: Medicaid Other | Admitting: Physical Medicine and Rehabilitation

## 2019-04-18 ENCOUNTER — Encounter: Payer: Self-pay | Admitting: Physical Medicine and Rehabilitation

## 2019-04-18 ENCOUNTER — Other Ambulatory Visit: Payer: Self-pay

## 2019-04-18 ENCOUNTER — Ambulatory Visit (INDEPENDENT_AMBULATORY_CARE_PROVIDER_SITE_OTHER): Payer: Medicaid Other | Admitting: Orthopaedic Surgery

## 2019-04-18 ENCOUNTER — Encounter: Payer: Self-pay | Admitting: Orthopaedic Surgery

## 2019-04-18 VITALS — BP 119/73 | HR 64

## 2019-04-18 DIAGNOSIS — M47816 Spondylosis without myelopathy or radiculopathy, lumbar region: Secondary | ICD-10-CM

## 2019-04-18 DIAGNOSIS — M1711 Unilateral primary osteoarthritis, right knee: Secondary | ICD-10-CM | POA: Diagnosis not present

## 2019-04-18 MED ORDER — METHYLPREDNISOLONE ACETATE 40 MG/ML IJ SUSP
40.0000 mg | INTRAMUSCULAR | Status: AC | PRN
  Administered 2019-04-18: 40 mg via INTRA_ARTICULAR

## 2019-04-18 MED ORDER — METHYLPREDNISOLONE ACETATE 80 MG/ML IJ SUSP
40.0000 mg | Freq: Once | INTRAMUSCULAR | Status: AC
  Administered 2019-04-18: 15:00:00 40 mg

## 2019-04-18 MED ORDER — LIDOCAINE HCL 1 % IJ SOLN
3.0000 mL | INTRAMUSCULAR | Status: AC | PRN
  Administered 2019-04-18: 3 mL

## 2019-04-18 NOTE — Progress Notes (Signed)
Miranda [name] - 64 y.o. female MRN [id number]  Date of birth: 24-Aug-1955  Office Visit Note: Visit Date: 04/18/2019 PCP: [name], MD Referred by: [name], MD  Subjective: Chief Complaint  Patient presents with  . Lower Back - Pain, Tingling, Numbness  . Right Leg - Pain, Tingling  . Left Leg - Pain, Numbness, Tingling   HPI: Miranda [name] is a 64 y.o. female who comes in today for planned Bilateral L5-S1 lumbar facet joint/medial branch block with fluoroscopic guidance.  The patient has failed conservative care including home exercise, medications, time and activity modification.  This injection will be diagnostic and hopefully therapeutic.  Please see requesting physician notes for further details and justification.  ROS Otherwise per HPI.  Assessment & Plan: Visit Diagnoses:  1. Spondylosis without myelopathy or radiculopathy, lumbar region     Plan: No additional findings.   Meds & Orders:  Meds ordered this encounter  Medications  . methylPREDNISolone acetate (DEPO-MEDROL) injection 40 mg    Orders Placed This Encounter  Procedures  . Facet Injection  . XR C-ARM NO REPORT    Follow-up: Return for visit to requesting physician as needed.   Procedures: No procedures performed  Lumbar Facet Joint Intra-Articular Injection(s) with Fluoroscopic Guidance  Patient: Miranda [name]      Date of Birth: [birth year] MRN: [id number] PCP: [name], MD      Visit Date: 04/18/2019   Universal Protocol:    Date/Time: 04/18/2019  Consent Given By: the patient  Position: PRONE   Additional Comments: Vital signs were monitored before and after the procedure. Patient was prepped and draped in the usual sterile fashion. The correct patient, procedure, and site was verified.   Injection Procedure Details:  Procedure Site One Meds Administered:  Meds ordered this encounter  Medications  . methylPREDNISolone acetate (DEPO-MEDROL) injection 40 mg       Laterality: Bilateral  Location/Site:  L5-S1  Needle size: 22 guage  Needle type: Spinal  Needle Placement: Articular  Findings:  -Comments: Excellent flow of contrast producing a partial arthrogram.  Procedure Details: The fluoroscope beam is vertically oriented in AP, and the inferior recess is visualized beneath the lower pole of the inferior apophyseal process, which represents the target point for needle insertion. When direct visualization is difficult the target point is located at the medial projection of the vertebral pedicle. The region overlying each aforementioned target is locally anesthetized with a 1 to 2 ml. volume of 1% Lidocaine without Epinephrine.   The spinal needle was inserted into each of the above mentioned facet joints using biplanar fluoroscopic guidance. A 0.25 to 0.5 ml. volume of Isovue-250 was injected and a partial facet joint arthrogram was obtained. A single spot film was obtained of the resulting arthrogram.    One to 1.25 ml of the steroid/anesthetic solution was then injected into each of the facet joints noted above.   Additional Comments:  The patient tolerated the procedure well Dressing: 2 x 2 sterile gauze and Band-Aid    Post-procedure details: Patient was observed during the procedure. Post-procedure instructions were reviewed.  Patient left the clinic in stable condition.     Clinical History: No specialty comments available.   She reports that she has been smoking cigarettes. She has a 11.25 pack-year smoking history. She has never used smokeless tobacco. No results for input(s): HGBA1C, LABURIC in the last 8760 hours.  Objective:  VS:  HT:    WT:   BMI:  BP:119/73  HR:64bpm  TEMP: ( )  RESP:  Physical Exam  Ortho Exam Imaging: No results found.  Past Medical/Family/Surgical/Social History: Medications & Allergies reviewed per EMR, new medications updated. Patient Active Problem List   Diagnosis Date Noted   . Acute right-sided low back pain without sciatica   . Peripheral neuropathy 02/08/2018  . Acute pancreatitis 02/07/2018  . CAD S/P percutaneous coronary angioplasty 01/29/2017  . Tobacco abuse 01/29/2017  . Chronic back pain 01/29/2017  . Hypoglycemia 12/30/2016  . Insulin dependent diabetes mellitus 11/21/2015  . Dyslipidemia 11/21/2015  . NSTEMI (non-ST elevated myocardial infarction) ([place])   . TIA (transient ischemic attack) 05/31/2014  . History of stroke 04/22/2012  . Hypokalemia 04/22/2012  . Diabetes mellitus ([place]) 04/22/2012  . Essential hypertension 04/22/2012   Past Medical History:  Diagnosis Date  . Acid reflux   . Anxiety   . Asthma   . Degenerative disc disease   . Depression   . Diabetes mellitus   . Hypertension   . Hypoglycemia 12/30/2016  . Stroke [facility])    04/22/12   Family History  Problem Relation Age of Onset  . Hypertension Other    Past Surgical History:  Procedure Laterality Date  . CARDIAC CATHETERIZATION N/A 11/21/2015   Procedure: Left Heart Cath and Coronary Angiography;  Surgeon: Peter M Martinique, MD;  Location: [facility];  Service: Cardiovascular;  Laterality: N/A;  . CARDIAC CATHETERIZATION N/A 11/21/2015   Procedure: Coronary Stent Intervention;  Surgeon: Peter M Martinique, MD;  Location: [facility];  Service: Cardiovascular;  Laterality: N/A;  . KNEE SURGERY    . TUBAL LIGATION     Social History   Occupational History  . Not on file  Tobacco Use  . Smoking status: Current Every Day Smoker    Packs/day: 0.25    Years: 45.00    Pack years: 11.25    Types: Cigarettes  . Smokeless tobacco: Never Used  Substance and Sexual Activity  . Alcohol use: No  . Drug use: No  . Sexual activity: Never

## 2019-04-18 NOTE — Progress Notes (Signed)
 .  Numeric Pain Rating Scale and Functional Assessment Average Pain 8   In the last MONTH (on 0-10 scale) has pain interfered with the following?  1. General activity like being  able to carry out your everyday physical activities such as walking, climbing stairs, carrying groceries, or moving a chair?  Rating(8)   +Driver, +BT(plavix, ok for injection), -Dye Allergies.

## 2019-04-18 NOTE — Progress Notes (Signed)
Office Visit Note   Patient: Miranda [name]           Date of Birth: Oct 30, 1955           MRN: [id number] Visit Date: 04/18/2019              Requested by: [name], MD [address] [place],  [place] [address] PCP: [name], MD   Assessment & Plan: Visit Diagnoses:  1. Primary osteoarthritis of right knee     Plan: We will see her back in 2 weeks to see what type of response she had to the injection with Dr. Alvester Morin and also to see her response to the injection in the right knee today.  May consider a cortisone injection left knee at that time.  She did ask about other ironic acid injections.  She states the injection she had in the past with hyaluronic acid work series of 3 and is wondering if she can try these in the future.  We may consider seeing if her insurance will cover Euflexxa see if this would be of benefit shoulder to her however she is not eligible for ironic injections until sometime in April.  Follow-Up Instructions: Return in about 2 weeks (around 05/02/2019).   Orders:  Orders Placed This Encounter  Procedures  . Large Joint Inj   No orders of the defined types were placed in this encounter.     Procedures: Large Joint Inj: R knee on 04/18/2019 10:56 AM Indications: pain Details: 22 G 1.5 in needle, anterolateral approach  Arthrogram: No  Medications: 3 mL lidocaine 1 %; 40 mg methylPREDNISolone acetate 40 MG/ML Outcome: tolerated well, no immediate complications Procedure, treatment alternatives, risks and benefits explained, specific risks discussed. Consent was given by the patient. Immediately prior to procedure a time out was called to verify the correct patient, procedure, equipment, support staff and site/side marked as required. Patient was prepped and draped in the usual sterile fashion.       Clinical Data: No additional findings.   Subjective: Chief Complaint  Patient presents with  . Lower Back - Pain  . Left Knee - Pain   . Right Knee - Pain    HPI Mrs. Miranda [name] comes in today due to bilateral knee pain.  She has a scheduled appointment with Dr. Alvester Morin today for an epidural steroid injection lumbar spine.  She states that the ironic acid injections both knees 02/03/2019 gave her very little relief.  She states both knees are bothering her after the right one is more symptomatic than the left.  She has had no new injury to either knee.  She has known osteoarthritis of both knees. Review of Systems Negative for fevers chills shortness of breath chest pain.  Objective: Vital Signs: There were no vitals taken for this visit.  Physical Exam General: Well-developed well-nourished female no acute distress Psych: Alert and oriented x3 Ortho Exam Bilateral knees good range of motion both knees.  No instability valgus varus stressing.  No abnormal warmth erythema or effusion.  Patellofemoral crepitus with active range of motion of both knees. Specialty Comments:  No specialty comments available.  Imaging: No results found.   PMFS History: Patient Active Problem List   Diagnosis Date Noted  . Acute right-sided low back pain without sciatica   . Peripheral neuropathy 02/08/2018  . Acute pancreatitis 02/07/2018  . CAD S/P percutaneous coronary angioplasty 01/29/2017  . Tobacco abuse 01/29/2017  . Chronic back pain 01/29/2017  . Hypoglycemia 12/30/2016  .  Insulin dependent diabetes mellitus 11/21/2015  . Dyslipidemia 11/21/2015  . NSTEMI (non-ST elevated myocardial infarction) ([place])   . TIA (transient ischemic attack) 05/31/2014  . History of stroke 04/22/2012  . Hypokalemia 04/22/2012  . Diabetes mellitus ([place]) 04/22/2012  . Essential hypertension 04/22/2012   Past Medical History:  Diagnosis Date  . Acid reflux   . Anxiety   . Asthma   . Degenerative disc disease   . Depression   . Diabetes mellitus   . Hypertension   . Hypoglycemia 12/30/2016  . Stroke [facility])    04/22/12    Family History   Problem Relation Age of Onset  . Hypertension Other     Past Surgical History:  Procedure Laterality Date  . CARDIAC CATHETERIZATION N/A 11/21/2015   Procedure: Left Heart Cath and Coronary Angiography;  Surgeon: Peter M Martinique, MD;  Location: [facility];  Service: Cardiovascular;  Laterality: N/A;  . CARDIAC CATHETERIZATION N/A 11/21/2015   Procedure: Coronary Stent Intervention;  Surgeon: Peter M Martinique, MD;  Location: [facility];  Service: Cardiovascular;  Laterality: N/A;  . KNEE SURGERY    . TUBAL LIGATION     Social History   Occupational History  . Not on file  Tobacco Use  . Smoking status: Current Every Day Smoker    Packs/day: 0.25    Years: 45.00    Pack years: 11.25    Types: Cigarettes  . Smokeless tobacco: Never Used  Substance and Sexual Activity  . Alcohol use: No  . Drug use: No  . Sexual activity: Never

## 2019-05-04 ENCOUNTER — Encounter: Payer: Self-pay | Admitting: Orthopaedic Surgery

## 2019-05-04 ENCOUNTER — Telehealth: Payer: Self-pay

## 2019-05-04 ENCOUNTER — Other Ambulatory Visit: Payer: Self-pay

## 2019-05-04 ENCOUNTER — Ambulatory Visit (INDEPENDENT_AMBULATORY_CARE_PROVIDER_SITE_OTHER): Payer: Medicaid Other | Admitting: Orthopaedic Surgery

## 2019-05-04 DIAGNOSIS — M1711 Unilateral primary osteoarthritis, right knee: Secondary | ICD-10-CM | POA: Diagnosis not present

## 2019-05-04 DIAGNOSIS — M1712 Unilateral primary osteoarthritis, left knee: Secondary | ICD-10-CM

## 2019-05-04 DIAGNOSIS — G8929 Other chronic pain: Secondary | ICD-10-CM

## 2019-05-04 DIAGNOSIS — M25562 Pain in left knee: Secondary | ICD-10-CM

## 2019-05-04 DIAGNOSIS — M25561 Pain in right knee: Secondary | ICD-10-CM | POA: Diagnosis not present

## 2019-05-04 MED ORDER — LIDOCAINE HCL 1 % IJ SOLN
3.0000 mL | INTRAMUSCULAR | Status: AC | PRN
  Administered 2019-05-04: 3 mL

## 2019-05-04 MED ORDER — METHYLPREDNISOLONE ACETATE 40 MG/ML IJ SUSP
40.0000 mg | INTRAMUSCULAR | Status: AC | PRN
  Administered 2019-05-04: 15:00:00 40 mg via INTRA_ARTICULAR

## 2019-05-04 NOTE — Progress Notes (Signed)
Office Visit Note   Patient: Miranda [name]           Date of Birth: 1956/02/09           MRN: [id number] Visit Date: 05/04/2019              Requested by: Nolene Ebbs, MD [address] [place],  [place] [address] PCP: Nolene Ebbs, MD   Assessment & Plan: Visit Diagnoses:  1. Unilateral primary osteoarthritis, left knee   2. Unilateral primary osteoarthritis, right knee   3. Chronic pain of left knee   4. Chronic pain of right knee     Plan: I did place a steroid injection in her left knee today that she tolerated well.  We will try to order Euflexxa for both knees to treat the pain from osteoarthritis.  Hopefully we will see her back in 4 weeks to start the series of 3 injections in both knees.  Follow-Up Instructions: Return in about 4 weeks (around 06/01/2019).   Orders:  Orders Placed This Encounter  Procedures  . Large Joint Inj   No orders of the defined types were placed in this encounter.     Procedures: Large Joint Inj: L knee on 05/04/2019 3:03 PM Indications: diagnostic evaluation and pain Details: 22 G 1.5 in needle, superolateral approach  Arthrogram: No  Medications: 3 mL lidocaine 1 %; 40 mg methylPREDNISolone acetate 40 MG/ML Outcome: tolerated well, no immediate complications Procedure, treatment alternatives, risks and benefits explained, specific risks discussed. Consent was given by the patient. Immediately prior to procedure a time out was called to verify the correct patient, procedure, equipment, support staff and site/side marked as required. Patient was prepped and draped in the usual sterile fashion.       Clinical Data: No additional findings.   Subjective: Chief Complaint  Patient presents with  . Lower Back - Follow-up  . Left Knee - Pain  The patient is still dealing with chronic bilateral knee pain.  She has had recent epidural steroid injections by Dr. Ernestina Patches in her spine.  She also has had a recent steroid injection in  her right knee.  She would like to have a steroid injection in her left knee.  She said the steroids were not as effective for her as hyaluronic acid has been in the past.  Before apparently she has had Euflexxa which was a series of 3 injections and she said that helped each time again better than the steroid injections.  She does report some relief in her spine injections but she is in chronic pain management system over this is hard to really tell.  HPI  Review of Systems She currently denies any headache, chest pain, shortness of breath, fever, chills, nausea, vomiting  Objective: Vital Signs: There were no vitals taken for this visit.  Physical Exam She is alert and orient x3 and in no acute distress Ortho Exam Examination of both knees show no effusion but slight varus malalignment and patellofemoral crepitation with global pain on exam. Specialty Comments:  No specialty comments available.  Imaging: No results found.   PMFS History: Patient Active Problem List   Diagnosis Date Noted  . Acute right-sided low back pain without sciatica   . Peripheral neuropathy 02/08/2018  . Acute pancreatitis 02/07/2018  . CAD S/P percutaneous coronary angioplasty 01/29/2017  . Tobacco abuse 01/29/2017  . Chronic back pain 01/29/2017  . Hypoglycemia 12/30/2016  . Insulin dependent diabetes mellitus 11/21/2015  . Dyslipidemia 11/21/2015  .  NSTEMI (non-ST elevated myocardial infarction) ([facility])   . TIA (transient ischemic attack) 05/31/2014  . History of stroke 04/22/2012  . Hypokalemia 04/22/2012  . Diabetes mellitus ([facility]) 04/22/2012  . Essential hypertension 04/22/2012   Past Medical History:  Diagnosis Date  . Acid reflux   . Anxiety   . Asthma   . Degenerative disc disease   . Depression   . Diabetes mellitus   . Hypertension   . Hypoglycemia 12/30/2016  . Stroke [facility])    04/22/12    Family History  Problem Relation Age of Onset  . Hypertension Other     Past Surgical  History:  Procedure Laterality Date  . CARDIAC CATHETERIZATION N/A 11/21/2015   Procedure: Left Heart Cath and Coronary Angiography;  Surgeon: Peter M Swaziland, MD;  [facility]: [facility];  Service: Cardiovascular;  Laterality: N/A;  . CARDIAC CATHETERIZATION N/A 11/21/2015   Procedure: Coronary Stent Intervention;  Surgeon: Peter M Swaziland, MD;  Location: [facility] IN[facility]ASIVE [facility];  Service: Cardiovascular;  Laterality: N/A;  . KNEE SURGERY    . TUBAL LIGATION     Social History   Occupational History  . Not on file  Tobacco Use  . Smoking status: Current Every Day Smoker    Packs/day: 0.25    Years: 45.00    Pack years: 11.25    Types: Cigarettes  . Smokeless tobacco: Never Used  Substance and Sexual Activity  . Alcohol use: No  . Drug use: No  . Sexual activity: Never

## 2019-05-04 NOTE — Telephone Encounter (Signed)
Bilateral Euflexxa

## 2019-05-05 NOTE — Telephone Encounter (Signed)
Not able to receive gel injections until  6 months from 02/03/2019. Patient is able to have gel injections after 08/04/2019.

## 2019-05-05 NOTE — Telephone Encounter (Signed)
Will have to submit for Orthovisc, bilateral knee due to patient having Medicaid, which has to be purchased through  [employer] Patient Assistance.

## 2019-05-06 ENCOUNTER — Telehealth: Payer: Self-pay

## 2019-05-06 NOTE — Telephone Encounter (Signed)
Mailed [employer] application for patient to complete for Orthovisc, bilateral knee gel injection.

## 2019-05-18 ENCOUNTER — Ambulatory Visit: Payer: Medicaid Other | Admitting: Internal Medicine

## 2019-06-01 ENCOUNTER — Ambulatory Visit: Payer: Medicaid Other | Admitting: Orthopaedic Surgery

## 2019-07-06 ENCOUNTER — Ambulatory Visit: Payer: Medicaid Other | Admitting: Internal Medicine

## 2019-07-11 ENCOUNTER — Other Ambulatory Visit: Payer: Self-pay | Admitting: Cardiovascular Disease

## 2019-07-25 ENCOUNTER — Telehealth: Payer: Self-pay | Admitting: Orthopaedic Surgery

## 2019-07-25 NOTE — Telephone Encounter (Signed)
Faxed provider form. Once [employer] approval is received I will inform the pt

## 2019-07-25 NOTE — Telephone Encounter (Signed)
Looks like April to [employer], can you check on this for patient maybe? Thank you!

## 2019-07-25 NOTE — Telephone Encounter (Signed)
Patient called asked if she can get the gel injection in both of her knees? The number to contact patient is [phone]

## 2019-07-25 NOTE — Telephone Encounter (Signed)
Called pt and advised her on the delay in scheduling the injection

## 2019-07-25 NOTE — Telephone Encounter (Signed)
I spoke with [employer] and they asked that I resubmit the provider paperwork.

## 2019-07-25 NOTE — Telephone Encounter (Signed)
I will call [employer] and see what the status is on this pt.

## 2019-08-02 ENCOUNTER — Telehealth: Payer: Self-pay

## 2019-08-02 NOTE — Telephone Encounter (Signed)
Approved for [employer] assistance for Orthovisc Dr. Magnus Ivan    Form faxed for medication delivery

## 2019-08-02 NOTE — Telephone Encounter (Signed)
Lvm for pt to call back to inform 

## 2019-08-03 NOTE — Telephone Encounter (Signed)
Tried to call pt again no answer. If pt calls back, please schedule appointment for two weeks out

## 2019-08-04 ENCOUNTER — Telehealth: Payer: Self-pay | Admitting: Orthopaedic Surgery

## 2019-08-04 NOTE — Telephone Encounter (Signed)
I think this may before you

## 2019-08-04 NOTE — Telephone Encounter (Signed)
Miranda [name] from [facility] called.   She has an issue with the paperwork that was filled out on the patient's behalf.  Call back: [phone]

## 2019-08-04 NOTE — Telephone Encounter (Signed)
called

## 2019-08-15 NOTE — Procedures (Signed)
Lumbar Facet Joint Intra-Articular Injection(s) with Fluoroscopic Guidance  Patient: Miranda [name]      Date of Birth: [birth year] MRN: [id number] PCP: [name], MD      Visit Date: 04/18/2019   Universal Protocol:    Date/Time: 04/18/2019  Consent Given By: the patient  Position: PRONE   Additional Comments: Vital signs were monitored before and after the procedure. Patient was prepped and draped in the usual sterile fashion. The correct patient, procedure, and site was verified.   Injection Procedure Details:  Procedure Site One Meds Administered:  Meds ordered this encounter  Medications  . methylPREDNISolone acetate (DEPO-MEDROL) injection 40 mg     Laterality: Bilateral  Location/Site:  L5-S1  Needle size: 22 guage  Needle type: Spinal  Needle Placement: Articular  Findings:  -Comments: Excellent flow of contrast producing a partial arthrogram.  Procedure Details: The fluoroscope beam is vertically oriented in AP, and the inferior recess is visualized beneath the lower pole of the inferior apophyseal process, which represents the target point for needle insertion. When direct visualization is difficult the target point is located at the medial projection of the vertebral pedicle. The region overlying each aforementioned target is locally anesthetized with a 1 to 2 ml. volume of 1% Lidocaine without Epinephrine.   The spinal needle was inserted into each of the above mentioned facet joints using biplanar fluoroscopic guidance. A 0.25 to 0.5 ml. volume of Isovue-250 was injected and a partial facet joint arthrogram was obtained. A single spot film was obtained of the resulting arthrogram.    One to 1.25 ml of the steroid/anesthetic solution was then injected into each of the facet joints noted above.   Additional Comments:  The patient tolerated the procedure well Dressing: 2 x 2 sterile gauze and Band-Aid    Post-procedure details: Patient was observed  during the procedure. Post-procedure instructions were reviewed.  Patient left the clinic in stable condition.

## 2019-08-21 ENCOUNTER — Emergency Department (HOSPITAL_COMMUNITY)
Admission: EM | Admit: 2019-08-21 | Discharge: 2019-08-21 | Disposition: A | Discharge status: Home / Self Care | Payer: Medicaid Other | Attending: Emergency Medicine | Admitting: Emergency Medicine

## 2019-08-21 ENCOUNTER — Other Ambulatory Visit: Payer: Self-pay

## 2019-08-21 ENCOUNTER — Encounter (HOSPITAL_COMMUNITY): Payer: Self-pay | Admitting: Emergency Medicine

## 2019-08-21 DIAGNOSIS — E119 Type 2 diabetes mellitus without complications: Secondary | ICD-10-CM | POA: Diagnosis not present

## 2019-08-21 DIAGNOSIS — Z7902 Long term (current) use of antithrombotics/antiplatelets: Secondary | ICD-10-CM | POA: Insufficient documentation

## 2019-08-21 DIAGNOSIS — F419 Anxiety disorder, unspecified: Secondary | ICD-10-CM | POA: Diagnosis present

## 2019-08-21 DIAGNOSIS — I10 Essential (primary) hypertension: Secondary | ICD-10-CM | POA: Diagnosis not present

## 2019-08-21 DIAGNOSIS — J45909 Unspecified asthma, uncomplicated: Secondary | ICD-10-CM | POA: Insufficient documentation

## 2019-08-21 DIAGNOSIS — Z79899 Other long term (current) drug therapy: Secondary | ICD-10-CM | POA: Insufficient documentation

## 2019-08-21 DIAGNOSIS — F32A Depression, unspecified: Secondary | ICD-10-CM

## 2019-08-21 DIAGNOSIS — F329 Major depressive disorder, single episode, unspecified: Secondary | ICD-10-CM | POA: Insufficient documentation

## 2019-08-21 DIAGNOSIS — Z7982 Long term (current) use of aspirin: Secondary | ICD-10-CM | POA: Diagnosis not present

## 2019-08-21 DIAGNOSIS — F17210 Nicotine dependence, cigarettes, uncomplicated: Secondary | ICD-10-CM | POA: Insufficient documentation

## 2019-08-21 DIAGNOSIS — Z794 Long term (current) use of insulin: Secondary | ICD-10-CM | POA: Diagnosis not present

## 2019-08-21 LAB — CBG MONITORING, ED: Glucose-Capillary: 179 mg/dL — ABNORMAL HIGH (ref 70–99)

## 2019-08-21 MED ORDER — NICOTINE 21 MG/24HR TD PT24
21.0000 mg | MEDICATED_PATCH | Freq: Once | TRANSDERMAL | Status: DC
  Administered 2019-08-21: 21 mg via TRANSDERMAL
  Filled 2019-08-21: qty 1

## 2019-08-21 MED ORDER — LORAZEPAM 0.5 MG PO TABS: 0.5000 mg | ORAL_TABLET | Freq: Two times a day (BID) | ORAL | 0 refills | Status: DC | PRN

## 2019-08-21 MED ORDER — LORAZEPAM 1 MG PO TABS
1.0000 mg | ORAL_TABLET | Freq: Once | ORAL | Status: AC
  Administered 2019-08-21: 1 mg via ORAL
  Filled 2019-08-21: qty 1

## 2019-08-21 NOTE — ED Notes (Signed)
Pt got out of bed to use the bathroom but didn't make it. Pt awake and alert cleaning up. NAD noted.

## 2019-08-21 NOTE — ED Provider Notes (Signed)
[facility]-EMERGENCY DEPT Provider Note   CSN: [id number] Arrival date & time: 08/21/19  0040     History Chief Complaint  Patient presents with  . Anxiety    Miranda [name] is a 64 y.o. female.  Patient with a history of HTN, DM, CVA, GERD, depression, asthma presents to the ED with complaint of anxiety and nervousness. She is tearful and reports multiple life stressors that are making her feel overwhelmed. Tonight, she went to the store, leaving her 34 year old grandson at home with her significant other and found that he was gone when she returned home, leaving the baby alone in the home. She states this was a "last straw" for her. She denies SI/HI/AVH. She is not on any regular medications for anxiety or depression. She is not involved in counseling.   The history is provided by the patient. No language interpreter was used.  Anxiety       Past Medical History:  Diagnosis Date  . Acid reflux   . Anxiety   . Asthma   . Degenerative disc disease   . Depression   . Diabetes mellitus   . Hypertension   . Hypoglycemia 12/30/2016  . Stroke [facility])    04/22/12    Patient Active Problem List   Diagnosis Date Noted  . Acute right-sided low back pain without sciatica   . Peripheral neuropathy 02/08/2018  . Acute pancreatitis 02/07/2018  . CAD S/P percutaneous coronary angioplasty 01/29/2017  . Tobacco abuse 01/29/2017  . Chronic back pain 01/29/2017  . Hypoglycemia 12/30/2016  . Insulin dependent diabetes mellitus 11/21/2015  . Dyslipidemia 11/21/2015  . NSTEMI (non-ST elevated myocardial infarction) (HCC)   . TIA (transient ischemic attack) 05/31/2014  . History of stroke 04/22/2012  . Hypokalemia 04/22/2012  . Diabetes mellitus (HCC) 04/22/2012  . Essential hypertension 04/22/2012    Past Surgical History:  Procedure Laterality Date  . CARDIAC CATHETERIZATION N/A 11/21/2015   Procedure: Left Heart Cath and Coronary Angiography;  Surgeon: Peter M  [place], MD;  Location: [facility] INVASIVE CV LAB;  Service: Cardiovascular;  Laterality: N/A;  . CARDIAC CATHETERIZATION N/A 11/21/2015   Procedure: Coronary Stent Intervention;  Surgeon: Peter M Swaziland, MD;  Location: [facility] INVASIVE CV LAB;  Service: Cardiovascular;  Laterality: N/A;  . KNEE SURGERY    . TUBAL LIGATION       OB History   No obstetric history on file.     Family History  Problem Relation Age of Onset  . Hypertension Other     Social History   Tobacco Use  . Smoking status: Current Every Day Smoker    Packs/day: 0.25    Years: 45.00    Pack years: 11.25    Types: Cigarettes  . Smokeless tobacco: Never Used  Substance Use Topics  . Alcohol use: No  . Drug use: No    Home Medications Prior to Admission medications   Medication Sig Start Date End Date Taking? Authorizing Provider  ACCU-CHEK AVIVA PLUS test strip 1 each as needed. 10/14/16   [provider]  albuterol (PROVENTIL HFA;VENTOLIN HFA) 108 (90 Base) MCG/ACT inhaler Inhale 2 puffs into the lungs every 6 (six) hours as needed for wheezing or shortness of breath.    [provider]  amLODipine (NORVASC) 10 MG tablet TAKE 1 TABLET BY MOUTH EVERY DAY 03/05/18   [name], MD  aspirin 81 MG chewable tablet Chew 1 tablet (81 mg total) by mouth daily. 02/11/18  Debbe Odea, MD  atorvastatin (LIPITOR) 80 MG tablet TAKE 1 TABLET EVERY DAY AT 6 PM 07/12/19   Lorretta Harp, MD  B-D ULTRAFINE III SHORT PEN 31G X 8 MM MISC 1 each as needed. 11/18/16   [provider]  buPROPion (WELLBUTRIN SR) 150 MG 12 hr tablet Take 150 mg by mouth daily. 12/12/16   [provider]  carvedilol (COREG) 12.5 MG tablet TAKE 1 TABLET (12.5 MG TOTAL) BY MOUTH 2 (TWO) TIMES DAILY WITH A MEAL. 03/28/19   Lorretta Harp, MD  clopidogrel (PLAVIX) 75 MG tablet TAKE 1 TABLET BY MOUTH EVERY DAY 07/12/19   Lorretta Harp, MD  Cyanocobalamin (B-12) 1000 MCG CAPS Take 1,000 mcg by mouth daily.     [provider]  diclofenac sodium (VOLTAREN) 1 % GEL Apply 4 g topically 4 (four) times daily. 02/11/18   Debbe Odea, MD  gabapentin (NEURONTIN) 300 MG capsule Take 300 mg by mouth 3 (three) times daily.     [provider]  Glycerin, Adult, 2.1 g SUPP Place 1 suppository rectally daily as needed for moderate constipation. 02/11/18   Debbe Odea, MD  insulin glargine (LANTUS) 100 UNIT/ML injection Inject 0.5 mLs (50 Units total) into the skin 2 (two) times daily. 02/11/18   Debbe Odea, MD  losartan (COZAAR) 25 MG tablet Take 25 mg by mouth daily.    [provider]  metFORMIN (GLUMETZA) 1000 MG (MOD) 24 hr tablet Take 1 tablet (1,000 mg total) by mouth 2 (two) times daily with a meal. Restart tomorrow 11/23/15   Domenic Polite, MD  MOVANTIK 25 MG TABS tablet Take 25 mg by mouth daily before breakfast.  12/12/16   [provider]  nicotine (NICODERM CQ - DOSED IN MG/24 HR) 7 mg/24hr patch Place 1 patch (7 mg total) onto the skin daily. 02/11/18   Debbe Odea, MD  ondansetron (ZOFRAN ODT) 4 MG disintegrating tablet Take 1 tablet (4 mg total) by mouth every 6 (six) hours as needed. 02/05/18   Ward, Delice Bison, DO  oxyCODONE-acetaminophen (PERCOCET) 7.5-325 MG tablet Take 1 tablet by mouth every 4 (four) hours as needed for severe pain.    [provider]  polyethylene glycol (MIRALAX / GLYCOLAX) packet Take 17 g by mouth daily as needed. 02/11/18   Debbe Odea, MD  traZODone (DESYREL) 50 MG tablet Take 50 mg by mouth at bedtime as needed for sleep.     [provider]  Vitamin D, Ergocalciferol, (DRISDOL) 50000 units CAPS capsule Take 50,000 Units by mouth every 7 (seven) days. monday    [provider]    Allergies    Patient has no known allergies.  Review of Systems   Review of Systems  Constitutional: Negative for chills and fever.  HENT: Negative.   Respiratory: Negative.   Cardiovascular: Negative.   Gastrointestinal:  Negative.   Musculoskeletal: Negative.   Skin: Negative.   Neurological: Negative.   Psychiatric/Behavioral: Positive for dysphoric mood. The patient is nervous/anxious.     Physical Exam Updated Vital Signs BP (!) 182/163 (BP Location: Right Arm) Comment: pt states she hasnt taken any of her HTN meds today  Pulse 84   Temp 98.1 F (36.7 C) (Oral)   Resp 16   SpO2 98%   Physical Exam Constitutional:      Appearance: She is well-developed.  Pulmonary:     Effort: Pulmonary effort is normal.  Musculoskeletal:     Cervical back: Normal range of motion.  Skin:    General: Skin is warm and dry.  Neurological:     Mental Status: She is alert and oriented to person, place, and time.  Psychiatric:        Attention and Perception: Attention and perception normal.        Mood and Affect: Mood is anxious. Affect is tearful.        Speech: Speech is rapid and pressured.        Thought Content: Thought content does not include homicidal or suicidal ideation.        Cognition and Memory: Cognition and memory normal.        Judgment: Judgment normal.     Comments: Alert, oriented patient who is tearful.      ED Results / Procedures / Treatments   Labs (all labs ordered are listed, but only abnormal results are displayed) Labs Reviewed - No data to display  EKG None  Radiology No results found.  Procedures Procedures (including critical care time)  Medications Ordered in ED Medications  nicotine (NICODERM CQ - dosed in mg/24 hours) patch 21 mg (21 mg Transdermal Patch Applied 08/21/19 0147)  LORazepam (ATIVAN) tablet 1 mg (1 mg Oral Given 08/21/19 0147)    ED Course  I have reviewed the triage vital signs and the nursing notes.  Pertinent labs & imaging results that were available during my care of the patient were reviewed by me and considered in my medical decision making (see chart for details).    MDM Rules/Calculators/A&P                      Patient to ED with  emotional distress, anxious, feeling significant overwhelm. No SI/HI/AVH. No substance abuse issues.   Discussed options for evaluation and treatment, including TTS evaluation vs outpatient options for follow up and to establish counseling. She does not feel she needs hospitalization. I do not feel she is a danger to herself or others. I offered to let her rest over night, provide Ativan to help her rest, and discharge in the morning with outpatient resources. The patient reports she is comfortable and appreciative of this plan.   H/O DM. CBG in the department is 179. VSS.   6:15 - on recheck of the patient I find her awake, ambulatory, oriented, calm. She reports she couldn't make it the bathroom and has urinated on the floor.   As previously planned, the patient can be discharged home with outpatient follow up.   Final Clinical Impression(s) / ED Diagnoses Final diagnoses:  None   1. Depression   Rx / DC Orders ED Discharge Orders    None       Elpidio Anis, Cordelia Poche 08/21/19 6314    Paula Libra, MD 08/21/19 [phone]

## 2019-08-21 NOTE — ED Triage Notes (Signed)
Patient states she is having a lot of anxiety due to interpersonal issues with friends and family. Patient states that "it all hit the fan today" but does not really elaborate. Patient with possible hallucinations. Patient states she has "not been able to function" today.

## 2019-08-21 NOTE — ED Notes (Signed)
Pt lying in bed talking on the phone. NAD noted. Will continue to monitor.

## 2019-08-21 NOTE — ED Notes (Signed)
Pt sitting up in bed. NAD noted. Pt ready for d/c 

## 2019-08-21 NOTE — ED Notes (Signed)
Pt lying in bed asking for something to eat. Will check BS and re-adress.

## 2019-08-21 NOTE — Discharge Instructions (Signed)
As discussed tonight, you are being given resources for outpatient counseling to help manage your symptoms of depression. You have been given a prescription for a limited number of Ativan. Your doctor will need to continue this medication if he feels it is indicated.

## 2019-08-22 ENCOUNTER — Encounter: Payer: Self-pay | Admitting: Orthopaedic Surgery

## 2019-08-22 ENCOUNTER — Ambulatory Visit (INDEPENDENT_AMBULATORY_CARE_PROVIDER_SITE_OTHER): Payer: Medicaid Other | Admitting: Orthopaedic Surgery

## 2019-08-22 ENCOUNTER — Other Ambulatory Visit: Payer: Self-pay

## 2019-08-22 DIAGNOSIS — M1711 Unilateral primary osteoarthritis, right knee: Secondary | ICD-10-CM

## 2019-08-22 DIAGNOSIS — M1712 Unilateral primary osteoarthritis, left knee: Secondary | ICD-10-CM

## 2019-08-22 NOTE — Progress Notes (Signed)
   Procedure Note  Patient: Miranda [name]             Date of Birth: May 09, 1955           MRN: [id number]             Visit Date: 08/22/2019  Procedures: Visit Diagnoses:  1. Unilateral primary osteoarthritis, left knee   2. Unilateral primary osteoarthritis, right knee    The patient is here today for bilateral hyaluronic acid injections with Orthovisc today to treat the pain from osteoarthritis.  We had accompanied urinary disease.  She is on Plavix due to significant TIAs in the past.  She is a diabetic but reports better blood glucose control.  She knows we are trying these injections to hopefully decrease her pain from the osteoarthritis.  She is also in chronic pain management.  She mobilizes slow.  She has had no acute change in her medical status.  She is trying to avoid surgery at all cost.  On examination of her knees both of her knees are thin.  There is no significant effusion of either knee but painful arc of motion with slight varus malalignment.  I did place Orthovisc in both knees without difficulty.  All questions and concerns were asked and addressed.  We can see her back in 3 months to see how this is helped her not.

## 2019-08-24 ENCOUNTER — Telehealth: Payer: Self-pay

## 2019-08-24 NOTE — Telephone Encounter (Signed)
Placed on Ashley's desk. Spoke with her to confirm with Dr. Magnus Ivan when patient needs to come in between injections.

## 2019-08-24 NOTE — Telephone Encounter (Signed)
FYI   J&J medication was received today. Pt has already received 1st dose. Please call pt to schedule next two appointments. Gel injections were placed on your desk. Thank you

## 2019-08-30 ENCOUNTER — Encounter: Payer: Self-pay | Admitting: Orthopaedic Surgery

## 2019-08-30 ENCOUNTER — Other Ambulatory Visit: Payer: Self-pay

## 2019-08-30 ENCOUNTER — Ambulatory Visit (INDEPENDENT_AMBULATORY_CARE_PROVIDER_SITE_OTHER): Payer: Medicaid Other | Admitting: Orthopaedic Surgery

## 2019-08-30 DIAGNOSIS — M1712 Unilateral primary osteoarthritis, left knee: Secondary | ICD-10-CM | POA: Diagnosis not present

## 2019-08-30 DIAGNOSIS — M1711 Unilateral primary osteoarthritis, right knee: Secondary | ICD-10-CM | POA: Diagnosis not present

## 2019-08-30 MED ORDER — HYALURONAN 30 MG/2ML IX SOSY
30.0000 mg | PREFILLED_SYRINGE | INTRA_ARTICULAR | Status: AC | PRN
  Administered 2019-08-30: 30 mg via INTRA_ARTICULAR

## 2019-08-30 NOTE — Progress Notes (Signed)
   Procedure Note  Patient: Miranda [name]             Date of Birth: 1956/03/01           MRN: [id number]             Visit Date: 08/30/2019 HPI: Miranda [name] returns today for her second round of bilateral Orthovisc injections.  She had no adverse events to the first injections.  Physical exam: Bilateral knees good range of motion.  No abnormal warmth erythema or effusion  Procedures: Visit Diagnoses:  1. Unilateral primary osteoarthritis, left knee   2. Unilateral primary osteoarthritis, right knee     Large Joint Inj: bilateral knee on 08/30/2019 9:21 AM Indications: pain Details: 22 G 1.5 in needle, anterolateral approach  Arthrogram: No  Medications (Right): 30 mg Hyaluronan 30 MG/2ML Medications (Left): 30 mg Hyaluronan 30 MG/2ML Outcome: tolerated well, no immediate complications Procedure, treatment alternatives, risks and benefits explained, specific risks discussed. Consent was given by the patient. Immediately prior to procedure a time out was called to verify the correct patient, procedure, equipment, support staff and site/side marked as required. Patient was prepped and draped in the usual sterile fashion.      Plan: She will follow-up with [place] in 1 week for her third set of Orthovisc injections bilateral knees.  Questions encouraged and answered.

## 2019-09-05 ENCOUNTER — Emergency Department (HOSPITAL_COMMUNITY)
Admission: EM | Admit: 2019-09-05 | Discharge: 2019-09-05 | Disposition: A | Discharge status: Home / Self Care | Payer: Medicaid Other | Attending: Emergency Medicine | Admitting: Emergency Medicine

## 2019-09-05 ENCOUNTER — Encounter (HOSPITAL_COMMUNITY): Payer: Self-pay | Admitting: *Deleted

## 2019-09-05 ENCOUNTER — Other Ambulatory Visit: Payer: Self-pay

## 2019-09-05 ENCOUNTER — Emergency Department (HOSPITAL_COMMUNITY): Payer: Medicaid Other

## 2019-09-05 DIAGNOSIS — K5900 Constipation, unspecified: Secondary | ICD-10-CM | POA: Diagnosis not present

## 2019-09-05 DIAGNOSIS — E119 Type 2 diabetes mellitus without complications: Secondary | ICD-10-CM | POA: Insufficient documentation

## 2019-09-05 DIAGNOSIS — Z79899 Other long term (current) drug therapy: Secondary | ICD-10-CM | POA: Diagnosis not present

## 2019-09-05 DIAGNOSIS — I10 Essential (primary) hypertension: Secondary | ICD-10-CM | POA: Insufficient documentation

## 2019-09-05 DIAGNOSIS — R2243 Localized swelling, mass and lump, lower limb, bilateral: Secondary | ICD-10-CM | POA: Diagnosis present

## 2019-09-05 DIAGNOSIS — F17210 Nicotine dependence, cigarettes, uncomplicated: Secondary | ICD-10-CM | POA: Diagnosis not present

## 2019-09-05 DIAGNOSIS — R600 Localized edema: Secondary | ICD-10-CM

## 2019-09-05 DIAGNOSIS — Z794 Long term (current) use of insulin: Secondary | ICD-10-CM | POA: Insufficient documentation

## 2019-09-05 DIAGNOSIS — E876 Hypokalemia: Secondary | ICD-10-CM | POA: Insufficient documentation

## 2019-09-05 LAB — CBC WITH DIFFERENTIAL/PLATELET
Abs Immature Granulocytes: 0.01 10*3/uL (ref 0.00–0.07)
Basophils Absolute: 0.1 10*3/uL (ref 0.0–0.1)
Basophils Relative: 1 %
Eosinophils Absolute: 0.1 10*3/uL (ref 0.0–0.5)
Eosinophils Relative: 2 %
HCT: 40.3 % (ref 36.0–46.0)
Hemoglobin: 12 g/dL (ref 12.0–15.0)
Immature Granulocytes: 0 %
Lymphocytes Relative: 32 %
Lymphs Abs: 2.2 10*3/uL (ref 0.7–4.0)
MCH: 27.7 pg (ref 26.0–34.0)
MCHC: 29.8 g/dL — ABNORMAL LOW (ref 30.0–36.0)
MCV: 93.1 fL (ref 80.0–100.0)
Monocytes Absolute: 0.7 10*3/uL (ref 0.1–1.0)
Monocytes Relative: 10 %
Neutro Abs: 3.8 10*3/uL (ref 1.7–7.7)
Neutrophils Relative %: 55 %
Platelets: 255 10*3/uL (ref 150–400)
RBC: 4.33 MIL/uL (ref 3.87–5.11)
RDW: 14.6 % (ref 11.5–15.5)
WBC: 6.9 10*3/uL (ref 4.0–10.5)
nRBC: 0 % (ref 0.0–0.2)

## 2019-09-05 LAB — BASIC METABOLIC PANEL
Anion gap: 8 (ref 5–15)
BUN: 9 mg/dL (ref 8–23)
CO2: 29 mmol/L (ref 22–32)
Calcium: 9.1 mg/dL (ref 8.9–10.3)
Chloride: 106 mmol/L (ref 98–111)
Creatinine, Ser: 0.7 mg/dL (ref 0.44–1.00)
GFR calc Af Amer: >60 mL/min (ref >60)
GFR calc non Af Amer: >60 mL/min (ref >60)
Glucose, Bld: 260 mg/dL — ABNORMAL HIGH (ref 70–99)
Potassium: 3.4 mmol/L — ABNORMAL LOW (ref 3.5–5.1)
Sodium: 143 mmol/L (ref 135–145)

## 2019-09-05 MED ORDER — FUROSEMIDE 40 MG PO TABS
20.0000 mg | ORAL_TABLET | Freq: Once | ORAL | Status: AC
  Administered 2019-09-05: 20 mg via ORAL
  Filled 2019-09-05: qty 1

## 2019-09-05 MED ORDER — POTASSIUM CHLORIDE CRYS ER 20 MEQ PO TBCR
40.0000 meq | EXTENDED_RELEASE_TABLET | Freq: Once | ORAL | Status: AC
  Administered 2019-09-05: 40 meq via ORAL
  Filled 2019-09-05: qty 2

## 2019-09-05 MED ORDER — OXYCODONE-ACETAMINOPHEN 5-325 MG PO TABS
1.0000 | ORAL_TABLET | Freq: Once | ORAL | Status: AC
  Administered 2019-09-05: 1 via ORAL
  Filled 2019-09-05: qty 1

## 2019-09-05 MED ORDER — POTASSIUM CHLORIDE ER 10 MEQ PO TBCR: 10.0000 meq | EXTENDED_RELEASE_TABLET | Freq: Every day | ORAL | 0 refills | Status: DC

## 2019-09-05 MED ORDER — FUROSEMIDE 20 MG PO TABS
20.0000 mg | ORAL_TABLET | Freq: Every day | ORAL | 0 refills | Status: AC
Start: 2019-09-05 — End: ?

## 2019-09-05 NOTE — ED Notes (Signed)
Pt placed on purewick 

## 2019-09-05 NOTE — ED Notes (Signed)
Pt on bedside commode.

## 2019-09-05 NOTE — ED Notes (Signed)
Dr. Freida Busman at bedside to eval .

## 2019-09-05 NOTE — ED Notes (Signed)
SSE given a/o. Pt tolerated without distress.

## 2019-09-05 NOTE — ED Provider Notes (Signed)
[facility] DEPT Provider Note   CSN: [id number] Arrival date & time: 09/05/19  0557     History Chief Complaint  Patient presents with  . Constipation    Miranda [name] is a 64 y.o. female.  64 year old female presents with several weeks of bilateral lower extremity pitting edema.  States that this is caused severe discomfort characterizes dull and worse with standing.  No back discomfort.  Denies any dyspnea on exertion.  No cough or congestion.  Also has a secondary complaint of constipation times several days unrelieved with suppositories.  Denies any fever or emesis.  Is currently taking medication for constipation without relief.        Past Medical History:  Diagnosis Date  . Acid reflux   . Anxiety   . Asthma   . Degenerative disc disease   . Depression   . Diabetes mellitus   . Hypertension   . Hypoglycemia 12/30/2016  . Stroke [facility])    04/22/12    Patient Active Problem List   Diagnosis Date Noted  . Acute right-sided low back pain without sciatica   . Peripheral neuropathy 02/08/2018  . Acute pancreatitis 02/07/2018  . CAD S/P percutaneous coronary angioplasty 01/29/2017  . Tobacco abuse 01/29/2017  . Chronic back pain 01/29/2017  . Hypoglycemia 12/30/2016  . Insulin dependent diabetes mellitus 11/21/2015  . Dyslipidemia 11/21/2015  . NSTEMI (non-ST elevated myocardial infarction) ([place])   . TIA (transient ischemic attack) 05/31/2014  . History of stroke 04/22/2012  . Hypokalemia 04/22/2012  . Diabetes mellitus ([place]) 04/22/2012  . Essential hypertension 04/22/2012    Past Surgical History:  Procedure Laterality Date  . CARDIAC CATHETERIZATION N/A 11/21/2015   Procedure: Left Heart Cath and Coronary Angiography;  Surgeon: Peter M Martinique, MD;  Location: [place] CV LAB;  Service: Cardiovascular;  Laterality: N/A;  . CARDIAC CATHETERIZATION N/A 11/21/2015   Procedure: Coronary Stent Intervention;  Surgeon: Peter M  Martinique, MD;  Location: [place] CV LAB;  Service: Cardiovascular;  Laterality: N/A;  . KNEE SURGERY    . TUBAL LIGATION       OB History   No obstetric history on file.     Family History  Problem Relation Age of Onset  . Hypertension Other     Social History   Tobacco Use  . Smoking status: Current Every Day Smoker    Packs/day: 0.25    Years: 45.00    Pack years: 11.25    Types: Cigarettes  . Smokeless tobacco: Never Used  Substance Use Topics  . Alcohol use: No  . Drug use: No    Home Medications Prior to Admission medications   Medication Sig Start Date End Date Taking? Authorizing Provider  ACCU-CHEK AVIVA PLUS test strip 1 each as needed. 10/14/16   [provider]  albuterol (PROVENTIL HFA;VENTOLIN HFA) 108 (90 Base) MCG/ACT inhaler Inhale 2 puffs into the lungs every 6 (six) hours as needed for wheezing or shortness of breath.    [provider]  amLODipine (NORVASC) 10 MG tablet TAKE 1 TABLET BY MOUTH EVERY DAY 03/05/18   Lorretta Harp, MD  aspirin 81 MG chewable tablet Chew 1 tablet (81 mg total) by mouth daily. 02/11/18   Debbe Odea, MD  atorvastatin (LIPITOR) 80 MG tablet TAKE 1 TABLET EVERY DAY AT 6 PM 07/12/19   Lorretta Harp, MD  B-D ULTRAFINE III SHORT PEN 31G X 8 MM MISC 1 each as needed. 11/18/16   [provider]  buPROPion (WELLBUTRIN SR) 150 MG 12 hr tablet Take 150 mg by mouth daily. 12/12/16   [provider]  carvedilol (COREG) 12.5 MG tablet TAKE 1 TABLET (12.5 MG TOTAL) BY MOUTH 2 (TWO) TIMES DAILY WITH A MEAL. 03/28/19   [name], MD  clopidogrel (PLAVIX) 75 MG tablet TAKE 1 TABLET BY MOUTH EVERY DAY 07/12/19   [name], MD  Cyanocobalamin (B-12) 1000 MCG CAPS Take 1,000 mcg by mouth daily.     [provider]  diclofenac sodium (VOLTAREN) 1 % GEL Apply 4 g topically 4 (four) times daily. 02/11/18   Calvert Cantor, MD  gabapentin (NEURONTIN) 300 MG capsule Take 300 mg by mouth 3  (three) times daily.     [provider]  Glycerin, Adult, 2.1 g SUPP Place 1 suppository rectally daily as needed for moderate constipation. 02/11/18   Calvert Cantor, MD  insulin glargine (LANTUS) 100 UNIT/ML injection Inject 0.5 mLs (50 Units total) into the skin 2 (two) times daily. 02/11/18   Calvert Cantor, MD  LORazepam (ATIVAN) 0.5 MG tablet Take 1 tablet (0.5 mg total) by mouth 2 (two) times daily as needed for anxiety. 08/21/19   Elpidio Anis, PA-C  losartan (COZAAR) 25 MG tablet Take 25 mg by mouth daily.    [provider]  metFORMIN (GLUMETZA) 1000 MG (MOD) 24 hr tablet Take 1 tablet (1,000 mg total) by mouth 2 (two) times daily with a meal. Restart tomorrow 11/23/15   Zannie Cove, MD  MOVANTIK 25 MG TABS tablet Take 25 mg by mouth daily before breakfast.  12/12/16   [provider]  nicotine (NICODERM CQ - DOSED IN MG/24 HR) 7 mg/24hr patch Place 1 patch (7 mg total) onto the skin daily. 02/11/18   Calvert Cantor, MD  ondansetron (ZOFRAN ODT) 4 MG disintegrating tablet Take 1 tablet (4 mg total) by mouth every 6 (six) hours as needed. 02/05/18   Ward, Layla Maw, DO  oxyCODONE-acetaminophen (PERCOCET) 7.5-325 MG tablet Take 1 tablet by mouth every 4 (four) hours as needed for severe pain.    [provider]  polyethylene glycol (MIRALAX / GLYCOLAX) packet Take 17 g by mouth daily as needed. 02/11/18   Calvert Cantor, MD  traZODone (DESYREL) 50 MG tablet Take 50 mg by mouth at bedtime as needed for sleep.     [provider]  Vitamin D, Ergocalciferol, (DRISDOL) 50000 units CAPS capsule Take 50,000 Units by mouth every 7 (seven) days. monday    [provider]    Allergies    Patient has no known allergies.  Review of Systems   Review of Systems  All other systems reviewed and are negative.   Physical Exam Updated Vital Signs BP (!) 157/82   Pulse 68   Temp 98.1 F (36.7 C) (Oral)   Resp 18   SpO2 97%   Physical  Exam Vitals and nursing note reviewed.  Constitutional:      General: She is not in acute distress.    Appearance: Normal appearance. She is well-developed. She is not toxic-appearing.  HENT:     Head: Normocephalic and atraumatic.  Eyes:     General: Lids are normal.     Conjunctiva/sclera: Conjunctivae normal.     Pupils: Pupils are equal, round, and reactive to light.  Neck:     Thyroid: No thyroid mass.     Trachea: No tracheal deviation.  Cardiovascular:     Rate and Rhythm: Normal rate and regular  rhythm.     Heart sounds: Normal heart sounds. No murmur. No gallop.   Pulmonary:     Effort: Pulmonary effort is normal. No respiratory distress.     Breath sounds: Normal breath sounds. No stridor. No decreased breath sounds, wheezing, rhonchi or rales.  Abdominal:     General: Bowel sounds are decreased. There is no distension.     Palpations: Abdomen is soft.     Tenderness: There is no abdominal tenderness. There is no guarding or rebound.  Musculoskeletal:        General: No tenderness. Normal range of motion.     Cervical back: Normal range of motion and neck supple.     Comments: Neurovascular intact at both feet  Lymphadenopathy:     Comments: 2+ bilateral lower extremity pitting edema  Skin:    General: Skin is warm and dry.     Findings: No abrasion or rash.  Neurological:     Mental Status: She is alert and oriented to person, place, and time.     GCS: GCS eye subscore is 4. GCS verbal subscore is 5. GCS motor subscore is 6.     Cranial Nerves: No cranial nerve deficit.     Sensory: No sensory deficit.  Psychiatric:        Speech: Speech normal.        Behavior: Behavior normal.     ED Results / Procedures / Treatments   Labs (all labs ordered are listed, but only abnormal results are displayed) Labs Reviewed  CBC WITH DIFFERENTIAL/PLATELET  BASIC METABOLIC PANEL    EKG None  Radiology No results found.  Procedures Procedures (including critical  care time)  Medications Ordered in ED Medications  oxyCODONE-acetaminophen (PERCOCET/ROXICET) 5-325 MG per tablet 1 tablet (has no administration in time range)    ED Course  I have reviewed the triage vital signs and the nursing notes.  Pertinent labs & imaging results that were available during my care of the patient were reviewed by me and considered in my medical decision making (see chart for details).    MDM Rules/Calculators/A&P                      Given dose of Lasix here for lower extremity edema as well as oral dose of potassium due to her K being 3.4.  Patient had films which were consistent with constipation and given enema by nursing and feels much better.  Otherwise labs are reassuring with exception of mild hyperglycemia to 60.  Will discharge home with 2 days of Lasix and K-Dur and she will see her doctor Final Clinical Impression(s) / ED Diagnoses Final diagnoses:  None    Rx / DC Orders ED Discharge Orders    None       Lorre Nick, MD 09/05/19 1257

## 2019-09-05 NOTE — ED Notes (Signed)
Pt had a large, formed, brown BM. Sts "I feel like a new woman" Dr. Freida Busman made aware.

## 2019-09-05 NOTE — ED Triage Notes (Signed)
Pt reporting bilateral feet swelling x 2 weeks, constipation  (LBM yesterday, has been using suppositories for the same), pt says she also feels anxious.

## 2019-09-06 ENCOUNTER — Ambulatory Visit (INDEPENDENT_AMBULATORY_CARE_PROVIDER_SITE_OTHER): Payer: Medicaid Other | Admitting: Orthopaedic Surgery

## 2019-09-06 ENCOUNTER — Encounter: Payer: Self-pay | Admitting: Orthopaedic Surgery

## 2019-09-06 ENCOUNTER — Ambulatory Visit: Payer: Medicaid Other | Admitting: Orthopaedic Surgery

## 2019-09-06 DIAGNOSIS — M1711 Unilateral primary osteoarthritis, right knee: Secondary | ICD-10-CM

## 2019-09-06 DIAGNOSIS — M1712 Unilateral primary osteoarthritis, left knee: Secondary | ICD-10-CM

## 2019-09-06 MED ORDER — HYALURONAN 30 MG/2ML IX SOSY
30.0000 mg | PREFILLED_SYRINGE | INTRA_ARTICULAR | Status: AC | PRN
  Administered 2019-09-06: 30 mg via INTRA_ARTICULAR

## 2019-09-06 NOTE — Progress Notes (Signed)
   Procedure Note  Patient: Miranda [name]             Date of Birth: 1955-05-09           MRN: [id number]             Visit Date: 09/06/2019  Procedures: Visit Diagnoses:  1. Unilateral primary osteoarthritis, left knee   2. Unilateral primary osteoarthritis, right knee     Large Joint Inj: R knee on 09/06/2019 10:55 AM Indications: diagnostic evaluation and pain Details: 22 G 1.5 in needle, superolateral approach  Arthrogram: No  Medications: 30 mg Hyaluronan 30 MG/2ML Outcome: tolerated well, no immediate complications Procedure, treatment alternatives, risks and benefits explained, specific risks discussed. Consent was given by the patient. Immediately prior to procedure a time out was called to verify the correct patient, procedure, equipment, support staff and site/side marked as required. Patient was prepped and draped in the usual sterile fashion.   Large Joint Inj: L knee on 09/06/2019 10:55 AM Indications: diagnostic evaluation and pain Details: 22 G 1.5 in needle, superolateral approach  Arthrogram: No  Medications: 30 mg Hyaluronan 30 MG/2ML Outcome: tolerated well, no immediate complications Procedure, treatment alternatives, risks and benefits explained, specific risks discussed. Consent was given by the patient. Immediately prior to procedure a time out was called to verify the correct patient, procedure, equipment, support staff and site/side marked as required. Patient was prepped and draped in the usual sterile fashion.    The patient is here today for injection #3 of Orthovisc in both her knees to treat the pain from osteoarthritis.  She is had well-documented osteoarthritis of both knees and the failure of conservative treatment including steroid injections.  Her knee pain is daily and is detrimentally affecting her mobility.  She has had no adverse reaction to the other 2 injections of Orthovisc in either knee.  On exam neither knee has an effusion.  Both knees  have slight varus malalignment with some patellofemoral crepitation and medial joint line tenderness.  I did place Orthovisc No. 3 in each knee without difficulty.  All questions and concerns were answered and addressed.  Follow-up is as needed.  She knows that she needs to wait at least 6 months minimum to having these injections again.

## 2019-11-16 ENCOUNTER — Ambulatory Visit: Payer: Medicaid Other | Admitting: Physician Assistant

## 2019-11-21 ENCOUNTER — Ambulatory Visit: Payer: Medicaid Other | Admitting: Orthopaedic Surgery

## 2019-11-21 ENCOUNTER — Ambulatory Visit (INDEPENDENT_AMBULATORY_CARE_PROVIDER_SITE_OTHER): Payer: Medicaid Other | Admitting: Orthopaedic Surgery

## 2019-11-21 ENCOUNTER — Encounter: Payer: Self-pay | Admitting: Orthopaedic Surgery

## 2019-11-21 DIAGNOSIS — M25562 Pain in left knee: Secondary | ICD-10-CM

## 2019-11-21 DIAGNOSIS — G8929 Other chronic pain: Secondary | ICD-10-CM | POA: Diagnosis not present

## 2019-11-21 DIAGNOSIS — M25561 Pain in right knee: Secondary | ICD-10-CM | POA: Diagnosis not present

## 2019-11-21 MED ORDER — LIDOCAINE HCL 1 % IJ SOLN
3.0000 mL | INTRAMUSCULAR | Status: AC | PRN
Start: 2019-11-21 — End: 2019-11-21
  Administered 2019-11-21: 3 mL

## 2019-11-21 MED ORDER — METHYLPREDNISOLONE ACETATE 40 MG/ML IJ SUSP
40.0000 mg | INTRAMUSCULAR | Status: AC | PRN
  Administered 2019-11-21: 40 mg via INTRA_ARTICULAR

## 2019-11-21 MED ORDER — LIDOCAINE HCL 1 % IJ SOLN
3.0000 mL | INTRAMUSCULAR | Status: AC | PRN
  Administered 2019-11-21: 3 mL

## 2019-11-21 NOTE — Progress Notes (Signed)
Office Visit Note   Patient: Miranda [name]           Date of Birth: 06/21/1955           MRN: [id number] Visit Date: 11/21/2019              Requested by: [name], MD [address] [place],  [place] [address] PCP: [name], MD   Assessment & Plan: Visit Diagnoses:  1. Chronic pain of left knee   2. Chronic pain of right knee     Plan: She fully understands the risk and benefits of steroid injections and I did place an injection in each knee today.  She will watch her blood glucose closely.  She knows that she is to wait at least 3 to 4 months between injections.  We did talk about the possibility of knee replacement surgery in the future.  She also needs to work on his best blood glucose control she can.  All questions and concerns were answered addressed.  Follow-up will be as needed.  Follow-Up Instructions: Return if symptoms worsen or fail to improve.   Orders:  Orders Placed This Encounter  Procedures  . Large Joint Inj  . Large Joint Inj   No orders of the defined types were placed in this encounter.     Procedures: Large Joint Inj: R knee on 11/21/2019 1:29 PM Indications: diagnostic evaluation and pain Details: 22 G 1.5 in needle, superolateral approach  Arthrogram: No  Medications: 3 mL lidocaine 1 %; 40 mg methylPREDNISolone acetate 40 MG/ML Outcome: tolerated well, no immediate complications Procedure, treatment alternatives, risks and benefits explained, specific risks discussed. Consent was given by the patient. Immediately prior to procedure a time out was called to verify the correct patient, procedure, equipment, support staff and site/side marked as required. Patient was prepped and draped in the usual sterile fashion.   Large Joint Inj on 11/21/2019 1:29 PM Indications: diagnostic evaluation and pain Details: 22 G 1.5 in needle, superolateral approach  Arthrogram: No  Medications: 3 mL lidocaine 1 %; 40 mg methylPREDNISolone acetate 40  MG/ML Outcome: tolerated well, no immediate complications Procedure, treatment alternatives, risks and benefits explained, specific risks discussed. Consent was given by the patient. Immediately prior to procedure a time out was called to verify the correct patient, procedure, equipment, support staff and site/side marked as required. Patient was prepped and draped in the usual sterile fashion.       Clinical Data: No additional findings.   Subjective: Chief Complaint  Patient presents with  . Left Knee - Pain, Follow-up  . Right Knee - Pain, Follow-up  The patient is well-known to me.  She has bilateral knee osteoarthritis and chronic pain in both knees with the right worse than left.  She last had hyaluronic acid injections in May in both knees.  She does not feel they helped her at all.  She is requesting steroid injections today in both knees.  She is a diabetic and reports her last hemoglobin A1c in the 7 range.  She understands that steroid injections can elevate blood glucose levels.  She has had no other acute change in medical status.  She has tried and failed other conservative treatment measures.  She is even loss of least 30 pounds.  HPI  Review of Systems She currently denies any headache, chest pain, shortness of breath, fever, chills, nausea, vomiting  Objective: Vital Signs: There were no vitals taken for this visit.  Physical Exam She  is alert and orient x3 and in no acute distress Ortho Exam Examination of both knees shows slight varus malalignment no effusion.  Both knees have good range of motion and are ligamentously stable but are globally tender. Specialty Comments:  No specialty comments available.  Imaging: No results found.   PMFS History: Patient Active Problem List   Diagnosis Date Noted  . Acute right-sided low back pain without sciatica   . Peripheral neuropathy 02/08/2018  . Acute pancreatitis 02/07/2018  . CAD S/P percutaneous coronary  angioplasty 01/29/2017  . Tobacco abuse 01/29/2017  . Chronic back pain 01/29/2017  . Hypoglycemia 12/30/2016  . Insulin dependent diabetes mellitus 11/21/2015  . Dyslipidemia 11/21/2015  . NSTEMI (non-ST elevated myocardial infarction) (HCC)   . TIA (transient ischemic attack) 05/31/2014  . History of stroke 04/22/2012  . Hypokalemia 04/22/2012  . Diabetes mellitus (HCC) 04/22/2012  . Essential hypertension 04/22/2012   Past Medical History:  Diagnosis Date  . Acid reflux   . Anxiety   . Asthma   . Degenerative disc disease   . Depression   . Diabetes mellitus   . Hypertension   . Hypoglycemia 12/30/2016  . Stroke [facility])    04/22/12    Family History  Problem Relation Age of Onset  . Hypertension Other     Past Surgical History:  Procedure Laterality Date  . CARDIAC CATHETERIZATION N/A 11/21/2015   Procedure: Left Heart Cath and Coronary Angiography;  Surgeon: Peter M Swaziland, MD;  Location: [facility] INVASIVE CV LAB;  Service: Cardiovascular;  Laterality: N/A;  . CARDIAC CATHETERIZATION N/A 11/21/2015   Procedure: Coronary Stent Intervention;  Surgeon: Peter M Swaziland, MD;  Location: [facility] INVASIVE CV LAB;  Service: Cardiovascular;  Laterality: N/A;  . KNEE SURGERY    . TUBAL LIGATION     Social History   Occupational History  . Not on file  Tobacco Use  . Smoking status: Current Every Day Smoker    Packs/day: 0.25    Years: 45.00    Pack years: 11.25    Types: Cigarettes  . Smokeless tobacco: Never Used  Vaping Use  . Vaping Use: Never used  Substance and Sexual Activity  . Alcohol use: No  . Drug use: No  . Sexual activity: Never

## 2020-03-12 ENCOUNTER — Ambulatory Visit: Payer: Medicaid Other | Admitting: Orthopaedic Surgery

## 2020-03-22 ENCOUNTER — Other Ambulatory Visit: Payer: Self-pay | Admitting: Cardiovascular Disease

## 2020-04-18 ENCOUNTER — Ambulatory Visit: Payer: Medicaid Other | Admitting: Orthopaedic Surgery

## 2020-05-16 ENCOUNTER — Ambulatory Visit: Payer: Medicaid Other | Admitting: Orthopaedic Surgery

## 2020-05-16 ENCOUNTER — Encounter: Payer: Self-pay | Admitting: Orthopaedic Surgery

## 2020-05-16 DIAGNOSIS — M25562 Pain in left knee: Secondary | ICD-10-CM

## 2020-05-16 DIAGNOSIS — G8929 Other chronic pain: Secondary | ICD-10-CM

## 2020-05-16 DIAGNOSIS — M25561 Pain in right knee: Secondary | ICD-10-CM | POA: Diagnosis not present

## 2020-05-16 MED ORDER — LIDOCAINE HCL 1 % IJ SOLN
3.0000 mL | INTRAMUSCULAR | Status: AC | PRN
Start: 2020-05-16 — End: 2020-05-16
  Administered 2020-05-16: 3 mL

## 2020-05-16 MED ORDER — METHYLPREDNISOLONE ACETATE 40 MG/ML IJ SUSP
40.0000 mg | INTRAMUSCULAR | Status: AC | PRN
  Administered 2020-05-16: 40 mg via INTRA_ARTICULAR

## 2020-05-16 NOTE — Progress Notes (Signed)
Office Visit Note   Patient: Miranda [name]           Date of Birth: [birth year]           MRN: [id number] Visit Date: 05/16/2020              Requested by: [name], MD [address] [place],  [place] [address] PCP: [name], MD   Assessment & Plan: Visit Diagnoses:  1. Chronic pain of left knee   2. Chronic pain of right knee     Plan: I did provide steroid injections per her request in both knees today.  She will watch her blood glucose levels closely.  At the end of the visit she did request for an appoint with Dr. Alvester Morin to have injections in her spine again.  He last did bilateral facet injections at L5-S1 in January 2021.  She said that it helped greatly and those were the best injection she has had before when comparing those to her pain management clinic.  We will see if we can set her up for bilateral L5-S1 facet injections.  Follow-up from my standpoint can be as needed.  Follow-Up Instructions: Return if symptoms worsen or fail to improve.   Orders:  Orders Placed This Encounter  Procedures  . Large Joint Inj: R knee  . Large Joint Inj: L knee   No orders of the defined types were placed in this encounter.     Procedures: Large Joint Inj: R knee on 05/16/2020 1:52 PM Indications: diagnostic evaluation and pain Details: 22 G 1.5 in needle, superolateral approach  Arthrogram: No  Medications: 3 mL lidocaine 1 %; 40 mg methylPREDNISolone acetate 40 MG/ML Outcome: tolerated well, no immediate complications Procedure, treatment alternatives, risks and benefits explained, specific risks discussed. Consent was given by the patient. Immediately prior to procedure a time out was called to verify the correct patient, procedure, equipment, support staff and site/side marked as required. Patient was prepped and draped in the usual sterile fashion.   Large Joint Inj: L knee on 05/16/2020 1:52 PM Indications: diagnostic evaluation and pain Details: 22 G 1.5 in  needle, superolateral approach  Arthrogram: No  Medications: 3 mL lidocaine 1 %; 40 mg methylPREDNISolone acetate 40 MG/ML Outcome: tolerated well, no immediate complications Procedure, treatment alternatives, risks and benefits explained, specific risks discussed. Consent was given by the patient. Immediately prior to procedure a time out was called to verify the correct patient, procedure, equipment, support staff and site/side marked as required. Patient was prepped and draped in the usual sterile fashion.       Clinical Data: No additional findings.   Subjective: Chief Complaint  Patient presents with  . Right Knee - Pain, Follow-up  . Left Knee - Follow-up, Pain  The patient is well-known to me.  She has bilateral knee chronic pain with osteoarthritis in her knees.  She does walk with a cane.  She comes in requesting steroid injections in both knees today.  She is a diabetic but reports good control.  She has had no acute change in her medical status.  The last time injected her knees with 6 months ago.  She does walk with a cane.  Her knee pain is daily and she has well-documented osteoarthritis of those knees.  HPI  Review of Systems She currently denies any headache, chest pain, shortness of breath, fever, chills, nausea, vomiting  Objective: Vital Signs: There were no vitals taken for this visit.  Physical Exam  She is alert and orient x3 and in no acute distress.  She ambulates very slowly. Ortho Exam Examination of both knees shows no effusion.  Both knees have varus malalignment and patellofemoral crepitation with a painful arc of motion and medial joint line tenderness of both knees. Specialty Comments:  No specialty comments available.  Imaging: No results found.   PMFS History: Patient Active Problem List   Diagnosis Date Noted  . Acute right-sided low back pain without sciatica   . Peripheral neuropathy 02/08/2018  . Acute pancreatitis 02/07/2018  . CAD  S/P percutaneous coronary angioplasty 01/29/2017  . Tobacco abuse 01/29/2017  . Chronic back pain 01/29/2017  . Hypoglycemia 12/30/2016  . Insulin dependent diabetes mellitus 11/21/2015  . Dyslipidemia 11/21/2015  . NSTEMI (non-ST elevated myocardial infarction) (HCC)   . TIA (transient ischemic attack) 05/31/2014  . History of stroke 04/22/2012  . Hypokalemia 04/22/2012  . Diabetes mellitus (HCC) 04/22/2012  . Essential hypertension 04/22/2012   Past Medical History:  Diagnosis Date  . Acid reflux   . Anxiety   . Asthma   . Degenerative disc disease   . Depression   . Diabetes mellitus   . Hypertension   . Hypoglycemia 12/30/2016  . Stroke [facility])    04/22/12    Family History  Problem Relation Age of Onset  . Hypertension Other     Past Surgical History:  Procedure Laterality Date  . CARDIAC CATHETERIZATION N/A 11/21/2015   Procedure: Left Heart Cath and Coronary Angiography;  Surgeon: Peter M Swaziland, MD;  Location: [facility] INVASIVE CV LAB;  Service: Cardiovascular;  Laterality: N/A;  . CARDIAC CATHETERIZATION N/A 11/21/2015   Procedure: Coronary Stent Intervention;  Surgeon: Peter M Swaziland, MD;  Location: [facility] INVASIVE CV LAB;  Service: Cardiovascular;  Laterality: N/A;  . KNEE SURGERY    . TUBAL LIGATION     Social History   Occupational History  . Not on file  Tobacco Use  . Smoking status: Current Every Day Smoker    Packs/day: 0.25    Years: 45.00    Pack years: 11.25    Types: Cigarettes  . Smokeless tobacco: Never Used  Vaping Use  . Vaping Use: Never used  Substance and Sexual Activity  . Alcohol use: No  . Drug use: No  . Sexual activity: Never

## 2020-05-17 ENCOUNTER — Other Ambulatory Visit: Payer: Self-pay

## 2020-05-17 ENCOUNTER — Other Ambulatory Visit: Payer: Self-pay | Admitting: Cardiovascular Disease

## 2020-05-17 DIAGNOSIS — G8929 Other chronic pain: Secondary | ICD-10-CM

## 2020-05-17 DIAGNOSIS — M5441 Lumbago with sciatica, right side: Secondary | ICD-10-CM

## 2020-05-17 DIAGNOSIS — M48061 Spinal stenosis, lumbar region without neurogenic claudication: Secondary | ICD-10-CM

## 2020-05-18 ENCOUNTER — Other Ambulatory Visit: Payer: Self-pay | Admitting: Cardiovascular Disease

## 2020-06-11 ENCOUNTER — Other Ambulatory Visit: Payer: Self-pay

## 2020-06-11 DIAGNOSIS — G8929 Other chronic pain: Secondary | ICD-10-CM

## 2020-06-11 DIAGNOSIS — M5442 Lumbago with sciatica, left side: Secondary | ICD-10-CM

## 2020-06-13 ENCOUNTER — Telehealth: Payer: Self-pay | Admitting: Orthopaedic Surgery

## 2020-06-13 NOTE — Telephone Encounter (Signed)
Patient is asking for a call back for clarification about referral for back injections. Please call patient at [phone]

## 2020-06-14 NOTE — Telephone Encounter (Signed)
LMOM for patient that we are sending her to PT because her insurance denied the Lumbar ESI with [facility]; after about 6 weeks of PT she needs to see [place] in office so we can re-order the [facility]

## 2020-06-21 ENCOUNTER — Other Ambulatory Visit: Payer: Self-pay | Admitting: Cardiovascular Disease

## 2020-08-10 ENCOUNTER — Other Ambulatory Visit: Payer: Self-pay | Admitting: Cardiovascular Disease

## 2020-08-16 ENCOUNTER — Other Ambulatory Visit: Payer: Self-pay | Admitting: Internal Medicine

## 2020-08-16 DIAGNOSIS — E2839 Other primary ovarian failure: Secondary | ICD-10-CM

## 2020-09-25 IMAGING — CR DG ABDOMEN ACUTE W/ 1V CHEST
4 series · 4 of 4 positions shown · non-contrast
Comparison: CT abdomen and pelvis from 8793

CLINICAL DATA: Abdominal pain, bilateral foot swelling

EXAM:
DG ABDOMEN ACUTE W/ 1V CHEST

[w chest pa]
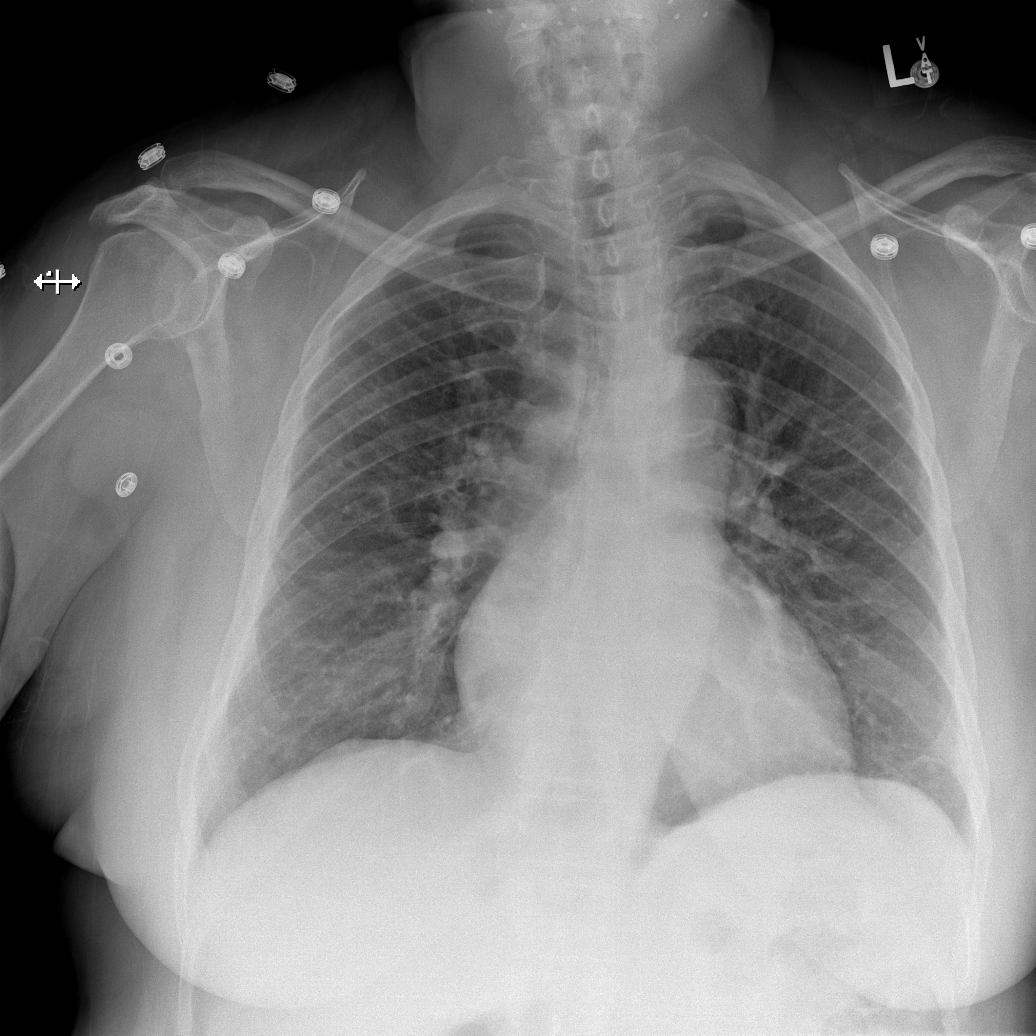

[w abdomen upright]
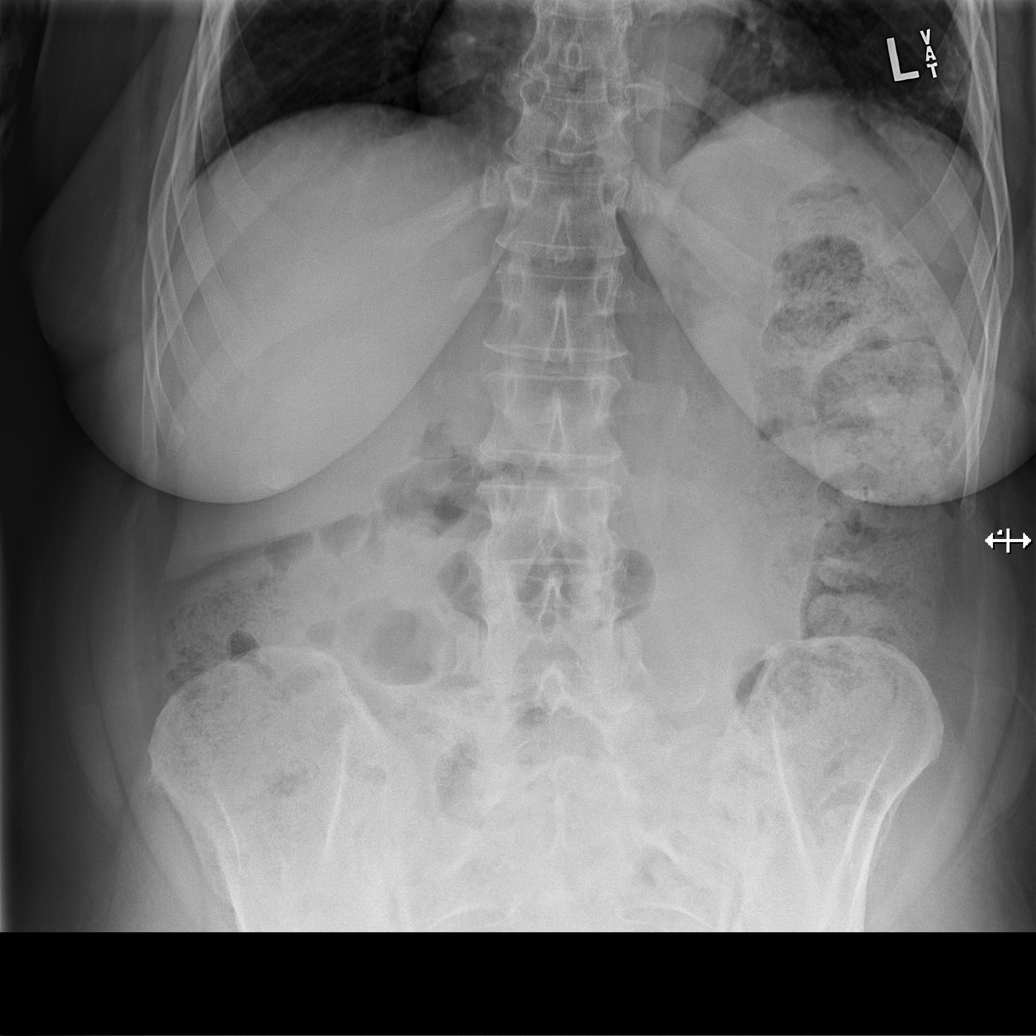

[t abdomen supine (1 of 2)]
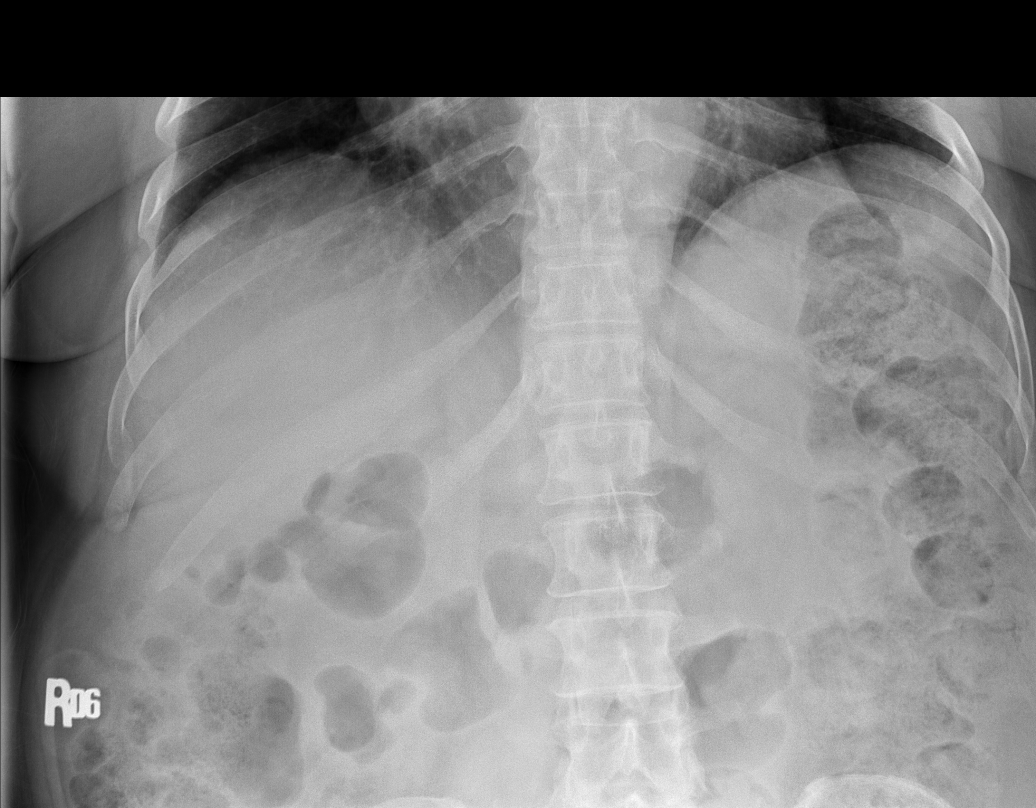

[t abdomen supine (2 of 2)]
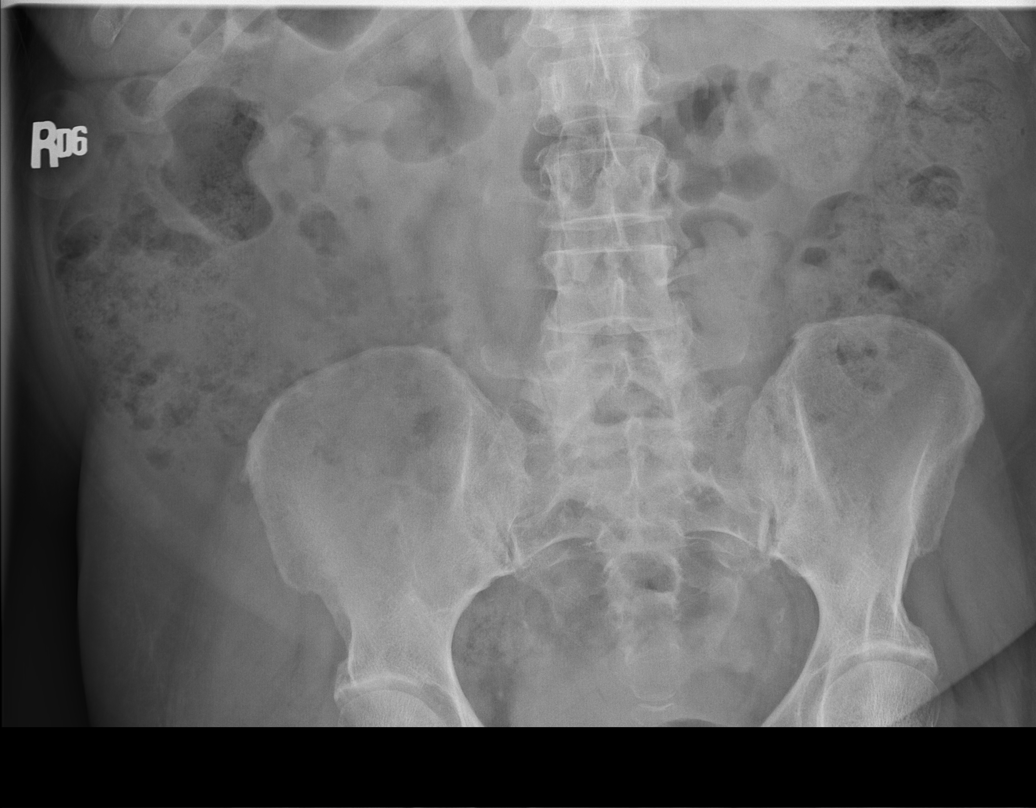

[4 of 4 positions shown; findings below may reference images not displayed]

FINDINGS: Cardiomediastinal contours with mild cardiac enlargement.
Engorgement of central pulmonary vasculature. Lungs are clear. No
sign of pleural effusion.

Bony structures of the thorax grossly unremarkable. No free air
beneath either RIGHT or LEFT hemidiaphragm.

Abundant stool throughout the colon. No air-fluid levels. The

No abnormal calcifications.

Visualized skeletal structures with spinal degenerative changes and
limited assessment.
IMPRESSION: Mild cardiac enlargement.  No consolidation, effusion or edema.

No free air or obstruction with abundant colonic stool.

## 2020-10-04 ENCOUNTER — Ambulatory Visit: Payer: Medicaid Other | Attending: Orthopaedic Surgery

## 2020-10-09 ENCOUNTER — Other Ambulatory Visit: Payer: Self-pay | Admitting: Cardiovascular Disease

## 2020-11-08 ENCOUNTER — Other Ambulatory Visit: Payer: Self-pay | Admitting: Cardiology

## 2020-12-31 ENCOUNTER — Ambulatory Visit: Payer: Medicaid Other | Admitting: Physician Assistant

## 2021-01-14 ENCOUNTER — Ambulatory Visit: Payer: Medicaid Other | Admitting: Physician Assistant

## 2021-01-22 ENCOUNTER — Other Ambulatory Visit: Payer: Self-pay

## 2021-01-22 ENCOUNTER — Encounter: Payer: Self-pay | Admitting: Physician Assistant

## 2021-01-22 ENCOUNTER — Ambulatory Visit (INDEPENDENT_AMBULATORY_CARE_PROVIDER_SITE_OTHER): Payer: Medicaid Other | Admitting: Physician Assistant

## 2021-01-22 DIAGNOSIS — M5441 Lumbago with sciatica, right side: Secondary | ICD-10-CM

## 2021-01-22 DIAGNOSIS — G8929 Other chronic pain: Secondary | ICD-10-CM

## 2021-01-22 DIAGNOSIS — M5442 Lumbago with sciatica, left side: Secondary | ICD-10-CM | POA: Diagnosis not present

## 2021-01-22 DIAGNOSIS — M1711 Unilateral primary osteoarthritis, right knee: Secondary | ICD-10-CM

## 2021-01-22 DIAGNOSIS — M170 Bilateral primary osteoarthritis of knee: Secondary | ICD-10-CM

## 2021-01-22 DIAGNOSIS — M1712 Unilateral primary osteoarthritis, left knee: Secondary | ICD-10-CM

## 2021-01-22 MED ORDER — LIDOCAINE HCL 1 % IJ SOLN
3.0000 mL | INTRAMUSCULAR | Status: AC | PRN
  Administered 2021-01-22: 3 mL

## 2021-01-22 MED ORDER — METHYLPREDNISOLONE ACETATE 40 MG/ML IJ SUSP
40.0000 mg | INTRAMUSCULAR | Status: AC | PRN
  Administered 2021-01-22: 40 mg via INTRA_ARTICULAR

## 2021-01-22 NOTE — Addendum Note (Signed)
Addended by: Barbette Or on: 01/22/2021 04:41 PM   Modules accepted: Orders

## 2021-01-22 NOTE — Progress Notes (Signed)
   Procedure Note  Patient: Miranda [name]             Date of Birth: Sep 17, 1955           MRN: [id number]             Visit Date: 01/22/2021 HPI: Miranda [name] returns today due to bilateral knee pain.  She is asking for injections both knees.  She has known osteoarthritis both knees.  Last injections 05/16/2020 gave her good relief.  She is diabetic reports her hemoglobin A1c to be around 7.  She is also asking for epidural steroid injections in her back.  She states she has had no change in her overall back pain.  We did try earlier this year to have her undergo bilateral L5-S1 and injections.  These have helped in the past.  Physical exam General well-developed well-nourished female no acute distress. Ambulates with a cane. Bilateral knees: Good range of motion of both knees.  No abnormal warmth erythema or effusion of either knee.  Tenderness along medial joint line of both knees.  Procedures: Visit Diagnoses:  1. Unilateral primary osteoarthritis, left knee   2. Unilateral primary osteoarthritis, right knee     Large Joint Inj: bilateral knee on 01/22/2021 4:20 PM Indications: pain Details: 22 G 1.5 in needle, anterolateral approach  Arthrogram: No  Medications (Right): 3 mL lidocaine 1 %; 40 mg methylPREDNISolone acetate 40 MG/ML Medications (Left): 3 mL lidocaine 1 %; 40 mg methylPREDNISolone acetate 40 MG/ML Outcome: tolerated well, no immediate complications Procedure, treatment alternatives, risks and benefits explained, specific risks discussed. Consent was given by the patient. Immediately prior to procedure a time out was called to verify the correct patient, procedure, equipment, support staff and site/side marked as required. Patient was prepped and draped in the usual sterile fashion.    Plan: She understands she needs to wait at least 3 months between cortisone injections both knees.  She will also watch her glucose levels closely over the next 24 to 48 hours and  understands that the cortisone injections may raise her glucose levels.  In regards to the epidural steroid injection we will place the order for her to have bilateral facet injections at L5-S1.  Questions were encouraged and answered at length.

## 2021-01-24 ENCOUNTER — Other Ambulatory Visit: Payer: Self-pay | Admitting: Cardiovascular Disease

## 2021-03-06 ENCOUNTER — Other Ambulatory Visit: Payer: Self-pay | Admitting: Cardiovascular Disease

## 2021-03-27 ENCOUNTER — Encounter: Payer: Self-pay | Admitting: Orthopaedic Surgery

## 2021-03-27 ENCOUNTER — Ambulatory Visit (INDEPENDENT_AMBULATORY_CARE_PROVIDER_SITE_OTHER): Payer: Medicaid Other | Admitting: Orthopaedic Surgery

## 2021-03-27 VITALS — Ht 70.0 in

## 2021-03-27 DIAGNOSIS — M25561 Pain in right knee: Secondary | ICD-10-CM | POA: Diagnosis not present

## 2021-03-27 DIAGNOSIS — M1711 Unilateral primary osteoarthritis, right knee: Secondary | ICD-10-CM | POA: Diagnosis not present

## 2021-03-27 DIAGNOSIS — M1712 Unilateral primary osteoarthritis, left knee: Secondary | ICD-10-CM

## 2021-03-27 DIAGNOSIS — M25562 Pain in left knee: Secondary | ICD-10-CM | POA: Diagnosis not present

## 2021-03-27 DIAGNOSIS — G8929 Other chronic pain: Secondary | ICD-10-CM

## 2021-03-27 DIAGNOSIS — M48061 Spinal stenosis, lumbar region without neurogenic claudication: Secondary | ICD-10-CM

## 2021-03-27 NOTE — Progress Notes (Signed)
The patient comes in today with continued bilateral chronic knee pain and low back pain.  She last had steroid injections just 2 months ago in both knees.  She is diabetic and reports that she has good blood glucose control but will have any labs verifying that.  Her primary care physician is now part of epic.  She is on chronic narcotic pain medication.  She is also on Plavix so she cannot take anti-inflammatories.  She has known well-documented bilateral knee osteoarthritis.  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  In the past she was denied for facet joint injections without therapy.  She has been through time and back exercises.  She cannot take anti-inflammatories due to being on Plavix.  She still does not seem to be interested in knee replacement surgery.  She does ambulate using a cane as well.  Both knees have arthritic changes and are painful.  She has pain along the lower lumbar spine as well.  There is really nothing else that I can recommend for her knee since steroid injections have not helped and she does have well-documented tricompartment arthritis of both knees.  The only thing that we can recommend for her knees is knee replacement surgery and she does not seem to be interested in that.  He would certainly be tough on her given the fact that she is already on strong narcotic pain medications at a high dose which may be still difficult to control her pain after surgery.  We will see if we can reorder bilateral L5-S1 facet joint injections.

## 2021-03-28 ENCOUNTER — Other Ambulatory Visit: Payer: Self-pay | Admitting: Physical Medicine and Rehabilitation

## 2021-03-28 ENCOUNTER — Other Ambulatory Visit: Payer: Self-pay

## 2021-03-28 DIAGNOSIS — M5442 Lumbago with sciatica, left side: Secondary | ICD-10-CM

## 2021-03-28 DIAGNOSIS — M48061 Spinal stenosis, lumbar region without neurogenic claudication: Secondary | ICD-10-CM

## 2021-04-14 ENCOUNTER — Other Ambulatory Visit: Payer: Self-pay | Admitting: Cardiovascular Disease

## 2021-04-27 ENCOUNTER — Other Ambulatory Visit: Payer: Self-pay | Admitting: Cardiovascular Disease

## 2021-05-06 ENCOUNTER — Telehealth: Payer: Self-pay | Admitting: Physical Medicine and Rehabilitation

## 2021-05-06 NOTE — Telephone Encounter (Signed)
Received call from Juliane Lack with [facility] needing a call back concerning a prior authorization needed for the patient. The number to contact Tacey Ruiz is [phone]

## 2021-05-14 ENCOUNTER — Telehealth: Payer: Self-pay

## 2021-05-14 NOTE — Telephone Encounter (Signed)
LMOM for patient letting her know that her insurance denied her for Lumbar injections until she has 6 weeks of documented PT. I told her to give me a call back with a good number to reach her if she would like to go to PT

## 2021-05-15 ENCOUNTER — Ambulatory Visit (HOSPITAL_COMMUNITY)
Admission: EM | Admit: 2021-05-15 | Discharge: 2021-05-15 | Disposition: A | Discharge status: Home / Self Care | Payer: Medicaid Other | Attending: Family Medicine | Admitting: Family Medicine

## 2021-05-15 ENCOUNTER — Other Ambulatory Visit: Payer: Self-pay

## 2021-05-15 ENCOUNTER — Encounter (HOSPITAL_COMMUNITY): Payer: Self-pay

## 2021-05-15 DIAGNOSIS — Z20822 Contact with and (suspected) exposure to covid-19: Secondary | ICD-10-CM | POA: Diagnosis present

## 2021-05-15 NOTE — Progress Notes (Deleted)
Cardiology Clinic Note   Patient Name: Miranda [name] Date of Encounter: 05/15/2021  Primary Care Provider:  Nolene Ebbs, MD Primary Cardiologist:  Quay Burow, MD  Patient Profile    Miranda [name] 66 year old female presents to the clinic today for follow-up evaluation of her coronary artery disease and hypertension.  Past Medical History    Past Medical History:  Diagnosis Date   Acid reflux    Anxiety    Asthma    Degenerative disc disease    Depression    Diabetes mellitus    Hypertension    Hypoglycemia 12/30/2016   Stroke ([place])    04/22/12   Past Surgical History:  Procedure Laterality Date   CARDIAC CATHETERIZATION N/A 11/21/2015   Procedure: Left Heart Cath and Coronary Angiography;  Surgeon: Peter M Martinique, MD;  Location: [place] [facility];  Service: Cardiovascular;  Laterality: N/A;   CARDIAC CATHETERIZATION N/A 11/21/2015   Procedure: Coronary Stent Intervention;  Surgeon: Peter M Martinique, MD;  Location: [place] [facility];  Service: Cardiovascular;  Laterality: N/A;   KNEE SURGERY     TUBAL LIGATION      Allergies  No Known Allergies  History of Present Illness    Miranda [name] has a PMH of TIA, essential hypertension, NSTEMI, acute pancreatitis, diabetes, hypokalemia, dyslipidemia, tobacco abuse, and right-sided lower back pain.  She was initially seen by Dr. Gwenlyn Found in 2019.  At that time she was smoking 3-5 cigarettes/day.  She is disabled due to back and knee pain as well as her diabetes.  She was previously admitted with chest pain and minimally elevated troponins.  She underwent cardiac catheterization 8/17 which showed 90% proximal AV groove circumflex 60%, distal LAD lesion.  Her circumflex was stented with DES.  She denied recurrent symptoms.  She was seen in follow-up by Dr. Gwenlyn Found on 03/08/2019.  During that time she continued to do well.  She continued to smoke 5-10 cigarettes/day.  She denied chest pain shortness of breath.  She  presents to the clinic today for follow-up evaluation states***  *** denies chest pain, shortness of breath, lower extremity edema, fatigue, palpitations, melena, hematuria, hemoptysis, diaphoresis, weakness, presyncope, syncope, orthopnea, and PND.   Home Medications    Prior to Admission medications   Medication Sig Start Date End Date Taking? Authorizing Provider  ACCU-CHEK AVIVA PLUS test strip 1 each as needed. 10/14/16   [provider]  albuterol (PROVENTIL HFA;VENTOLIN HFA) 108 (90 Base) MCG/ACT inhaler Inhale 2 puffs into the lungs every 6 (six) hours as needed for wheezing or shortness of breath.    [provider]  amLODipine (NORVASC) 5 MG tablet Take 5 mg by mouth daily. 08/14/19   [provider]  aspirin 81 MG chewable tablet Chew 1 tablet (81 mg total) by mouth daily. Patient not taking: Reported on 09/05/2019 02/11/18   Debbe Odea, MD  atorvastatin (LIPITOR) 80 MG tablet TAKE 1 TABLET EVERY DAY AT 6 PM 06/21/20   Martinique, Peter M, MD  B-D ULTRAFINE III SHORT PEN 31G X 8 MM MISC 1 each as needed. 11/18/16   [provider]  carvedilol (COREG) 12.5 MG tablet TAKE 1 TABLET (12.5 MG TOTAL) BY MOUTH 2 (TWO) TIMES DAILY WITH A MEAL. 05/18/20   Lorretta Harp, MD  cloNIDine (CATAPRES) 0.1 MG tablet Take 0.1 mg by mouth daily as needed (sleep).  08/14/19   [provider]  clopidogrel (PLAVIX) 75 MG tablet TAKE 1 TABLET (75 MG  TOTAL) BY MOUTH DAILY. NEEDS APPOINTMENT WITH DR Gwenlyn Found FOR FUTURE REFILLS 04/29/21   Lorretta Harp, MD  Cyanocobalamin (B-12) 1000 MCG CAPS Take 1,000 mcg by mouth daily.     [provider]  diclofenac sodium (VOLTAREN) 1 % GEL Apply 4 g topically 4 (four) times daily. Patient not taking: Reported on 09/05/2019 02/11/18   Debbe Odea, MD  diclofenac Sodium (VOLTAREN) 1 % GEL Apply 1 application topically 4 (four) times daily as needed for pain. 08/31/19   [provider]  furosemide (LASIX) 20 MG tablet  Take 1 tablet (20 mg total) by mouth daily. 09/05/19   Lacretia Leigh, MD  gabapentin (NEURONTIN) 300 MG capsule Take 300 mg by mouth 3 (three) times daily.     [provider]  Glycerin, Adult, 2.1 g SUPP Place 1 suppository rectally daily as needed for moderate constipation. 02/11/18   Debbe Odea, MD  LANTUS SOLOSTAR 100 UNIT/ML Solostar Pen Inject 50 Units into the skin in the morning and at bedtime. 08/15/19   [provider]  LORazepam (ATIVAN) 0.5 MG tablet Take 1 tablet (0.5 mg total) by mouth 2 (two) times daily as needed for anxiety. 08/21/19   Charlann Lange, PA-C  losartan (COZAAR) 25 MG tablet Take 25 mg by mouth daily.    [provider]  metFORMIN (GLUCOPHAGE) 1000 MG tablet Take 1,000 mg by mouth 2 (two) times daily. 08/14/19   [provider]  metFORMIN (GLUMETZA) 1000 MG (MOD) 24 hr tablet Take 1 tablet (1,000 mg total) by mouth 2 (two) times daily with a meal. Restart tomorrow Patient not taking: Reported on 09/05/2019 11/23/15   Domenic Polite, MD  MOVANTIK 25 MG TABS tablet Take 25 mg by mouth daily before breakfast.  12/12/16   [provider]  nicotine (NICODERM CQ - DOSED IN MG/24 HR) 7 mg/24hr patch Place 1 patch (7 mg total) onto the skin daily. Patient not taking: Reported on 09/05/2019 02/11/18   Debbe Odea, MD  ondansetron (ZOFRAN ODT) 4 MG disintegrating tablet Take 1 tablet (4 mg total) by mouth every 6 (six) hours as needed. 02/05/18   Ward, Delice Bison, DO  oxyCODONE-acetaminophen (PERCOCET) 7.5-325 MG tablet Take 1 tablet by mouth every 4 (four) hours as needed for severe pain.    [provider]  polyethylene glycol (MIRALAX / GLYCOLAX) packet Take 17 g by mouth daily as needed. Patient not taking: Reported on 09/05/2019 02/11/18   Debbe Odea, MD  potassium chloride (KLOR-CON) 10 MEQ tablet Take 1 tablet (10 mEq total) by mouth daily. 09/05/19   Lacretia Leigh, MD  traZODone (DESYREL) 50 MG tablet Take 50 mg by mouth  at bedtime as needed for sleep.     [provider]  Vitamin D, Ergocalciferol, (DRISDOL) 50000 units CAPS capsule Take 50,000 Units by mouth every 7 (seven) days. monday    [provider]    Family History    Family History  Problem Relation Age of Onset   Hypertension Other    She indicated that her mother is deceased. She indicated that her father is deceased. She indicated that the status of her other is unknown.  Social History    Social History   Socioeconomic History   Marital status: Divorced    Spouse name: Not on file   Number of children: Not on file   Years of education: Not on file   Highest education level: Not on file  Occupational History   Not on file  Tobacco Use   Smoking status: Every Day    Packs/day: 0.25    Years: 45.00    Pack years: 11.25    Types: Cigarettes   Smokeless tobacco: Never  Vaping Use   Vaping Use: Never used  Substance and Sexual Activity   Alcohol use: No   Drug use: No   Sexual activity: Never  Other Topics Concern   Not on file  Social History Narrative   Not on file   Social Determinants of Health   Financial Resource Strain: Not on file  Food Insecurity: Not on file  Transportation Needs: Not on file  Physical Activity: Not on file  Stress: Not on file  Social Connections: Not on file  Intimate Partner Violence: Not on file     Review of Systems    General:  No chills, fever, night sweats or weight changes.  Cardiovascular:  No chest pain, dyspnea on exertion, edema, orthopnea, palpitations, paroxysmal nocturnal dyspnea. Dermatological: No rash, lesions/masses Respiratory: No cough, dyspnea Urologic: No hematuria, dysuria Abdominal:   No nausea, vomiting, diarrhea, bright red blood per rectum, melena, or hematemesis Neurologic:  No visual changes, wkns, changes in mental status. All other systems reviewed and are otherwise negative except as noted above.  Physical Exam    VS:  There were  no vitals taken for this visit. , BMI There is no height or weight on file to calculate BMI. GEN: Well nourished, well developed, in no acute distress. HEENT: normal. Neck: Supple, no JVD, carotid bruits, or masses. Cardiac: RRR, no murmurs, rubs, or gallops. No clubbing, cyanosis, edema.  Radials/DP/PT 2+ and equal bilaterally.  Respiratory:  Respirations regular and unlabored, clear to auscultation bilaterally. GI: Soft, nontender, nondistended, BS + x 4. MS: no deformity or atrophy. Skin: warm and dry, no rash. Neuro:  Strength and sensation are intact. Psych: Normal affect.  Accessory Clinical Findings    Recent Labs: No results found for requested labs within last 8760 hours.   Recent Lipid Panel    Component Value Date/Time   CHOL 162 03/08/2019 0919   TRIG 104 03/08/2019 0919   HDL 38 (L) 03/08/2019 0919   CHOLHDL 4.3 03/08/2019 0919   CHOLHDL 5.0 02/08/2018 0639   VLDL 18 02/08/2018 0639   LDLCALC 105 (H) 03/08/2019 0919    ECG personally reviewed by me today- *** - No acute changes  Echocardiogram 11/21/2015 Study Conclusions   - Left ventricle: The cavity size was normal. There was moderate    concentric hypertrophy. Systolic function was vigorous. The    estimated ejection fraction was in the range of 65% to 70%. There    was dynamic obstruction at restin the mid cavity, with a peak    velocity of 238 cm/sec and a peak gradient of 23 mm Hg. Wall    motion was normal; there were no regional wall motion    abnormalities. Doppler parameters are consistent with abnormal    left ventricular relaxation (grade 1 diastolic dysfunction).    Doppler parameters are consistent with indeterminate ventricular    filling pressure.  - Aortic valve: Transvalvular velocity was within the normal range.    There was no stenosis. There was no regurgitation.  - Mitral valve: Transvalvular velocity was within the normal range.    There was no evidence for stenosis. There was no  regurgitation.  - Right ventricle: The cavity size was normal. Wall thickness was    normal. Systolic function was normal.  - Tricuspid valve:  There was no regurgitation.  Cardiac catheterization 11/21/2015  Dist LAD lesion, 60 %stenosed. There is hyperdynamic left ventricular systolic function. LV end diastolic pressure is moderately elevated. The left ventricular ejection fraction is greater than 65% by visual estimate. A STENT SYNERGY DES 3.5X12 drug eluting stent was successfully placed. Prox Cx lesion, 90 %stenosed. Post intervention, there is a 0% residual stenosis.   1. 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 2. Hyperdynamic LV function 3. Successful stenting of the proximal LCx with a DES   Plan: DAPT for one year. Aggressive risk factor modification.    Diagnostic Dominance: Left Intervention    Assessment & Plan   1.  Coronary artery disease-no recent episodes of arm neck back or chest discomfort.  Underwent cardiac catheterization 8/17 which showed 90% proximal AV groove circumflex, 60%, and distal LAD lesion.  She received PCI with DES to her circumflex. Continue amlodipine, aspirin, atorvastatin, carvedilol, Plavix Heart healthy low-sodium diet-salty 6 given Increase physical activity as tolerated  Hyperlipidemia-LDL*** Continue aspirin, atorvastatin, Heart healthy low-sodium high-fiber diet Increase physical activity as tolerated  Essential hypertension-BP today***.  Well-controlled at home. Continue losartan, furosemide, potassium, clonidine, amlodipine Heart healthy low-sodium diet-salty 6 given Increase physical activity as tolerated  Tobacco abuse-only occasionally smoking through the week..  Trying to quit with transdermal nicotine patch  continue NicoDerm CQ Smoking cessation encouraged Smoking cessation information given Follows with PCP  Disposition: Follow-up with Dr. Gwenlyn Found or me in 6  months.  Jossie Ng. Cyris Maalouf NP-C    05/15/2021, 11:05 AM [address] [place] [address] Office [phone] Fax [phone]  Notice: This dictation was prepared with Dragon dictation along with smaller phrase technology. Any transcriptional errors that result from this process are unintentional and may not be corrected upon review.  I spent***minutes examining this patient, reviewing medications, and using patient centered shared decision making involving her cardiac care.  Prior to her visit I spent greater than 20 minutes reviewing her past medical history,  medications, and prior cardiac tests.

## 2021-05-15 NOTE — ED Triage Notes (Signed)
Pt presents to the office today for covid testing.Pt was inform her friend has covid and she needed to be tested. Pt denies any symptoms of Covid. Order placed- patient informed we will call her with test results.

## 2021-05-16 ENCOUNTER — Ambulatory Visit: Payer: Medicaid Other | Admitting: General Practice

## 2021-05-16 LAB — SARS CORONAVIRUS 2 (TAT 6-24 HRS): SARS Coronavirus 2: NEGATIVE

## 2021-05-28 ENCOUNTER — Other Ambulatory Visit: Payer: Self-pay

## 2021-05-28 ENCOUNTER — Encounter: Payer: Self-pay | Admitting: Physical Medicine and Rehabilitation

## 2021-05-28 ENCOUNTER — Ambulatory Visit (INDEPENDENT_AMBULATORY_CARE_PROVIDER_SITE_OTHER): Payer: Medicaid Other | Admitting: Physical Medicine and Rehabilitation

## 2021-05-28 VITALS — BP 136/86 | HR 88

## 2021-05-28 DIAGNOSIS — G894 Chronic pain syndrome: Secondary | ICD-10-CM

## 2021-05-28 DIAGNOSIS — M545 Low back pain, unspecified: Secondary | ICD-10-CM

## 2021-05-28 DIAGNOSIS — G8929 Other chronic pain: Secondary | ICD-10-CM

## 2021-05-28 DIAGNOSIS — M47816 Spondylosis without myelopathy or radiculopathy, lumbar region: Secondary | ICD-10-CM

## 2021-05-28 NOTE — Progress Notes (Signed)
Pt state lower back pain that travels down both knee and foot. Pt state walking, standing and laying down makes the pain worse. Pt state she takes pain meds to help ease her pain.  Numeric Pain Rating Scale and Functional Assessment Average Pain 10 Pain Right Now 10 My pain is intermittent, constant, sharp, burning, dull, stabbing, tingling, and aching Pain is worse with: walking, bending, sitting, standing, some activites, and laying down Pain improves with: heat/ice and medication   In the last MONTH (on 0-10 scale) has pain interfered with the following?  1. General activity like being  able to carry out your everyday physical activities such as walking, climbing stairs, carrying groceries, or moving a chair?  Rating(7)  2. Relation with others like being able to carry out your usual social activities and roles such as  activities at home, at work and in your community. Rating(8)  3. Enjoyment of life such that you have  been bothered by emotional problems such as feeling anxious, depressed or irritable?  Rating(9)

## 2021-05-28 NOTE — Progress Notes (Signed)
Miranda [name] - 66 y.o. female MRN [id number]  Date of birth: 06/21/1955  Office Visit Note: Visit Date: 05/28/2021 PCP: [name], MD Referred by: [name], MD  Subjective: Chief Complaint  Patient presents with   Lower Back - Pain   Right Knee - Pain   Left Knee - Pain   Left Foot - Pain   Right Foot - Pain   HPI: Miranda [name] is a 66 y.o. female who comes in today at the request of Dr. Doneen Poisson for evaluation of chronic, worsening and severe bilateral lower back pain with intermittent radiation of pain to middle/upper back. Patient reports pain has been ongoing for several years. She reports pain is exacerbated by movement and activity, describes as constant soreness, currently rates as 9 out of 10. Patient reports some relief of pain with home exercise regimen, rest and use of medications. Patient currently taking Percocet prescribed by Merryl Hacker, NP at [facility]. Patient also reports use of Voltaren gel as needed. Patients lumbar MRI from 2018 exhibits moderate to severe facet hypertrophy, most severe at L5-S1. No high grade spinal canal stenosis noted. Patient had bilateral L5-S1 facet joint injections in 2021 and reports good relief diagnostically. Patient states her severe pain makes is difficult for her to ambulate and perform daily tasks. Patient currently using cane to assist with ambulation and prevent falls. Patient is currently being treated by Dr. Magnus Ivan for chronic bilateral knee pain.   Patients course is complicated by TIA, hypertension, STEMI, diabetes mellitus, tobacco abuse and peripheral neuropathy.   Review of Systems  Musculoskeletal:  Positive for back pain and myalgias.  Neurological:  Negative for tingling, sensory change, focal weakness and weakness.  All other systems reviewed and are negative. Otherwise per HPI.  Assessment & Plan: Visit Diagnoses:    ICD-10-CM   1. Chronic bilateral low back pain without  sciatica  M54.50 Ambulatory referral to Physical Medicine Rehab   G89.29     2. Spondylosis without myelopathy or radiculopathy, lumbar region  M47.816 Ambulatory referral to Physical Medicine Rehab    3. Facet hypertrophy of lumbar region  M47.816 Ambulatory referral to Physical Medicine Rehab    4. Chronic pain syndrome  G89.4        Plan: Findings:  Chronic, worsening and severe bilateral axial back pain with intermittent radiation of pain to middle/upper back. Patient continues to have excruciating and debilitating pain despite good conservative therapies such as home exercise regimen, rest and use of medications. Patients clinical presentation and exam are consistent with facet mediated pain. We also feel there is a myofascial component contributing to her pain as her discomfort does radiate to middle/upper back regions. We feel the next step is to complete diagnostic and hopefully therapeutic bilateral L5-S1 facet joint/medial branch blocks under fluoroscopic guidance. If patient gets good pain relief with facet joint/medial branch blocks we did discuss possibility of longer sustained pain relief with radiofrequency ablation. We did provide patient with educational material regarding radiofrequency ablation to take home and review. If patients pain persists after facet joint/medial branch blocks we would consider formal physical therapy, obtaining new lumbar MRI imaging and medication management. Patient encouraged to remain active and to continue taking pain medications as prescribed. No red flag symptoms noted upon exam today.    Meds & Orders: No orders of the defined types were placed in this encounter.   Orders Placed This Encounter  Procedures   Ambulatory referral to Physical Medicine Rehab  Follow-up: Return for Bilateral L5-S1 facet joint/medial branch blocks.   Procedures: No procedures performed      Clinical History: MRI LUMBAR SPINE WITHOUT CONTRAST    TECHNIQUE: Multiplanar, multisequence MR imaging of the lumbar spine was performed. No intravenous contrast was administered.   COMPARISON:  Lumbar radiograph 10/11/2014.  Lumbar MRI 04/12/2008.   FINDINGS: Segmentation: Normal lumbar segmentation demonstrated on the radiographs which is the same numbering system used on the 08-20-07 MRI.   Alignment:  Stable since 08/20/2007, preserved lumbar lordosis.   Vertebrae: Visualized bone marrow signal is within normal limits. No marrow edema or evidence of acute osseous abnormality. Intact visible sacrum and SI joints.   Conus medullaris: Extends to the L1 level and appears normal.   Paraspinal and other soft tissues: Negative.   Disc levels:   Visible lower thoracic levels are negative.   L1-L2: Disc desiccation and mild circumferential disc bulge have developed since Aug 20, 2007, but there is no associated stenosis.   L2-L3: Stable minor circumferential disc bulge, mild facet hypertrophy and trace chronic facet joint fluid. No stenosis.   L3-L4: Chronic circumferential disc bulge with broad-based mild foraminal components. Mild facet and ligament flavum hypertrophy. Regressed facet joint fluid. Increased mild epidural lipomatosis. However, no spinal or lateral recess stenosis. Up to mild left greater than right L3 neural foraminal stenosis is stable since 08/20/07.   L4-L5: Mild foraminal disc bulging greater on the left. Mild to moderate facet and ligament flavum hypertrophy. No spinal or lateral recess stenosis. Up to mild left L4 neural foraminal stenosis is stable.   L5-S1: Moderate to severe bilateral facet hypertrophy. Normal disc. Borderline to mild L5 foraminal stenosis is stable since 20-Aug-2007.   IMPRESSION: 1. Mild for age lumbar spine degeneration which is largely largely unchanged since 2007-08-20. The most pronounced degenerative finding is moderate and severe lower lumbar facet arthropathy, maximal at L5-S1. No acute osseous  abnormality. 2. Mild disc degeneration has developed at L1-L2 since the Aug 20, 2007 MRI, but there is no associated stenosis. 3. No lumbar spinal or lateral recess stenosis. Up to mild neural foraminal stenosis at the bilateral L3, the left L4, and the bilateral L5 nerve levels is stable since 08/20/2007.     Electronically Signed   By: Odessa Fleming M.D.   On: 01/24/2017 15:33   She reports that she has been smoking cigarettes. She has a 11.25 pack-year smoking history. She has never used smokeless tobacco. No results for input(s): HGBA1C, LABURIC in the last 8760 hours.  Objective:  VS:  HT:     WT:    BMI:      BP:136/86   HR:88bpm   TEMP: ( )   RESP:  Physical Exam Vitals and nursing note reviewed.  HENT:     Head: Normocephalic and atraumatic.     Right Ear: External ear normal.     Left Ear: External ear normal.     Nose: Nose normal.     Mouth/Throat:     Mouth: Mucous membranes are moist.  Eyes:     Extraocular Movements: Extraocular movements intact.  Cardiovascular:     Rate and Rhythm: Normal rate.     Pulses: Normal pulses.  Pulmonary:     Effort: Pulmonary effort is normal.  Abdominal:     General: Abdomen is flat. There is no distension.  Musculoskeletal:        General: Tenderness present.     Cervical back: Normal range of motion.     Comments: Pt  is slow to rise from seated position to standing. Concordant low back pain with facet loading, lumbar spine extension and rotation. Strong distal strength without clonus, no pain upon palpation of greater trochanters. Sensation intact bilaterally. Walks independently, gait steady.   Skin:    General: Skin is warm and dry.     Capillary Refill: Capillary refill takes less than 2 seconds.  Neurological:     Mental Status: She is alert and oriented to person, place, and time.     Gait: Gait abnormal.  Psychiatric:        Mood and Affect: Mood normal.        Behavior: Behavior normal.    Ortho Exam  Imaging: No results  found.  Past Medical/Family/Surgical/Social History: Medications & Allergies reviewed per EMR, new medications updated. Patient Active Problem List   Diagnosis Date Noted   Unilateral primary osteoarthritis, right knee 03/27/2021   Unilateral primary osteoarthritis, left knee 03/27/2021   Acute right-sided low back pain without sciatica    Peripheral neuropathy 02/08/2018   Acute pancreatitis 02/07/2018   CAD S/P percutaneous coronary angioplasty 01/29/2017   Tobacco abuse 01/29/2017   Chronic back pain 01/29/2017   Hypoglycemia 12/30/2016   Insulin dependent diabetes mellitus 11/21/2015   Dyslipidemia 11/21/2015   NSTEMI (non-ST elevated myocardial infarction) [facility])    TIA (transient ischemic attack) 05/31/2014   History of stroke 04/22/2012   Hypokalemia 04/22/2012   Diabetes mellitus ([facility]) 04/22/2012   Essential hypertension 04/22/2012   Past Medical History:  Diagnosis Date   Acid reflux    Anxiety    Asthma    Degenerative disc disease    Depression    Diabetes mellitus    Hypertension    Hypoglycemia 12/30/2016   Stroke ([facility])    04/22/12   Family History  Problem Relation Age of Onset   Hypertension Other    Past Surgical History:  Procedure Laterality Date   CARDIAC CATHETERIZATION N/A 11/21/2015   Procedure: Left Heart Cath and Coronary Angiography;  Surgeon: Peter M Swaziland, MD;  Location: [facility]VAS[facility];  Service: Cardiovascular;  Laterality: N/A;   CARDIAC CATHETERIZATION N/A 11/21/2015   Procedure: Coronary Stent Intervention;  Surgeon: Peter M Swaziland, MD;  Location: [facility] INVASIVE CV LAB;  Service: Cardiovascular;  Laterality: N/A;   KNEE SURGERY     TUBAL LIGATION     Social History   Occupational History   Not on file  Tobacco Use   Smoking status: Every Day    Packs/day: 0.25    Years: 45.00    Pack years: 11.25    Types: Cigarettes   Smokeless tobacco: Never  Vaping Use   Vaping Use: Never used  Substance and Sexual Activity   Alcohol use: No    Drug use: No   Sexual activity: Never

## 2021-06-13 ENCOUNTER — Other Ambulatory Visit: Payer: Self-pay | Admitting: Nurse Practitioner

## 2021-06-13 DIAGNOSIS — Z1231 Encounter for screening mammogram for malignant neoplasm of breast: Secondary | ICD-10-CM

## 2021-06-24 ENCOUNTER — Other Ambulatory Visit: Payer: Self-pay | Admitting: Cardiovascular Disease

## 2021-07-08 ENCOUNTER — Telehealth: Payer: Self-pay | Admitting: Physical Medicine and Rehabilitation

## 2021-07-08 NOTE — Telephone Encounter (Signed)
Pt called requesting a call back to set an appt for a referral back injections. Please call pt at [phone] ?

## 2021-07-11 NOTE — Progress Notes (Deleted)
?Cardiology Clinic Note  ? ?Patient Name: Miranda [name] ?Date of Encounter: 07/11/2021 ? ?Primary Care Provider:  [name], MD ?Primary Cardiologist:  Nanetta Batty, MD Milagros Loll, [place], [place] ? ?Patient Profile  ?  ?66 year old female with history of CAD s/p PCI of the Cx by Dr. Swaziland in 2017, noncritical CAD with a 60% mid to distal LAD stenosis and left dominant system with normal LV function; dyslipidemia, hypertension, and ongoing tobacco abuse. Last seen in the office 03/08/2019, on DAPT.  ? ?Past Medical History  ?  ?Past Medical History:  ?Diagnosis Date  ? Acid reflux   ? Anxiety   ? Asthma   ? Degenerative disc disease   ? Depression   ? Diabetes mellitus   ? Hypertension   ? Hypoglycemia 12/30/2016  ? Stroke [facility])   ? 04/22/12  ? ?Past Surgical History:  ?Procedure Laterality Date  ? CARDIAC CATHETERIZATION N/A 11/21/2015  ? Procedure: Left Heart Cath and Coronary Angiography;  Surgeon: Peter M Swaziland, MD;  Location: [facility] INVASIVE CV LAB;  Service: Cardiovascular;  Laterality: N/A;  ? CARDIAC CATHETERIZATION N/A 11/21/2015  ? Procedure: Coronary Stent Intervention;  Surgeon: Peter M Swaziland, MD;  Location: [facility] INVASIVE CV LAB;  Service: Cardiovascular;  Laterality: N/A;  ? KNEE SURGERY    ? TUBAL LIGATION    ? ? ?Allergies ? ?No Known Allergies ? ?History of Present Illness  ?  ?Mr. Macbride presents today for ongoing assessment and management of CAD, s/p stenting of the L Cx, hyperlipidemia, and hypertension. Last seen in 2020.  ? ?Home Medications  ?  ?Current Outpatient Medications  ?Medication Sig Dispense Refill  ? ACCU-CHEK AVIVA PLUS test strip 1 each as needed.  5  ? albuterol (PROVENTIL HFA;VENTOLIN HFA) 108 (90 Base) MCG/ACT inhaler Inhale 2 puffs into the lungs every 6 (six) hours as needed for wheezing or shortness of breath.    ? amLODipine (NORVASC) 5 MG tablet Take 5 mg by mouth daily.    ? aspirin 81 MG chewable tablet Chew 1 tablet (81 mg total) by mouth daily.    ? atorvastatin (LIPITOR)  80 MG tablet TAKE 1 TABLET EVERY DAY AT 6 PM 15 tablet 0  ? B-D ULTRAFINE III SHORT PEN 31G X 8 MM MISC 1 each as needed.  11  ? carvedilol (COREG) 12.5 MG tablet TAKE 1 TABLET (12.5 MG TOTAL) BY MOUTH 2 (TWO) TIMES DAILY WITH A MEAL. 60 tablet 10  ? cloNIDine (CATAPRES) 0.1 MG tablet Take 0.1 mg by mouth daily as needed (sleep).     ? clopidogrel (PLAVIX) 75 MG tablet TAKE 1 TABLET (75 MG TOTAL) BY MOUTH DAILY. NEEDS APPOINTMENT WITH DR Allyson Sabal FOR FUTURE REFILLS 15 tablet 0  ? Cyanocobalamin (B-12) 1000 MCG CAPS Take 1,000 mcg by mouth daily.     ? diclofenac sodium (VOLTAREN) 1 % GEL Apply 4 g topically 4 (four) times daily. 1 Tube 0  ? diclofenac Sodium (VOLTAREN) 1 % GEL Apply 1 application topically 4 (four) times daily as needed for pain.    ? furosemide (LASIX) 20 MG tablet Take 1 tablet (20 mg total) by mouth daily. 3 tablet 0  ? gabapentin (NEURONTIN) 300 MG capsule Take 300 mg by mouth 3 (three) times daily.     ? Glycerin, Adult, 2.1 g SUPP Place 1 suppository rectally daily as needed for moderate constipation. 20 suppository 0  ? LANTUS SOLOSTAR 100 UNIT/ML Solostar Pen Inject 50 Units into the skin in  the morning and at bedtime.    ? LORazepam (ATIVAN) 0.5 MG tablet Take 1 tablet (0.5 mg total) by mouth 2 (two) times daily as needed for anxiety. 5 tablet 0  ? losartan (COZAAR) 25 MG tablet Take 25 mg by mouth daily.    ? metFORMIN (GLUCOPHAGE) 1000 MG tablet Take 1,000 mg by mouth 2 (two) times daily.    ? metFORMIN (GLUMETZA) 1000 MG (MOD) 24 hr tablet Take 1 tablet (1,000 mg total) by mouth 2 (two) times daily with a meal. Restart tomorrow    ? MOVANTIK 25 MG TABS tablet Take 25 mg by mouth daily before breakfast.   5  ? nicotine (NICODERM CQ - DOSED IN MG/24 HR) 7 mg/24hr patch Place 1 patch (7 mg total) onto the skin daily. 14 patch 0  ? ondansetron (ZOFRAN ODT) 4 MG disintegrating tablet Take 1 tablet (4 mg total) by mouth every 6 (six) hours as needed. 20 tablet 0  ? oxyCODONE-acetaminophen  (PERCOCET) 7.5-325 MG tablet Take 1 tablet by mouth every 4 (four) hours as needed for severe pain.    ? polyethylene glycol (MIRALAX / GLYCOLAX) packet Take 17 g by mouth daily as needed. 14 each 0  ? potassium chloride (KLOR-CON) 10 MEQ tablet Take 1 tablet (10 mEq total) by mouth daily. 3 tablet 0  ? traZODone (DESYREL) 50 MG tablet Take 50 mg by mouth at bedtime as needed for sleep.     ? Vitamin D, Ergocalciferol, (DRISDOL) 50000 units CAPS capsule Take 50,000 Units by mouth every 7 (seven) days. monday    ? ?No current facility-administered medications for this visit.  ?  ? ?Family History  ?  ?Family History  ?Problem Relation Age of Onset  ? Hypertension Other   ? ?She indicated that her mother is deceased. She indicated that her father is deceased. She indicated that the status of her other is unknown. ? ?Social History  ?  ?Social History  ? ?Socioeconomic History  ? Marital status: Divorced  ?  Spouse name: Not on file  ? Number of children: Not on file  ? Years of education: Not on file  ? Highest education level: Not on file  ?Occupational History  ? Not on file  ?Tobacco Use  ? Smoking status: Every Day  ?  Packs/day: 0.25  ?  Years: 45.00  ?  Pack years: 11.25  ?  Types: Cigarettes  ? Smokeless tobacco: Never  ?Vaping Use  ? Vaping Use: Never used  ?Substance and Sexual Activity  ? Alcohol use: No  ? Drug use: No  ? Sexual activity: Never  ?Other Topics Concern  ? Not on file  ?Social History Narrative  ? Not on file  ? ?Social Determinants of Health  ? ?Financial Resource Strain: Not on file  ?Food Insecurity: Not on file  ?Transportation Needs: Not on file  ?Physical Activity: Not on file  ?Stress: Not on file  ?Social Connections: Not on file  ?Intimate Partner Violence: Not on file  ?  ? ?Review of Systems  ?  ?General:  No chills, fever, night sweats or weight changes.  ?Cardiovascular:  No chest pain, dyspnea on exertion, edema, orthopnea, palpitations, paroxysmal nocturnal  dyspnea. ?Dermatological: No rash, lesions/masses ?Respiratory: No cough, dyspnea ?Urologic: No hematuria, dysuria ?Abdominal:   No nausea, vomiting, diarrhea, bright red blood per rectum, melena, or hematemesis ?Neurologic:  No visual changes, wkns, changes in mental status. ?All other systems reviewed and are otherwise negative except as  noted above. ? ?  ? ?Physical Exam  ?  ?VS:  There were no vitals taken for this visit. , BMI There is no height or weight on file to calculate BMI. ?    ?GEN: Well nourished, well developed, in no acute distress. ?HEENT: normal. ?Neck: Supple, no JVD, carotid bruits, or masses. ?Cardiac: RRR, no murmurs, rubs, or gallops. No clubbing, cyanosis, edema.  Radials/DP/PT 2+ and equal bilaterally.  ?Respiratory:  Respirations regular and unlabored, clear to auscultation bilaterally. ?GI: Soft, nontender, nondistended, BS + x 4. ?MS: no deformity or atrophy. ?Skin: warm and dry, no rash. ?Neuro:  Strength and sensation are intact. ?Psych: Normal affect. ? ?Accessory Clinical Findings  ?  ?ECG personally reviewed by me today- *** - No acute changes ? ?Lab Results  ?Component Value Date  ? WBC 6.9 09/05/2019  ? HGB 12.0 09/05/2019  ? HCT 40.3 09/05/2019  ? MCV 93.1 09/05/2019  ? PLT 255 09/05/2019  ? ?Lab Results  ?Component Value Date  ? CREATININE 0.70 09/05/2019  ? BUN 9 09/05/2019  ? NA 143 09/05/2019  ? K 3.4 (L) 09/05/2019  ? CL 106 09/05/2019  ? CO2 29 09/05/2019  ? ?Lab Results  ?Component Value Date  ? ALT 8 03/08/2019  ? AST 10 03/08/2019  ? ALKPHOS 165 (H) 03/08/2019  ? BILITOT 0.4 03/08/2019  ? ?Lab Results  ?Component Value Date  ? CHOL 162 03/08/2019  ? HDL 38 (L) 03/08/2019  ? LDLCALC 105 (H) 03/08/2019  ? TRIG 104 03/08/2019  ? CHOLHDL 4.3 03/08/2019  ?  ?Lab Results  ?Component Value Date  ? HGBA1C 9.1 (H) 12/30/2016  ? ? ?Review of Prior Studies: ?Echocardiogram 11/21/2015 ?Left ventricle: The cavity size was normal. There was moderate  ?  concentric hypertrophy. Systolic  function was vigorous. The  ?  estimated ejection fraction was in the range of 65% to 70%. There  ?  was dynamic obstruction at restin the mid cavity, with a peak  ?  velocity of 238 cm/sec and a peak gradient of 23 mm Hg. Wall

## 2021-07-12 ENCOUNTER — Ambulatory Visit: Payer: Medicaid Other | Admitting: Adult Health

## 2021-07-17 ENCOUNTER — Other Ambulatory Visit: Payer: Self-pay | Admitting: Internal Medicine

## 2021-07-18 LAB — BASIC METABOLIC PANEL WITH GFR
BUN: 12 mg/dL (ref 7–25)
CO2: 30 mmol/L (ref 20–32)
Calcium: 9.4 mg/dL (ref 8.6–10.4)
Chloride: 105 mmol/L (ref 98–110)
Creat: 0.84 mg/dL (ref 0.50–1.05)
Glucose, Bld: 116 mg/dL — ABNORMAL HIGH (ref 65–99)
Potassium: 3.8 mmol/L (ref 3.5–5.3)
Sodium: 143 mmol/L (ref 135–146)
eGFR: 78 mL/min/{1.73_m2} (ref >=60)

## 2021-07-18 LAB — VITAMIN D 25 HYDROXY (VIT D DEFICIENCY, FRACTURES): Vit D, 25-Hydroxy: 80 ng/mL (ref 30–100)

## 2021-07-18 LAB — LIPID PANEL
Cholesterol: 138 mg/dL (ref <200)
HDL: 39 mg/dL — ABNORMAL LOW (ref >=50)
LDL Cholesterol (Calc): 82 mg/dL (calc)
Non-HDL Cholesterol (Calc): 99 mg/dL (calc) (ref <130)
Total CHOL/HDL Ratio: 3.5 (calc) (ref <5.0)
Triglycerides: 90 mg/dL (ref <150)

## 2021-07-18 LAB — VITAMIN B12: Vitamin B-12: 578 pg/mL (ref 200–1100)

## 2021-07-18 LAB — TSH: TSH: 2.86 mIU/L (ref 0.40–4.50)

## 2021-07-18 LAB — EXTRA LAV TOP TUBE

## 2021-07-18 LAB — FOLATE: Folate: 15.9 ng/mL

## 2021-07-23 ENCOUNTER — Telehealth: Payer: Self-pay | Admitting: Physical Medicine and Rehabilitation

## 2021-07-23 NOTE — Telephone Encounter (Signed)
Pt returned call to [facility] about a referral. Please call pt at [phone]. ?

## 2021-07-30 NOTE — Progress Notes (Signed)
?Cardiology Clinic Note  ? ?Patient Name: Miranda [name] ?Date of Encounter: 08/02/2021 ? ?Primary Care Provider:  [name], MD ?Primary Cardiologist:  Nanetta Batty, MD ? ?Patient Profile  ?  ?66 year old female with history of hypertension, type 2 diabetes, hyperlipidemia, ongoing tobacco abuse, CVA, CAD with cardiac catheterization on 11/21/2015 revealing 90% proximal AV groove circumflex, 60% distal LAD lesion, status post DES to the circumflex.  Other history includes GERD, anxiety and depression.  Last seen in the office by Dr. Allyson Sabal on 03/08/2019.  At that time no changes in her medication regimen were made.  She was encouraged to quit smoking. ? ?Past Medical History  ?  ?Past Medical History:  ?Diagnosis Date  ? Acid reflux   ? Anxiety   ? Asthma   ? Degenerative disc disease   ? Depression   ? Diabetes mellitus   ? Hypertension   ? Hypoglycemia 12/30/2016  ? Stroke [facility])   ? 04/22/12  ? ?Past Surgical History:  ?Procedure Laterality Date  ? CARDIAC CATHETERIZATION N/A 11/21/2015  ? Procedure: Left Heart Cath and Coronary Angiography;  Surgeon: Peter M Swaziland, MD;  Location: [facility] INVASIVE CV LAB;  Service: Cardiovascular;  Laterality: N/A;  ? CARDIAC CATHETERIZATION N/A 11/21/2015  ? Procedure: Coronary Stent Intervention;  Surgeon: Peter M Swaziland, MD;  Location: [facility] INVASIVE CV LAB;  Service: Cardiovascular;  Laterality: N/A;  ? KNEE SURGERY    ? TUBAL LIGATION    ? ? ?Allergies ? ?No Known Allergies ? ?History of Present Illness  ?  ?Mrs. Miranda [name] is a 66 year old female we are following for ongoing assessment and management of coronary artery disease, hypertension, with history of tobacco abuse.  She has not been seen in the office since 2020. ? ?She comes today with complaints of dyspnea on exertion, pain in her legs, numbness and tingling in her hands and feet.  She states that this has been progressive over the last year was advised by PCP to follow-up.  She has stopped taking her Plavix since being seen  last, and is no longer on losartan 25 mg daily.  She continues to smoke 1/2 to 1 pack a day.  She also admits to eating salted foods although she does not add salt to her foods.  She is not very active due to pain in her legs and uses a cane for ambulation. ? ?Home Medications  ?  ?Current Outpatient Medications  ?Medication Sig Dispense Refill  ? ACCU-CHEK AVIVA PLUS test strip 1 each as needed.  5  ? albuterol (PROVENTIL HFA;VENTOLIN HFA) 108 (90 Base) MCG/ACT inhaler Inhale 2 puffs into the lungs every 6 (six) hours as needed for wheezing or shortness of breath.    ? amLODipine (NORVASC) 5 MG tablet Take 5 mg by mouth daily.    ? aspirin 81 MG chewable tablet Chew 1 tablet (81 mg total) by mouth daily.    ? atorvastatin (LIPITOR) 80 MG tablet TAKE 1 TABLET EVERY DAY AT 6 PM 15 tablet 0  ? B-D ULTRAFINE III SHORT PEN 31G X 8 MM MISC 1 each as needed.  11  ? carvedilol (COREG) 12.5 MG tablet TAKE 1 TABLET (12.5 MG TOTAL) BY MOUTH 2 (TWO) TIMES DAILY WITH A MEAL. 60 tablet 10  ? cloNIDine (CATAPRES) 0.1 MG tablet Take 0.1 mg by mouth daily as needed (sleep).     ? Cyanocobalamin (B-12) 1000 MCG CAPS Take 1,000 mcg by mouth daily.     ? diclofenac sodium (  VOLTAREN) 1 % GEL Apply 4 g topically 4 (four) times daily. 1 Tube 0  ? diclofenac Sodium (VOLTAREN) 1 % GEL Apply 1 application topically 4 (four) times daily as needed for pain.    ? furosemide (LASIX) 20 MG tablet Take 1 tablet (20 mg total) by mouth daily. 3 tablet 0  ? gabapentin (NEURONTIN) 300 MG capsule Take 300 mg by mouth 3 (three) times daily.     ? Glycerin, Adult, 2.1 g SUPP Place 1 suppository rectally daily as needed for moderate constipation. 20 suppository 0  ? LANTUS SOLOSTAR 100 UNIT/ML Solostar Pen Inject 50 Units into the skin in the morning and at bedtime.    ? LORazepam (ATIVAN) 0.5 MG tablet Take 1 tablet (0.5 mg total) by mouth 2 (two) times daily as needed for anxiety. 5 tablet 0  ? metFORMIN (GLUCOPHAGE) 1000 MG tablet Take 1,000 mg by  mouth 2 (two) times daily.    ? metFORMIN (GLUMETZA) 1000 MG (MOD) 24 hr tablet Take 1 tablet (1,000 mg total) by mouth 2 (two) times daily with a meal. Restart tomorrow    ? MOVANTIK 25 MG TABS tablet Take 25 mg by mouth daily before breakfast.   5  ? nicotine (NICODERM CQ - DOSED IN MG/24 HR) 7 mg/24hr patch Place 1 patch (7 mg total) onto the skin daily. 14 patch 0  ? ondansetron (ZOFRAN ODT) 4 MG disintegrating tablet Take 1 tablet (4 mg total) by mouth every 6 (six) hours as needed. 20 tablet 0  ? oxyCODONE-acetaminophen (PERCOCET) 7.5-325 MG tablet Take 1 tablet by mouth every 4 (four) hours as needed for severe pain.    ? polyethylene glycol (MIRALAX / GLYCOLAX) packet Take 17 g by mouth daily as needed. 14 each 0  ? potassium chloride (KLOR-CON) 10 MEQ tablet Take 1 tablet (10 mEq total) by mouth daily. 3 tablet 0  ? traZODone (DESYREL) 50 MG tablet Take 50 mg by mouth at bedtime as needed for sleep.     ? Vitamin D, Ergocalciferol, (DRISDOL) 50000 units CAPS capsule Take 50,000 Units by mouth every 7 (seven) days. monday    ? ?No current facility-administered medications for this visit.  ?  ? ?Family History  ?  ?Family History  ?Problem Relation Age of Onset  ? Hypertension Other   ? ?She indicated that her mother is deceased. She indicated that her father is deceased. She indicated that the status of her other is unknown. ? ?Social History  ?  ?Social History  ? ?Socioeconomic History  ? Marital status: Divorced  ?  Spouse name: Not on file  ? Number of children: Not on file  ? Years of education: Not on file  ? Highest education level: Not on file  ?Occupational History  ? Not on file  ?Tobacco Use  ? Smoking status: Every Day  ?  Packs/day: 0.25  ?  Years: 45.00  ?  Pack years: 11.25  ?  Types: Cigarettes  ? Smokeless tobacco: Never  ?Vaping Use  ? Vaping Use: Never used  ?Substance and Sexual Activity  ? Alcohol use: No  ? Drug use: No  ? Sexual activity: Never  ?Other Topics Concern  ? Not on file   ?Social History Narrative  ? Not on file  ? ?Social Determinants of Health  ? ?Financial Resource Strain: Not on file  ?Food Insecurity: Not on file  ?Transportation Needs: Not on file  ?Physical Activity: Not on file  ?Stress: Not on file  ?  Social Connections: Not on file  ?Intimate Partner Violence: Not on file  ?  ? ?Review of Systems  ?  ?General:  No chills, fever, night sweats or weight changes.  ?Cardiovascular:  No chest pain, positive for dyspnea on exertion, intermittent lower extremity edema, orthopnea, palpitations, paroxysmal nocturnal dyspnea. ?Dermatological: No rash, lesions/masses ?Respiratory: No cough, increasing dyspnea ?Urologic: No hematuria, dysuria ?Abdominal:   No nausea, vomiting, diarrhea, bright red blood per rectum, melena, or hematemesis ?Neurologic:  No visual changes, wkns, changes in mental status.  Numbness and tingling in feet and hands ?All other systems reviewed and are otherwise negative except as noted above. ? ? ?Physical Exam  ?  ?VS:  BP (!) 142/84   Pulse 70   Ht 5\' 10"  (1.778 m)   Wt 224 lb (101.6 kg)   SpO2 96%   BMI 32.14 kg/m?  , BMI Body mass index is 32.14 kg/m?. ?    ?GEN: Well nourished, well developed, in no acute distress, obese ?HEENT: normal. ?Neck: Supple, no JVD, carotid bruits, or masses. ?Cardiac: RRR, no murmurs, rubs, or gallops. No clubbing, cyanosis, edema.  Radials/DP/PT 1+ and equal bilaterally.  ?Respiratory:  Respirations regular and unlabored, clear to auscultation bilaterally. ?GI: Soft, nontender, nondistended, BS + x 4. ?MS: no deformity or atrophy. ?Skin: warm and dry, no rash. ?Neuro:  Strength and sensation are intact. ?Psych: Flat affect.  Answers questions appropriately ? ?Accessory Clinical Findings  ?  ?ECG personally reviewed by me today-sinus rhythm, LVH noted lead I, T wave flattening noted in the anterior lateral leads V4 through V6, T wave flattening also noted inferiorly.  Heart rate of 70 bpm- New changes inferiorly.  ? ?Lab  Results  ?Component Value Date  ? WBC 6.9 09/05/2019  ? HGB 12.0 09/05/2019  ? HCT 40.3 09/05/2019  ? MCV 93.1 09/05/2019  ? PLT 255 09/05/2019  ? ?Lab Results  ?Component Value Date  ? CREATININE 0.84 07/17/2021

## 2021-08-02 ENCOUNTER — Ambulatory Visit (INDEPENDENT_AMBULATORY_CARE_PROVIDER_SITE_OTHER): Payer: Medicaid Other | Admitting: Adult Health

## 2021-08-02 ENCOUNTER — Encounter: Payer: Self-pay | Admitting: Adult Health

## 2021-08-02 VITALS — BP 142/84 | HR 70 | Ht 70.0 in | Wt 224.0 lb

## 2021-08-02 DIAGNOSIS — E1140 Type 2 diabetes mellitus with diabetic neuropathy, unspecified: Secondary | ICD-10-CM

## 2021-08-02 DIAGNOSIS — Z794 Long term (current) use of insulin: Secondary | ICD-10-CM

## 2021-08-02 DIAGNOSIS — E7800 Pure hypercholesterolemia, unspecified: Secondary | ICD-10-CM | POA: Diagnosis not present

## 2021-08-02 DIAGNOSIS — I2510 Atherosclerotic heart disease of native coronary artery without angina pectoris: Secondary | ICD-10-CM

## 2021-08-02 DIAGNOSIS — I10 Essential (primary) hypertension: Secondary | ICD-10-CM

## 2021-08-02 DIAGNOSIS — Z8673 Personal history of transient ischemic attack (TIA), and cerebral infarction without residual deficits: Secondary | ICD-10-CM

## 2021-08-02 DIAGNOSIS — Z720 Tobacco use: Secondary | ICD-10-CM

## 2021-08-02 DIAGNOSIS — R0602 Shortness of breath: Secondary | ICD-10-CM

## 2021-08-02 DIAGNOSIS — Z9861 Coronary angioplasty status: Secondary | ICD-10-CM

## 2021-08-02 NOTE — Patient Instructions (Signed)
Medication Instructions:  ?No changes ?*If you need a refill on your cardiac medications before your next appointment, please call your pharmacy* ? ? ?Lab Work: ?No labs ?If you have labs (blood work) drawn today and your tests are completely normal, you will receive your results only by: ?MyChart Message (if you have MyChart) OR ?A paper copy in the mail ?If you have any lab test that is abnormal or we need to change your treatment, we will call you to review the results. ? ? ?Testing/Procedures: [address], [address]. ?Your physician has requested that you have a lexiscan myoview. For further information please visit [email or web address]. Please follow instruction sheet, as given.  ? ?[address]. ?Your physician has requested that you have an echocardiogram. Echocardiography is a painless test that uses sound waves to create images of your heart. It provides your doctor with information about the size and shape of your heart and how well your heart?s chambers and valves are working. This procedure takes approximately one hour. There are no restrictions for this procedure.  ? ?Follow-Up: ?At [facility], you and your health needs are our priority.  As part of our continuing mission to provide you with exceptional heart care, we have created designated Provider Care Teams.  These Care Teams include your primary Cardiologist (physician) and Advanced Practice Providers (APPs -  Physician Assistants and Nurse Practitioners) who all work together to provide you with the care you need, when you need it. ? ?We recommend signing up for the patient portal called "MyChart".  Sign up information is provided on this After Visit Summary.  MyChart is used to connect with patients for Virtual Visits (Telemedicine).  Patients are able to view lab/test results, encounter notes, upcoming appointments, etc.  Non-urgent messages can be sent to your provider as well.   ?To learn more about what  you can do with MyChart, go to [email or web address].   ? ?Your next appointment:   ?1 month(s) ? ?The format for your next appointment:   ?In Person ? ?Provider:   ?Jory Sims, DNP, ANP  or Quay Burow, MD  ? ?If primary card or EP is not listed click here to update    :1}  ? ? ? ? ?Important Information About Sugar ? ? ? ? ?  ?

## 2021-08-07 ENCOUNTER — Telehealth: Payer: Self-pay

## 2021-08-07 NOTE — Telephone Encounter (Signed)
Detailed instructions left on the patient's answering machine. Asked to call back with any questions. S.Zayde Stroupe EMTP 

## 2021-08-12 ENCOUNTER — Other Ambulatory Visit (HOSPITAL_COMMUNITY): Payer: Medicaid Other

## 2021-08-13 ENCOUNTER — Ambulatory Visit (HOSPITAL_COMMUNITY): Payer: Medicaid Other | Attending: Adult Health

## 2021-08-13 ENCOUNTER — Ambulatory Visit (HOSPITAL_COMMUNITY): Payer: Medicaid Other

## 2021-08-13 ENCOUNTER — Encounter (HOSPITAL_COMMUNITY): Payer: Self-pay

## 2021-08-13 ENCOUNTER — Other Ambulatory Visit (HOSPITAL_COMMUNITY): Payer: Self-pay | Admitting: Adult Health

## 2021-08-13 DIAGNOSIS — R0602 Shortness of breath: Secondary | ICD-10-CM | POA: Insufficient documentation

## 2021-08-13 LAB — MYOCARDIAL PERFUSION IMAGING
LV dias vol: 73 mL (ref 46–106)
LV sys vol: 24 mL
Nuc Stress EF: 67 %
Peak HR: 96 {beats}/min
Rest HR: 75 {beats}/min
Rest Nuclear Isotope Dose: 10.6 mCi
SDS: 2
SRS: 0
SSS: 2
ST Depression (mm): 0 mm
Stress Nuclear Isotope Dose: 30.7 mCi
TID: 1.06

## 2021-08-13 MED ORDER — TECHNETIUM TC 99M TETROFOSMIN IV KIT
10.6000 | PACK | Freq: Once | INTRAVENOUS | Status: AC | PRN
  Administered 2021-08-13: 10.6 via INTRAVENOUS
  Filled 2021-08-13: qty 11

## 2021-08-13 MED ORDER — REGADENOSON 0.4 MG/5ML IV SOLN
0.4000 mg | Freq: Once | INTRAVENOUS | Status: AC
  Administered 2021-08-13: 0.4 mg via INTRAVENOUS

## 2021-08-13 MED ORDER — TECHNETIUM TC 99M TETROFOSMIN IV KIT
30.7000 | PACK | Freq: Once | INTRAVENOUS | Status: AC | PRN
  Administered 2021-08-13: 30.7 via INTRAVENOUS
  Filled 2021-08-13: qty 31

## 2021-08-15 ENCOUNTER — Ambulatory Visit: Payer: Self-pay

## 2021-08-15 ENCOUNTER — Ambulatory Visit: Payer: Medicaid Other | Admitting: Physical Medicine and Rehabilitation

## 2021-08-15 ENCOUNTER — Encounter: Payer: Self-pay | Admitting: Physical Medicine and Rehabilitation

## 2021-08-15 ENCOUNTER — Telehealth: Payer: Self-pay

## 2021-08-15 VITALS — BP 126/73 | HR 79

## 2021-08-15 DIAGNOSIS — M47816 Spondylosis without myelopathy or radiculopathy, lumbar region: Secondary | ICD-10-CM | POA: Diagnosis not present

## 2021-08-15 MED ORDER — BUPIVACAINE HCL 0.5 % IJ SOLN
3.0000 mL | Freq: Once | INTRAMUSCULAR | Status: AC
  Administered 2021-08-15: 3 mL

## 2021-08-15 NOTE — Progress Notes (Signed)
Left detailed message regarding results for patient

## 2021-08-15 NOTE — Progress Notes (Signed)
Pt state lower back pain that travels down both knee and foot. Pt state walking, standing and laying down makes the pain worse. Pt state she takes pain meds to help ease her pain.  Numeric Pain Rating Scale and Functional Assessment Average Pain 8   In the last MONTH (on 0-10 scale) has pain interfered with the following?  1. General activity like being  able to carry out your everyday physical activities such as walking, climbing stairs, carrying groceries, or moving a chair?  Rating(9)   +Driver, -BT, -Dye Allergies.

## 2021-08-15 NOTE — Patient Instructions (Signed)

## 2021-08-15 NOTE — Telephone Encounter (Addendum)
Called patient regarding results. Left detailed message for patient regarding results----- Message from Lendon Colonel, NP sent at 08/15/2021  8:12 AM EDT ----- ?Normal stress test! Great news. No changes in regime  ? ?KL ?

## 2021-08-16 NOTE — Telephone Encounter (Signed)
Patient called to update her numbers please call back with results info. Please advise  ?

## 2021-08-16 NOTE — Telephone Encounter (Signed)
Called patient, gave results.  ? ?Patient verbalized understanding. ?Thanks! ? ?Routing to CMA for FYI. ? ?

## 2021-08-19 ENCOUNTER — Telehealth: Payer: Self-pay | Admitting: Physical Medicine and Rehabilitation

## 2021-08-19 ENCOUNTER — Other Ambulatory Visit: Payer: Self-pay | Admitting: Physical Medicine and Rehabilitation

## 2021-08-19 NOTE — Telephone Encounter (Signed)
Patient called advised she is in a lot of pain. Patient said she has a procedure on Friday. Patient said she is back in bed due to the pain she is in. The number to contact patient is [phone] ?

## 2021-08-21 ENCOUNTER — Encounter (HOSPITAL_COMMUNITY): Payer: Self-pay | Admitting: Radiology

## 2021-08-22 ENCOUNTER — Telehealth: Payer: Self-pay

## 2021-08-22 NOTE — Telephone Encounter (Signed)
Spoke with pt. Pt was notified of stress test results and will continue her current medications. ?

## 2021-09-04 NOTE — Progress Notes (Signed)
Miranda [name] - 66 y.o. female MRN [id number]  Date of birth: 12-01-1955  Office Visit Note: Visit Date: 08/15/2021 PCP: Nolene Ebbs, MD Referred by: Nolene Ebbs, MD  Subjective: Chief Complaint  Patient presents with   Lower Back - Pain   Right Knee - Pain   Left Knee - Pain   Left Foot - Pain   Right Foot - Pain   HPI:  Miranda [name] is a 66 y.o. female who comes in today at the request of Barnet Pall, FNP for planned Bilateral  L5-S1 Lumbar facet/medial branch block with fluoroscopic guidance.  The patient has failed conservative care including home exercise, medications, time and activity modification.  This injection will be diagnostic and hopefully therapeutic.  Please see requesting physician notes for further details and justification.  Exam has shown concordant pain with facet joint loading.  ROS Otherwise per HPI.  Assessment & Plan: Visit Diagnoses:    ICD-10-CM   1. Spondylosis without myelopathy or radiculopathy, lumbar region  M47.816 XR C-ARM NO REPORT    Facet Injection    bupivacaine (MARCAINE) 0.5 % (with pres) injection 3 mL      Plan: No additional findings.   Meds & Orders:  Meds ordered this encounter  Medications   bupivacaine (MARCAINE) 0.5 % (with pres) injection 3 mL    Orders Placed This Encounter  Procedures   Facet Injection   XR C-ARM NO REPORT    Follow-up: Return for Review Pain Diary.   Procedures: No procedures performed  Lumbar Diagnostic Facet Joint Nerve Block with Fluoroscopic Guidance   Patient: Miranda [name]      Date of Birth: 05/12/1955 MRN: [id number] PCP: Nolene Ebbs, MD      Visit Date: 08/15/2021   Universal Protocol:    Date/Time: 05/24/235:49 AM  Consent Given By: the patient  Position: PRONE  Additional Comments: Vital signs were monitored before and after the procedure. Patient was prepped and draped in the usual sterile fashion. The correct patient, procedure, and site was  verified.   Injection Procedure Details:   Procedure diagnoses:  1. Spondylosis without myelopathy or radiculopathy, lumbar region      Meds Administered:  Meds ordered this encounter  Medications   bupivacaine (MARCAINE) 0.5 % (with pres) injection 3 mL     Laterality: Bilateral  Location/Site: L5-S1, L4 medial branch and L5 dorsal ramus  Needle: 5.0 in., 25 ga.  Short bevel or Quincke spinal needle  Needle Placement: Oblique pedical  Findings:   -Comments: There was excellent flow of contrast along the articular pillars without intravascular flow.  Procedure Details: The fluoroscope beam is vertically oriented in AP and then obliqued 15 to 20 degrees to the ipsilateral side of the desired nerve to achieve the "Scotty dog" appearance.  The skin over the target area of the junction of the superior articulating process and the transverse process (sacral ala if blocking the L5 dorsal rami) was locally anesthetized with a 1 ml volume of 1% Lidocaine without Epinephrine.  The spinal needle was inserted and advanced in a trajectory view down to the target.   After contact with periosteum and negative aspirate for blood and CSF, correct placement without intravascular or epidural spread was confirmed by injecting 0.5 ml. of Isovue-250.  A spot radiograph was obtained of this image.    Next, a 0.5 ml. volume of the injectate described above was injected. The needle was then redirected to the other facet joint nerves mentioned above  if needed.  Prior to the procedure, the patient was given a Pain Diary which was completed for baseline measurements.  After the procedure, the patient rated their pain every 30 minutes and will continue rating at this frequency for a total of 5 hours.  The patient has been asked to complete the Diary and return to [place] by mail, fax or hand delivered as soon as possible.   Additional Comments:  The patient tolerated the procedure well Dressing: 2 x 2 sterile  gauze and Band-Aid    Post-procedure details: Patient was observed during the procedure. Post-procedure instructions were reviewed.  Patient left the clinic in stable condition.    Clinical History: MRI LUMBAR SPINE WITHOUT CONTRAST     TECHNIQUE:  Multiplanar, multisequence MR imaging of the lumbar spine was  performed. No intravenous contrast was administered.     COMPARISON:  Lumbar radiograph 10/11/2014.  Lumbar MRI 04/12/2008.     FINDINGS:  Segmentation: Normal lumbar segmentation demonstrated on the  radiographs which is the same numbering system used on the 07/23/07 MRI.     Alignment:  Stable since 07-23-2007, preserved lumbar lordosis.     Vertebrae: Visualized bone marrow signal is within normal limits. No  marrow edema or evidence of acute osseous abnormality. Intact  visible sacrum and SI joints.     Conus medullaris: Extends to the L1 level and appears normal.     Paraspinal and other soft tissues: Negative.     Disc levels:     Visible lower thoracic levels are negative.     L1-L2: Disc desiccation and mild circumferential disc bulge have  developed since Jul 23, 2007, but there is no associated stenosis.     L2-L3: Stable minor circumferential disc bulge, mild facet  hypertrophy and trace chronic facet joint fluid. No stenosis.     L3-L4: Chronic circumferential disc bulge with broad-based mild  foraminal components. Mild facet and ligament flavum hypertrophy.  Regressed facet joint fluid. Increased mild epidural lipomatosis.  However, no spinal or lateral recess stenosis. Up to mild left  greater than right L3 neural foraminal stenosis is stable since  07-23-07.     L4-L5: Mild foraminal disc bulging greater on the left. Mild to  moderate facet and ligament flavum hypertrophy. No spinal or lateral  recess stenosis. Up to mild left L4 neural foraminal stenosis is  stable.     L5-S1: Moderate to severe bilateral facet hypertrophy. Normal disc.  Borderline to mild L5  foraminal stenosis is stable since 07/23/07.     IMPRESSION:  1. 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 The most pronounced degenerative finding is  moderate and severe lower lumbar facet arthropathy, maximal at  L5-S1. No acute osseous abnormality.  2. Mild disc degeneration has developed at L1-L2 since the 07-23-2007 MRI,  but there is no associated stenosis.  3. No lumbar spinal or lateral recess stenosis. Up to mild neural  foraminal stenosis at the bilateral L3, the left L4, and the  bilateral L5 nerve levels is stable since 2007-07-23.        Electronically Signed    By: Genevie Ann M.D.    On: 01/24/2017 15:33     Objective:  VS:  HT:    WT:   BMI:     BP:126/73  HR:79bpm  TEMP: ( )  RESP:  Physical Exam Vitals and nursing note reviewed.  Constitutional:      General: She is not in acute  distress.    Appearance: Normal appearance. She is not ill-appearing.  HENT:     Head: Normocephalic and atraumatic.     Right Ear: External ear normal.     Left Ear: External ear normal.  Eyes:     Extraocular Movements: Extraocular movements intact.  Cardiovascular:     Rate and Rhythm: Normal rate.     Pulses: Normal pulses.  Pulmonary:     Effort: Pulmonary effort is normal. No respiratory distress.  Abdominal:     General: There is no distension.     Palpations: Abdomen is soft.  Musculoskeletal:        General: Tenderness present.     Cervical back: Neck supple.     Right lower leg: No edema.     Left lower leg: No edema.     Comments: Patient has good distal strength with no pain over the greater trochanters.  No clonus or focal weakness. Patient somewhat slow to rise from a seated position to full extension.  There is concordant low back pain with facet loading and lumbar spine extension rotation.  There are no definitive trigger points but the patient is somewhat tender across the lower back and PSIS.  There is no pain with hip rotation.    Skin:    Findings: No erythema, lesion or rash.  Neurological:     General: No focal deficit present.     Mental Status: She is alert and oriented to person, place, and time.     Sensory: No sensory deficit.     Motor: No weakness or abnormal muscle tone.     Coordination: Coordination normal.  Psychiatric:        Mood and Affect: Mood normal.        Behavior: Behavior normal.     Imaging: No results found.

## 2021-09-04 NOTE — Procedures (Signed)
Lumbar Diagnostic Facet Joint Nerve Block with Fluoroscopic Guidance   Patient: Miranda [name]      Date of Birth: 05/30/1955 MRN: [id number] PCP: [name], MD      Visit Date: 08/15/2021   Universal Protocol:    Date/Time: 05/24/235:49 AM  Consent Given By: the patient  Position: PRONE  Additional Comments: Vital signs were monitored before and after the procedure. Patient was prepped and draped in the usual sterile fashion. The correct patient, procedure, and site was verified.   Injection Procedure Details:   Procedure diagnoses:  1. Spondylosis without myelopathy or radiculopathy, lumbar region      Meds Administered:  Meds ordered this encounter  Medications   bupivacaine (MARCAINE) 0.5 % (with pres) injection 3 mL     Laterality: Bilateral  Location/Site: L5-S1, L4 medial branch and L5 dorsal ramus  Needle: 5.0 in., 25 ga.  Short bevel or Quincke spinal needle  Needle Placement: Oblique pedical  Findings:   -Comments: There was excellent flow of contrast along the articular pillars without intravascular flow.  Procedure Details: The fluoroscope beam is vertically oriented in AP and then obliqued 15 to 20 degrees to the ipsilateral side of the desired nerve to achieve the "Scotty dog" appearance.  The skin over the target area of the junction of the superior articulating process and the transverse process (sacral ala if blocking the L5 dorsal rami) was locally anesthetized with a 1 ml volume of 1% Lidocaine without Epinephrine.  The spinal needle was inserted and advanced in a trajectory view down to the target.   After contact with periosteum and negative aspirate for blood and CSF, correct placement without intravascular or epidural spread was confirmed by injecting 0.5 ml. of Isovue-250.  A spot radiograph was obtained of this image.    Next, a 0.5 ml. volume of the injectate described above was injected. The needle was then redirected to the other  facet joint nerves mentioned above if needed.  Prior to the procedure, the patient was given a Pain Diary which was completed for baseline measurements.  After the procedure, the patient rated their pain every 30 minutes and will continue rating at this frequency for a total of 5 hours.  The patient has been asked to complete the Diary and return to [place] by mail, fax or hand delivered as soon as possible.   Additional Comments:  The patient tolerated the procedure well Dressing: 2 x 2 sterile gauze and Band-Aid    Post-procedure details: Patient was observed during the procedure. Post-procedure instructions were reviewed.  Patient left the clinic in stable condition.

## 2021-09-05 ENCOUNTER — Ambulatory Visit: Payer: Medicaid Other | Admitting: Cardiovascular Disease

## 2021-09-05 ENCOUNTER — Encounter (HOSPITAL_COMMUNITY): Payer: Self-pay | Admitting: Adult Health

## 2021-09-05 ENCOUNTER — Telehealth (HOSPITAL_COMMUNITY): Payer: Self-pay | Admitting: Adult Health

## 2021-09-05 NOTE — Telephone Encounter (Signed)
Just an FYI. We have made several attempts to contact this patient including sending a letter to schedule or reschedule their echocardiogram. We will be removing the patient from the echo WQ.   08/21/21 mailed letter EVD  08/19/21 [facility] to schedule x 3 @ 10/LBW  08/15/21 [facility] to reschedule @ 11:27/LBW  08/13/21 called pt and LVM to call back and reschedule due to pt left after myoview (?Forgot appt) LB[facility] 2:52/LBW     Thank you

## 2021-09-20 ENCOUNTER — Telehealth: Payer: Self-pay | Admitting: Physical Medicine and Rehabilitation

## 2021-09-20 NOTE — Telephone Encounter (Signed)
Patient called. She would like to know if she could have another injection. Her call back number is [phone]

## 2021-10-25 ENCOUNTER — Ambulatory Visit: Payer: Medicaid Other | Admitting: Cardiovascular Disease

## 2021-11-04 ENCOUNTER — Ambulatory Visit: Payer: Self-pay

## 2021-11-04 ENCOUNTER — Encounter: Payer: Self-pay | Admitting: Physical Medicine and Rehabilitation

## 2021-11-04 ENCOUNTER — Ambulatory Visit: Payer: Medicaid Other | Admitting: Physical Medicine and Rehabilitation

## 2021-11-04 VITALS — BP 142/73 | HR 85

## 2021-11-04 DIAGNOSIS — M47816 Spondylosis without myelopathy or radiculopathy, lumbar region: Secondary | ICD-10-CM

## 2021-11-04 MED ORDER — METHYLPREDNISOLONE ACETATE 80 MG/ML IJ SUSP
80.0000 mg | Freq: Once | INTRAMUSCULAR | Status: AC
  Administered 2021-11-04: 80 mg

## 2021-11-04 NOTE — Progress Notes (Signed)
Pt state lower back pain that travels down both knee and foot. Pt state walking, standing and laying down makes the pain worse. Pt state she takes pain meds to help ease her pain.  Numeric Pain Rating Scale and Functional Assessment Average Pain 5   In the last MONTH (on 0-10 scale) has pain interfered with the following?  1. General activity like being  able to carry out your everyday physical activities such as walking, climbing stairs, carrying groceries, or moving a chair?  Rating(10)   +Driver, -BT, -Dye Allergies.

## 2021-11-04 NOTE — Patient Instructions (Signed)

## 2021-11-11 NOTE — Procedures (Signed)
Lumbar Facet Joint Intra-Articular Injection(s) with Fluoroscopic Guidance  Patient: Miranda [name]      Date of Birth: Aug 25, 1955 MRN: [id number] PCP: [name], MD      Visit Date: 11/04/2021   Universal Protocol:    Date/Time: 11/04/2021  Consent Given By: the patient  Position: PRONE   Additional Comments: Vital signs were monitored before and after the procedure. Patient was prepped and draped in the usual sterile fashion. The correct patient, procedure, and site was verified.   Injection Procedure Details:  Procedure Site One Meds Administered:  Meds ordered this encounter  Medications   methylPREDNISolone acetate (DEPO-MEDROL) injection 80 mg     Laterality: Bilateral  Location/Site:  L5-S1  Needle size: 22 guage  Needle type: Spinal  Needle Placement: Articular  Findings:  -Comments: Excellent flow of contrast producing a partial arthrogram.  Procedure Details: The fluoroscope beam is vertically oriented in AP, and the inferior recess is visualized beneath the lower pole of the inferior apophyseal process, which represents the target point for needle insertion. When direct visualization is difficult the target point is located at the medial projection of the vertebral pedicle. The region overlying each aforementioned target is locally anesthetized with a 1 to 2 ml. volume of 1% Lidocaine without Epinephrine.   The spinal needle was inserted into each of the above mentioned facet joints using biplanar fluoroscopic guidance. A 0.25 to 0.5 ml. volume of Isovue-250 was injected and a partial facet joint arthrogram was obtained. A single spot film was obtained of the resulting arthrogram.    One to 1.25 ml of the steroid/anesthetic solution was then injected into each of the facet joints noted above.   Additional Comments:  The patient tolerated the procedure well Dressing: 2 x 2 sterile gauze and Band-Aid    Post-procedure details: Patient was observed  during the procedure. Post-procedure instructions were reviewed.  Patient left the clinic in stable condition.

## 2021-11-11 NOTE — Progress Notes (Signed)
Miranda [name] - 66 y.o. female MRN [id number]  Date of birth: [birth year]  Office Visit Note: Visit Date: 11/04/2021 PCP: [name], MD Referred by: [name], MD  Subjective: Chief Complaint  Patient presents with   Lower Back - Pain   Right Knee - Pain   Left Knee - Pain   Left Foot - Pain   Right Foot - Pain   HPI:  Miranda [name] is a 66 y.o. female who comes in today for planned repeat Bilateral L5-S1 Lumbar facet/medial branch block with fluoroscopic guidance.  The patient has failed conservative care including home exercise, medications, time and activity modification.  This injection will be diagnostic and hopefully therapeutic.  Please see requesting physician notes for further details and justification.  Exam shows concordant low back pain with facet joint loading and extension. Patient received more than 80% pain relief from prior injection.     Referring:Dr. Doneen Poisson and Ellin Goodie, FNP   ROS Otherwise per HPI.  Assessment & Plan: Visit Diagnoses:    ICD-10-CM   1. Spondylosis without myelopathy or radiculopathy, lumbar region  M47.816 XR C-ARM NO REPORT    methylPREDNISolone acetate (DEPO-MEDROL) injection 80 mg    Facet Injection      Plan: No additional findings.   Meds & Orders:  Meds ordered this encounter  Medications   methylPREDNISolone acetate (DEPO-MEDROL) injection 80 mg    Orders Placed This Encounter  Procedures   Facet Injection   XR C-ARM NO REPORT    Follow-up: Return for visit to requesting provider as needed.   Procedures: No procedures performed  Lumbar Facet Joint Intra-Articular Injection(s) with Fluoroscopic Guidance  Patient: Miranda [name]      Date of Birth: 1955/06/07 MRN: [id number] PCP: [name], MD      Visit Date: 11/04/2021   Universal Protocol:    Date/Time: 11/04/2021  Consent Given By: the patient  Position: PRONE   Additional Comments: Vital signs were monitored before  and after the procedure. Patient was prepped and draped in the usual sterile fashion. The correct patient, procedure, and site was verified.   Injection Procedure Details:  Procedure Site One Meds Administered:  Meds ordered this encounter  Medications   methylPREDNISolone acetate (DEPO-MEDROL) injection 80 mg     Laterality: Bilateral  Location/Site:  L5-S1  Needle size: 22 guage  Needle type: Spinal  Needle Placement: Articular  Findings:  -Comments: Excellent flow of contrast producing a partial arthrogram.  Procedure Details: The fluoroscope beam is vertically oriented in AP, and the inferior recess is visualized beneath the lower pole of the inferior apophyseal process, which represents the target point for needle insertion. When direct visualization is difficult the target point is located at the medial projection of the vertebral pedicle. The region overlying each aforementioned target is locally anesthetized with a 1 to 2 ml. volume of 1% Lidocaine without Epinephrine.   The spinal needle was inserted into each of the above mentioned facet joints using biplanar fluoroscopic guidance. A 0.25 to 0.5 ml. volume of Isovue-250 was injected and a partial facet joint arthrogram was obtained. A single spot film was obtained of the resulting arthrogram.    One to 1.25 ml of the steroid/anesthetic solution was then injected into each of the facet joints noted above.   Additional Comments:  The patient tolerated the procedure well Dressing: 2 x 2 sterile gauze and Band-Aid    Post-procedure details: Patient was observed during the procedure. Post-procedure instructions  were reviewed.  Patient left the clinic in stable condition.    Clinical History: MRI LUMBAR SPINE WITHOUT CONTRAST     TECHNIQUE:  Multiplanar, multisequence MR imaging of the lumbar spine was  performed. No intravenous contrast was administered.     COMPARISON:  Lumbar radiograph 10/11/2014.  Lumbar  MRI 04/12/2008.     FINDINGS:  Segmentation: Normal lumbar segmentation demonstrated on the  radiographs which is the same numbering system used on the 07/26/2007 MRI.     Alignment:  Stable since 2007/07/26, preserved lumbar lordosis.     Vertebrae: Visualized bone marrow signal is within normal limits. No  marrow edema or evidence of acute osseous abnormality. Intact  visible sacrum and SI joints.     Conus medullaris: Extends to the L1 level and appears normal.     Paraspinal and other soft tissues: Negative.     Disc levels:     Visible lower thoracic levels are negative.     L1-L2: Disc desiccation and mild circumferential disc bulge have  developed since 07/26/2007, but there is no associated stenosis.     L2-L3: Stable minor circumferential disc bulge, mild facet  hypertrophy and trace chronic facet joint fluid. No stenosis.     L3-L4: Chronic circumferential disc bulge with broad-based mild  foraminal components. Mild facet and ligament flavum hypertrophy.  Regressed facet joint fluid. Increased mild epidural lipomatosis.  However, no spinal or lateral recess stenosis. Up to mild left  greater than right L3 neural foraminal stenosis is stable since  07/26/07.     L4-L5: Mild foraminal disc bulging greater on the left. Mild to  moderate facet and ligament flavum hypertrophy. No spinal or lateral  recess stenosis. Up to mild left L4 neural foraminal stenosis is  stable.     L5-S1: Moderate to severe bilateral facet hypertrophy. Normal disc.  Borderline to mild L5 foraminal stenosis is stable since Jul 26, 2007.     IMPRESSION:  1. 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 The most pronounced degenerative finding is  moderate and severe lower lumbar facet arthropathy, maximal at  L5-S1. No acute osseous abnormality.  2. Mild disc degeneration has developed at L1-L2 since the 07/26/07 MRI,  but there is no associated stenosis.  3. No lumbar spinal or  lateral recess stenosis. Up to mild neural  foraminal stenosis at the bilateral L3, the left L4, and the  bilateral L5 nerve levels is stable since 26-Jul-2007.        Electronically Signed    By: Odessa Fleming M.D.    On: 01/24/2017 15:33     Objective:  VS:  HT:    WT:   BMI:     BP:(!) 142/73  HR:85bpm  TEMP: ( )  RESP:  Physical Exam Vitals and nursing note reviewed.  Constitutional:      General: She is not in acute distress.    Appearance: Normal appearance. She is not ill-appearing.  HENT:     Head: Normocephalic and atraumatic.     Right Ear: External ear normal.     Left Ear: External ear normal.  Eyes:     Extraocular Movements: Extraocular movements intact.  Cardiovascular:     Rate and Rhythm: Normal rate.     Pulses: Normal pulses.  Pulmonary:     Effort: Pulmonary effort is normal. No respiratory distress.  Abdominal:     General: There is no distension.     Palpations: Abdomen is  soft.  Musculoskeletal:        General: Tenderness present.     Cervical back: Neck supple.     Right lower leg: No edema.     Left lower leg: No edema.     Comments: Patient has good distal strength with no pain over the greater trochanters.  No clonus or focal weakness.  Skin:    Findings: No erythema, lesion or rash.  Neurological:     General: No focal deficit present.     Mental Status: She is alert and oriented to person, place, and time.     Sensory: No sensory deficit.     Motor: No weakness or abnormal muscle tone.     Coordination: Coordination normal.  Psychiatric:        Mood and Affect: Mood normal.        Behavior: Behavior normal.      Imaging: No results found.

## 2021-11-14 ENCOUNTER — Other Ambulatory Visit: Payer: Self-pay | Admitting: Cardiovascular Disease

## 2021-11-27 ENCOUNTER — Ambulatory Visit: Payer: Medicaid Other | Admitting: Cardiovascular Disease

## 2021-12-27 ENCOUNTER — Other Ambulatory Visit: Payer: Self-pay | Admitting: Internal Medicine

## 2021-12-31 LAB — URINE CULTURE
MICRO NUMBER:: 13923967
SPECIMEN QUALITY:: ADEQUATE

## 2022-01-06 ENCOUNTER — Other Ambulatory Visit: Payer: Self-pay | Admitting: Physical Medicine and Rehabilitation

## 2022-01-06 ENCOUNTER — Telehealth: Payer: Self-pay | Admitting: Physical Medicine and Rehabilitation

## 2022-01-06 DIAGNOSIS — M47816 Spondylosis without myelopathy or radiculopathy, lumbar region: Secondary | ICD-10-CM

## 2022-01-06 DIAGNOSIS — G8929 Other chronic pain: Secondary | ICD-10-CM

## 2022-01-06 NOTE — Telephone Encounter (Signed)
Pt called and states that pain diary flew out the window. She states that she was suppose to be getting a RFA. She states the last injection worked about 60%  CB [phone]

## 2022-01-15 ENCOUNTER — Ambulatory Visit: Payer: Medicare Other | Admitting: Cardiovascular Disease

## 2022-02-03 ENCOUNTER — Other Ambulatory Visit (HOSPITAL_COMMUNITY): Payer: Self-pay | Admitting: Internal Medicine

## 2022-02-03 DIAGNOSIS — I739 Peripheral vascular disease, unspecified: Secondary | ICD-10-CM

## 2022-02-05 ENCOUNTER — Encounter (HOSPITAL_COMMUNITY): Payer: Self-pay

## 2022-02-05 ENCOUNTER — Ambulatory Visit (HOSPITAL_COMMUNITY): Admission: RE | Admit: 2022-02-05 | Payer: Medicare Other | Source: Ambulatory Visit

## 2022-02-06 ENCOUNTER — Other Ambulatory Visit (HOSPITAL_COMMUNITY): Payer: Self-pay | Admitting: Adult Health

## 2022-02-06 DIAGNOSIS — I739 Peripheral vascular disease, unspecified: Secondary | ICD-10-CM

## 2022-02-06 DIAGNOSIS — R0989 Other specified symptoms and signs involving the circulatory and respiratory systems: Secondary | ICD-10-CM

## 2022-02-10 ENCOUNTER — Ambulatory Visit: Payer: Self-pay

## 2022-02-10 ENCOUNTER — Ambulatory Visit (INDEPENDENT_AMBULATORY_CARE_PROVIDER_SITE_OTHER): Payer: Medicare Other | Admitting: Physical Medicine and Rehabilitation

## 2022-02-10 ENCOUNTER — Ambulatory Visit: Payer: Medicare Other | Admitting: Cardiovascular Disease

## 2022-02-10 ENCOUNTER — Telehealth: Payer: Self-pay | Admitting: Physical Medicine and Rehabilitation

## 2022-02-10 VITALS — BP 134/74 | HR 75

## 2022-02-10 DIAGNOSIS — M47816 Spondylosis without myelopathy or radiculopathy, lumbar region: Secondary | ICD-10-CM | POA: Diagnosis not present

## 2022-02-10 MED ORDER — METHYLPREDNISOLONE ACETATE 80 MG/ML IJ SUSP
40.0000 mg | Freq: Once | INTRAMUSCULAR | Status: AC
  Administered 2022-02-10: 40 mg

## 2022-02-10 NOTE — Telephone Encounter (Signed)
Patient wants to know if she needs someone to drive her for her appointment

## 2022-02-10 NOTE — Progress Notes (Signed)
Miranda [name] - 66 y.o. female MRN [id number]  Date of birth: 1956/03/16  Office Visit Note: Visit Date: 02/10/2022 PCP: [name], MD Referred by: Juanda Chance, NP  Subjective: Chief Complaint  Patient presents with   Lower Back - Pain   HPI:  Miranda [name] is a 66 y.o. female who comes in todayfor planned radiofrequency ablation of the Bilateral L5-S1 Lumbar facet joints. This would be ablation of the corresponding medial branches and/or dorsal rami.  Patient has had double diagnostic blocks with more than 50% relief.  These are documented on pain diary.  They have had chronic back pain for quite some time, more than 3 months, which has been an ongoing situation with recalcitrant axial back pain.  They have no radicular pain.  Their axial pain is worse with standing and ambulating and on exam today with facet loading.  They have had physical therapy as well as home exercise program.  The imaging noted in the chart below indicated facet pathology. Accordingly they meet all the criteria and qualification for for radiofrequency ablation and we are going to complete this today hopefully for more longer term relief as part of comprehensive management program.    ROS Otherwise per HPI.  Assessment & Plan: Visit Diagnoses:    ICD-10-CM   1. Spondylosis without myelopathy or radiculopathy, lumbar region  M47.816 XR C-ARM NO REPORT    Radiofrequency,Lumbar    methylPREDNISolone acetate (DEPO-MEDROL) injection 40 mg      Plan: No additional findings.   Meds & Orders:  Meds ordered this encounter  Medications   methylPREDNISolone acetate (DEPO-MEDROL) injection 40 mg    Orders Placed This Encounter  Procedures   Radiofrequency,Lumbar   XR C-ARM NO REPORT    Follow-up: Return for visit to requesting provider as needed.   Procedures: No procedures performed  Lumbar Facet Joint Nerve Denervation  Patient: Miranda [name]      Date of Birth: 1956/03/04 MRN:  [id number] PCP: [name], MD      Visit Date: 02/10/2022   Universal Protocol:    Date/Time: 10/30/238:12 PM  Consent Given By: the patient  Position: PRONE  Additional Comments: Vital signs were monitored before and after the procedure. Patient was prepped and draped in the usual sterile fashion. The correct patient, procedure, and site was verified.   Injection Procedure Details:   Procedure diagnoses:  1. Spondylosis without myelopathy or radiculopathy, lumbar region      Meds Administered:  Meds ordered this encounter  Medications   methylPREDNISolone acetate (DEPO-MEDROL) injection 40 mg     Laterality: Bilateral  Location/Site:  L5-S1, L4 medial branch and L5 dorsal ramus  Needle: 18 ga.,  46mm active tip, RF Cannula  Needle Placement: Along juncture of superior articular process and transverse pocess  Findings:  -Comments:  Procedure Details: For each desired target nerve, the corresponding transverse process (sacral ala for the L5 dorsal rami) was identified and the fluoroscope was positioned to square off the endplates of the corresponding vertebral body to achieve a true AP midline view.  The beam was then obliqued 15 to 20 degrees and caudally tilted 15 to 20 degrees to line up a trajectory along the target nerves. The skin over the target of the junction of superior articulating process and transverse process (sacral ala for the L5 dorsal rami) was infiltrated with 80ml of 1% Lidocaine without Epinephrine.  The 18 gauge 46mm active tip outer cannula was advanced in trajectory view  to the target.  This procedure was repeated for each target nerve.  Then, for all levels, the outer cannula placement was fine-tuned and the position was then confirmed with bi-planar imaging.    Test stimulation was done both at sensory and motor levels to ensure there was no radicular stimulation. The target tissues were then infiltrated with 1 ml of 1% Lidocaine without  Epinephrine. Subsequently, a percutaneous neurotomy was carried out for 90 seconds at 80 degrees Celsius.  After the completion of the lesion, 1 ml of injectate was delivered. It was then repeated for each facet joint nerve mentioned above. Appropriate radiographs were obtained to verify the probe placement during the neurotomy.   Additional Comments:  The patient tolerated the procedure well Dressing: 2 x 2 sterile gauze and Band-Aid    Post-procedure details: Patient was observed during the procedure. Post-procedure instructions were reviewed.  Patient left the clinic in stable condition.      Clinical History: MRI LUMBAR SPINE WITHOUT CONTRAST     TECHNIQUE:  Multiplanar, multisequence MR imaging of the lumbar spine was  performed. No intravenous contrast was administered.     COMPARISON:  Lumbar radiograph 10/11/2014.  Lumbar MRI 04/12/2008.     FINDINGS:  Segmentation: Normal lumbar segmentation demonstrated on the  radiographs which is the same numbering system used on the July 25, 2007 MRI.     Alignment:  Stable since 2007-07-25, preserved lumbar lordosis.     Vertebrae: Visualized bone marrow signal is within normal limits. No  marrow edema or evidence of acute osseous abnormality. Intact  visible sacrum and SI joints.     Conus medullaris: Extends to the L1 level and appears normal.     Paraspinal and other soft tissues: Negative.     Disc levels:     Visible lower thoracic levels are negative.     L1-L2: Disc desiccation and mild circumferential disc bulge have  developed since Jul 25, 2007, but there is no associated stenosis.     L2-L3: Stable minor circumferential disc bulge, mild facet  hypertrophy and trace chronic facet joint fluid. No stenosis.     L3-L4: Chronic circumferential disc bulge with broad-based mild  foraminal components. Mild facet and ligament flavum hypertrophy.  Regressed facet joint fluid. Increased mild epidural lipomatosis.  However, no spinal or  lateral recess stenosis. Up to mild left  greater than right L3 neural foraminal stenosis is stable since  2007/07/25.     L4-L5: Mild foraminal disc bulging greater on the left. Mild to  moderate facet and ligament flavum hypertrophy. No spinal or lateral  recess stenosis. Up to mild left L4 neural foraminal stenosis is  stable.     L5-S1: Moderate to severe bilateral facet hypertrophy. Normal disc.  Borderline to mild L5 foraminal stenosis is stable since Jul 25, 2007.     IMPRESSION:  1. 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 The most pronounced degenerative finding is  moderate and severe lower lumbar facet arthropathy, maximal at  L5-S1. No acute osseous abnormality.  2. Mild disc degeneration has developed at L1-L2 since the Jul 25, 2007 MRI,  but there is no associated stenosis.  3. No lumbar spinal or lateral recess stenosis. Up to mild neural  foraminal stenosis at the bilateral L3, the left L4, and the  bilateral L5 nerve levels is stable since Jul 25, 2007.        Electronically Signed    By: Genevie Ann M.D.    On: 01/24/2017 15:33  Objective:  VS:  HT:    WT:   BMI:     BP:134/74  HR:75bpm  TEMP: ( )  RESP:  Physical Exam Vitals and nursing note reviewed.  Constitutional:      General: She is not in acute distress.    Appearance: Normal appearance. She is not ill-appearing.  HENT:     Head: Normocephalic and atraumatic.     Right Ear: External ear normal.     Left Ear: External ear normal.  Eyes:     Extraocular Movements: Extraocular movements intact.  Cardiovascular:     Rate and Rhythm: Normal rate.     Pulses: Normal pulses.  Pulmonary:     Effort: Pulmonary effort is normal. No respiratory distress.  Abdominal:     General: There is no distension.     Palpations: Abdomen is soft.  Musculoskeletal:        General: Tenderness present.     Cervical back: Neck supple.     Right lower leg: No edema.     Left lower leg: No  edema.     Comments: Patient has good distal strength with no pain over the greater trochanters.  No clonus or focal weakness.  Skin:    Findings: No erythema, lesion or rash.  Neurological:     General: No focal deficit present.     Mental Status: She is alert and oriented to person, place, and time.     Sensory: No sensory deficit.     Motor: No weakness or abnormal muscle tone.     Coordination: Coordination normal.  Psychiatric:        Mood and Affect: Mood normal.        Behavior: Behavior normal.      Imaging: XR C-ARM NO REPORT  Result Date: 02/10/2022 Please see Notes tab for imaging impression.

## 2022-02-10 NOTE — Procedures (Signed)
Lumbar Facet Joint Nerve Denervation  Patient: Miranda [name]      Date of Birth: 12-19-1955 MRN: [id number] PCP: Nolene Ebbs, MD      Visit Date: 02/10/2022   Universal Protocol:    Date/Time: 10/30/238:12 PM  Consent Given By: the patient  Position: PRONE  Additional Comments: Vital signs were monitored before and after the procedure. Patient was prepped and draped in the usual sterile fashion. The correct patient, procedure, and site was verified.   Injection Procedure Details:   Procedure diagnoses:  1. Spondylosis without myelopathy or radiculopathy, lumbar region      Meds Administered:  Meds ordered this encounter  Medications   methylPREDNISolone acetate (DEPO-MEDROL) injection 40 mg     Laterality: Bilateral  Location/Site:  L5-S1, L4 medial branch and L5 dorsal ramus  Needle: 18 ga.,  40mm active tip, 150mm RF Cannula  Needle Placement: Along juncture of superior articular process and transverse pocess  Findings:  -Comments:  Procedure Details: For each desired target nerve, the corresponding transverse process (sacral ala for the L5 dorsal rami) was identified and the fluoroscope was positioned to square off the endplates of the corresponding vertebral body to achieve a true AP midline view.  The beam was then obliqued 15 to 20 degrees and caudally tilted 15 to 20 degrees to line up a trajectory along the target nerves. The skin over the target of the junction of superior articulating process and transverse process (sacral ala for the L5 dorsal rami) was infiltrated with 29ml of 1% Lidocaine without Epinephrine.  The 18 gauge 70mm active tip outer cannula was advanced in trajectory view to the target.  This procedure was repeated for each target nerve.  Then, for all levels, the outer cannula placement was fine-tuned and the position was then confirmed with bi-planar imaging.    Test stimulation was done both at sensory and motor levels to ensure there  was no radicular stimulation. The target tissues were then infiltrated with 1 ml of 1% Lidocaine without Epinephrine. Subsequently, a percutaneous neurotomy was carried out for 90 seconds at 80 degrees Celsius.  After the completion of the lesion, 1 ml of injectate was delivered. It was then repeated for each facet joint nerve mentioned above. Appropriate radiographs were obtained to verify the probe placement during the neurotomy.   Additional Comments:  The patient tolerated the procedure well Dressing: 2 x 2 sterile gauze and Band-Aid    Post-procedure details: Patient was observed during the procedure. Post-procedure instructions were reviewed.  Patient left the clinic in stable condition.

## 2022-02-10 NOTE — Progress Notes (Signed)
Numeric Pain Rating Scale and Functional Assessment Average Pain 8   In the last MONTH (on 0-10 scale) has pain interfered with the following?  1. General activity like being  able to carry out your everyday physical activities such as walking, climbing stairs, carrying groceries, or moving a chair?  Rating(9)   +Driver, -BT- Aspirin 81 mg, -Dye Allergies.  Right sided back pain. Physical activity makes pain worse

## 2022-02-10 NOTE — Patient Instructions (Signed)

## 2022-02-10 NOTE — Telephone Encounter (Signed)
Patient here for appointment and informed she needed a driver

## 2022-02-18 ENCOUNTER — Ambulatory Visit
Admission: RE | Admit: 2022-02-18 | Discharge: 2022-02-18 | Disposition: A | Discharge status: Home / Self Care | Payer: Medicare Other | Source: Ambulatory Visit | Attending: Internal Medicine | Admitting: Internal Medicine

## 2022-02-18 ENCOUNTER — Ambulatory Visit (INDEPENDENT_AMBULATORY_CARE_PROVIDER_SITE_OTHER): Payer: Medicare Other | Admitting: Cardiovascular Disease

## 2022-02-18 ENCOUNTER — Other Ambulatory Visit (HOSPITAL_COMMUNITY): Payer: Self-pay | Admitting: Cardiovascular Disease

## 2022-02-18 ENCOUNTER — Encounter: Payer: Self-pay | Admitting: Cardiovascular Disease

## 2022-02-18 VITALS — BP 134/76 | HR 75 | Ht 70.0 in | Wt 225.2 lb

## 2022-02-18 DIAGNOSIS — I739 Peripheral vascular disease, unspecified: Secondary | ICD-10-CM

## 2022-02-18 DIAGNOSIS — E785 Hyperlipidemia, unspecified: Secondary | ICD-10-CM | POA: Diagnosis present

## 2022-02-18 DIAGNOSIS — M79606 Pain in leg, unspecified: Secondary | ICD-10-CM

## 2022-02-18 DIAGNOSIS — Z9861 Coronary angioplasty status: Secondary | ICD-10-CM | POA: Diagnosis present

## 2022-02-18 DIAGNOSIS — I10 Essential (primary) hypertension: Secondary | ICD-10-CM | POA: Diagnosis present

## 2022-02-18 DIAGNOSIS — I2510 Atherosclerotic heart disease of native coronary artery without angina pectoris: Secondary | ICD-10-CM

## 2022-02-18 DIAGNOSIS — R0989 Other specified symptoms and signs involving the circulatory and respiratory systems: Secondary | ICD-10-CM | POA: Insufficient documentation

## 2022-02-18 MED ORDER — CILOSTAZOL 50 MG PO TABS: 50.0000 mg | ORAL_TABLET | Freq: Two times a day (BID) | ORAL | 2 refills | Status: DC

## 2022-02-18 NOTE — Patient Instructions (Signed)
Medication Instructions:  Your physician has recommended you make the following change in your medication:   -Start taking cilostazol (pletal) 50mg  twice daily.   *If you need a refill on your cardiac medications before your next appointment, please call your pharmacy*   Follow-Up: At [facility], you and your health needs are our priority.  As part of our continuing mission to provide you with exceptional heart care, we have created designated Provider Care Teams.  These Care Teams include your primary Cardiologist (physician) and Advanced Practice Providers (APPs -  Physician Assistants and Nurse Practitioners) who all work together to provide you with the care you need, when you need it.  We recommend signing up for the patient portal called "MyChart".  Sign up information is provided on this After Visit Summary.  MyChart is used to connect with patients for Virtual Visits (Telemedicine).  Patients are able to view lab/test results, encounter notes, upcoming appointments, etc.  Non-urgent messages can be sent to your provider as well.   To learn more about what you can do with MyChart, go to [email or web address].    Your next appointment:   3 month(s)  The format for your next appointment:   In Person  Provider:   Jory Sims, DNP, ANP       Then, Quay Burow, MD will plan to see you again in 6 month(s).

## 2022-02-18 NOTE — Progress Notes (Signed)
02/18/2022 Miranda [name]   01/01/1956  [id number]  Primary Physician [name], MD Primary Cardiologist: [name] MD FACP, [place], [place], [place]  HPI:  Miranda [name] is a 66 y.o.  moderately overweight divorced African-American female mother of 3, grandmother of one grandchild who I last saw in the office 03/08/2019.[name] She has a history of treated hypertension, diabetes, hyperlipidemia and tobacco abuse of approximately 20-30 pack years currently smoking 3-5 cigarettes a day. She is disabled because of her back and knees as well as diabetes. She's had a stroke in the past. She was admitted with chest pain and had a minimally elevated troponin. She underwent cardiac catheterization on 11/21/15 by Dr. [place] revealing a 90% proximal AV groove circumflex 60% distal LAD lesion. Her circumflex was stented with a drug-eluting stent. She's had no recurrent symptoms.   Since I saw her in the office 3 years ago she did see Joni Reining back in April of this year complaining of dyspnea.  Of subsequent Myoview stress test performed 08/13/2021 was nonischemic with normal EF.  She is also complained of leg pain.  Is difficult to know whether this is claudication versus multifactorial from problems with her knees and back which she has had for a long time.  She did have lower extremity arterial Doppler studies performed today that showed a right ABI of 0.54 and a left of 0.70.  She had a short segment occlusion mid right SFA with tibial vessel disease and high-grade segmental mid left SFA stenosis.  Current Meds  Medication Sig   ACCU-CHEK AVIVA PLUS test strip 1 each as needed.   albuterol (PROVENTIL HFA;VENTOLIN HFA) 108 (90 Base) MCG/ACT inhaler Inhale 2 puffs into the lungs every 6 (six) hours as needed for wheezing or shortness of breath.   amLODipine (NORVASC) 5 MG tablet Take 5 mg by mouth daily.   atorvastatin (LIPITOR) 80 MG tablet TAKE 1 TABLET EVERY DAY AT 6 PM   B-D ULTRAFINE III  SHORT PEN 31G X 8 MM MISC 1 each as needed.   buPROPion (WELLBUTRIN SR) 150 MG 12 hr tablet Take 150 mg by mouth daily.   carvedilol (COREG) 12.5 MG tablet TAKE 1 TABLET (12.5 MG TOTAL) BY MOUTH 2 (TWO) TIMES DAILY WITH A MEAL.   cilostazol (PLETAL) 50 MG tablet Take 1 tablet (50 mg total) by mouth 2 (two) times daily.   cloNIDine (CATAPRES) 0.1 MG tablet Take 0.1 mg by mouth daily as needed (sleep).    Cyanocobalamin (B-12) 1000 MCG CAPS Take 1,000 mcg by mouth daily.    cyclobenzaprine (FLEXERIL) 10 MG tablet Take 10 mg by mouth 3 (three) times daily.   diclofenac sodium (VOLTAREN) 1 % GEL Apply 4 g topically 4 (four) times daily.   diclofenac Sodium (VOLTAREN) 1 % GEL Apply 1 application topically 4 (four) times daily as needed for pain.   furosemide (LASIX) 20 MG tablet Take 1 tablet (20 mg total) by mouth daily.   gabapentin (NEURONTIN) 300 MG capsule Take 300 mg by mouth 3 (three) times daily.    Glycerin, Adult, 2.1 g SUPP Place 1 suppository rectally daily as needed for moderate constipation.   JARDIANCE 10 MG TABS tablet Take 10 mg by mouth daily.   LANTUS SOLOSTAR 100 UNIT/ML Solostar Pen Inject 50 Units into the skin in the morning and at bedtime.   metFORMIN (GLUCOPHAGE) 1000 MG tablet Take 1,000 mg by mouth 2 (two) times daily.   metFORMIN (GLUMETZA) 1000  MG (MOD) 24 hr tablet Take 1 tablet (1,000 mg total) by mouth 2 (two) times daily with a meal. Restart tomorrow   MOVANTIK 25 MG TABS tablet Take 25 mg by mouth daily before breakfast.    ondansetron (ZOFRAN ODT) 4 MG disintegrating tablet Take 1 tablet (4 mg total) by mouth every 6 (six) hours as needed.   oxyCODONE-acetaminophen (PERCOCET) 7.5-325 MG tablet Take 1 tablet by mouth every 4 (four) hours as needed for severe pain.   polyethylene glycol (MIRALAX / GLYCOLAX) packet Take 17 g by mouth daily as needed.   potassium chloride (KLOR-CON) 10 MEQ tablet Take 1 tablet (10 mEq total) by mouth daily.   traZODone (DESYREL) 50 MG  tablet Take 50 mg by mouth at bedtime as needed for sleep.    Vitamin D, Ergocalciferol, (DRISDOL) 50000 units CAPS capsule Take 50,000 Units by mouth every 7 (seven) days. monday     No Known Allergies  Social History   Socioeconomic History   Marital status: Divorced    Spouse name: Not on file   Number of children: Not on file   Years of education: Not on file   Highest education level: Not on file  Occupational History   Not on file  Tobacco Use   Smoking status: Every Day    Packs/day: 0.25    Years: 45.00    Total pack years: 11.25    Types: Cigarettes   Smokeless tobacco: Never  Vaping Use   Vaping Use: Never used  Substance and Sexual Activity   Alcohol use: No   Drug use: No   Sexual activity: Never  Other Topics Concern   Not on file  Social History Narrative   Not on file   Social Determinants of Health   Financial Resource Strain: Not on file  Food Insecurity: Not on file  Transportation Needs: Not on file  Physical Activity: Not on file  Stress: Not on file  Social Connections: Not on file  Intimate Partner Violence: Not on file     Review of Systems: General: negative for chills, fever, night sweats or weight changes.  Cardiovascular: negative for chest pain, dyspnea on exertion, edema, orthopnea, palpitations, paroxysmal nocturnal dyspnea or shortness of breath Dermatological: negative for rash Respiratory: negative for cough or wheezing Urologic: negative for hematuria Abdominal: negative for nausea, vomiting, diarrhea, bright red blood per rectum, melena, or hematemesis Neurologic: negative for visual changes, syncope, or dizziness All other systems reviewed and are otherwise negative except as noted above.    Blood pressure 134/76, pulse 75, height 5\' 10"  (1.778 m), weight 225 lb 3.2 oz (102.2 kg), SpO2 96 %.  General appearance: alert and no distress Neck: no adenopathy, no carotid bruit, no JVD, supple, symmetrical, trachea midline, and  thyroid not enlarged, symmetric, no tenderness/mass/nodules Lungs: clear to auscultation bilaterally Heart: regular rate and rhythm, S1, S2 normal, no murmur, click, rub or gallop Extremities: extremities normal, atraumatic, no cyanosis or edema Pulses: Diminished pedal pulses Skin: Skin color, texture, turgor normal. No rashes or lesions Neurologic: Grossly normal  EKG sinus rhythm at 75 with nonspecific ST and T wave changes.  Personally reviewed this EKG.  ASSESSMENT AND PLAN:   Essential hypertension History of essential hypertension a blood pressure measured today at 134/76.  She is on amlodipine, carvedilol, clonidine.  Dyslipidemia Dyslipidemia on high-dose statin therapy with lipid profile performed/5/23 revealing total cholesterol 138, LDL 82 and HDL 39.  CAD S/P percutaneous coronary angioplasty She of CAD status post  non-STEMI with cardiac catheterization performed by Dr. [place] 11/21/2015 revealing 90% proximal AV groove circumflex and 60% distal LAD lesion.  She underwent circumflex stenting.  She denies chest pain.  She was complaining of some shortness of breath and a recent Myoview stress test performed 08/13/2021 ordered by Beckie Busing, NP showed no ischemia with normal EF.  Tobacco abuse Ongoing tobacco abuse of 5 to 10 cigarettes a day having smoked 20 to 30 pack years recalcitrant to factor modification.  Peripheral arterial disease ([place]) Ms. Cohill has pain in her legs when she walks.  She has had back and knee pain for a long time.  She did have lower extremity arterial Doppler studies performed today revealing a right ABI of 0.54 and left of 0.70.  She did have a short segment occlusion mid right SFA with tibial vessel disease and high-grade segmental mid left SFA stenosis.  At this point, I am not convinced that her difficulty ambulating is entirely related to his PAD but I suspect is multifactorial.  Given her ongoing tobacco abuse I think it is unlikely that she  would have a durable result with endovascular therapy.  I am going to begin her on Pletal 50 mg p.o. twice daily and have her see Jory Sims, NP back in 3 months.     Lorretta Harp MD FACP,FACC,FAHA, [facility] 02/18/2022 4:05 PM

## 2022-02-18 NOTE — Assessment & Plan Note (Signed)
She of CAD status post non-STEMI with cardiac catheterization performed by Dr. [place] 11/21/2015 revealing 90% proximal AV groove circumflex and 60% distal LAD lesion.  She underwent circumflex stenting.  She denies chest pain.  She was complaining of some shortness of breath and a recent Myoview stress test performed 08/13/2021 ordered by Beckie Busing, NP showed no ischemia with normal EF.

## 2022-02-18 NOTE — Assessment & Plan Note (Signed)
Ongoing tobacco abuse of 5 to 10 cigarettes a day having smoked 20 to 30 pack years recalcitrant to factor modification.

## 2022-02-18 NOTE — Assessment & Plan Note (Signed)
Miranda [name] has pain in her legs when she walks.  She has had back and knee pain for a long time.  She did have lower extremity arterial Doppler studies performed today revealing a right ABI of 0.54 and left of 0.70.  She did have a short segment occlusion mid right SFA with tibial vessel disease and high-grade segmental mid left SFA stenosis.  At this point, I am not convinced that her difficulty ambulating is entirely related to his PAD but I suspect is multifactorial.  Given her ongoing tobacco abuse I think it is unlikely that she would have a durable result with endovascular therapy.  I am going to begin her on Pletal 50 mg p.o. twice daily and have her see Jory Sims, NP back in 3 months.

## 2022-02-18 NOTE — Assessment & Plan Note (Signed)
History of essential hypertension a blood pressure measured today at 134/76.  She is on amlodipine, carvedilol, clonidine.

## 2022-02-18 NOTE — Assessment & Plan Note (Signed)
Dyslipidemia on high-dose statin therapy with lipid profile performed/5/23 revealing total cholesterol 138, LDL 82 and HDL 39.

## 2022-04-14 ENCOUNTER — Encounter (HOSPITAL_COMMUNITY): Payer: Self-pay

## 2022-04-14 ENCOUNTER — Ambulatory Visit (HOSPITAL_COMMUNITY)
Admission: EM | Admit: 2022-04-14 | Discharge: 2022-04-14 | Disposition: A | Discharge status: Home / Self Care | Payer: 59 | Attending: Nurse Practitioner | Admitting: Nurse Practitioner

## 2022-04-14 DIAGNOSIS — N760 Acute vaginitis: Secondary | ICD-10-CM

## 2022-04-14 DIAGNOSIS — N3000 Acute cystitis without hematuria: Secondary | ICD-10-CM | POA: Diagnosis present

## 2022-04-14 LAB — POCT URINALYSIS DIPSTICK, ED / UC
Bilirubin Urine: NEGATIVE
Glucose, UA: >=1000 mg/dL — AB
Hgb urine dipstick: NEGATIVE
Ketones, ur: NEGATIVE mg/dL
Nitrite: POSITIVE — AB
Protein, ur: NEGATIVE mg/dL
Specific Gravity, Urine: <=1.005 (ref 1.005–1.030)
Urobilinogen, UA: 0.2 mg/dL (ref 0.0–1.0)
pH: 5 (ref 5.0–8.0)

## 2022-04-14 MED ORDER — CEPHALEXIN 500 MG PO CAPS: 500.0000 mg | ORAL_CAPSULE | Freq: Two times a day (BID) | ORAL | 0 refills | Status: AC

## 2022-04-14 MED ORDER — FLUCONAZOLE 150 MG PO TABS: 150.0000 mg | ORAL_TABLET | Freq: Every day | ORAL | 0 refills | Status: AC

## 2022-04-14 NOTE — ED Triage Notes (Signed)
Patient with c/o left side flank pain. States she has urinary frequency and it burns when she is finishing urinating.

## 2022-04-14 NOTE — Discharge Instructions (Signed)
Keflex twice daily for 7 days.  The clinic will contact you with results of your urine culture are positive Diflucan x 1 to treat vaginal discharge/yeast infection Rest and fluids Follow-up with PCP 2 to 3 days for recheck Please go to the ER if you have any worsening symptoms

## 2022-04-14 NOTE — ED Provider Notes (Signed)
[place]    CSN: [id number] Arrival date & time: 04/14/22  1704      History   Chief Complaint Chief Complaint  Patient presents with   Flank Pain   Urinary Frequency    HPI Miranda [name] is a 67 y.o. female who presents for evaluation of dysuria. Patient reports associated symptoms of urinary burning, urgency, and frequency that have been intermittent for the past month. Patient reports a history of UTI's but denies history of pyelonephritis. Patient denies fever, chills, nausea, vomiting, abdominal pain, flank pain.  Reports some left low back pain but states this has been ongoing for a while as well.  She reports pruritic vaginal discharge over the past month as well.  Denies STD concern or exposure.  She states she was on antibiotics 4 to 5 weeks ago for UTI.  Its UTI symptoms resolved with treatment.  She is diabetic and states she gets yeast infections often.  Patient has taken nothing for symptoms. No other c/o today.    Flank Pain  Urinary Frequency    Past Medical History:  Diagnosis Date   Acid reflux    Anxiety    Asthma    Degenerative disc disease    Depression    Diabetes mellitus    Hypertension    Hypoglycemia 12/30/2016   Stroke ([place])    04/22/12    Patient Active Problem List   Diagnosis Date Noted   Peripheral arterial disease ([place]) 02/18/2022   Unilateral primary osteoarthritis, right knee 03/27/2021   Unilateral primary osteoarthritis, left knee 03/27/2021   Acute right-sided low back pain without sciatica    Peripheral neuropathy 02/08/2018   Acute pancreatitis 02/07/2018   CAD S/P percutaneous coronary angioplasty 01/29/2017   Tobacco abuse 01/29/2017   Chronic back pain 01/29/2017   Hypoglycemia 12/30/2016   Insulin dependent diabetes mellitus 11/21/2015   Dyslipidemia 11/21/2015   NSTEMI (non-ST elevated myocardial infarction) [facility])    TIA (transient ischemic attack) 05/31/2014   History of stroke 04/22/2012    Hypokalemia 04/22/2012   Diabetes mellitus ([place]) 04/22/2012   Essential hypertension 04/22/2012    Past Surgical History:  Procedure Laterality Date   CARDIAC CATHETERIZATION N/A 11/21/2015   Procedure: Left Heart Cath and Coronary Angiography;  Surgeon: Peter M Martinique, MD;  Location: [place] [facility];  Service: Cardiovascular;  Laterality: N/A;   CARDIAC CATHETERIZATION N/A 11/21/2015   Procedure: Coronary Stent Intervention;  Surgeon: Peter M Martinique, MD;  Location: [place] [facility];  Service: Cardiovascular;  Laterality: N/A;   KNEE SURGERY     TUBAL LIGATION      OB History   No obstetric history on file.      Home Medications    Prior to Admission medications   Medication Sig Start Date End Date Taking? Authorizing Provider  cephALEXin (KEFLEX) 500 MG capsule Take 1 capsule (500 mg total) by mouth 2 (two) times daily for 7 days. 04/14/22 04/21/22 Yes Melynda Ripple, NP  fluconazole (DIFLUCAN) 150 MG tablet Take 1 tablet (150 mg total) by mouth daily for 1 dose. 04/14/22 04/15/22 Yes Melynda Ripple, NP  ACCU-CHEK AVIVA PLUS test strip 1 each as needed. 10/14/16   [provider]  albuterol (PROVENTIL HFA;VENTOLIN HFA) 108 (90 Base) MCG/ACT inhaler Inhale 2 puffs into the lungs every 6 (six) hours as needed for wheezing or shortness of breath.    [provider]  amLODipine (NORVASC) 5 MG tablet Take 5 mg by mouth daily.  08/14/19   [provider]  atorvastatin (LIPITOR) 80 MG tablet TAKE 1 TABLET EVERY DAY AT 6 PM 06/21/20   Martinique, Peter M, MD  B-D ULTRAFINE III SHORT PEN 31G X 8 MM MISC 1 each as needed. 11/18/16   [provider]  buPROPion (WELLBUTRIN SR) 150 MG 12 hr tablet Take 150 mg by mouth daily. 02/09/22   [provider]  carvedilol (COREG) 12.5 MG tablet TAKE 1 TABLET (12.5 MG TOTAL) BY MOUTH 2 (TWO) TIMES DAILY WITH A MEAL. 06/24/21   Lorretta Harp, MD  cilostazol (PLETAL) 50 MG tablet Take 1 tablet (50 mg total) by mouth 2 (two) times  daily. 02/18/22   Lorretta Harp, MD  cloNIDine (CATAPRES) 0.1 MG tablet Take 0.1 mg by mouth daily as needed (sleep).  08/14/19   [provider]  Cyanocobalamin (B-12) 1000 MCG CAPS Take 1,000 mcg by mouth daily.     [provider]  cyclobenzaprine (FLEXERIL) 10 MG tablet Take 10 mg by mouth 3 (three) times daily. 01/31/22   [provider]  diclofenac sodium (VOLTAREN) 1 % GEL Apply 4 g topically 4 (four) times daily. 02/11/18   Debbe Odea, MD  diclofenac Sodium (VOLTAREN) 1 % GEL Apply 1 application topically 4 (four) times daily as needed for pain. 08/31/19   [provider]  furosemide (LASIX) 20 MG tablet Take 1 tablet (20 mg total) by mouth daily. 09/05/19   Lacretia Leigh, MD  gabapentin (NEURONTIN) 300 MG capsule Take 300 mg by mouth 3 (three) times daily.     [provider]  Glycerin, Adult, 2.1 g SUPP Place 1 suppository rectally daily as needed for moderate constipation. 02/11/18   Debbe Odea, MD  JARDIANCE 10 MG TABS tablet Take 10 mg by mouth daily. 01/13/22   [provider]  LANTUS SOLOSTAR 100 UNIT/ML Solostar Pen Inject 50 Units into the skin in the morning and at bedtime. 08/15/19   [provider]  metFORMIN (GLUCOPHAGE) 1000 MG tablet Take 1,000 mg by mouth 2 (two) times daily. 08/14/19   [provider]  metFORMIN (GLUMETZA) 1000 MG (MOD) 24 hr tablet Take 1 tablet (1,000 mg total) by mouth 2 (two) times daily with a meal. Restart tomorrow 11/23/15   Domenic Polite, MD  MOVANTIK 25 MG TABS tablet Take 25 mg by mouth daily before breakfast.  12/12/16   [provider]  ondansetron (ZOFRAN ODT) 4 MG disintegrating tablet Take 1 tablet (4 mg total) by mouth every 6 (six) hours as needed. 02/05/18   Ward, Delice Bison, DO  oxyCODONE-acetaminophen (PERCOCET) 7.5-325 MG tablet Take 1 tablet by mouth every 4 (four) hours as needed for severe pain.    [provider]  polyethylene glycol (MIRALAX /  GLYCOLAX) packet Take 17 g by mouth daily as needed. 02/11/18   Debbe Odea, MD  potassium chloride (KLOR-CON) 10 MEQ tablet Take 1 tablet (10 mEq total) by mouth daily. 09/05/19   Lacretia Leigh, MD  traZODone (DESYREL) 50 MG tablet Take 50 mg by mouth at bedtime as needed for sleep.     [provider]  Vitamin D, Ergocalciferol, (DRISDOL) 50000 units CAPS capsule Take 50,000 Units by mouth every 7 (seven) days. monday    [provider]    Family History Family History  Problem Relation Age of Onset   Hypertension Other     Social History Social History   Tobacco Use   Smoking status: Every Day    Packs/day:  0.25    Years: 45.00    Total pack years: 11.25    Types: Cigarettes   Smokeless tobacco: Never  Vaping Use   Vaping Use: Never used  Substance Use Topics   Alcohol use: No   Drug use: No     Allergies   Patient has no known allergies.   Review of Systems Review of Systems  Genitourinary:  Positive for dysuria.  Musculoskeletal:  Positive for back pain.     Physical Exam Triage Vital Signs ED Triage Vitals  Enc Vitals Group     BP 04/14/22 1756 (!) 147/77     Pulse Rate 04/14/22 1756 74     Resp 04/14/22 1756 18     Temp 04/14/22 1756 98.1 F (36.7 C)     Temp Source 04/14/22 1756 Oral     SpO2 04/14/22 1756 93 %     Weight --      Height --      Head Circumference --      Peak Flow --      Pain Score 04/14/22 1757 7     Pain Loc --      Pain Edu? --      Excl. in [place]? --    No data found.  Updated Vital Signs BP (!) 147/77 (BP Location: Left Arm)   Pulse 74   Temp 98.1 F (36.7 C) (Oral)   Resp 18   SpO2 93%   Visual Acuity Right Eye Distance:   Left Eye Distance:   Bilateral Distance:    Right Eye Near:   Left Eye Near:    Bilateral Near:     Physical Exam Vitals and nursing note reviewed.  Constitutional:      Appearance: Normal appearance.  HENT:     Head: Normocephalic and atraumatic.  Eyes:      Pupils: Pupils are equal, round, and reactive to light.  Cardiovascular:     Rate and Rhythm: Normal rate.  Pulmonary:     Effort: Pulmonary effort is normal.  Abdominal:     General: Bowel sounds are normal.     Palpations: Abdomen is soft.     Tenderness: There is no abdominal tenderness. There is no right CVA tenderness or left CVA tenderness.  Musculoskeletal:     Comments: Mild tenderness to palpation to the left paralumbar muscles.  No spinal tenderness with palpation.  Skin:    General: Skin is warm and dry.  Neurological:     General: No focal deficit present.     Mental Status: She is alert and oriented to person, place, and time.  Psychiatric:        Mood and Affect: Mood normal.        Behavior: Behavior normal.      UC Treatments / Results  Labs (all labs ordered are listed, but only abnormal results are displayed) Labs Reviewed  POCT URINALYSIS DIPSTICK, ED / UC - Abnormal; Notable for the following components:      Result Value   Glucose, UA >=1000 (*)    Nitrite POSITIVE (*)    Leukocytes,Ua TRACE (*)    All other components within normal limits  URINE CULTURE  CERVICOVAGINAL ANCILLARY ONLY    EKG   Radiology No results found.  Procedures Procedures (including critical care time)  Medications Ordered in UC Medications - No data to display  Initial Impression / Assessment and Plan / UC Course  I have reviewed the triage vital signs and  the nursing notes.  Pertinent labs & imaging results that were available during my care of the patient were reviewed by me and considered in my medical decision making (see chart for details).     UA positive for UTI, send urine culture and start Keflex.  Exam not consistent with pyelonephritis Diflucan to treat vaginitis/yeast infection.  Self swab for BV/yeast sent to lab and will contact if these are positive Rest and fluids Follow-up with PCP 2 to 3 days for recheck ER precautions reviewed and patient  verbalized understanding Final Clinical Impressions(s) / UC Diagnoses   Final diagnoses:  Acute vaginitis  Acute cystitis without hematuria     Discharge Instructions      Keflex twice daily for 7 days.  The clinic will contact you with results of your urine culture are positive Diflucan x 1 to treat vaginal discharge/yeast infection Rest and fluids Follow-up with PCP 2 to 3 days for recheck Please go to the ER if you have any worsening symptoms     ED Prescriptions     Medication Sig Dispense Auth. Provider   cephALEXin (KEFLEX) 500 MG capsule Take 1 capsule (500 mg total) by mouth 2 (two) times daily for 7 days. 14 capsule Melynda Ripple, NP   fluconazole (DIFLUCAN) 150 MG tablet Take 1 tablet (150 mg total) by mouth daily for 1 dose. 1 tablet Melynda Ripple, NP      PDMP not reviewed this encounter.   Melynda Ripple, NP 04/14/22 [phone]

## 2022-04-15 LAB — CERVICOVAGINAL ANCILLARY ONLY
Bacterial Vaginitis (gardnerella): POSITIVE — AB
Candida Glabrata: POSITIVE — AB
Candida Vaginitis: POSITIVE — AB
Comment: NEGATIVE
Comment: NEGATIVE
Comment: NEGATIVE

## 2022-04-16 ENCOUNTER — Telehealth (HOSPITAL_COMMUNITY): Payer: Self-pay | Admitting: Emergency Medicine

## 2022-04-16 LAB — URINE CULTURE: Culture: >=100000 — AB

## 2022-04-16 MED ORDER — METRONIDAZOLE 500 MG PO TABS: 500.0000 mg | ORAL_TABLET | Freq: Two times a day (BID) | ORAL | 0 refills | Status: DC

## 2022-04-17 ENCOUNTER — Ambulatory Visit: Payer: 59 | Admitting: Podiatry

## 2022-04-25 ENCOUNTER — Other Ambulatory Visit: Payer: Self-pay | Admitting: Nurse Practitioner

## 2022-04-25 ENCOUNTER — Ambulatory Visit: Payer: Medicare Other

## 2022-04-25 DIAGNOSIS — Z1231 Encounter for screening mammogram for malignant neoplasm of breast: Secondary | ICD-10-CM

## 2022-05-17 ENCOUNTER — Other Ambulatory Visit: Payer: Self-pay | Admitting: Cardiovascular Disease

## 2022-05-21 NOTE — Progress Notes (Deleted)
Cardiology Clinic Note   Patient Name: Miranda [name] Date of Encounter: 05/21/2022  Primary Care Provider:  Nolene Ebbs, MD Primary Cardiologist:  Quay Burow, MD  Patient Profile    67 year old female with history of HTN, HL, ongoing tobacco abuse,with chronic DOE,  CAD with cardiac cath 12/22/2015 revealing a 90% proximal AV groove circumflex 60% distal LAD lesion. Her circumflex was stented with a drug-eluting stent. Repeat NM stress test on 08/13/2021 was nonischemic with normal EF. LE doppler studies on  02/18/2022 revealed ABI 0.54 and a left of 0.70. She had a short segment occlusion mid right SFA with tibial vessel disease and high-grade segmental mid left SFA stenosis. Due to ongoing tobacco abuse, Dr. Gwenlyn Found indicated that she would a durable result with endovascular surgery.   Past Medical History    Past Medical History:  Diagnosis Date   Acid reflux    Anxiety    Asthma    Degenerative disc disease    Depression    Diabetes mellitus    Hypertension    Hypoglycemia 12/30/2016   Stroke ([place])    04/22/12   Past Surgical History:  Procedure Laterality Date   CARDIAC CATHETERIZATION N/A 11/21/2015   Procedure: Left Heart Cath and Coronary Angiography;  Surgeon: Peter M Martinique, MD;  Location: [place] [facility];  Service: Cardiovascular;  Laterality: N/A;   CARDIAC CATHETERIZATION N/A 11/21/2015   Procedure: Coronary Stent Intervention;  Surgeon: Peter M Martinique, MD;  Location: [place] [facility];  Service: Cardiovascular;  Laterality: N/A;   KNEE SURGERY     TUBAL LIGATION      Allergies  No Known Allergies  History of Present Illness    ***  Home Medications    Current Outpatient Medications  Medication Sig Dispense Refill   ACCU-CHEK AVIVA PLUS test strip 1 each as needed.  5   albuterol (PROVENTIL HFA;VENTOLIN HFA) 108 (90 Base) MCG/ACT inhaler Inhale 2 puffs into the lungs every 6 (six) hours as needed for wheezing or shortness of breath.     amLODipine  (NORVASC) 5 MG tablet Take 5 mg by mouth daily.     atorvastatin (LIPITOR) 80 MG tablet TAKE 1 TABLET EVERY DAY AT 6 PM 15 tablet 0   B-D ULTRAFINE III SHORT PEN 31G X 8 MM MISC 1 each as needed.  11   buPROPion (WELLBUTRIN SR) 150 MG 12 hr tablet Take 150 mg by mouth daily.     carvedilol (COREG) 12.5 MG tablet TAKE 1 TABLET (12.5 MG TOTAL) BY MOUTH 2 (TWO) TIMES DAILY WITH A MEAL. 60 tablet 10   cilostazol (PLETAL) 50 MG tablet Take 1 tablet (50 mg total) by mouth 2 (two) times daily. 180 tablet 2   cloNIDine (CATAPRES) 0.1 MG tablet Take 0.1 mg by mouth daily as needed (sleep).      Cyanocobalamin (B-12) 1000 MCG CAPS Take 1,000 mcg by mouth daily.      cyclobenzaprine (FLEXERIL) 10 MG tablet Take 10 mg by mouth 3 (three) times daily.     diclofenac sodium (VOLTAREN) 1 % GEL Apply 4 g topically 4 (four) times daily. 1 Tube 0   diclofenac Sodium (VOLTAREN) 1 % GEL Apply 1 application topically 4 (four) times daily as needed for pain.     furosemide (LASIX) 20 MG tablet Take 1 tablet (20 mg total) by mouth daily. 3 tablet 0   gabapentin (NEURONTIN) 300 MG capsule Take 300 mg by mouth 3 (three) times daily.  Glycerin, Adult, 2.1 g SUPP Place 1 suppository rectally daily as needed for moderate constipation. 20 suppository 0   JARDIANCE 10 MG TABS tablet Take 10 mg by mouth daily.     LANTUS SOLOSTAR 100 UNIT/ML Solostar Pen Inject 50 Units into the skin in the morning and at bedtime.     metFORMIN (GLUCOPHAGE) 1000 MG tablet Take 1,000 mg by mouth 2 (two) times daily.     metFORMIN (GLUMETZA) 1000 MG (MOD) 24 hr tablet Take 1 tablet (1,000 mg total) by mouth 2 (two) times daily with a meal. Restart tomorrow     metroNIDAZOLE (FLAGYL) 500 MG tablet Take 1 tablet (500 mg total) by mouth 2 (two) times daily. 14 tablet 0   MOVANTIK 25 MG TABS tablet Take 25 mg by mouth daily before breakfast.   5   ondansetron (ZOFRAN ODT) 4 MG disintegrating tablet Take 1 tablet (4 mg total) by mouth every 6  (six) hours as needed. 20 tablet 0   oxyCODONE-acetaminophen (PERCOCET) 7.5-325 MG tablet Take 1 tablet by mouth every 4 (four) hours as needed for severe pain.     polyethylene glycol (MIRALAX / GLYCOLAX) packet Take 17 g by mouth daily as needed. 14 each 0   potassium chloride (KLOR-CON) 10 MEQ tablet Take 1 tablet (10 mEq total) by mouth daily. 3 tablet 0   traZODone (DESYREL) 50 MG tablet Take 50 mg by mouth at bedtime as needed for sleep.      Vitamin D, Ergocalciferol, (DRISDOL) 50000 units CAPS capsule Take 50,000 Units by mouth every 7 (seven) days. monday     No current facility-administered medications for this visit.     Family History    Family History  Problem Relation Age of Onset   Hypertension Other    She indicated that her mother is deceased. She indicated that her father is deceased. She indicated that the status of her other is unknown.  Social History    Social History   Socioeconomic History   Marital status: Divorced    Spouse name: Not on file   Number of children: Not on file   Years of education: Not on file   Highest education level: Not on file  Occupational History   Not on file  Tobacco Use   Smoking status: Every Day    Packs/day: 0.25    Years: 45.00    Total pack years: 11.25    Types: Cigarettes   Smokeless tobacco: Never  Vaping Use   Vaping Use: Never used  Substance and Sexual Activity   Alcohol use: No   Drug use: No   Sexual activity: Never  Other Topics Concern   Not on file  Social History Narrative   Not on file   Social Determinants of Health   Financial Resource Strain: Not on file  Food Insecurity: Not on file  Transportation Needs: Not on file  Physical Activity: Not on file  Stress: Not on file  Social Connections: Not on file  Intimate Partner Violence: Not on file     Review of Systems    General:  No chills, fever, night sweats or weight changes.  Cardiovascular:  No chest pain, dyspnea on exertion, edema,  orthopnea, palpitations, paroxysmal nocturnal dyspnea. Dermatological: No rash, lesions/masses Respiratory: No cough, dyspnea Urologic: No hematuria, dysuria Abdominal:   No nausea, vomiting, diarrhea, bright red blood per rectum, melena, or hematemesis Neurologic:  No visual changes, wkns, changes in mental status. All other systems reviewed and are  otherwise negative except as noted above.     Physical Exam    VS:  There were no vitals taken for this visit. , BMI There is no height or weight on file to calculate BMI.     GEN: Well nourished, well developed, in no acute distress. HEENT: normal. Neck: Supple, no JVD, carotid bruits, or masses. Cardiac: RRR, no murmurs, rubs, or gallops. No clubbing, cyanosis, edema.  Radials/DP/PT 2+ and equal bilaterally.  Respiratory:  Respirations regular and unlabored, clear to auscultation bilaterally. GI: Soft, nontender, nondistended, BS + x 4. MS: no deformity or atrophy. Skin: warm and dry, no rash. Neuro:  Strength and sensation are intact. Psych: Normal affect.  Accessory Clinical Findings    ECG personally reviewed by me today- *** - No acute changes  Lab Results  Component Value Date   WBC 6.9 09/05/2019   HGB 12.0 09/05/2019   HCT 40.3 09/05/2019   MCV 93.1 09/05/2019   PLT 255 09/05/2019   Lab Results  Component Value Date   CREATININE 0.84 07/17/2021   BUN 12 07/17/2021   NA 143 07/17/2021   K 3.8 07/17/2021   CL 105 07/17/2021   CO2 30 07/17/2021   Lab Results  Component Value Date   ALT 8 03/08/2019   AST 10 03/08/2019   ALKPHOS 165 (H) 03/08/2019   BILITOT 0.4 03/08/2019   Lab Results  Component Value Date   CHOL 138 07/17/2021   HDL 39 (L) 07/17/2021   LDLCALC 82 07/17/2021   TRIG 90 07/17/2021   CHOLHDL 3.5 07/17/2021    Lab Results  Component Value Date   HGBA1C 9.1 (H) 12/30/2016    Review of Prior Studies: Doppler studies with ABI on 02/18/2022  Right: Total occlusion noted in the superficial  femoral artery.  Atherosclerosis noted throughout extremity.   Left: 50-74% stenosis noted in the superficial femoral artery.  Atherosclerosis noted throughout extremity.   NM Stress Test 08/13/2021 The study is normal. The study is low risk.   No ST deviation was noted.   LV perfusion is normal.   Left ventricular function is normal. Nuclear stress EF: 67 %. The left ventricular ejection fraction is hyperdynamic (>65%). End diastolic cavity size is normal.   Prior study not available for comparison.   Assessment & Plan   1.  ***    Current medicines are reviewed at length with the patient today.  I have spent *** min's  dedicated to the care of this patient on the date of this encounter to include pre-visit review of records, assessment, management and diagnostic testing,with shared decision making. Signed, Phill Myron. West Pugh, ANP, [place]   05/21/2022 1:55 PM      Office [phone] Fax [phone]  Notice: This dictation was prepared with Dragon dictation along with smaller phrase technology. Any transcriptional errors that result from this process are unintentional and may not be corrected upon review.

## 2022-05-23 ENCOUNTER — Other Ambulatory Visit: Payer: Self-pay | Admitting: Internal Medicine

## 2022-05-23 ENCOUNTER — Ambulatory Visit: Payer: 59 | Admitting: Adult Health

## 2022-05-23 ENCOUNTER — Encounter (HOSPITAL_COMMUNITY): Payer: Self-pay

## 2022-05-23 ENCOUNTER — Ambulatory Visit (HOSPITAL_COMMUNITY)
Admission: EM | Admit: 2022-05-23 | Discharge: 2022-05-23 | Disposition: A | Discharge status: Home / Self Care | Payer: 59 | Attending: Physician Assistant | Admitting: Physician Assistant

## 2022-05-23 DIAGNOSIS — N3001 Acute cystitis with hematuria: Secondary | ICD-10-CM

## 2022-05-23 LAB — POCT URINALYSIS DIPSTICK, ED / UC
Bilirubin Urine: NEGATIVE
Glucose, UA: 500 mg/dL — AB
Ketones, ur: NEGATIVE mg/dL
Leukocytes,Ua: NEGATIVE
Nitrite: POSITIVE — AB
Protein, ur: NEGATIVE mg/dL
Specific Gravity, Urine: 1.01 (ref 1.005–1.030)
Urobilinogen, UA: 0.2 mg/dL (ref 0.0–1.0)
pH: 5.5 (ref 5.0–8.0)

## 2022-05-23 MED ORDER — SULFAMETHOXAZOLE-TRIMETHOPRIM 800-160 MG PO TABS: 1.0000 | ORAL_TABLET | Freq: Two times a day (BID) | ORAL | 0 refills | Status: AC

## 2022-05-23 NOTE — ED Provider Notes (Signed)
[place]    CSN: [id number] Arrival date & time: 05/23/22  1713      History   Chief Complaint Chief Complaint  Patient presents with   Back Pain    HPI JENAVEVE SKYBERG is a 67 y.o. female.   Patient presents today with a 2-day history of UTI symptoms including dysuria and lower back pain.  She does have a history of UTI with similar presentation.  Was last seen 04/14/2022 with similar symptoms at which point she was given antibiotics (Keflex) and treated for yeast/BV.  She reports resolution of symptoms until recurrence a few days ago.  Denies any history of nephrolithiasis, single kidney, self-catheterization, recent urogenital procedure.  She has not seen a urologist in the past.  She does have a history of diabetes and does take SGLT2 inhibitor.  Denies any fever, nausea, vomiting, vaginal discharge, pelvic pain.    Past Medical History:  Diagnosis Date   Acid reflux    Anxiety    Asthma    Degenerative disc disease    Depression    Diabetes mellitus    Hypertension    Hypoglycemia 12/30/2016   Stroke ([place])    04/22/12    Patient Active Problem List   Diagnosis Date Noted   Peripheral arterial disease ([place]) 02/18/2022   Unilateral primary osteoarthritis, right knee 03/27/2021   Unilateral primary osteoarthritis, left knee 03/27/2021   Acute right-sided low back pain without sciatica    Peripheral neuropathy 02/08/2018   Acute pancreatitis 02/07/2018   CAD S/P percutaneous coronary angioplasty 01/29/2017   Tobacco abuse 01/29/2017   Chronic back pain 01/29/2017   Hypoglycemia 12/30/2016   Insulin dependent diabetes mellitus 11/21/2015   Dyslipidemia 11/21/2015   NSTEMI (non-ST elevated myocardial infarction) [facility])    TIA (transient ischemic attack) 05/31/2014   History of stroke 04/22/2012   Hypokalemia 04/22/2012   Diabetes mellitus ([place]) 04/22/2012   Essential hypertension 04/22/2012    Past Surgical History:  Procedure Laterality Date    CARDIAC CATHETERIZATION N/A 11/21/2015   Procedure: Left Heart Cath and Coronary Angiography;  Surgeon: Peter M Martinique, MD;  Location: [place] [facility];  Service: Cardiovascular;  Laterality: N/A;   CARDIAC CATHETERIZATION N/A 11/21/2015   Procedure: Coronary Stent Intervention;  Surgeon: Peter M Martinique, MD;  Location: [place] [facility];  Service: Cardiovascular;  Laterality: N/A;   KNEE SURGERY     TUBAL LIGATION      OB History   No obstetric history on file.      Home Medications    Prior to Admission medications   Medication Sig Start Date End Date Taking? Authorizing Provider  sulfamethoxazole-trimethoprim (BACTRIM DS) 800-160 MG tablet Take 1 tablet by mouth 2 (two) times daily for 5 days. 05/23/22 05/28/22 Yes Azriella Mattia K, PA-C  ACCU-CHEK AVIVA PLUS test strip 1 each as needed. 10/14/16   [provider]  albuterol (PROVENTIL HFA;VENTOLIN HFA) 108 (90 Base) MCG/ACT inhaler Inhale 2 puffs into the lungs every 6 (six) hours as needed for wheezing or shortness of breath.    [provider]  amLODipine (NORVASC) 5 MG tablet Take 5 mg by mouth daily. 08/14/19   [provider]  atorvastatin (LIPITOR) 80 MG tablet TAKE 1 TABLET EVERY DAY AT 6 PM 06/21/20   Martinique, Peter M, MD  B-D ULTRAFINE III SHORT PEN 31G X 8 MM MISC 1 each as needed. 11/18/16   [provider]  buPROPion (WELLBUTRIN SR) 150 MG 12 hr tablet  Take 150 mg by mouth daily. 02/09/22   [provider]  carvedilol (COREG) 12.5 MG tablet TAKE 1 TABLET (12.5 MG TOTAL) BY MOUTH 2 (TWO) TIMES DAILY WITH A MEAL. 06/24/21   Lorretta Harp, MD  cilostazol (PLETAL) 50 MG tablet Take 1 tablet (50 mg total) by mouth 2 (two) times daily. 02/18/22   Lorretta Harp, MD  cloNIDine (CATAPRES) 0.1 MG tablet Take 0.1 mg by mouth daily as needed (sleep).  08/14/19   [provider]  clopidogrel (PLAVIX) 75 MG tablet TAKE 1 TABLET (75 MG TOTAL) BY MOUTH DAILY. NEEDS APPOINTMENT WITH DR Gwenlyn Found FOR  FUTURE REFILLS 05/21/22   Lorretta Harp, MD  Cyanocobalamin (B-12) 1000 MCG CAPS Take 1,000 mcg by mouth daily.     [provider]  cyclobenzaprine (FLEXERIL) 10 MG tablet Take 10 mg by mouth 3 (three) times daily. 01/31/22   [provider]  diclofenac sodium (VOLTAREN) 1 % GEL Apply 4 g topically 4 (four) times daily. 02/11/18   Debbe Odea, MD  diclofenac Sodium (VOLTAREN) 1 % GEL Apply 1 application topically 4 (four) times daily as needed for pain. 08/31/19   [provider]  furosemide (LASIX) 20 MG tablet Take 1 tablet (20 mg total) by mouth daily. 09/05/19   Lacretia Leigh, MD  gabapentin (NEURONTIN) 300 MG capsule Take 300 mg by mouth 3 (three) times daily.     [provider]  Glycerin, Adult, 2.1 g SUPP Place 1 suppository rectally daily as needed for moderate constipation. 02/11/18   Debbe Odea, MD  JARDIANCE 10 MG TABS tablet Take 10 mg by mouth daily. 01/13/22   [provider]  LANTUS SOLOSTAR 100 UNIT/ML Solostar Pen Inject 50 Units into the skin in the morning and at bedtime. 08/15/19   [provider]  metFORMIN (GLUCOPHAGE) 1000 MG tablet Take 1,000 mg by mouth 2 (two) times daily. 08/14/19   [provider]  metFORMIN (GLUMETZA) 1000 MG (MOD) 24 hr tablet Take 1 tablet (1,000 mg total) by mouth 2 (two) times daily with a meal. Restart tomorrow 11/23/15   Domenic Polite, MD  metroNIDAZOLE (FLAGYL) 500 MG tablet Take 1 tablet (500 mg total) by mouth 2 (two) times daily. 04/16/22   LampteyMyrene Galas, MD  MOVANTIK 25 MG TABS tablet Take 25 mg by mouth daily before breakfast.  12/12/16   [provider]  ondansetron (ZOFRAN ODT) 4 MG disintegrating tablet Take 1 tablet (4 mg total) by mouth every 6 (six) hours as needed. 02/05/18   Ward, Delice Bison, DO  oxyCODONE-acetaminophen (PERCOCET) 7.5-325 MG tablet Take 1 tablet by mouth every 4 (four) hours as needed for severe pain.    [provider]  polyethylene  glycol (MIRALAX / GLYCOLAX) packet Take 17 g by mouth daily as needed. 02/11/18   Debbe Odea, MD  potassium chloride (KLOR-CON) 10 MEQ tablet Take 1 tablet (10 mEq total) by mouth daily. 09/05/19   Lacretia Leigh, MD  traZODone (DESYREL) 50 MG tablet Take 50 mg by mouth at bedtime as needed for sleep.     [provider]  Vitamin D, Ergocalciferol, (DRISDOL) 50000 units CAPS capsule Take 50,000 Units by mouth every 7 (seven) days. monday    [provider]    Family History Family History  Problem Relation Age of Onset   Hypertension Other     Social History Social History   Tobacco Use   Smoking status: Every Day    Packs/day: 0.25  Years: 45.00    Total pack years: 11.25    Types: Cigarettes   Smokeless tobacco: Never  Vaping Use   Vaping Use: Never used  Substance Use Topics   Alcohol use: No   Drug use: No     Allergies   Patient has no known allergies.   Review of Systems Review of Systems  Constitutional:  Positive for activity change. Negative for appetite change, fatigue and fever.  Gastrointestinal:  Negative for abdominal pain, diarrhea, nausea and vomiting.  Genitourinary:  Positive for dysuria and frequency. Negative for urgency, vaginal bleeding, vaginal discharge and vaginal pain.  Musculoskeletal:  Positive for back pain. Negative for arthralgias and myalgias.     Physical Exam Triage Vital Signs ED Triage Vitals  Enc Vitals Group     BP 05/23/22 1738 135/75     Pulse Rate 05/23/22 1738 88     Resp 05/23/22 1738 16     Temp 05/23/22 1738 98.1 F (36.7 C)     Temp Source 05/23/22 1738 Oral     SpO2 05/23/22 1738 94 %     Weight --      Height --      Head Circumference --      Peak Flow --      Pain Score 05/23/22 1741 8     Pain Loc --      Pain Edu? --      Excl. in [place]? --    No data found.  Updated Vital Signs BP 135/75 (BP Location: Left Arm)   Pulse 88   Temp 98.1 F (36.7 C) (Oral)   Resp 16   SpO2 94%    Visual Acuity Right Eye Distance:   Left Eye Distance:   Bilateral Distance:    Right Eye Near:   Left Eye Near:    Bilateral Near:     Physical Exam Vitals reviewed.  Constitutional:      General: She is awake. She is not in acute distress.    Appearance: Normal appearance. She is well-developed. She is not ill-appearing.     Comments: Very pleasant female appears stated age in no acute distress sitting comfortably in exam room  HENT:     Head: Normocephalic and atraumatic.  Cardiovascular:     Rate and Rhythm: Normal rate and regular rhythm.     Heart sounds: Normal heart sounds, S1 normal and S2 normal. No murmur heard. Pulmonary:     Effort: Pulmonary effort is normal.     Breath sounds: Normal breath sounds. No wheezing, rhonchi or rales.     Comments: Clear to auscultation bilaterally Abdominal:     General: Bowel sounds are normal.     Palpations: Abdomen is soft.     Tenderness: There is no abdominal tenderness. There is no right CVA tenderness, left CVA tenderness, guarding or rebound.     Comments: Benign abdominal exam  Musculoskeletal:     Right lower leg: No edema.     Left lower leg: No edema.  Psychiatric:        Behavior: Behavior is cooperative.      UC Treatments / Results  Labs (all labs ordered are listed, but only abnormal results are displayed) Labs Reviewed  POCT URINALYSIS DIPSTICK, ED / UC - Abnormal; Notable for the following components:      Result Value   Glucose, UA 500 (*)    Hgb urine dipstick TRACE (*)    Nitrite POSITIVE (*)  All other components within normal limits    EKG   Radiology No results found.  Procedures Procedures (including critical care time)  Medications Ordered in UC Medications - No data to display  Initial Impression / Assessment and Plan / UC Course  I have reviewed the triage vital signs and the nursing notes.  Pertinent labs & imaging results that were available during my care of the patient  were reviewed by me and considered in my medical decision making (see chart for details).     Patient is well-appearing, afebrile, nontoxic, nontachycardic.  UA did show that close which is expected given her SGLT2 inhibitor use.  She also had positive nitrites and trace hemoglobin.  Given these abnormal UA findings with symptoms will cover for UTI with Bactrim DS.  No indication for dose adjustment based on metabolic panel from [id number] with creatinine of 0.84 and calculated creatinine clearance of 107.58 mL/min.  Urine culture was obtained and we discussed potential need to change or stop antibiotics based on culture results.  She is to rest and drink plenty of fluid.  Recommend that she follow-up with her primary care within a few weeks in order to have this repeated to ensure that trace hemoglobin noted today resolved with clearing of infection.  Discussed that if she has any worsening symptoms including abdominal pain, pelvic pain, fever, nausea, vomiting she should be seen immediately.  Strict return precautions given.    Final Clinical Impressions(s) / UC Diagnoses   Final diagnoses:  Acute cystitis with hematuria     Discharge Instructions      It appears you have a urinary tract infection.  Please start sulfamethoxazole/trimethoprim twice daily for 5 days.  Make sure you rest and drink plenty of fluid.  We will contact you if we need to arrange different treatment based on your culture results.  Follow-up with your primary care for repeat urine within 2 to 4 weeks.  If you have any worsening or changing symptoms including abdominal pain, pelvic pain, fever, nausea, vomiting you should be seen immediately.     ED Prescriptions     Medication Sig Dispense Auth. Provider   sulfamethoxazole-trimethoprim (BACTRIM DS) 800-160 MG tablet Take 1 tablet by mouth 2 (two) times daily for 5 days. 10 tablet Eric Morganti, Derry Skill, PA-C      PDMP not reviewed this encounter.   Terrilee Croak,  PA-C 05/23/22 [id number]

## 2022-05-23 NOTE — ED Triage Notes (Signed)
Pt states right lower back pain and burning with urination for the past 2 days.

## 2022-05-23 NOTE — Discharge Instructions (Signed)
It appears you have a urinary tract infection.  Please start sulfamethoxazole/trimethoprim twice daily for 5 days.  Make sure you rest and drink plenty of fluid.  We will contact you if we need to arrange different treatment based on your culture results.  Follow-up with your primary care for repeat urine within 2 to 4 weeks.  If you have any worsening or changing symptoms including abdominal pain, pelvic pain, fever, nausea, vomiting you should be seen immediately.

## 2022-05-24 LAB — CBC
HCT: 41.8 % (ref 35.0–45.0)
Hemoglobin: 13.6 g/dL (ref 11.7–15.5)
MCH: 27.7 pg (ref 27.0–33.0)
MCHC: 32.5 g/dL (ref 32.0–36.0)
MCV: 85.1 fL (ref 80.0–100.0)
MPV: 10.5 fL (ref 7.5–12.5)
Platelets: 324 10*3/uL (ref 140–400)
RBC: 4.91 10*6/uL (ref 3.80–5.10)
RDW: 13 % (ref 11.0–15.0)
WBC: 6.4 10*3/uL (ref 3.8–10.8)

## 2022-05-24 LAB — LIPID PANEL
Cholesterol: 149 mg/dL (ref <200)
HDL: 41 mg/dL — ABNORMAL LOW (ref >=50)
LDL Cholesterol (Calc): 91 mg/dL (calc)
Non-HDL Cholesterol (Calc): 108 mg/dL (calc) (ref <130)
Total CHOL/HDL Ratio: 3.6 (calc) (ref <5.0)
Triglycerides: 79 mg/dL (ref <150)

## 2022-05-24 LAB — COMPLETE METABOLIC PANEL WITH GFR
AG Ratio: 1.7 (calc) (ref 1.0–2.5)
ALT: 9 U/L (ref 6–29)
AST: 9 U/L — ABNORMAL LOW (ref 10–35)
Albumin: 4.2 g/dL (ref 3.6–5.1)
Alkaline phosphatase (APISO): 125 U/L (ref 37–153)
BUN: 12 mg/dL (ref 7–25)
CO2: 24 mmol/L (ref 20–32)
Calcium: 9.4 mg/dL (ref 8.6–10.4)
Chloride: 108 mmol/L (ref 98–110)
Creat: 0.93 mg/dL (ref 0.50–1.05)
Globulin: 2.5 g/dL (calc) (ref 1.9–3.7)
Glucose, Bld: 143 mg/dL — ABNORMAL HIGH (ref 65–99)
Potassium: 4.3 mmol/L (ref 3.5–5.3)
Sodium: 147 mmol/L — ABNORMAL HIGH (ref 135–146)
Total Bilirubin: 0.4 mg/dL (ref 0.2–1.2)
Total Protein: 6.7 g/dL (ref 6.1–8.1)
eGFR: 68 mL/min/{1.73_m2} (ref >=60)

## 2022-05-24 LAB — TSH: TSH: 4.67 mIU/L — ABNORMAL HIGH (ref 0.40–4.50)

## 2022-05-24 LAB — VITAMIN D 25 HYDROXY (VIT D DEFICIENCY, FRACTURES): Vit D, 25-Hydroxy: 109 ng/mL — ABNORMAL HIGH (ref 30–100)

## 2022-05-28 ENCOUNTER — Ambulatory Visit: Payer: 59 | Admitting: Podiatry

## 2022-05-28 NOTE — Progress Notes (Deleted)
Cardiology Clinic Note   Patient Name: Miranda [name] Date of Encounter: 05/28/2022  Primary Care Provider:  Nolene Ebbs, MD Primary Cardiologist:  Quay Burow, MD  Patient Profile    67 year old female with history of HTN, Diabetes, HL, ongoing tobacco abuse,CVA, CAD with LHC by Dr. Martinique revealing 90% proximal AV groove circumflex 60% distal LAD lesion. Her circumflex was stented with a drug-eluting stent; lower extremity arterial Doppler studies performed today that showed a right ABI of 0.54 and a left of 0.70. She had a short segment occlusion mid right SFA with tibial vessel disease and high-grade segmental mid left SFA stenosis. Dr. Kennon Holter assessment is that she would have durable result with endovascular therapy, and therefore she was started on Pletal 50 mg BID. She is here for follow up.   Past Medical History    Past Medical History:  Diagnosis Date   Acid reflux    Anxiety    Asthma    Degenerative disc disease    Depression    Diabetes mellitus    Hypertension    Hypoglycemia 12/30/2016   Stroke ([place])    04/22/12   Past Surgical History:  Procedure Laterality Date   CARDIAC CATHETERIZATION N/A 11/21/2015   Procedure: Left Heart Cath and Coronary Angiography;  Surgeon: Peter M Martinique, MD;  Location: [place] [facility];  Service: Cardiovascular;  Laterality: N/A;   CARDIAC CATHETERIZATION N/A 11/21/2015   Procedure: Coronary Stent Intervention;  Surgeon: Peter M Martinique, MD;  Location: [place] [facility];  Service: Cardiovascular;  Laterality: N/A;   KNEE SURGERY     TUBAL LIGATION      Allergies  No Known Allergies  History of Present Illness    ***  Home Medications    Current Outpatient Medications  Medication Sig Dispense Refill   ACCU-CHEK AVIVA PLUS test strip 1 each as needed.  5   albuterol (PROVENTIL HFA;VENTOLIN HFA) 108 (90 Base) MCG/ACT inhaler Inhale 2 puffs into the lungs every 6 (six) hours as needed for wheezing or shortness of breath.      amLODipine (NORVASC) 5 MG tablet Take 5 mg by mouth daily.     atorvastatin (LIPITOR) 80 MG tablet TAKE 1 TABLET EVERY DAY AT 6 PM 15 tablet 0   B-D ULTRAFINE III SHORT PEN 31G X 8 MM MISC 1 each as needed.  11   buPROPion (WELLBUTRIN SR) 150 MG 12 hr tablet Take 150 mg by mouth daily.     carvedilol (COREG) 12.5 MG tablet TAKE 1 TABLET (12.5 MG TOTAL) BY MOUTH 2 (TWO) TIMES DAILY WITH A MEAL. 60 tablet 10   cilostazol (PLETAL) 50 MG tablet Take 1 tablet (50 mg total) by mouth 2 (two) times daily. 180 tablet 2   cloNIDine (CATAPRES) 0.1 MG tablet Take 0.1 mg by mouth daily as needed (sleep).      clopidogrel (PLAVIX) 75 MG tablet TAKE 1 TABLET (75 MG TOTAL) BY MOUTH DAILY. NEEDS APPOINTMENT WITH DR Gwenlyn Found FOR FUTURE REFILLS 60 tablet 0   Cyanocobalamin (B-12) 1000 MCG CAPS Take 1,000 mcg by mouth daily.      cyclobenzaprine (FLEXERIL) 10 MG tablet Take 10 mg by mouth 3 (three) times daily.     diclofenac sodium (VOLTAREN) 1 % GEL Apply 4 g topically 4 (four) times daily. 1 Tube 0   diclofenac Sodium (VOLTAREN) 1 % GEL Apply 1 application topically 4 (four) times daily as needed for pain.     furosemide (LASIX) 20 MG  tablet Take 1 tablet (20 mg total) by mouth daily. 3 tablet 0   gabapentin (NEURONTIN) 300 MG capsule Take 300 mg by mouth 3 (three) times daily.      Glycerin, Adult, 2.1 g SUPP Place 1 suppository rectally daily as needed for moderate constipation. 20 suppository 0   JARDIANCE 10 MG TABS tablet Take 10 mg by mouth daily.     LANTUS SOLOSTAR 100 UNIT/ML Solostar Pen Inject 50 Units into the skin in the morning and at bedtime.     metFORMIN (GLUCOPHAGE) 1000 MG tablet Take 1,000 mg by mouth 2 (two) times daily.     metFORMIN (GLUMETZA) 1000 MG (MOD) 24 hr tablet Take 1 tablet (1,000 mg total) by mouth 2 (two) times daily with a meal. Restart tomorrow     metroNIDAZOLE (FLAGYL) 500 MG tablet Take 1 tablet (500 mg total) by mouth 2 (two) times daily. 14 tablet 0   MOVANTIK 25 MG TABS  tablet Take 25 mg by mouth daily before breakfast.   5   ondansetron (ZOFRAN ODT) 4 MG disintegrating tablet Take 1 tablet (4 mg total) by mouth every 6 (six) hours as needed. 20 tablet 0   oxyCODONE-acetaminophen (PERCOCET) 7.5-325 MG tablet Take 1 tablet by mouth every 4 (four) hours as needed for severe pain.     polyethylene glycol (MIRALAX / GLYCOLAX) packet Take 17 g by mouth daily as needed. 14 each 0   potassium chloride (KLOR-CON) 10 MEQ tablet Take 1 tablet (10 mEq total) by mouth daily. 3 tablet 0   sulfamethoxazole-trimethoprim (BACTRIM DS) 800-160 MG tablet Take 1 tablet by mouth 2 (two) times daily for 5 days. 10 tablet 0   traZODone (DESYREL) 50 MG tablet Take 50 mg by mouth at bedtime as needed for sleep.      Vitamin D, Ergocalciferol, (DRISDOL) 50000 units CAPS capsule Take 50,000 Units by mouth every 7 (seven) days. monday     No current facility-administered medications for this visit.     Family History    Family History  Problem Relation Age of Onset   Hypertension Other    She indicated that her mother is deceased. She indicated that her father is deceased. She indicated that the status of her other is unknown.  Social History    Social History   Socioeconomic History   Marital status: Divorced    Spouse name: Not on file   Number of children: Not on file   Years of education: Not on file   Highest education level: Not on file  Occupational History   Not on file  Tobacco Use   Smoking status: Every Day    Packs/day: 0.25    Years: 45.00    Total pack years: 11.25    Types: Cigarettes   Smokeless tobacco: Never  Vaping Use   Vaping Use: Never used  Substance and Sexual Activity   Alcohol use: No   Drug use: No   Sexual activity: Never  Other Topics Concern   Not on file  Social History Narrative   Not on file   Social Determinants of Health   Financial Resource Strain: Not on file  Food Insecurity: Not on file  Transportation Needs: Not on  file  Physical Activity: Not on file  Stress: Not on file  Social Connections: Not on file  Intimate Partner Violence: Not on file     Review of Systems    General:  No chills, fever, night sweats or weight changes.  Cardiovascular:  No chest pain, dyspnea on exertion, edema, orthopnea, palpitations, paroxysmal nocturnal dyspnea. Dermatological: No rash, lesions/masses Respiratory: No cough, dyspnea Urologic: No hematuria, dysuria Abdominal:   No nausea, vomiting, diarrhea, bright red blood per rectum, melena, or hematemesis Neurologic:  No visual changes, wkns, changes in mental status. All other systems reviewed and are otherwise negative except as noted above.     Physical Exam    VS:  There were no vitals taken for this visit. , BMI There is no height or weight on file to calculate BMI.     GEN: Well nourished, well developed, in no acute distress. HEENT: normal. Neck: Supple, no JVD, carotid bruits, or masses. Cardiac: RRR, no murmurs, rubs, or gallops. No clubbing, cyanosis, edema.  Radials/DP/PT 2+ and equal bilaterally.  Respiratory:  Respirations regular and unlabored, clear to auscultation bilaterally. GI: Soft, nontender, nondistended, BS + x 4. MS: no deformity or atrophy. Skin: warm and dry, no rash. Neuro:  Strength and sensation are intact. Psych: Normal affect.  Accessory Clinical Findings    ECG personally reviewed by me today- *** - No acute changes  Lab Results  Component Value Date   WBC 6.4 05/23/2022   HGB 13.6 05/23/2022   HCT 41.8 05/23/2022   MCV 85.1 05/23/2022   PLT 324 05/23/2022   Lab Results  Component Value Date   CREATININE 0.93 05/23/2022   BUN 12 05/23/2022   NA 147 (H) 05/23/2022   K 4.3 05/23/2022   CL 108 05/23/2022   CO2 24 05/23/2022   Lab Results  Component Value Date   ALT 9 05/23/2022   AST 9 (L) 05/23/2022   ALKPHOS 165 (H) 03/08/2019   BILITOT 0.4 05/23/2022   Lab Results  Component Value Date   CHOL 149  05/23/2022   HDL 41 (L) 05/23/2022   LDLCALC 91 05/23/2022   TRIG 79 05/23/2022   CHOLHDL 3.6 05/23/2022    Lab Results  Component Value Date   HGBA1C 9.1 (H) 12/30/2016    Review of Prior Studies: Doppler US of Lower Extremities 02/18/2022 Right: Total occlusion noted in the superficial femoral artery.  Atherosclerosis noted throughout extremity.   Left: 50-74% stenosis noted in the superficial femoral artery.  Atherosclerosis noted throughout extremity.   NM Stress Test 08/13/2021  The study is normal. The study is low risk.   No ST deviation was noted.   LV perfusion is normal.   Left ventricular function is normal. Nuclear stress EF: 67 %. The left ventricular ejection fraction is hyperdynamic (>65%). End diastolic cavity size is normal.   Prior study not available for comparison.  Assessment & Plan   1.  ***    Current medicines are reviewed at length with the patient today.  I have spent *** min's  dedicated to the care of this patient on the date of this encounter to include pre-visit review of records, assessment, management and diagnostic testing,with shared decision making. Signed, Phill Myron. West Pugh, ANP, AACC   05/28/2022 5:15 PM      Office [phone] Fax [phone]  Notice: This dictation was prepared with Dragon dictation along with smaller phrase technology. Any transcriptional errors that result from this process are unintentional and may not be corrected upon review.

## 2022-05-30 ENCOUNTER — Ambulatory Visit: Payer: 59 | Admitting: Adult Health

## 2022-06-02 ENCOUNTER — Ambulatory Visit: Payer: 59 | Admitting: Podiatry

## 2022-06-11 ENCOUNTER — Ambulatory Visit: Payer: 59 | Admitting: Orthopaedic Surgery

## 2022-06-17 ENCOUNTER — Ambulatory Visit
Admission: RE | Admit: 2022-06-17 | Discharge: 2022-06-17 | Disposition: A | Discharge status: Home / Self Care | Payer: 59 | Source: Ambulatory Visit | Attending: Nurse Practitioner | Admitting: Nurse Practitioner

## 2022-06-17 DIAGNOSIS — Z1231 Encounter for screening mammogram for malignant neoplasm of breast: Secondary | ICD-10-CM

## 2022-07-07 ENCOUNTER — Ambulatory Visit: Payer: 59 | Admitting: Orthopaedic Surgery

## 2022-07-14 ENCOUNTER — Encounter: Payer: Self-pay | Admitting: Orthopaedic Surgery

## 2022-07-14 ENCOUNTER — Ambulatory Visit (INDEPENDENT_AMBULATORY_CARE_PROVIDER_SITE_OTHER): Payer: 59 | Admitting: Orthopaedic Surgery

## 2022-07-14 ENCOUNTER — Other Ambulatory Visit (INDEPENDENT_AMBULATORY_CARE_PROVIDER_SITE_OTHER): Payer: 59

## 2022-07-14 VITALS — Ht 70.0 in | Wt 225.0 lb

## 2022-07-14 DIAGNOSIS — G8929 Other chronic pain: Secondary | ICD-10-CM

## 2022-07-14 DIAGNOSIS — M25561 Pain in right knee: Secondary | ICD-10-CM | POA: Diagnosis not present

## 2022-07-14 DIAGNOSIS — M25562 Pain in left knee: Secondary | ICD-10-CM

## 2022-07-14 NOTE — Progress Notes (Signed)
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  She does ambulate the cane.  She is 67 years old.  She has had multiple injections in her knees in the past.  At this point her bilateral knee pain is daily and it is detrimentally affecting her mobility, her quality of life, and her actives daily living.  She would like to consider knee replacement surgery.  She is followed closely by Dr. Quay Burow who is her cardiologist.  She is on Plavix.  She has significant peripheral vascular disease and I saw from his last note he feels that the PAD is not the main contributing factor of her knee pain and leg pain and I agree based on the severity of arthritis.  She is a diabetic and she reports her blood glucose has not been under good control.  I cannot find an A1c on the system.  She says it has been over 8.  She is a patient of Dr. Nolene Ebbs.  She is on chronic narcotic pain medication as well.  On exam both knees have significant varus malalignment and patellofemoral crepitation.  Both knees have limited range of motion which is painful.  Both knees have tenderness in all 3 compartments.  X-rays of both knees show severe end-stage arthritis of both knees with bone-on-bone wear of the medial compartment on both sides.  There is also osteophytes in all 3 compartments and the arthritis is quite severe.  There is varus malalignment of both knees.  I am not sure she is a surgical candidate based on her comorbidities.  She does need clearance from Dr. Gwenlyn Found of her cardiovascular disease in her heart.  We will contact Dr. Santiago Bur office to see if we can find out what her last hemoglobin A1c is and we can go from there.  Right now I am not comfortable with scheduling the surgery.  We can talk about that at other follow-up appointments in terms of how to proceed with surgery if at all.  She understands this as well.   We were able to obtain the most  recent hemoglobin A1c from her primary care physician's office.  On 05/23/2022 her hemoglobin A1c was 8.0.  Her blood glucose control needs to be much better before even considering joint replacement surgery.  She would have to have a hemoglobin 11.7 of below 7.7.

## 2022-07-17 ENCOUNTER — Telehealth: Payer: Self-pay

## 2022-07-17 NOTE — Telephone Encounter (Signed)
I believe so. If it's the one I'm thinking of, I gave it to [employer]. Thanks!

## 2022-07-17 NOTE — Telephone Encounter (Signed)
Have you seen a fax from her PCP with A1C results?

## 2022-07-18 ENCOUNTER — Other Ambulatory Visit: Payer: Self-pay | Admitting: Cardiovascular Disease

## 2022-07-29 NOTE — Progress Notes (Signed)
Cardiology Clinic Note   Patient Name: Miranda [name] Date of Encounter: 08/01/2022  Primary Care Provider:  [name], MD Primary Cardiologist:  Nanetta Batty, MD  Patient Profile    67 year old female with history of HTN, Type II diabetes, hyperlipidemia and tobacco abuse of approximately 20-30 pack years currently smoking 3-5 cigarettes a day. She is disabled because of her back and knees as well as diabetes. She's had a stroke in the past. She was admitted with chest pain and had a minimally elevated troponin.   She underwent cardiac catheterization on 11/21/15 by Dr. Swaziland revealing a 90% proximal AV groove circumflex 60% distal LAD lesion. Her circumflex was stented with a drug-eluting stent. Myoview stress test performed 08/13/2021 was nonischemic with normal EF.   She had a lower extremity arterial Doppler studies performed on 02/18/2022 that showed a right ABI of 0.54 and a left of 0.70. She had a short segment occlusion mid right SFA with tibial vessel disease and high-grade segmental mid left SFA stenosis. She was started on Pletal 50 mg BID, and was not found to be a candidate for revascularization due to ongoing tobacco abuse.   Saw  Dr. Doneen Poisson, orthopedic surgeon due to bilateral knee pain on 07/14/2022. X-rays of both knees show severe end-stage arthritis of both knees with bone-on-bone wear of the medial compartment on both sides.  There is also osteophytes in all 3 compartments and the arthritis is quite severe.  There is varus malalignment of both knees.   Uncertain surgical candidate due to co-morbidities. She does need clearance from Dr. Allyson Sabal or cardiology practice for evaluation of her cardiovascular disease pre-operatively.   Past Medical History    Past Medical History:  Diagnosis Date   Acid reflux    Anxiety    Asthma    Degenerative disc disease    Depression    Diabetes mellitus    Hypertension    Hypoglycemia 12/30/2016   Stroke     04/22/12   Past Surgical History:  Procedure Laterality Date   CARDIAC CATHETERIZATION N/A 11/21/2015   Procedure: Left Heart Cath and Coronary Angiography;  Surgeon: Peter M Swaziland, MD;  Location: [facility] INVASIVE CV LAB;  Service: Cardiovascular;  Laterality: N/A;   CARDIAC CATHETERIZATION N/A 11/21/2015   Procedure: Coronary Stent Intervention;  Surgeon: Peter M Swaziland, MD;  Location: [facility] INVASIVE CV LAB;  Service: Cardiovascular;  Laterality: N/A;   KNEE SURGERY     TUBAL LIGATION      Allergies  No Known Allergies  History of Present Illness    Miranda [name] comes today for preoperative evaluation to have bilateral knee replacement.  She is going to have this completed by Dr. Doneen Poisson at [facility].  Unfortunately she has gone back to smoking about half a pack a week due to pain and stress.  She is not very active due to her knee pain, and is very anxious about her health.  She is medically compliant.  Her main complaint is worsening dyspnea since restarting smoking.  Home Medications    Current Outpatient Medications  Medication Sig Dispense Refill   ACCU-CHEK AVIVA PLUS test strip 1 each as needed.  5   albuterol (PROVENTIL HFA;VENTOLIN HFA) 108 (90 Base) MCG/ACT inhaler Inhale 2 puffs into the lungs every 6 (six) hours as needed for wheezing or shortness of breath.     amLODipine (NORVASC) 5 MG tablet Take 5 mg by mouth daily.     atorvastatin (LIPITOR) 80  MG tablet TAKE 1 TABLET EVERY DAY AT 6 PM 15 tablet 0   B-D ULTRAFINE III SHORT PEN 31G X 8 MM MISC 1 each as needed.  11   buPROPion (WELLBUTRIN SR) 150 MG 12 hr tablet Take 150 mg by mouth daily.     carvedilol (COREG) 12.5 MG tablet TAKE 1 TABLET (12.5 MG TOTAL) BY MOUTH 2 (TWO) TIMES DAILY WITH A MEAL. 60 tablet 10   cilostazol (PLETAL) 50 MG tablet Take 1 tablet (50 mg total) by mouth 2 (two) times daily. 180 tablet 2   cloNIDine (CATAPRES) 0.1 MG tablet Take 0.1 mg by mouth daily as needed (sleep).       Cyanocobalamin (B-12) 1000 MCG CAPS Take 1,000 mcg by mouth daily.      cyclobenzaprine (FLEXERIL) 10 MG tablet Take 10 mg by mouth 3 (three) times daily.     diclofenac sodium (VOLTAREN) 1 % GEL Apply 4 g topically 4 (four) times daily. 1 Tube 0   diclofenac Sodium (VOLTAREN) 1 % GEL Apply 1 application topically 4 (four) times daily as needed for pain.     fluconazole (DIFLUCAN) 150 MG tablet SMARTSIG:1 Tablet(s) By Mouth Once a Week PRN     fluticasone (FLONASE) 50 MCG/ACT nasal spray Place 2 sprays into both nostrils as needed.     furosemide (LASIX) 20 MG tablet Take 1 tablet (20 mg total) by mouth daily. 3 tablet 0   gabapentin (NEURONTIN) 300 MG capsule Take 300 mg by mouth 3 (three) times daily.      Glycerin, Adult, 2.1 g SUPP Place 1 suppository rectally daily as needed for moderate constipation. 20 suppository 0   JARDIANCE 10 MG TABS tablet Take 10 mg by mouth daily.     LANTUS SOLOSTAR 100 UNIT/ML Solostar Pen Inject 50 Units into the skin in the morning and at bedtime.     metFORMIN (GLUCOPHAGE) 1000 MG tablet Take 1,000 mg by mouth 2 (two) times daily.     metroNIDAZOLE (FLAGYL) 500 MG tablet Take 1 tablet (500 mg total) by mouth 2 (two) times daily. 14 tablet 0   MOVANTIK 25 MG TABS tablet Take 25 mg by mouth daily before breakfast.   5   nystatin cream (MYCOSTATIN) Apply 1 Application topically as needed.     ondansetron (ZOFRAN ODT) 4 MG disintegrating tablet Take 1 tablet (4 mg total) by mouth every 6 (six) hours as needed. 20 tablet 0   oxyCODONE-acetaminophen (PERCOCET) 7.5-325 MG tablet Take 1 tablet by mouth every 4 (four) hours as needed for severe pain.     polyethylene glycol (MIRALAX / GLYCOLAX) packet Take 17 g by mouth daily as needed. 14 each 0   potassium chloride (KLOR-CON) 10 MEQ tablet Take 1 tablet (10 mEq total) by mouth daily. 3 tablet 0   traZODone (DESYREL) 50 MG tablet Take 50 mg by mouth at bedtime as needed for sleep.      Vitamin D, Ergocalciferol,  (DRISDOL) 50000 units CAPS capsule Take 50,000 Units by mouth every 7 (seven) days. monday     clopidogrel (PLAVIX) 75 MG tablet Take 1 tablet (75 mg total) by mouth daily. 60 tablet 2   metFORMIN (GLUMETZA) 1000 MG (MOD) 24 hr tablet Take 1 tablet (1,000 mg total) by mouth 2 (two) times daily with a meal. Restart tomorrow (Patient not taking: Reported on 08/01/2022)     No current facility-administered medications for this visit.     Family History    Family History  Problem  Relation Age of Onset   Hypertension Other    She indicated that her mother is deceased. She indicated that her father is deceased. She indicated that the status of her other is unknown.  Social History    Social History   Socioeconomic History   Marital status: Divorced    Spouse name: Not on file   Number of children: Not on file   Years of education: Not on file   Highest education level: Not on file  Occupational History   Not on file  Tobacco Use   Smoking status: Every Day    Packs/day: 0.25    Years: 45.00    Additional pack years: 0.00    Total pack years: 11.25    Types: Cigarettes   Smokeless tobacco: Never  Vaping Use   Vaping Use: Never used  Substance and Sexual Activity   Alcohol use: No   Drug use: No   Sexual activity: Never  Other Topics Concern   Not on file  Social History Narrative   Not on file   Social Determinants of Health   Financial Resource Strain: Not on file  Food Insecurity: Not on file  Transportation Needs: Not on file  Physical Activity: Not on file  Stress: Not on file  Social Connections: Not on file  Intimate Partner Violence: Not on file     Review of Systems    General:  No chills, fever, night sweats or weight changes.  Cardiovascular:  No chest pain, positive for dyspnea on exertion, edema, orthopnea, palpitations, paroxysmal nocturnal dyspnea. Dermatological: No rash, lesions/masses Respiratory: No cough, dyspnea Urologic: No hematuria,  dysuria Abdominal:   No nausea, vomiting, diarrhea, bright red blood per rectum, melena, or hematemesis Neurologic:  No visual changes, wkns, changes in mental status. All other systems reviewed and are otherwise negative except as noted above.     Physical Exam    VS:  BP (!) 142/72 (BP Location: Left Arm, Patient Position: Sitting, Cuff Size: Large)   Pulse 87   Ht  (1.778 m)   Wt 228 lb 6.4 oz (103.6 kg)   SpO2 96%   BMI 32.77 kg/m  , BMI Body mass index is 32.77 kg/m.     GEN: Well nourished, well developed, in no acute distress. HEENT: normal. Neck: Supple, no JVD, carotid bruits, or masses. Cardiac: RRR, no murmurs, rubs, or gallops. No clubbing, cyanosis, mild nonpitting pretibial edema.  Radials/DP/PT 2+ and equal bilaterally.  Respiratory:  Respirations regular and unlabored, clear to auscultation bilaterally. GI: Soft, nontender, nondistended, BS + x 4. MS: no deformity or atrophy.  Severe pain with range of motion of knees bilaterally. Skin: warm and dry, no rash. Neuro:  Strength and sensation are intact. Psych: Normal affect.  Accessory Clinical Findings    ECG personally reviewed by me today-normal sinus rhythm with nonspecific T wave abnormality, T wave flattening in the lateral leads.  Heart rate of 83 bpm.  Lab Results  Component Value Date   WBC 6.4 05/23/2022   HGB 13.6 05/23/2022   HCT 41.8 05/23/2022   MCV 85.1 05/23/2022   PLT 324 05/23/2022   Lab Results  Component Value Date   CREATININE 0.93 05/23/2022   BUN 12 05/23/2022   NA 147 (H) 05/23/2022   K 4.3 05/23/2022   CL 108 05/23/2022   CO2 24 05/23/2022   Lab Results  Component Value Date   ALT 9 05/23/2022   AST 9 (L) 05/23/2022   ALKPHOS  165 (H) 03/08/2019   BILITOT 0.4 05/23/2022   Lab Results  Component Value Date   CHOL 149 05/23/2022   HDL 41 (L) 05/23/2022   LDLCALC 91 05/23/2022   TRIG 79 05/23/2022   CHOLHDL 3.6 05/23/2022    Lab Results  Component Value Date    HGBA1C 9.1 (H) 12/30/2016    Review of Prior Studies: NM Stress Test 08/13/2021 Perfusion/Defect LV perfusion is normal.  Chamber Size Left ventricular function is normal. Nuclear stress EF: 67 %. The left ventricular ejection fraction is hyperdynamic (>65%). End diastolic cavity size is normal.  Stress Combined Conclusion The study is normal. The study is low risk.   ABI 02/18/2022 Summary:  Right: Resting right ankle-brachial index indicates moderate right lower  extremity arterial disease. The right toe-brachial index is abnormal.   Left: Resting left ankle-brachial index indicates moderate left lower  extremity arterial disease. The left toe-brachial index is abnormal.   Assessment & Plan   1. Pre-Operative Evaluation:   Chart reviewed as part of pre-operative protocol coverage. Given past medical history and time since last visit, based on ACC/AHA guidelines, JASLIN NOVITSKI would be at acceptable risk for the planned procedure without further cardiovascular testing.  Although she is not very active, she had a normal stress Myoview in 2023 which was found to be low risk.  She remains on Plavix 75 mg daily.  It would be recommended that she stop Plavix 5 days prior to knee replacement surgery and to start ASAP thereafter when hemodynamically stable.  2.  Coronary artery disease: Status post circumflex drug-eluting stent on 11/21/2015 by Dr. Swaziland.  She remains on dual antiplatelet therapy with aspirin and Plavix.  Recommendations for cessation as discussed above perioperatively.  Continue secondary management with blood pressure control, statin therapy, smoking cessation.  3.  Hypercholesterolemia: Goal of LDL less than 70.  She remains on atorvastatin 80 mg daily.  Labs are followed by PCP and cardiology.  Most recent labs on 05/23/2022 LDL 91, cholesterol 149, HDL 41.  Will need to add Zetia 10 mg daily to bring cholesterol to goal.  5.  Hypertension: Blood pressure is elevated today and  not at goal.  She is in a lot of pain with her knees and is recently finished smoking a cigarette.  She remains on amlodipine 5 mg daily, clonidine 0.1 mg daily, furosemide 20 mg daily, and carvedilol 12.5 mg twice daily.  She is due to see Dr. Allyson Sabal in 1 month.  Would recommend increasing amlodipine or carvedilol if blood pressure remains elevated on next office visit.  6.  PAD: Patient is not a candidate for revascularization.  Uncertain how this will impact knee replacement surgery.  Will defer to Dr. Allyson Sabal concerning this.  Will continue Pletal as directed.  7.  Ongoing tobacco abuse: Smoke cessation education is given especially with pending surgery, anesthesia, and PAD.  Defer to anesthesia concerning need for pulmonary evaluation due to chronic shortness of breath.            Signed, Bettey Mare. Liborio Nixon, ANP, AACC   08/01/2022 5:17 PM      Office [phone] Fax [phone]  Notice: This dictation was prepared with Dragon dictation along with smaller phrase technology. Any transcriptional errors that result from this process are unintentional and may not be corrected upon review.

## 2022-08-01 ENCOUNTER — Ambulatory Visit: Payer: 59 | Attending: Adult Health | Admitting: Adult Health

## 2022-08-01 ENCOUNTER — Encounter: Payer: Self-pay | Admitting: Adult Health

## 2022-08-01 VITALS — BP 142/72 | HR 87 | Ht 70.0 in | Wt 228.4 lb

## 2022-08-01 DIAGNOSIS — Z01818 Encounter for other preprocedural examination: Secondary | ICD-10-CM | POA: Diagnosis not present

## 2022-08-01 DIAGNOSIS — I2510 Atherosclerotic heart disease of native coronary artery without angina pectoris: Secondary | ICD-10-CM

## 2022-08-01 DIAGNOSIS — Z720 Tobacco use: Secondary | ICD-10-CM

## 2022-08-01 DIAGNOSIS — F17210 Nicotine dependence, cigarettes, uncomplicated: Secondary | ICD-10-CM

## 2022-08-01 DIAGNOSIS — I739 Peripheral vascular disease, unspecified: Secondary | ICD-10-CM

## 2022-08-01 DIAGNOSIS — I10 Essential (primary) hypertension: Secondary | ICD-10-CM

## 2022-08-01 DIAGNOSIS — E7800 Pure hypercholesterolemia, unspecified: Secondary | ICD-10-CM | POA: Diagnosis not present

## 2022-08-01 MED ORDER — CLOPIDOGREL BISULFATE 75 MG PO TABS: 75.0000 mg | ORAL_TABLET | Freq: Every day | ORAL | 2 refills | Status: DC

## 2022-08-01 NOTE — Patient Instructions (Signed)
Medication Instructions:  No Changes *If you need a refill on your cardiac medications before your next appointment, please call your pharmacy*   Lab Work: No Labs If you have labs (blood work) drawn today and your tests are completely normal, you will receive your results only by: MyChart Message (if you have MyChart) OR A paper copy in the mail If you have any lab test that is abnormal or we need to change your treatment, we will call you to review the results.   Testing/Procedures: No Testing   Follow-Up: At [facility], you and your health needs are our priority.  As part of our continuing mission to provide you with exceptional heart care, we have created designated Provider Care Teams.  These Care Teams include your primary Cardiologist (physician) and Advanced Practice Providers (APPs -  Physician Assistants and Nurse Practitioners) who all work together to provide you with the care you need, when you need it.  We recommend signing up for the patient portal called "MyChart".  Sign up information is provided on this After Visit Summary.  MyChart is used to connect with patients for Virtual Visits (Telemedicine).  Patients are able to view lab/test results, encounter notes, upcoming appointments, etc.  Non-urgent messages can be sent to your provider as well.   To learn more about what you can do with MyChart, go to [email or web address].    Your next appointment:   Keep Scheduled Appointment  Provider:   Nanetta Batty, MD

## 2022-08-08 ENCOUNTER — Other Ambulatory Visit: Payer: Self-pay | Admitting: Cardiovascular Disease

## 2022-08-19 ENCOUNTER — Ambulatory Visit: Payer: 59 | Attending: Cardiovascular Disease | Admitting: Cardiovascular Disease

## 2022-08-20 ENCOUNTER — Encounter: Payer: Self-pay | Admitting: Cardiovascular Disease

## 2022-08-21 ENCOUNTER — Ambulatory Visit (INDEPENDENT_AMBULATORY_CARE_PROVIDER_SITE_OTHER): Payer: 59 | Admitting: Podiatry

## 2022-08-21 DIAGNOSIS — M2142 Flat foot [pes planus] (acquired), left foot: Secondary | ICD-10-CM

## 2022-08-21 DIAGNOSIS — M2141 Flat foot [pes planus] (acquired), right foot: Secondary | ICD-10-CM | POA: Diagnosis not present

## 2022-08-21 DIAGNOSIS — M2042 Other hammer toe(s) (acquired), left foot: Secondary | ICD-10-CM

## 2022-08-21 DIAGNOSIS — E1142 Type 2 diabetes mellitus with diabetic polyneuropathy: Secondary | ICD-10-CM | POA: Diagnosis not present

## 2022-08-21 DIAGNOSIS — M2041 Other hammer toe(s) (acquired), right foot: Secondary | ICD-10-CM

## 2022-08-21 DIAGNOSIS — L600 Ingrowing nail: Secondary | ICD-10-CM

## 2022-08-21 NOTE — Progress Notes (Signed)
  Subjective:  Patient ID: Miranda [name], female    DOB: 1955/08/09,  MRN: [id number]  Chief Complaint  Patient presents with   Diabetes    Diabetic shoes.Right great toe ingrown BS-88 A1C-8.7    67 y.o. female presents with concern for possible ingrown nail on the right great toe.  She says the medial border has been bothering her for a while.  She previously had a whole nail removed and it never grew back correctly.  Having chronic pain at that area that she gets trimmed out with her pedicures.  Past Medical History:  Diagnosis Date   Acid reflux    Anxiety    Asthma    Degenerative disc disease    Depression    Diabetes mellitus    Hypertension    Hypoglycemia 12/30/2016   Stroke (HCC)    04/22/12    No Known Allergies  ROS: Negative except as per HPI above  Objective:  General: AAO x3, NAD  Dermatological: Thickness dystrophy and pain at the medial border of the right hallux nail.  There is fungal infection of the nail.  Vascular:  Dorsalis Pedis artery and Posterior Tibial artery pedal pulses are 2/4 bilateral.  Capillary fill time < 3 sec to all digits.   Neruologic: Grossly absent via light touch protective sensation is absent  Musculoskeletal: Pes planus foot structure as well as mild hammertoe deformity of the fourth and fifth toe on the bilateral foot.  Gait: Unassisted, Nonantalgic.   No images are attached to the encounter.   Assessment:   1. Ingrown nail of great toe of right foot   2. Pes planus of both feet   3. Hammertoe, bilateral   4. DM type 2 with diabetic peripheral neuropathy (HCC)      Plan:  Patient was evaluated and treated and all questions answered.  # DM2 with neuropathy poorly controlled Patient educated on diabetes. Discussed proper diabetic foot care and discussed risks and complications of disease. Educated patient in depth on reasons to return to the office immediately should he/she discover anything concerning or new on the  feet. All questions answered. Discussed proper shoes as well.  -Recommend diabetic shoes and inserts for prevention of pressure ulcerations given pes planus deformity as well as hammertoe deformity bilaterally neuropathy. -Will schedule patient to be fitted for diabetic shoes and liners  # Ingrown nail of right hallux medial border -Discussed with patient removal of the medial border of the right hallux with phenol and alcohol matrixectomy. -She is interested in this but wishes to defer for 1 month.  Will schedule her for this procedure at that time -In meantime recommend Epsom salt soaks and pedicure as needed   Return in about 4 weeks (around 09/18/2022) for Ingrown nail procedure R hallux.          Corinna Gab, DPM [facility]

## 2022-08-22 ENCOUNTER — Ambulatory Visit: Payer: 59 | Admitting: Podiatry

## 2022-08-27 ENCOUNTER — Other Ambulatory Visit: Payer: Self-pay

## 2022-08-27 ENCOUNTER — Emergency Department (HOSPITAL_COMMUNITY)
Admission: EM | Admit: 2022-08-27 | Discharge: 2022-08-27 | Disposition: A | Discharge status: Home / Self Care | Payer: 59 | Attending: Emergency Medicine | Admitting: Emergency Medicine

## 2022-08-27 ENCOUNTER — Emergency Department (HOSPITAL_COMMUNITY): Payer: 59

## 2022-08-27 ENCOUNTER — Encounter (HOSPITAL_COMMUNITY): Payer: Self-pay

## 2022-08-27 DIAGNOSIS — Z794 Long term (current) use of insulin: Secondary | ICD-10-CM | POA: Insufficient documentation

## 2022-08-27 DIAGNOSIS — R4789 Other speech disturbances: Secondary | ICD-10-CM | POA: Insufficient documentation

## 2022-08-27 DIAGNOSIS — E119 Type 2 diabetes mellitus without complications: Secondary | ICD-10-CM | POA: Diagnosis not present

## 2022-08-27 DIAGNOSIS — Z7984 Long term (current) use of oral hypoglycemic drugs: Secondary | ICD-10-CM | POA: Diagnosis not present

## 2022-08-27 DIAGNOSIS — Z7902 Long term (current) use of antithrombotics/antiplatelets: Secondary | ICD-10-CM | POA: Insufficient documentation

## 2022-08-27 DIAGNOSIS — Z8673 Personal history of transient ischemic attack (TIA), and cerebral infarction without residual deficits: Secondary | ICD-10-CM | POA: Insufficient documentation

## 2022-08-27 DIAGNOSIS — R4781 Slurred speech: Secondary | ICD-10-CM | POA: Diagnosis present

## 2022-08-27 LAB — CBC
HCT: 47.4 % — ABNORMAL HIGH (ref 36.0–46.0)
Hemoglobin: 14.2 g/dL (ref 12.0–15.0)
MCH: 27.2 pg (ref 26.0–34.0)
MCHC: 30 g/dL (ref 30.0–36.0)
MCV: 90.6 fL (ref 80.0–100.0)
Platelets: 317 10*3/uL (ref 150–400)
RBC: 5.23 MIL/uL — ABNORMAL HIGH (ref 3.87–5.11)
RDW: 13.9 % (ref 11.5–15.5)
WBC: 8 10*3/uL (ref 4.0–10.5)
nRBC: 0 % (ref 0.0–0.2)

## 2022-08-27 LAB — BASIC METABOLIC PANEL
Anion gap: 14 (ref 5–15)
BUN: 12 mg/dL (ref 8–23)
CO2: 23 mmol/L (ref 22–32)
Calcium: 9.6 mg/dL (ref 8.9–10.3)
Chloride: 107 mmol/L (ref 98–111)
Creatinine, Ser: 0.84 mg/dL (ref 0.44–1.00)
GFR, Estimated: >60 mL/min (ref >60)
Glucose, Bld: 129 mg/dL — ABNORMAL HIGH (ref 70–99)
Potassium: 3.8 mmol/L (ref 3.5–5.1)
Sodium: 144 mmol/L (ref 135–145)

## 2022-08-27 LAB — TROPONIN I (HIGH SENSITIVITY)
Troponin I (High Sensitivity): 31 ng/L — ABNORMAL HIGH (ref <18)
Troponin I (High Sensitivity): 31 ng/L — ABNORMAL HIGH (ref <18)

## 2022-08-27 LAB — CBG MONITORING, ED: Glucose-Capillary: 144 mg/dL — ABNORMAL HIGH (ref 70–99)

## 2022-08-27 MED ORDER — HYDROMORPHONE HCL 1 MG/ML IJ SOLN
1.0000 mg | Freq: Once | INTRAMUSCULAR | Status: AC
  Administered 2022-08-27: 1 mg via INTRAVENOUS
  Filled 2022-08-27: qty 1

## 2022-08-27 MED ORDER — FENTANYL CITRATE PF 50 MCG/ML IJ SOSY
50.0000 ug | PREFILLED_SYRINGE | Freq: Once | INTRAMUSCULAR | Status: AC
  Administered 2022-08-27: 50 ug via INTRAVENOUS
  Filled 2022-08-27: qty 1

## 2022-08-27 NOTE — ED Triage Notes (Signed)
Pt arrives POV with complaints of feeling "woozy" and having slurred speech. Pt was having chest pain and went to her doctor today then was sent here to be checked for slurred speech. All of her symptoms started yesterday. Pt has a history of strokes with right sided weakness.  Denies vision changes/headache/n/v

## 2022-08-27 NOTE — ED Provider Triage Note (Signed)
Emergency Medicine Provider Triage Evaluation Note  Miranda [name] , a 67 y.o. female  was evaluated in triage.  Pt complains of slurred speech since yesterday evening.Prior hx of CVA on Plavix, she went to urgent care today and they told her that her heart rate was low in the 40s, blood pressure was elevated.  She did have some chest pain yesterday.  Feels that her speech has not resolved since.  She does not have any vision changes, no nausea, no headache, no vomiting.  Review of Systems  Positive: Slurred speech Negative: headache  Physical Exam  BP (!) 157/94 (BP Location: Right Arm)   Pulse 82   Temp 97.9 F (36.6 C) (Oral)   Resp 19   SpO2 97%  Gen:   Awake, no distress   Resp:  Normal effort  MSK:   Moves extremities without difficulty  Other:  Neuro exam is reassuring.   Medical Decision Making  Medically screening exam initiated at 2:30 PM.  Appropriate orders placed.  Miranda [name] was informed that the remainder of the evaluation will be completed by another provider, this initial triage assessment does not replace that evaluation, and the importance of remaining in the ED until their evaluation is complete.     Claude Manges, PA-C 08/27/22 1432

## 2022-08-27 NOTE — Discharge Instructions (Addendum)
Please continue taking your pain medicine as prescribed  As we discussed, you do not have a stroke right now  Please follow-up with your doctor  Return to ER if you have trouble speaking, uncontrolled pain

## 2022-08-27 NOTE — ED Provider Notes (Signed)
[name] Provider Note   CSN: [id number] Arrival date & time: 08/27/22  1417     History  Chief Complaint  Patient presents with   Weakness    Miranda [name] is a 67 y.o. female history of previous stroke, diabetes, chronic pain here presenting with slurred speech.  Patient states that she went to her pain management doctor today and she was noted to be bradycardic and hypertensive.  She states that she did get her Percocet refilled at the office but came here because she was not feeling well.  She states that she has some slurred speech since yesterday.  Patient states that she has a history of stroke.  Patient states that she is also in a lot of pain and has some headaches.  The history is provided by the patient.       Home Medications Prior to Admission medications   Medication Sig Start Date End Date Taking? Authorizing Provider  ACCU-CHEK AVIVA PLUS test strip 1 each as needed. 10/14/16   [provider]  albuterol (PROVENTIL HFA;VENTOLIN HFA) 108 (90 Base) MCG/ACT inhaler Inhale 2 puffs into the lungs every 6 (six) hours as needed for wheezing or shortness of breath.    [provider]  amLODipine (NORVASC) 5 MG tablet Take 5 mg by mouth daily. 08/14/19   [provider]  atorvastatin (LIPITOR) 80 MG tablet TAKE 1 TABLET EVERY DAY AT 6 PM 06/21/20   [place], Peter M, MD  B-D ULTRAFINE III SHORT PEN 31G X 8 MM MISC 1 each as needed. 11/18/16   [provider]  buPROPion (WELLBUTRIN SR) 150 MG 12 hr tablet Take 150 mg by mouth daily. 02/09/22   [provider]  carvedilol (COREG) 12.5 MG tablet TAKE 1 TABLET (12.5MG  TOTAL) BY MOUTH TWICE A DAY WITH MEALS 08/11/22   [name], MD  cilostazol (PLETAL) 50 MG tablet Take 1 tablet (50 mg total) by mouth 2 (two) times daily. 02/18/22   [name], MD  cloNIDine (CATAPRES) 0.1 MG tablet Take 0.1 mg by mouth daily as needed (sleep).   08/14/19   [provider]  clopidogrel (PLAVIX) 75 MG tablet Take 1 tablet (75 mg total) by mouth daily. 08/01/22   Jodelle Gross, NP  Cyanocobalamin (B-12) 1000 MCG CAPS Take 1,000 mcg by mouth daily.     [provider]  cyclobenzaprine (FLEXERIL) 10 MG tablet Take 10 mg by mouth 3 (three) times daily. 01/31/22   [provider]  diclofenac sodium (VOLTAREN) 1 % GEL Apply 4 g topically 4 (four) times daily. 02/11/18   Calvert Cantor, MD  diclofenac Sodium (VOLTAREN) 1 % GEL Apply 1 application topically 4 (four) times daily as needed for pain. 08/31/19   [provider]  fluconazole (DIFLUCAN) 150 MG tablet SMARTSIG:1 Tablet(s) By Mouth Once a Week PRN 06/26/22   [provider]  fluticasone (FLONASE) 50 MCG/ACT nasal spray Place 2 sprays into both nostrils as needed. 05/18/22   [provider]  furosemide (LASIX) 20 MG tablet Take 1 tablet (20 mg total) by mouth daily. 09/05/19   Lorre Nick, MD  gabapentin (NEURONTIN) 300 MG capsule Take 300 mg by mouth 3 (three) times daily.     [provider]  Glycerin, Adult, 2.1 g SUPP Place 1 suppository rectally daily as needed for moderate constipation. 02/11/18   Calvert Cantor, MD  JARDIANCE 10 MG TABS tablet Take 10 mg by mouth daily.  01/13/22   [provider]  LANTUS SOLOSTAR 100 UNIT/ML Solostar Pen Inject 50 Units into the skin in the morning and at bedtime. 08/15/19   [provider]  metFORMIN (GLUCOPHAGE) 1000 MG tablet Take 1,000 mg by mouth 2 (two) times daily. 08/14/19   [provider]  metFORMIN (GLUMETZA) 1000 MG (MOD) 24 hr tablet Take 1 tablet (1,000 mg total) by mouth 2 (two) times daily with a meal. Restart tomorrow Patient not taking: Reported on 08/01/2022 11/23/15   Zannie Cove, MD  metroNIDAZOLE (FLAGYL) 500 MG tablet Take 1 tablet (500 mg total) by mouth 2 (two) times daily. 04/16/22   LampteyBritta Mccreedy, MD  MOVANTIK 25 MG TABS tablet Take 25 mg  by mouth daily before breakfast.  12/12/16   [provider]  nystatin cream (MYCOSTATIN) Apply 1 Application topically as needed. 07/31/22   [provider]  ondansetron (ZOFRAN ODT) 4 MG disintegrating tablet Take 1 tablet (4 mg total) by mouth every 6 (six) hours as needed. 02/05/18   Ward, Layla Maw, DO  oxyCODONE-acetaminophen (PERCOCET) 7.5-325 MG tablet Take 1 tablet by mouth every 4 (four) hours as needed for severe pain.    [provider]  polyethylene glycol (MIRALAX / GLYCOLAX) packet Take 17 g by mouth daily as needed. 02/11/18   Calvert Cantor, MD  potassium chloride (KLOR-CON) 10 MEQ tablet Take 1 tablet (10 mEq total) by mouth daily. 09/05/19   Lorre Nick, MD  traZODone (DESYREL) 50 MG tablet Take 50 mg by mouth at bedtime as needed for sleep.     [provider]  Vitamin D, Ergocalciferol, (DRISDOL) 50000 units CAPS capsule Take 50,000 Units by mouth every 7 (seven) days. monday    [provider]      Allergies    Patient has no known allergies.    Review of Systems   Review of Systems  Neurological:  Positive for speech difficulty and weakness.  All other systems reviewed and are negative.   Physical Exam Updated Vital Signs BP 127/78   Pulse 77   Temp 97.8 F (36.6 C) (Oral)   Resp 18   Ht 5\' 10"  (1.778 m)   Wt 99.8 kg   SpO2 93%   BMI 31.57 kg/m  Physical Exam Vitals and nursing note reviewed.  Constitutional:      Comments: Uncomfortable  HENT:     Head: Normocephalic.     Nose: Nose normal.     Mouth/Throat:     Mouth: Mucous membranes are moist.  Eyes:     Extraocular Movements: Extraocular movements intact.     Pupils: Pupils are equal, round, and reactive to light.  Cardiovascular:     Rate and Rhythm: Normal rate and regular rhythm.     Pulses: Normal pulses.     Heart sounds: Normal heart sounds.  Pulmonary:     Effort: Pulmonary effort is normal.     Breath sounds: Normal breath sounds.   Abdominal:     General: Abdomen is flat.     Palpations: Abdomen is soft.  Musculoskeletal:        General: Normal range of motion.     Cervical back: Normal range of motion and neck supple.  Skin:    General: Skin is warm.     Capillary Refill: Capillary refill takes less than 2 seconds.  Neurological:     Mental Status: She is alert.     Comments: No obvious facial droop.  Questionable slurred speech  but patient also appears somewhat sedated from pain medicine.  Patient has normal strength bilateral upper and lower extremities  Psychiatric:        Mood and Affect: Mood normal.        Behavior: Behavior normal.     ED Results / Procedures / Treatments   Labs (all labs ordered are listed, but only abnormal results are displayed) Labs Reviewed  BASIC METABOLIC PANEL - Abnormal; Notable for the following components:      Result Value   Glucose, Bld 129 (*)    All other components within normal limits  CBC - Abnormal; Notable for the following components:   RBC 5.23 (*)    HCT 47.4 (*)    All other components within normal limits  CBG MONITORING, ED - Abnormal; Notable for the following components:   Glucose-Capillary 144 (*)    All other components within normal limits  TROPONIN I (HIGH SENSITIVITY) - Abnormal; Notable for the following components:   Troponin I (High Sensitivity) 31 (*)    All other components within normal limits  TROPONIN I (HIGH SENSITIVITY) - Abnormal; Notable for the following components:   Troponin I (High Sensitivity) 31 (*)    All other components within normal limits    EKG EKG Interpretation  Date/Time:  Wednesday Aug 27 2022 14:32:49 EDT Ventricular Rate:  81 PR Interval:  162 QRS Duration: 86 QT Interval:  372 QTC Calculation: 432 R Axis:   21 Text Interpretation: Normal sinus rhythm Nonspecific T wave abnormality Abnormal ECG When compared with ECG of 05-Feb-2018 04:14, No significant change since last tracing Confirmed by Richardean Canal  [phone]) on 08/27/2022 3:38:39 PM  Radiology MR BRAIN WO CONTRAST  Result Date: 08/27/2022 CLINICAL DATA:  Stroke suspected EXAM: MRI HEAD WITHOUT CONTRAST TECHNIQUE: Multiplanar, multiecho pulse sequences of the brain and surrounding structures were obtained without intravenous contrast. COMPARISON:  05/31/2014 MRI head, correlation is made with 08/27/2022 CT head FINDINGS: Brain: No restricted diffusion to suggest acute or subacute infarct. No acute hemorrhage, mass, mass effect, or midline shift. No hydrocephalus or extra-axial collection. Normal pituitary and craniocervical junction. No hemosiderin deposition to suggest remote hemorrhage. Remote lacunar infarcts in the basal ganglia and right corona radiata. Confluent T2 hyperintense signal in the periventricular white matter and pons, likely the sequela of mild-to-moderate chronic small vessel ischemic disease. Vascular: Normal arterial flow voids. Skull and upper cervical spine: Normal marrow signal. Sinuses/Orbits: Minimal mucosal thickening in the ethmoid air cells. No acute finding in the orbits. Other: The mastoid air cells are well aerated. IMPRESSION: No acute intracranial process. No evidence of acute or subacute infarct. Electronically Signed   By: Wiliam Ke M.D.   On: 08/27/2022 19:44   CT HEAD WO CONTRAST ( )  Result Date: 08/27/2022 CLINICAL DATA:  Provided history: Transient ischemic attack (TIA). EXAM: CT HEAD WITHOUT CONTRAST TECHNIQUE: Contiguous axial images were obtained from the base of the skull through the vertex without intravenous contrast. RADIATION DOSE REDUCTION: This exam was performed according to the departmental dose-optimization program which includes automated exposure control, adjustment of the mA and/or kV according to patient size and/or use of iterative reconstruction technique. COMPARISON:  Head CT 12/30/2016. FINDINGS: Brain: No age advanced or lobar predominant parenchymal atrophy. Redemonstrated chronic  infarct within the left corona radiata. Mild patchy and ill-defined hypoattenuation elsewhere within the cerebral white matter, nonspecific but compatible with chronic small vessel ischemic disease. There is no acute intracranial hemorrhage. No demarcated cortical infarct. No extra-axial fluid  collection. No evidence of an intracranial mass. No midline shift. Vascular: No hyperdense vessel. Atherosclerotic calcifications. Skull: No fracture or aggressive osseous lesion. Sinuses/Orbits: No mass or acute finding within the imaged orbits. Minimal mucosal thickening scattered within bilateral ethmoid air cells. IMPRESSION: 1.  No evidence of an acute intracranial abnormality. 2. Redemonstrated chronic infarct within the left corona radiata. 3. Background mild cerebral white matter chronic small vessel ischemic disease. Electronically Signed   By: Jackey Loge D.O.   On: 08/27/2022 15:16   DG Chest 1 View  Result Date: 08/27/2022 CLINICAL DATA:  Chest pain EXAM: CHEST  1 VIEW COMPARISON:  X-ray 09/05/2019 and older FINDINGS: No consolidation, pneumothorax or effusion. No edema. Normal cardiopericardial silhouette with tortuous and ectatic aorta. Curvature of the spine. IMPRESSION: No acute cardiopulmonary disease. Electronically Signed   By: Karen Kays M.D.   On: 08/27/2022 14:58    Procedures Procedures    Medications Ordered in ED Medications  HYDROmorphone (DILAUDID) injection 1 mg (1 mg Intravenous Given 08/27/22 1547)  fentaNYL (SUBLIMAZE) injection 50 mcg (50 mcg Intravenous Given 08/27/22 1753)    ED Course/ Medical Decision Making/ A&P                             Medical Decision Making Miranda [name] is a 67 y.o. female here presenting with chronic pain and also questionable slurred speech.  I do not appreciate any objective slurred speech or facial droop or focal weakness.  I wonder if this is related to her pain.  Her symptom onset was about 24 hours ago show she is outside of code  stroke window.  Plan to get MRI brain, labs.  Patient also had some chest pain since yesterday and have low suspicion for ACS will get troponin x 2.  7:52 PM Reviewed patient's labs and troponin stable at 31.  MRI brain did not show a stroke.  Patient's pain is under control with pain medicine.  Patient has prescription pain medicine at home.  Stable for discharge   Problems Addressed: Other speech disturbance: acute illness or injury  Amount and/or Complexity of Data Reviewed Radiology: ordered and independent interpretation performed. Decision-making details documented in ED Course.  Risk Prescription drug management.    Final Clinical Impression(s) / ED Diagnoses Final diagnoses:  None    Rx / DC Orders ED Discharge Orders     None         Charlynne Pander, MD 08/27/22 [phone]

## 2022-09-15 ENCOUNTER — Other Ambulatory Visit: Payer: 59

## 2022-09-25 ENCOUNTER — Ambulatory Visit (INDEPENDENT_AMBULATORY_CARE_PROVIDER_SITE_OTHER): Payer: 59 | Admitting: Podiatry

## 2022-09-25 DIAGNOSIS — B353 Tinea pedis: Secondary | ICD-10-CM

## 2022-09-25 DIAGNOSIS — M2041 Other hammer toe(s) (acquired), right foot: Secondary | ICD-10-CM

## 2022-09-25 DIAGNOSIS — M2141 Flat foot [pes planus] (acquired), right foot: Secondary | ICD-10-CM

## 2022-09-25 DIAGNOSIS — L600 Ingrowing nail: Secondary | ICD-10-CM | POA: Diagnosis not present

## 2022-09-25 DIAGNOSIS — E1142 Type 2 diabetes mellitus with diabetic polyneuropathy: Secondary | ICD-10-CM

## 2022-09-25 DIAGNOSIS — M2042 Other hammer toe(s) (acquired), left foot: Secondary | ICD-10-CM

## 2022-09-25 DIAGNOSIS — M2142 Flat foot [pes planus] (acquired), left foot: Secondary | ICD-10-CM

## 2022-09-25 MED ORDER — KETOCONAZOLE 2 % EX CREA: 1.0000 | TOPICAL_CREAM | Freq: Every day | CUTANEOUS | 0 refills | Status: DC

## 2022-09-25 NOTE — Patient Instructions (Signed)

## 2022-09-25 NOTE — Progress Notes (Signed)
Subjective:  Patient ID: Miranda [name], female    DOB: 1955/09/02,  MRN: [id number]  Chief Complaint  Patient presents with   Nail Problem    Pt states there is no ingrown that she is here for some shoes     67 y.o. female presents for consideration for right hallux nail ingrown removal.  This was discussed at prior appointment.  It was planned to be done at that time but she wanted to defer 1 month.  However at this point she states there is no pain in the ingrown nail.  She does however wish to have diabetic shoes and liners need for her.  Also concerned about dry flaking skin and some itching on the bottom of both feet  Past Medical History:  Diagnosis Date   Acid reflux    Anxiety    Asthma    Degenerative disc disease    Depression    Diabetes mellitus    Hypertension    Hypoglycemia 12/30/2016   Stroke (HCC)    04/22/12    No Known Allergies  ROS: Negative except as per HPI above  Objective:  General: AAO x3, NAD  Dermatological: No evidence of ingrown on the right great toe at this time there is no pain with palpation no significant erythema or edema however there is dystrophy of the nail with thickening.  Dry flaking skin present bilateral foot consistent with tinea pedis infection  Vascular:  Dorsalis Pedis artery and Posterior Tibial artery pedal pulses are 2/4 bilateral.  Capillary fill time < 3 sec to all digits.   Neruologic: Grossly intact via light touch bilateral. Protective threshold intact to all sites bilateral.   Musculoskeletal: No gross boney pedal deformities bilateral. No pain, crepitus, or limitation noted with foot and ankle range of motion bilateral. Muscular strength 5/5 in all groups tested bilateral.  Gait: Unassisted, Nonantalgic.   No images are attached to the encounter.   Assessment:   1. Ingrown nail of great toe of right foot   2. Pes planus of both feet   3. Hammertoe, bilateral   4. DM type 2 with diabetic peripheral neuropathy  (HCC)   5. Tinea pedis of both feet      Plan:  Patient was evaluated and treated and all questions answered.  # DM2 with neuropathy poorly controlled Patient educated on diabetes. Discussed proper diabetic foot care and discussed risks and complications of disease. Educated patient in depth on reasons to return to the office immediately should he/she discover anything concerning or new on the feet. All questions answered. Discussed proper shoes as well.  -Recommend diabetic shoes and inserts for prevention of pressure ulcerations given pes planus deformity as well as hammertoe deformity bilaterally neuropathy. -schedule patient to be fitted for diabetic shoes and liners  # Ingrown nail of right hallux medial border -At this point the ingrown appears resolved will defer on any ingrown procedure at this time  # Tinea pedis bilateral Discussed the etiology and treatment options for tinea pedis.  Discussed topical and oral treatment.  Recommended topical treatment with 2% ketoconazole cream.  This was sent to the patient's pharmacy.  Also discussed appropriate foot hygiene, use of antifungal spray such as Tinactin in shoes, as well as cleaning her foot surfaces such as showers and bathroom floors with bleach.   Return in about 3 months (around 12/26/2022) for [facility]          Corinna Gab, DPM [facility] /  [facility]

## 2022-10-27 ENCOUNTER — Other Ambulatory Visit: Payer: 59

## 2022-11-12 ENCOUNTER — Other Ambulatory Visit: Payer: 59

## 2022-11-13 ENCOUNTER — Other Ambulatory Visit: Payer: Self-pay | Admitting: Cardiovascular Disease

## 2022-12-01 ENCOUNTER — Telehealth: Payer: Self-pay | Admitting: Physical Medicine and Rehabilitation

## 2022-12-01 NOTE — Telephone Encounter (Signed)
Pt called requesting an injection for lower back. Pt phone number is [phone].

## 2022-12-01 NOTE — Telephone Encounter (Signed)
Spoke with patient and she is having back pain again in the lower back. Last RFA was 01/2022. No new falls, accidents or injuries. Please advise

## 2022-12-02 ENCOUNTER — Other Ambulatory Visit: Payer: Self-pay | Admitting: Physical Medicine and Rehabilitation

## 2022-12-02 ENCOUNTER — Telehealth: Payer: Self-pay

## 2022-12-02 DIAGNOSIS — M47816 Spondylosis without myelopathy or radiculopathy, lumbar region: Secondary | ICD-10-CM

## 2022-12-02 DIAGNOSIS — G8929 Other chronic pain: Secondary | ICD-10-CM

## 2022-12-02 MED ORDER — DIAZEPAM 5 MG PO TABS: ORAL_TABLET | ORAL | 0 refills | Status: DC

## 2022-12-02 NOTE — Telephone Encounter (Signed)
Patient scheduled for RFA 12/18/22. Needs pre procedure Valium sent to [facility]

## 2022-12-07 ENCOUNTER — Other Ambulatory Visit: Payer: Self-pay | Admitting: Podiatry

## 2022-12-09 ENCOUNTER — Other Ambulatory Visit: Payer: 59

## 2022-12-18 ENCOUNTER — Other Ambulatory Visit: Payer: Self-pay

## 2022-12-18 ENCOUNTER — Ambulatory Visit (INDEPENDENT_AMBULATORY_CARE_PROVIDER_SITE_OTHER): Payer: 59 | Admitting: Physical Medicine and Rehabilitation

## 2022-12-18 VITALS — BP 148/79 | HR 90

## 2022-12-18 DIAGNOSIS — M47816 Spondylosis without myelopathy or radiculopathy, lumbar region: Secondary | ICD-10-CM

## 2022-12-18 MED ORDER — METHYLPREDNISOLONE ACETATE 80 MG/ML IJ SUSP
80.0000 mg | Freq: Once | INTRAMUSCULAR | Status: AC
  Administered 2022-12-18: 80 mg

## 2022-12-18 NOTE — Progress Notes (Signed)
Functional Pain Scale - descriptive words and definitions  Distracting (5)    Aware of pain/able to complete some ADL's but limited by pain/sleep is affected and active distractions are only slightly useful. Moderate range order  Average Pain 8   +Driver, -BT, -Dye Allergies.  Lower back pain on both sides

## 2022-12-18 NOTE — Patient Instructions (Signed)

## 2022-12-24 NOTE — Progress Notes (Signed)
Miranda [name] - 67 y.o. female MRN [id number]  Date of birth: [birth year]  Office Visit Note: Visit Date: 12/18/2022 PCP: Miranda Contras, MD Referred by: Miranda Contras, MD  Subjective: Chief Complaint  Patient presents with   Lower Back - Pain   HPI:  Miranda [name] is a 67 y.o. female who comes in todayfor planned repeat radiofrequency ablation of the Bilateral L5-S1  Lumbar facet joints. This would be ablation of the corresponding medial branches and/or dorsal rami.  Patient has had double diagnostic blocks with more than 70% relief.  Subsequent ablation gave them more than 6 months of over 60% relief.  They have had chronic back pain for quite some time, more than 3 months, which has been an ongoing situation with recalcitrant axial back pain.  They have no radicular pain.  Their axial pain is worse with standing and ambulating and on exam today with facet loading.  They have had physical therapy as well as home exercise program.  The imaging noted in the chart below indicated facet pathology. Accordingly they meet all the criteria and qualification for for radiofrequency ablation and we are going to complete this today hopefully for more longer term relief as part of comprehensive management program.   ROS Otherwise per HPI.  Assessment & Plan: Visit Diagnoses:    ICD-10-CM   1. Spondylosis without myelopathy or radiculopathy, lumbar region  M47.816 XR C-ARM NO REPORT    Radiofrequency,Lumbar    methylPREDNISolone acetate (DEPO-MEDROL) injection 80 mg      Plan: No additional findings.   Meds & Orders:  Meds ordered this encounter  Medications   methylPREDNISolone acetate (DEPO-MEDROL) injection 80 mg    Orders Placed This Encounter  Procedures   Radiofrequency,Lumbar   XR C-ARM NO REPORT    Follow-up: Return for visit to requesting provider as needed.   Procedures: No procedures performed  Lumbar Facet Joint Nerve Denervation  Patient: Miranda [name]      Date of Birth: [birth year] MRN: [id number] PCP: Miranda Contras, MD      Visit Date: 12/18/2022   Universal Protocol:    Date/Time: 09/11/247:59 PM  Consent Given By: the patient  Position: PRONE  Additional Comments: Vital signs were monitored before and after the procedure. Patient was prepped and draped in the usual sterile fashion. The correct patient, procedure, and site was verified.   Injection Procedure Details:   Procedure diagnoses:  1. Spondylosis without myelopathy or radiculopathy, lumbar region      Meds Administered:  Meds ordered this encounter  Medications   methylPREDNISolone acetate (DEPO-MEDROL) injection 80 mg     Laterality: Bilateral  Location/Site:  L5-S1, L4 medial branch and L5 dorsal ramus  Needle: 18 ga.,  10mm active tip, RF Cannula  Needle Placement: Along juncture of superior articular process and transverse pocess  Findings:  -Comments:  Procedure Details: For each desired target nerve, the corresponding transverse process (sacral ala for the L5 dorsal rami) was identified and the fluoroscope was positioned to square off the endplates of the corresponding vertebral body to achieve a true AP midline view.  The beam was then obliqued 15 to 20 degrees and caudally tilted 15 to 20 degrees to line up a trajectory along the target nerves. The skin over the target of the junction of superior articulating process and transverse process (sacral ala for the L5 dorsal rami) was infiltrated with 1ml of 1% Lidocaine without Epinephrine.  The 18 gauge 10mm active tip outer  cannula was advanced in trajectory view to the target.  This procedure was repeated for each target nerve.  Then, for all levels, the outer cannula placement was fine-tuned and the position was then confirmed with bi-planar imaging.    Test stimulation was done both at sensory and motor levels to ensure there was no radicular stimulation. The target tissues were then  infiltrated with 1 ml of 1% Lidocaine without Epinephrine. Subsequently, a percutaneous neurotomy was carried out for 90 seconds at 80 degrees Celsius.  After the completion of the lesion, 1 ml of injectate was delivered. It was then repeated for each facet joint nerve mentioned above. Appropriate radiographs were obtained to verify the probe placement during the neurotomy.   Additional Comments:  The patient tolerated the procedure well Dressing: 2 x 2 sterile gauze and Band-Aid    Post-procedure details: Patient was observed during the procedure. Post-procedure instructions were reviewed.  Patient left the clinic in stable condition.      Clinical History: MRI LUMBAR SPINE WITHOUT CONTRAST     TECHNIQUE:  Multiplanar, multisequence MR imaging of the lumbar spine was  performed. No intravenous contrast was administered.     COMPARISON:  Lumbar radiograph 10/11/2014.  Lumbar MRI 04/12/2008.     FINDINGS:  Segmentation: Normal lumbar segmentation demonstrated on the  radiographs which is the same numbering system used on the 01-27-08 MRI.     Alignment:  Stable since 01/27/2008, preserved lumbar lordosis.     Vertebrae: Visualized bone marrow signal is within normal limits. No  marrow edema or evidence of acute osseous abnormality. Intact  visible sacrum and SI joints.     Conus medullaris: Extends to the L1 level and appears normal.     Paraspinal and other soft tissues: Negative.     Disc levels:     Visible lower thoracic levels are negative.     L1-L2: Disc desiccation and mild circumferential disc bulge have  developed since 01-27-2008, but there is no associated stenosis.     L2-L3: Stable minor circumferential disc bulge, mild facet  hypertrophy and trace chronic facet joint fluid. No stenosis.     L3-L4: Chronic circumferential disc bulge with broad-based mild  foraminal components. Mild facet and ligament flavum hypertrophy.  Regressed facet joint fluid. Increased mild  epidural lipomatosis.  However, no spinal or lateral recess stenosis. Up to mild left  greater than right L3 neural foraminal stenosis is stable since  01/27/2008.     L4-L5: Mild foraminal disc bulging greater on the left. Mild to  moderate facet and ligament flavum hypertrophy. No spinal or lateral  recess stenosis. Up to mild left L4 neural foraminal stenosis is  stable.     L5-S1: Moderate to severe bilateral facet hypertrophy. Normal disc.  Borderline to mild L5 foraminal stenosis is stable since 01/27/08.     IMPRESSION:  1. 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 The most pronounced degenerative finding is  moderate and severe lower lumbar facet arthropathy, maximal at  L5-S1. No acute osseous abnormality.  2. Mild disc degeneration has developed at L1-L2 since the 01/27/08 MRI,  but there is no associated stenosis.  3. No lumbar spinal or lateral recess stenosis. Up to mild neural  foraminal stenosis at the bilateral L3, the left L4, and the  bilateral L5 nerve levels is stable since 01-27-2008.        Electronically Signed    By: Althea Grimmer.D.  On: 01/24/2017 15:33     Objective:  VS:  HT:    WT:   BMI:     BP:(!) 148/79  HR:90bpm  TEMP: ( )  RESP:  Physical Exam Vitals and nursing note reviewed.  Constitutional:      General: She is not in acute distress.    Appearance: Normal appearance. She is not ill-appearing.  HENT:     Head: Normocephalic and atraumatic.     Right Ear: External ear normal.     Left Ear: External ear normal.  Eyes:     Extraocular Movements: Extraocular movements intact.  Cardiovascular:     Rate and Rhythm: Normal rate.     Pulses: Normal pulses.  Pulmonary:     Effort: Pulmonary effort is normal. No respiratory distress.  Abdominal:     General: There is no distension.     Palpations: Abdomen is soft.  Musculoskeletal:        General: Tenderness present.     Cervical back: Neck supple.     Right  lower leg: No edema.     Left lower leg: No edema.     Comments: Patient has good distal strength with no pain over the greater trochanters.  No clonus or focal weakness.  Skin:    Findings: No erythema, lesion or rash.  Neurological:     General: No focal deficit present.     Mental Status: She is alert and oriented to person, place, and time.     Sensory: No sensory deficit.     Motor: No weakness or abnormal muscle tone.     Coordination: Coordination normal.  Psychiatric:        Mood and Affect: Mood normal.        Behavior: Behavior normal.      Imaging: No results found.

## 2022-12-24 NOTE — Procedures (Signed)
Lumbar Facet Joint Nerve Denervation  Patient: Miranda [name]      Date of Birth: 04/03/1956 MRN: [id number] PCP: [name], MD      Visit Date: 12/18/2022   Universal Protocol:    Date/Time: 09/11/247:59 PM  Consent Given By: the patient  Position: PRONE  Additional Comments: Vital signs were monitored before and after the procedure. Patient was prepped and draped in the usual sterile fashion. The correct patient, procedure, and site was verified.   Injection Procedure Details:   Procedure diagnoses:  1. Spondylosis without myelopathy or radiculopathy, lumbar region      Meds Administered:  Meds ordered this encounter  Medications   methylPREDNISolone acetate (DEPO-MEDROL) injection 80 mg     Laterality: Bilateral  Location/Site:  L5-S1, L4 medial branch and L5 dorsal ramus  Needle: 18 ga.,  10mm active tip, RF Cannula  Needle Placement: Along juncture of superior articular process and transverse pocess  Findings:  -Comments:  Procedure Details: For each desired target nerve, the corresponding transverse process (sacral ala for the L5 dorsal rami) was identified and the fluoroscope was positioned to square off the endplates of the corresponding vertebral body to achieve a true AP midline view.  The beam was then obliqued 15 to 20 degrees and caudally tilted 15 to 20 degrees to line up a trajectory along the target nerves. The skin over the target of the junction of superior articulating process and transverse process (sacral ala for the L5 dorsal rami) was infiltrated with 1ml of 1% Lidocaine without Epinephrine.  The 18 gauge 10mm active tip outer cannula was advanced in trajectory view to the target.  This procedure was repeated for each target nerve.  Then, for all levels, the outer cannula placement was fine-tuned and the position was then confirmed with bi-planar imaging.    Test stimulation was done both at sensory and motor levels to ensure there  was no radicular stimulation. The target tissues were then infiltrated with 1 ml of 1% Lidocaine without Epinephrine. Subsequently, a percutaneous neurotomy was carried out for 90 seconds at 80 degrees Celsius.  After the completion of the lesion, 1 ml of injectate was delivered. It was then repeated for each facet joint nerve mentioned above. Appropriate radiographs were obtained to verify the probe placement during the neurotomy.   Additional Comments:  The patient tolerated the procedure well Dressing: 2 x 2 sterile gauze and Band-Aid    Post-procedure details: Patient was observed during the procedure. Post-procedure instructions were reviewed.  Patient left the clinic in stable condition.

## 2023-01-01 ENCOUNTER — Ambulatory Visit: Payer: 59 | Admitting: Podiatry

## 2023-01-02 NOTE — Therapy (Deleted)
OUTPATIENT PHYSICAL THERAPY THORACOLUMBAR EVALUATION   Patient Name: Miranda [name] MRN: [id number] DOB:[birth year], 67 y.o., female Today's Date: 01/06/2023  END OF SESSION:   Past Medical History:  Diagnosis Date   Acid reflux    Anxiety    Asthma    Degenerative disc disease    Depression    Diabetes mellitus    Hypertension    Hypoglycemia 12/30/2016   Stroke (HCC)    04/22/12   Past Surgical History:  Procedure Laterality Date   CARDIAC CATHETERIZATION N/A 11/21/2015   Procedure: Left Heart Cath and Coronary Angiography;  Surgeon: Peter M Swazil[place], MD;  Location: [facility] INVASIVE CV LAB;  Service: Cardiovascular;  Laterality: N/A;   CARDIAC CATHETERIZATION N/A 11/21/2015   Procedure: Coronary Stent Intervention;  Surgeon: Peter M [place], MD;  Location[facility] INVASIVE CV LAB;  Service: Cardiovascular;  Laterality: N/A;   KNEE SURGERY     TUBAL LIGATION     Patient Active Problem List   Diagnosis Date Noted   Peripheral arterial disease (HCC) 02/18/2022   Unilateral primary osteoarthritis, right knee 03/27/2021   Unilateral primary osteoarthritis, left knee 03/27/2021   Acute right-sided low back pain without sciatica    Peripheral neuropathy 02/08/2018   Acute pancreatitis 02/07/2018   CAD S/P percutaneous coronary angioplasty 01/29/2017   Tobacco abuse 01/29/2017   Chronic back pain 01/29/2017   Hypoglycemia 12/30/2016   Insulin dependent diabetes mellitus 11/21/2015   Dyslipidemia 11/21/2015   NSTEMI (non-ST elevated myocardial infarction) (HCC)    TIA (transient ischemic attack) 05/31/2014   History of stroke 04/22/2012   Hypokalemia 04/22/2012   Diabetes mellitus (HCC) 04/22/2012   Essential hypertension 04/22/2012    PCP: [name], MD   REFERRING PROVIDER: Merryl Hacker, NP  REFERRING DIAG: multilevel degenerative dosc disease  Rationale for Evaluation and Treatment: Rehabilitation  THERAPY DIAG:  No diagnosis found.  ONSET DATE:  chronic  SUBJECTIVE:                                                                                                                                                                                           SUBJECTIVE STATEMENT: ***  PERTINENT HISTORY:  HPI:  Miranda [name] is a 67 y.o. female who comes in todayfor planned repeat radiofrequency ablation of the Bilateral L5-S1  Lumbar facet joints. This would be ablation of the corresponding medial branches and/or dorsal rami.  Patient has had double diagnostic blocks with more than 70% relief.  Subsequent ablation gave them more than 6 months of over 60% relief.  They have had chronic back pain for quite some time, more than 3 months, which  has been an ongoing situation with recalcitrant axial back pain.  They have no radicular pain.  Their axial pain is worse with standing and ambulating and on exam today with facet loading.  They have had physical therapy as well as home exercise program.  The imaging noted in the chart below indicated facet pathology. Accordingly they meet all the criteria and qualification for for radiofrequency ablation and we are going to complete this today hopefully for more longer term relief as part of comprehensive management program.  PAIN:  Are you having pain? Yes: {yespain:27235::"NPRS scale: ***/10","Pain location: ***","Pain description: ***","Aggravating factors: ***","Relieving factors: ***"}  PRECAUTIONS: Back  RED FLAGS: None   WEIGHT BEARING RESTRICTIONS: No  FALLS:  Has patient fallen in last 6 months? No  OCCUPATION: ***  PLOF: Independent  PATIENT GOALS: To manage my back symptoms  NEXT MD VISIT: ***  OBJECTIVE:   DIAGNOSTIC FINDINGS:  None available  PATIENT SURVEYS:  FOTO ***  MUSCLE LENGTH: Hamstrings: Right *** deg; Left *** deg Thomas test: Right *** deg; Left *** deg  POSTURE: {posture:25561}  PALPATION: ***  LUMBAR ROM:   AROM eval  Flexion   Extension   Right  lateral flexion   Left lateral flexion   Right rotation   Left rotation    (Blank rows = not tested)  LOWER EXTREMITY ROM:     {AROM/PROM:27142}  Right eval Left eval  Hip flexion    Hip extension    Hip abduction    Hip adduction    Hip internal rotation    Hip external rotation    Knee flexion    Knee extension    Ankle dorsiflexion    Ankle plantarflexion    Ankle inversion    Ankle eversion     (Blank rows = not tested)  LOWER EXTREMITY MMT:    MMT Right eval Left eval  Hip flexion    Hip extension    Hip abduction    Hip adduction    Hip internal rotation    Hip external rotation    Knee flexion    Knee extension    Ankle dorsiflexion    Ankle plantarflexion    Ankle inversion    Ankle eversion     (Blank rows = not tested)  LUMBAR SPECIAL TESTS:  Straight leg raise test: {pos/neg:25243}, Slump test: {pos/neg:25243}, FABER test: {pos/neg:25243}, and Thomas test: {pos/neg:25243}  FUNCTIONAL TESTS:  5 times sit to stand: ***  GAIT: Distance walked: 10ft x2 Assistive device utilized: None Level of assistance: Complete Independence Comments: ***  TODAY'S TREATMENT:                                                                                                                              DATE: 01/06/23    PATIENT EDUCATION:  Education details: Discussed eval findings, rehab rationale and POC and patient is in agreement  Person educated: Patient Education method: Explanation Education comprehension: verbalized understanding  and needs further education  HOME EXERCISE PROGRAM: ***  ASSESSMENT:  CLINICAL IMPRESSION: Patient is a 67 y.o. female who was seen today for physical therapy evaluation and treatment for ***.   OBJECTIVE IMPAIRMENTS: {opptimpairments:25111}.   ACTIVITY LIMITATIONS: {activitylimitations:27494}  PARTICIPATION LIMITATIONS: {participationrestrictions:25113}  PERSONAL FACTORS: Age, Fitness, and 1 comorbidity: *** are also  affecting patient's functional outcome.   REHAB POTENTIAL: Good  CLINICAL DECISION MAKING: Evolving/moderate complexity  EVALUATION COMPLEXITY: Low   GOALS: Goals reviewed with patient? No  SHORT TERM GOALS: Target date: ***  *** Baseline: Goal status: INITIAL  2.  *** Baseline:  Goal status: INITIAL  3.  *** Baseline:  Goal status: INITIAL  4.  *** Baseline:  Goal status: INITIAL  5.  *** Baseline:  Goal status: INITIAL  6.  *** Baseline:  Goal status: INITIAL  LONG TERM GOALS: Target date: ***  *** Baseline:  Goal status: INITIAL  2.  *** Baseline:  Goal status: INITIAL  3.  *** Baseline:  Goal status: INITIAL  4.  *** Baseline:  Goal status: INITIAL  5.  *** Baseline:  Goal status: INITIAL  6.  *** Baseline:  Goal status: INITIAL  PLAN:  PT FREQUENCY: 1-2x/week  PT DURATION: 6 weeks  PLANNED INTERVENTIONS: Therapeutic exercises, Therapeutic activity, Neuromuscular re-education, Balance training, Gait training, Patient/Family education, Self Care, Joint mobilization, DME instructions, Aquatic Therapy, Dry Needling, Electrical stimulation, Spinal mobilization, Cryotherapy, Moist heat, Manual therapy, and Re-evaluation.  PLAN FOR NEXT SESSION: HEP review and update, manual techniques as appropriate, aerobic tasks, ROM and flexibility activities, strengthening and PREs, TPDN, gait and balance training as needed     Hildred Laser, PT 01/06/2023, 2:05 PM

## 2023-01-06 ENCOUNTER — Ambulatory Visit: Payer: 59

## 2023-01-15 ENCOUNTER — Ambulatory Visit: Payer: 59 | Admitting: Podiatry

## 2023-01-15 DIAGNOSIS — Z91199 Patient's noncompliance with other medical treatment and regimen due to unspecified reason: Secondary | ICD-10-CM

## 2023-01-15 NOTE — Progress Notes (Signed)
NS  NC 

## 2023-01-19 ENCOUNTER — Other Ambulatory Visit: Payer: 59

## 2023-01-22 ENCOUNTER — Ambulatory Visit: Payer: 59 | Admitting: Podiatry

## 2023-01-22 DIAGNOSIS — Z91199 Patient's noncompliance with other medical treatment and regimen due to unspecified reason: Secondary | ICD-10-CM

## 2023-01-22 NOTE — Progress Notes (Signed)
 Patient absent for apointment

## 2023-02-02 ENCOUNTER — Ambulatory Visit (HOSPITAL_COMMUNITY)
Admission: EM | Admit: 2023-02-02 | Discharge: 2023-02-02 | Disposition: A | Discharge status: Home / Self Care | Payer: 59 | Attending: Internal Medicine | Admitting: Internal Medicine

## 2023-02-02 ENCOUNTER — Encounter (HOSPITAL_COMMUNITY): Payer: Self-pay

## 2023-02-02 DIAGNOSIS — N760 Acute vaginitis: Secondary | ICD-10-CM | POA: Diagnosis not present

## 2023-02-02 NOTE — ED Triage Notes (Signed)
Pt states has a "splinter embedded in vaginal for 2 weeks." States unknown how but tender to touch. Denies taking meds.

## 2023-02-02 NOTE — Discharge Instructions (Addendum)
Please avoid sexual intercourse until lab results are available I did not see any splinter in your private parts when I examined Vaginal discharge was seen.  We have sent samples for testing Will call you with recommendations if labs are abnormal If you have worsening symptoms please return to urgent care to be reevaluated.

## 2023-02-02 NOTE — ED Notes (Signed)
This RN reviewed pt's AVS at this time. Pt informed she will be notified of her results if positive. Pt verbalizes she has MyChart to check her results.

## 2023-02-03 ENCOUNTER — Telehealth: Payer: Self-pay

## 2023-02-03 LAB — CERVICOVAGINAL ANCILLARY ONLY
Bacterial Vaginitis (gardnerella): NEGATIVE
Candida Glabrata: POSITIVE — AB
Candida Vaginitis: POSITIVE — AB
Chlamydia: NEGATIVE
Comment: NEGATIVE
Comment: NEGATIVE
Comment: NEGATIVE
Comment: NEGATIVE
Comment: NEGATIVE
Comment: NORMAL
Neisseria Gonorrhea: NEGATIVE
Trichomonas: NEGATIVE

## 2023-02-03 MED ORDER — FLUCONAZOLE 150 MG PO TABS: 150.0000 mg | ORAL_TABLET | Freq: Once | ORAL | 0 refills | Status: AC

## 2023-02-03 NOTE — ED Provider Notes (Signed)
[facility]    CSN: [id number] Arrival date & time: 02/02/23  1834      History   Chief Complaint Chief Complaint  Patient presents with   vaginal discomfort    HPI Miranda [name] is a 67 y.o. female comes to the urgent care complaining of vaginal discomfort.  Patient's symptoms started 2 weeks ago and has been persistent.  She denies any dysuria urgency or frequency.  She complains of superficial dyspareunia during sexual intercourse.  She has mild itching in the vaginal area.  Patient feels that there is an embedded splinter in the vaginal area.  Patient endorses that she saw a splinter in the vagina area.  HPI  Past Medical History:  Diagnosis Date   Acid reflux    Anxiety    Asthma    Degenerative disc disease    Depression    Diabetes mellitus    Hypertension    Hypoglycemia 12/30/2016   Stroke (HCC)    04/22/12    Patient Active Problem List   Diagnosis Date Noted   Peripheral arterial disease (HCC) 02/18/2022   Unilateral primary osteoarthritis, right knee 03/27/2021   Unilateral primary osteoarthritis, left knee 03/27/2021   Acute right-sided low back pain without sciatica    Peripheral neuropathy 02/08/2018   Acute pancreatitis 02/07/2018   CAD S/P percutaneous coronary angioplasty 01/29/2017   Tobacco abuse 01/29/2017   Chronic back pain 01/29/2017   Hypoglycemia 12/30/2016   Insulin dependent diabetes mellitus 11/21/2015   Dyslipidemia 11/21/2015   NSTEMI (non-ST elevated myocardial infarction) [facility])    TIA (transient ischemic attack) 05/31/2014   History of stroke 04/22/2012   Hypokalemia 04/22/2012   Diabetes mellitus (HCC) 04/22/2012   Essential hypertension 04/22/2012    Past Surgical History:  Procedure Laterality Date   CARDIAC CATHETERIZATION N/A 11/21/2015   Procedure: Left Heart Cath and Coronary Angiography;  Surgeon: Peter M Swaziland, MD;  Location: [facility] INVASIVE CV LAB;  Service: Cardiovascular;  Laterality: N/A;   CARDIAC  CATHETERIZATION N/A 11/21/2015   Procedure: Coronary Stent Intervention;  Surgeon: Peter M Swaziland, MD;  Location: [facility] INVASIVE CV LAB;  Service: Cardiovascular;  Laterality: N/A;   KNEE SURGERY     TUBAL LIGATION      OB History   No obstetric history on file.      Home Medications    Prior to Admission medications   Medication Sig Start Date End Date Taking? Authorizing Provider  ACCU-CHEK AVIVA PLUS test strip 1 each as needed. 10/14/16   [provider]  albuterol (PROVENTIL HFA;VENTOLIN HFA) 108 (90 Base) MCG/ACT inhaler Inhale 2 puffs into the lungs every 6 (six) hours as needed for wheezing or shortness of breath.    [provider]  amLODipine (NORVASC) 5 MG tablet Take 5 mg by mouth daily. 08/14/19   [provider]  atorvastatin (LIPITOR) 80 MG tablet TAKE 1 TABLET EVERY DAY AT 6 PM 06/21/20   Swaziland, Peter M, MD  B-D ULTRAFINE III SHORT PEN 31G X 8 MM MISC 1 each as needed. 11/18/16   [provider]  buPROPion (WELLBUTRIN SR) 150 MG 12 hr tablet Take 150 mg by mouth daily. 02/09/22   [provider]  carvedilol (COREG) 12.5 MG tablet TAKE 1 TABLET (12.5MG  TOTAL) BY MOUTH TWICE A DAY WITH MEALS 08/11/22   [name], MD  cilostazol (PLETAL) 50 MG tablet TAKE 1 TABLET BY MOUTH TWICE A DAY 11/14/22   [name], MD  cloNIDine (CATAPRES)  0.1 MG tablet Take 0.1 mg by mouth daily as needed (sleep).  08/14/19   [provider]  clopidogrel (PLAVIX) 75 MG tablet Take 1 tablet (75 mg total) by mouth daily. 08/01/22   Jodelle Gross, NP  Cyanocobalamin (B-12) 1000 MCG CAPS Take 1,000 mcg by mouth daily.     [provider]  cyclobenzaprine (FLEXERIL) 10 MG tablet Take 10 mg by mouth 3 (three) times daily. 01/31/22   [provider]  diazepam (VALIUM) 5 MG tablet Take one tablet by mouth with food one hour prior to procedure. May repeat 30 minutes prior if needed. 12/02/22   Juanda Chance, NP  diclofenac sodium  (VOLTAREN) 1 % GEL Apply 4 g topically 4 (four) times daily. 02/11/18   Calvert Cantor, MD  diclofenac Sodium (VOLTAREN) 1 % GEL Apply 1 application topically 4 (four) times daily as needed for pain. 08/31/19   [provider]  fluconazole (DIFLUCAN) 150 MG tablet SMARTSIG:1 Tablet(s) By Mouth Once a Week PRN 06/26/22   [provider]  fluticasone (FLONASE) 50 MCG/ACT nasal spray Place 2 sprays into both nostrils as needed. 05/18/22   [provider]  furosemide (LASIX) 20 MG tablet Take 1 tablet (20 mg total) by mouth daily. 09/05/19   Lorre Nick, MD  gabapentin (NEURONTIN) 300 MG capsule Take 300 mg by mouth 3 (three) times daily.     [provider]  Glycerin, Adult, 2.1 g SUPP Place 1 suppository rectally daily as needed for moderate constipation. 02/11/18   Calvert Cantor, MD  JARDIANCE 10 MG TABS tablet Take 10 mg by mouth daily. 01/13/22   [provider]  ketoconazole (NIZORAL) 2 % cream APPLY 1 APPLICATION TOPICALLY DAILY 12/08/22   Standiford, Jenelle Mages, DPM  LANTUS SOLOSTAR 100 UNIT/ML Solostar Pen Inject 50 Units into the skin in the morning and at bedtime. 08/15/19   [provider]  metFORMIN (GLUCOPHAGE) 1000 MG tablet Take 1,000 mg by mouth 2 (two) times daily. 08/14/19   [provider]  metFORMIN (GLUMETZA) 1000 MG (MOD) 24 hr tablet Take 1 tablet (1,000 mg total) by mouth 2 (two) times daily with a meal. Restart tomorrow Patient not taking: Reported on 08/01/2022 11/23/15   Zannie Cove, MD  metroNIDAZOLE (FLAGYL) 500 MG tablet Take 1 tablet (500 mg total) by mouth 2 (two) times daily. 04/16/22   LampteyBritta Mccreedy, MD  MOVANTIK 25 MG TABS tablet Take 25 mg by mouth daily before breakfast.  12/12/16   [provider]  nystatin cream (MYCOSTATIN) Apply 1 Application topically as needed. 07/31/22   [provider]  ondansetron (ZOFRAN ODT) 4 MG disintegrating tablet Take 1 tablet (4 mg total) by mouth every 6  (six) hours as needed. 02/05/18   Ward, Layla Maw, DO  oxyCODONE-acetaminophen (PERCOCET) 7.5-325 MG tablet Take 1 tablet by mouth every 4 (four) hours as needed for severe pain.    [provider]  polyethylene glycol (MIRALAX / GLYCOLAX) packet Take 17 g by mouth daily as needed. 02/11/18   Calvert Cantor, MD  potassium chloride (KLOR-CON) 10 MEQ tablet Take 1 tablet (10 mEq total) by mouth daily. 09/05/19   Lorre Nick, MD  traZODone (DESYREL) 50 MG tablet Take 50 mg by mouth at bedtime as needed for sleep.     [provider]  Vitamin D, Ergocalciferol, (DRISDOL) 50000 units CAPS capsule Take 50,000 Units by mouth every 7 (seven) days. monday    [provider]    Family  History Family History  Problem Relation Age of Onset   Hypertension Other     Social History Social History   Tobacco Use   Smoking status: Every Day    Current packs/day: 0.25    Average packs/day: 0.3 packs/day for 45.0 years (11.3 ttl pk-yrs)    Types: Cigarettes   Smokeless tobacco: Never  Vaping Use   Vaping status: Never Used  Substance Use Topics   Alcohol use: No   Drug use: No     Allergies   Patient has no known allergies.   Review of Systems Review of Systems As per HPI  Physical Exam Triage Vital Signs ED Triage Vitals  Encounter Vitals Group     BP 02/02/23 1923 (!) 182/97     Systolic BP Percentile --      Diastolic BP Percentile --      Pulse Rate 02/02/23 1923 77     Resp 02/02/23 1923 20     Temp 02/02/23 1923 98.5 F (36.9 C)     Temp Source 02/02/23 1923 Oral     SpO2 02/02/23 1923 98 %     Weight --      Height --      Head Circumference --      Peak Flow --      Pain Score 02/02/23 1924 6     Pain Loc --      Pain Education --      Exclude from Growth Chart --    No data found.  Updated Vital Signs BP (!) 182/97 (BP Location: Right Arm)   Pulse 77   Temp 98.5 F (36.9 C) (Oral)   Resp 20   SpO2 98%   Visual Acuity Right Eye  Distance:   Left Eye Distance:   Bilateral Distance:    Right Eye Near:   Left Eye Near:    Bilateral Near:     Physical Exam Vitals and nursing note reviewed. Exam conducted with a chaperone present.  Constitutional:      General: She is not in acute distress.    Appearance: She is not ill-appearing.  Cardiovascular:     Rate and Rhythm: Normal rate and regular rhythm.     Pulses: Normal pulses.     Heart sounds: Normal heart sounds.  Pulmonary:     Effort: Pulmonary effort is normal.     Breath sounds: Normal breath sounds.  Abdominal:     General: Bowel sounds are normal.     Palpations: Abdomen is soft.  Genitourinary:    General: Normal vulva.     Vagina: Vaginal discharge present.     Comments: No splinter visualized. Neurological:     Mental Status: She is alert.      UC Treatments / Results  Labs (all labs ordered are listed, but only abnormal results are displayed) Labs Reviewed  CERVICOVAGINAL ANCILLARY ONLY - Abnormal; Notable for the following components:      Result Value   Candida Vaginitis Positive (*)    Candida Glabrata Positive (*)    All other components within normal limits    EKG   Radiology No results found.  Procedures Procedures (including critical care time)  Medications Ordered in UC Medications - No data to display  Initial Impression / Assessment and Plan / UC Course  I have reviewed the triage vital signs and the nursing notes.  Pertinent labs & imaging results that were available during my care of the patient were reviewed by  me and considered in my medical decision making (see chart for details).     1.  Acute vaginitis: Cervicovaginal swab for STI evaluation, bacterial vaginosis and yeast Reassurance given Patient is given return precautions Will reach out to the patient with lab results. Final Clinical Impressions(s) / UC Diagnoses   Final diagnoses:  Acute vaginitis     Discharge Instructions      Please  avoid sexual intercourse until lab results are available I did not see any splinter in your private parts when I examined Vaginal discharge was seen.  We have sent samples for testing Will call you with recommendations if labs are abnormal If you have worsening symptoms please return to urgent care to be reevaluated.   ED Prescriptions   None    PDMP not reviewed this encounter.   Merrilee Jansky, MD 02/03/23 [phone]

## 2023-02-03 NOTE — Telephone Encounter (Signed)
Per protocol, pt requires tx with Diflucan. Attempted to reach patient x1. LVM. Rx sent to pharmacy on file.

## 2023-02-04 ENCOUNTER — Ambulatory Visit: Payer: 59 | Admitting: Cardiovascular Disease

## 2023-03-06 ENCOUNTER — Ambulatory Visit: Payer: 59 | Admitting: Podiatry

## 2023-03-06 ENCOUNTER — Other Ambulatory Visit: Payer: 59

## 2023-03-23 ENCOUNTER — Telehealth: Payer: Self-pay

## 2023-03-23 ENCOUNTER — Other Ambulatory Visit: Payer: Self-pay | Admitting: Physical Medicine and Rehabilitation

## 2023-03-23 DIAGNOSIS — M545 Low back pain, unspecified: Secondary | ICD-10-CM

## 2023-03-23 NOTE — Telephone Encounter (Signed)
Patient called in stating she is in extreme pain. She said that the procedure she received didn't help at all and she is now cripple she is bent over. She stated it did help the first time but not the second. She stated she does not want to have the ablastins done again. What are some of the next steps ?

## 2023-04-10 ENCOUNTER — Inpatient Hospital Stay (HOSPITAL_COMMUNITY)
Admission: EM | Admit: 2023-04-10 | Discharge: 2023-04-14 | DRG: 322 | Disposition: A | Discharge status: Home / Self Care | Payer: 59 | Attending: Cardiology | Admitting: Cardiology

## 2023-04-10 ENCOUNTER — Other Ambulatory Visit: Payer: Self-pay

## 2023-04-10 ENCOUNTER — Encounter (HOSPITAL_COMMUNITY): Payer: Self-pay | Admitting: Cardiology

## 2023-04-10 ENCOUNTER — Ambulatory Visit: Payer: 59 | Admitting: Cardiovascular Disease

## 2023-04-10 ENCOUNTER — Emergency Department (HOSPITAL_COMMUNITY): Payer: 59

## 2023-04-10 DIAGNOSIS — K219 Gastro-esophageal reflux disease without esophagitis: Secondary | ICD-10-CM | POA: Diagnosis present

## 2023-04-10 DIAGNOSIS — E1151 Type 2 diabetes mellitus with diabetic peripheral angiopathy without gangrene: Secondary | ICD-10-CM | POA: Diagnosis present

## 2023-04-10 DIAGNOSIS — Z720 Tobacco use: Secondary | ICD-10-CM | POA: Diagnosis present

## 2023-04-10 DIAGNOSIS — Z794 Long term (current) use of insulin: Secondary | ICD-10-CM | POA: Diagnosis not present

## 2023-04-10 DIAGNOSIS — Z8249 Family history of ischemic heart disease and other diseases of the circulatory system: Secondary | ICD-10-CM | POA: Diagnosis not present

## 2023-04-10 DIAGNOSIS — I252 Old myocardial infarction: Secondary | ICD-10-CM | POA: Diagnosis not present

## 2023-04-10 DIAGNOSIS — I249 Acute ischemic heart disease, unspecified: Principal | ICD-10-CM

## 2023-04-10 DIAGNOSIS — I160 Hypertensive urgency: Secondary | ICD-10-CM | POA: Diagnosis not present

## 2023-04-10 DIAGNOSIS — F32A Depression, unspecified: Secondary | ICD-10-CM | POA: Diagnosis present

## 2023-04-10 DIAGNOSIS — I2510 Atherosclerotic heart disease of native coronary artery without angina pectoris: Secondary | ICD-10-CM | POA: Diagnosis present

## 2023-04-10 DIAGNOSIS — R350 Frequency of micturition: Secondary | ICD-10-CM | POA: Diagnosis present

## 2023-04-10 DIAGNOSIS — F419 Anxiety disorder, unspecified: Secondary | ICD-10-CM | POA: Diagnosis present

## 2023-04-10 DIAGNOSIS — Z7984 Long term (current) use of oral hypoglycemic drugs: Secondary | ICD-10-CM | POA: Diagnosis not present

## 2023-04-10 DIAGNOSIS — I200 Unstable angina: Secondary | ICD-10-CM

## 2023-04-10 DIAGNOSIS — F17210 Nicotine dependence, cigarettes, uncomplicated: Secondary | ICD-10-CM | POA: Diagnosis present

## 2023-04-10 DIAGNOSIS — I214 Non-ST elevation (NSTEMI) myocardial infarction: Principal | ICD-10-CM | POA: Diagnosis present

## 2023-04-10 DIAGNOSIS — I10 Essential (primary) hypertension: Secondary | ICD-10-CM | POA: Diagnosis present

## 2023-04-10 DIAGNOSIS — E1165 Type 2 diabetes mellitus with hyperglycemia: Secondary | ICD-10-CM | POA: Diagnosis present

## 2023-04-10 DIAGNOSIS — Z8673 Personal history of transient ischemic attack (TIA), and cerebral infarction without residual deficits: Secondary | ICD-10-CM | POA: Diagnosis not present

## 2023-04-10 DIAGNOSIS — E785 Hyperlipidemia, unspecified: Secondary | ICD-10-CM | POA: Diagnosis present

## 2023-04-10 DIAGNOSIS — Z955 Presence of coronary angioplasty implant and graft: Secondary | ICD-10-CM

## 2023-04-10 DIAGNOSIS — Z7902 Long term (current) use of antithrombotics/antiplatelets: Secondary | ICD-10-CM | POA: Diagnosis not present

## 2023-04-10 DIAGNOSIS — Z79899 Other long term (current) drug therapy: Secondary | ICD-10-CM | POA: Diagnosis not present

## 2023-04-10 DIAGNOSIS — N390 Urinary tract infection, site not specified: Secondary | ICD-10-CM | POA: Insufficient documentation

## 2023-04-10 DIAGNOSIS — E669 Obesity, unspecified: Secondary | ICD-10-CM | POA: Diagnosis present

## 2023-04-10 DIAGNOSIS — E119 Type 2 diabetes mellitus without complications: Secondary | ICD-10-CM

## 2023-04-10 DIAGNOSIS — R079 Chest pain, unspecified: Secondary | ICD-10-CM | POA: Diagnosis not present

## 2023-04-10 HISTORY — DX: Atherosclerotic heart disease of native coronary artery without angina pectoris: I25.10

## 2023-04-10 HISTORY — DX: Peripheral vascular disease, unspecified: I73.9

## 2023-04-10 LAB — CBC
HCT: 48.1 % — ABNORMAL HIGH (ref 36.0–46.0)
Hemoglobin: 14.8 g/dL (ref 12.0–15.0)
MCH: 27.6 pg (ref 26.0–34.0)
MCHC: 30.8 g/dL (ref 30.0–36.0)
MCV: 89.6 fL (ref 80.0–100.0)
Platelets: 303 10*3/uL (ref 150–400)
RBC: 5.37 MIL/uL — ABNORMAL HIGH (ref 3.87–5.11)
RDW: 14.2 % (ref 11.5–15.5)
WBC: 6.8 10*3/uL (ref 4.0–10.5)
nRBC: 0 % (ref 0.0–0.2)

## 2023-04-10 LAB — URINALYSIS, W/ REFLEX TO CULTURE (INFECTION SUSPECTED)
Bilirubin Urine: NEGATIVE
Glucose, UA: >=500 mg/dL — AB
Hgb urine dipstick: NEGATIVE
Ketones, ur: NEGATIVE mg/dL
Nitrite: NEGATIVE
Protein, ur: NEGATIVE mg/dL
Specific Gravity, Urine: 1.035 — ABNORMAL HIGH (ref 1.005–1.030)
pH: 6 (ref 5.0–8.0)

## 2023-04-10 LAB — TROPONIN I (HIGH SENSITIVITY)
Troponin I (High Sensitivity): 263 ng/L (ref <18)
Troponin I (High Sensitivity): 249 ng/L (ref <18)

## 2023-04-10 LAB — BASIC METABOLIC PANEL
Anion gap: 11 (ref 5–15)
BUN: 12 mg/dL (ref 8–23)
CO2: 24 mmol/L (ref 22–32)
Calcium: 9.2 mg/dL (ref 8.9–10.3)
Chloride: 106 mmol/L (ref 98–111)
Creatinine, Ser: 0.82 mg/dL (ref 0.44–1.00)
GFR, Estimated: >60 mL/min (ref >60)
Glucose, Bld: 173 mg/dL — ABNORMAL HIGH (ref 70–99)
Potassium: 3.8 mmol/L (ref 3.5–5.1)
Sodium: 141 mmol/L (ref 135–145)

## 2023-04-10 LAB — D-DIMER, QUANTITATIVE: D-Dimer, Quant: 0.34 ug{FEU}/mL (ref 0.00–0.50)

## 2023-04-10 LAB — BRAIN NATRIURETIC PEPTIDE: B Natriuretic Peptide: 288.7 pg/mL — ABNORMAL HIGH (ref 0.0–100.0)

## 2023-04-10 MED ORDER — HEPARIN BOLUS VIA INFUSION
4000.0000 [IU] | Freq: Once | INTRAVENOUS | Status: AC
  Administered 2023-04-10: 4000 [IU] via INTRAVENOUS
  Filled 2023-04-10: qty 4000

## 2023-04-10 MED ORDER — ASPIRIN 81 MG PO CHEW
324.0000 mg | CHEWABLE_TABLET | Freq: Once | ORAL | Status: AC
  Administered 2023-04-10: 324 mg via ORAL
  Filled 2023-04-10: qty 4

## 2023-04-10 MED ORDER — ONDANSETRON HCL 4 MG/2ML IJ SOLN
4.0000 mg | Freq: Four times a day (QID) | INTRAMUSCULAR | Status: DC | PRN
  Administered 2023-04-13: 4 mg via INTRAVENOUS

## 2023-04-10 MED ORDER — ASPIRIN 81 MG PO TBEC
81.0000 mg | DELAYED_RELEASE_TABLET | Freq: Every day | ORAL | Status: DC
  Administered 2023-04-11 – 2023-04-14 (×3): 81 mg via ORAL
  Filled 2023-04-10 (×3): qty 1

## 2023-04-10 MED ORDER — CYCLOBENZAPRINE HCL 10 MG PO TABS
10.0000 mg | ORAL_TABLET | Freq: Three times a day (TID) | ORAL | Status: DC
  Administered 2023-04-10 – 2023-04-14 (×10): 10 mg via ORAL
  Filled 2023-04-10 (×12): qty 1

## 2023-04-10 MED ORDER — CLOPIDOGREL BISULFATE 75 MG PO TABS
75.0000 mg | ORAL_TABLET | Freq: Every day | ORAL | Status: DC
  Administered 2023-04-11 – 2023-04-13 (×3): 75 mg via ORAL
  Filled 2023-04-10 (×3): qty 1

## 2023-04-10 MED ORDER — CARVEDILOL 12.5 MG PO TABS
12.5000 mg | ORAL_TABLET | Freq: Two times a day (BID) | ORAL | Status: DC
  Administered 2023-04-11 – 2023-04-14 (×6): 12.5 mg via ORAL
  Filled 2023-04-10 (×6): qty 1

## 2023-04-10 MED ORDER — HEPARIN (PORCINE) 25000 UT/250ML-% IV SOLN
1450.0000 [IU]/h | INTRAVENOUS | Status: DC
  Administered 2023-04-10: 1200 [IU]/h via INTRAVENOUS
  Administered 2023-04-12 – 2023-04-13 (×2): 1450 [IU]/h via INTRAVENOUS
  Filled 2023-04-10 (×4): qty 250

## 2023-04-10 MED ORDER — ATORVASTATIN CALCIUM 80 MG PO TABS
80.0000 mg | ORAL_TABLET | Freq: Every day | ORAL | Status: DC
  Administered 2023-04-10 – 2023-04-14 (×4): 80 mg via ORAL
  Filled 2023-04-10 (×4): qty 1

## 2023-04-10 MED ORDER — GABAPENTIN 300 MG PO CAPS
300.0000 mg | ORAL_CAPSULE | Freq: Three times a day (TID) | ORAL | Status: DC
  Administered 2023-04-10 – 2023-04-14 (×10): 300 mg via ORAL
  Filled 2023-04-10 (×10): qty 1

## 2023-04-10 MED ORDER — AMLODIPINE BESYLATE 5 MG PO TABS
5.0000 mg | ORAL_TABLET | Freq: Every day | ORAL | Status: DC
  Administered 2023-04-11: 5 mg via ORAL
  Filled 2023-04-10: qty 1

## 2023-04-10 MED ORDER — ACETAMINOPHEN 325 MG PO TABS
650.0000 mg | ORAL_TABLET | ORAL | Status: DC | PRN
  Administered 2023-04-11 – 2023-04-12 (×2): 650 mg via ORAL
  Filled 2023-04-10 (×2): qty 2

## 2023-04-10 MED ORDER — NITROGLYCERIN 0.4 MG SL SUBL: 0.4000 mg | SUBLINGUAL_TABLET | SUBLINGUAL | Status: DC | PRN

## 2023-04-10 MED ORDER — TRAZODONE HCL 50 MG PO TABS
50.0000 mg | ORAL_TABLET | Freq: Every evening | ORAL | Status: DC | PRN
  Administered 2023-04-10 – 2023-04-12 (×3): 50 mg via ORAL
  Filled 2023-04-10 (×3): qty 1

## 2023-04-10 NOTE — ED Provider Notes (Signed)
[place] EMERGENCY DEPARTMENT AT [facility] Provider Note  HPI   Miranda [name] is a 67 y.o. female patient with a PMHx of CAD status post PCI back in 2017 for left circumflex lesion, diabetes hypertension obesity hyperlipidemia who is here today with concern for chest pain.  Patient says that for the past week, she has had midline chest pain, burning, goes up and down the mid chest, has had some shortness of breath.  She does not really say that this is exertional, is in fact actually worse only at night, she has had some scattered pain during the day, but is worse when she lays flat she feels like.  She is here today with persistent of symptoms, and potentially worsening of symptoms given that this become a little bit more prevalent during the day.  She is not actively having chest pain right now  ROS Negative except as per HPI   Medical Decision Making   Upon presentation, the patient is hypertensive to 190s, afebrile otherwise hemodynamically stable.  She has an trace pedal edema but no other signs of overt fluid overload on exam  For this patient, I am not able to [facility] her out, given her age, however upfront triage had ordered a D-dimer which is negative which makes PE much less likely and overall I am not clinically concerned for PE given no tachypnea, no hypoxia no hypotension no previous history of DVT or PE, no history of cancer, no coughing up blood.  I am concern for ACS, she does have a history of PCI, and states that she has not been fully compliant with her Plavix medication.  ECG upfront, poor baseline, may be some scattered T waves, we repeated this EKG and there are some T wave inversions maybe in lead III or V3.  First troponin however to come back prior to me seeing the patient, 263, no major metabolic derangement other than a glucose of 173 no anion gap distress DKA, no leukocytosis no major anemia.  We will wait for the results of the chest x-ray, get a second  troponin, get a BMP screening for heart failure, I will look at her last ejection fraction back in 2017 and look normal she does not have systolic heart failure that we know of at this point, will add on a urine as she may be vaguely endorses some burning with urination over the past week or so.  Make sure there is no other active infection going on although less likely no fever no leukocytosis.  I think this is primary cardiac, Jonny Ruiz that she has an aortic dissection.  Will order aspirin 324 mg.  No signs of dissection with no arm or leg pain, no focal neurodeficit, no abdominal pain or back pain or head pain.  I also considered diagnosis of GERD, given her story, however with the elevated tropes makes it much less likely.  Could be vasospasm  Still waiting on second troponin, however we Dr. Cardiology, will be starting the patient on heparin she has already received aspirin.  The patient will be admitted to the cardiology service at this time we are waiting on urine, questionable symptoms over the past week or 2 as well.  Cardiology will follow-up on second troponin and urine, and they have seen the patient now at bedside.  On heparin infusion, pain still gone.    1. ACS (acute coronary syndrome) (HCC)     @DISPOSITION @  Rx / DC Orders ED Discharge Orders  None        Past Medical History:  Diagnosis Date   Acid reflux    Anxiety    Asthma    CAD (coronary artery disease)    Stent to the circumflex 2017.   Degenerative disc disease    Depression    Diabetes mellitus    Hypertension    Hypoglycemia 12/30/2016   PVD (peripheral vascular disease) (HCC)    Stroke (HCC)    04/22/12   Past Surgical History:  Procedure Laterality Date   CARDIAC CATHETERIZATION N/A 11/21/2015   Procedure: Left Heart Cath and Coronary Angiography;  Surgeon: Peter M Swaziland, MD;  Location: [facility] INVASIVE CV LAB;  Service: Cardiovascular;  Laterality: N/A;   CARDIAC CATHETERIZATION N/A 11/21/2015   Procedure:  Coronary Stent Intervention;  Surgeon: Peter M Swaziland, MD;  Location: [facility] INVASIVE CV LAB;  Service: Cardiovascular;  Laterality: N/A;   KNEE SURGERY     TUBAL LIGATION     Family History  Problem Relation Age of Onset   Hypertension Other    Social History   Socioeconomic History   Marital status: Divorced    Spouse name: Not on file   Number of children: Not on file   Years of education: Not on file   Highest education level: Not on file  Occupational History   Not on file  Tobacco Use   Smoking status: Every Day    Current packs/day: 0.25    Average packs/day: 0.3 packs/day for 45.0 years (11.3 ttl pk-yrs)    Types: Cigarettes   Smokeless tobacco: Never  Vaping Use   Vaping status: Never Used  Substance and Sexual Activity   Alcohol use: No   Drug use: No   Sexual activity: Never  Other Topics Concern   Not on file  Social History Narrative   Not on file   Social Drivers of Health   Financial Resource Strain: Not on file  Food Insecurity: Not on file  Transportation Needs: Not on file  Physical Activity: Not on file  Stress: Not on file  Social Connections: Not on file  Intimate Partner Violence: Not on file     Physical Exam   Vitals:   04/10/23 1447 04/10/23 1700 04/10/23 1730 04/10/23 1733  BP: (!) 190/98 (!) 163/129 (!) 180/94   Pulse: 81 82 73   Resp: 18 (!) 21 20   Temp: 98.7 F (37.1 C)     TempSrc: Oral     SpO2: 97% 95% 97%   Weight:    100 kg  Height:    5\' 10"  (1.778 m)    Physical Exam Vitals and nursing note reviewed.  Constitutional:      General: She is not in acute distress.    Appearance: She is well-developed.  HENT:     Head: Normocephalic and atraumatic.     Right Ear: External ear normal.     Left Ear: External ear normal.     Nose: No congestion or rhinorrhea.     Mouth/Throat:     Mouth: Mucous membranes are moist.  Eyes:     Conjunctiva/sclera: Conjunctivae normal.  Neck:     Comments: No overt JVD Cardiovascular:      Rate and Rhythm: Normal rate and regular rhythm.     Heart sounds: No murmur heard. Pulmonary:     Effort: Pulmonary effort is normal. No respiratory distress.     Breath sounds: Normal breath sounds.     Comments: I do not  hear any crackles Abdominal:     Palpations: Abdomen is soft.     Tenderness: There is no abdominal tenderness.  Musculoskeletal:        General: Swelling present.     Cervical back: Neck supple.     Comments: There is some trace pedal edema bilaterally  Skin:    General: Skin is warm and dry.     Capillary Refill: Capillary refill takes less than 2 seconds.  Neurological:     General: No focal deficit present.     Mental Status: She is alert and oriented to person, place, and time.  Psychiatric:        Mood and Affect: Mood normal.      Procedures   If procedures were preformed on this patient, they are listed below:  Procedures  The patient was seen, evaluated, and treated in conjunction with the attending physician, who voiced agreement in the care provided.  Note generated using Dragon voice dictation software and may contain dictation errors. Please contact me for any clarification or with any questions.   Electronically signed by:  Osvaldo Shipper, M.D. (PGY-2)    Gunnar Bulla, MD 04/10/23 Silva Bandy    Pricilla Loveless, MD 04/11/23 1520

## 2023-04-10 NOTE — ED Provider Triage Note (Signed)
Emergency Medicine Provider Triage Evaluation Note  Miranda [name] , a 67 y.o. female  was evaluated in triage.  Pt complains of chest pain and SOB.  Review of Systems  Positive:  Negative:   Physical Exam  BP (!) 190/98 (BP Location: Right Arm) Comment: RN notified  Pulse 81   Temp 98.7 F (37.1 C) (Oral)   Resp 18   SpO2 97%  Gen:   Awake, no distress   Resp:  Normal effort  MSK:   Moves extremities without difficulty  Other:    Medical Decision Making  Medically screening exam initiated at 2:52 PM.  Appropriate orders placed.  Miranda [name] was informed that the remainder of the evaluation will be completed by another provider, this initial triage assessment does not replace that evaluation, and the importance of remaining in the ED until their evaluation is complete.  Concerned for chest pain and SOB x1 week. Also with LE swelling and pain from knees downwards in BL legs x1 week.  Denies fever, cough, nausea, vomiting, diarrhea. Denies recent surgery/immobilization, hx DVT/PE, hemoptysis, hx cancer in the past 6 months.     Miranda [name], [place] 04/10/23 1454

## 2023-04-10 NOTE — ED Triage Notes (Signed)
Pt states that she's been having midsternal CP non-radiating with SOB for the last week.

## 2023-04-10 NOTE — Telephone Encounter (Signed)
MRI order entered.

## 2023-04-10 NOTE — ED Notes (Signed)
Charge nurse notified of critical troponins and need for room

## 2023-04-10 NOTE — Progress Notes (Signed)
ANTICOAGULATION CONSULT NOTE - Initial Consult  Pharmacy Consult for Heparin Indication: chest pain/ACS  No Known Allergies  Patient Measurements: Height: 5\' 10"  (177.8 cm) Weight: 100 kg (220 lb 7.4 oz) IBW/kg (Calculated) : 68.5 Heparin Dosing Weight: 89.9 kg  Vital Signs: Temp: 98.7 F (37.1 C) (12/27 1447) Temp Source: Oral (12/27 1447) BP: 190/98 (12/27 1447) Pulse Rate: 81 (12/27 1447)  Labs: Recent Labs    04/10/23 1511  HGB 14.8  HCT 48.1*  PLT 303  CREATININE 0.82  TROPONINIHS 263*    Estimated Creatinine Clearance: 86.4 mL/min (by C-G formula based on SCr of 0.82 mg/dL).   Medical History: Past Medical History:  Diagnosis Date   Acid reflux    Anxiety    Asthma    Degenerative disc disease    Depression    Diabetes mellitus    Hypertension    Hypoglycemia 12/30/2016   Stroke (HCC)    04/22/12    Medications:  (Not in a hospital admission)  Scheduled:  Infusions:  PRN:   Assessment: 71 yof with a history of CAD. Patient is presenting with chest pain. Heparin per pharmacy consult placed for chest pain/ACS.  Patient is not on anticoagulation prior to arrival.  Hgb 14.8; plt 303 hsTrop 263 D-Dimer 0.34  Goal of Therapy:  Heparin level 0.3-0.7 units/ml Monitor platelets by anticoagulation protocol: Yes   Plan:  Give IV heparin 4000 units bolus x 1 Start heparin infusion at 1200 units/hr Check anti-Xa level in 6 hours and daily while on heparin Continue to monitor H&H and platelets  Delmar Landau, PharmD, BCPS 04/10/2023 5:35 PM ED Clinical Pharmacist -  [phone]

## 2023-04-10 NOTE — H&P (Signed)
CARDIOLOGY ADMISSION NOTE  Patient ID: Miranda [name] MRN: [id number] DOB/AGE: [birth year] 67 y.o.  Admit date: 04/10/2023 Primary Physician   [name], MD Primary Cardiologist   Nanetta Batty, MD Chief Complaint    Chest pain.   HPI:   The patient has a history of coronary artery disease.  She cardiac catheterization in 2017 with a 90% proximal AV groove circumflex stenosis and a 60% distal LAD lesion.  She was managed with DES to the circumflex.  EF was normal.  Her last evaluation was a negative perfusion study in May 2023.  He has a history of diabetes, hypertension and ongoing tobacco use.  She also has had peripheral vascular disease with occlusion of the mid right SFA and high-grade stenosis of the mid left SFA.  She was not thought to be a good revascularization candidate because of her ongoing tobacco.  She reports that she has been having some chest pain for about a week.  She is not sure that this is similar to her previous angina.  She says it is a burning discomfort and seems to happen more at night.  It happens when she is lying down.  She is taken some over-the-counter medications for reflux which seemed to help.  She describes it as a burning discomfort.  There is some discomfort in her throat.  She might get a little short of breath with this.  She is not describing nausea vomiting or diaphoresis.  She is not very active although she does go up stairs.  This does not bring it on.  Is not with movement or lying flat or position.  In the emergency room she was found to have an elevated troponin.  EKG demonstrated no acute changes.  Of note she has not consistently taken her blood pressure medicines recently.  She might be a little depressed.  She is also had some frequent urination.   Past Medical History:  Diagnosis Date   Acid reflux    Anxiety    Asthma    Degenerative disc disease    Depression    Diabetes mellitus    Hypertension    Hypoglycemia 12/30/2016    Stroke (HCC)    04/22/12    Past Surgical History:  Procedure Laterality Date   CARDIAC CATHETERIZATION N/A 11/21/2015   Procedure: Left Heart Cath and Coronary Angiography;  Surgeon: Peter M Swaziland, MD;  Location: [facility] INVASIVE CV LAB;  Service: Cardiovascular;  Laterality: N/A;   CARDIAC CATHETERIZATION N/A 11/21/2015   Procedure: Coronary Stent Intervention;  Surgeon: Peter M Swaziland, MD;  Location: [facility] INVASIVE CV LAB;  Service: Cardiovascular;  Laterality: N/A;   KNEE SURGERY     TUBAL LIGATION      No Known Allergies No current facility-administered medications on file prior to encounter.   Current Outpatient Medications on File Prior to Encounter  Medication Sig Dispense Refill   ACCU-CHEK AVIVA PLUS test strip 1 each as needed.  5   albuterol (PROVENTIL HFA;VENTOLIN HFA) 108 (90 Base) MCG/ACT inhaler Inhale 2 puffs into the lungs every 6 (six) hours as needed for wheezing or shortness of breath.     amLODipine (NORVASC) 5 MG tablet Take 5 mg by mouth daily.     atorvastatin (LIPITOR) 80 MG tablet TAKE 1 TABLET EVERY DAY AT 6 PM 15 tablet 0   B-D ULTRAFINE III SHORT PEN 31G X 8 MM MISC 1 each as needed.  11   buPROPion (WELLBUTRIN SR) 150 MG 12 hr  tablet Take 150 mg by mouth daily.     carvedilol (COREG) 12.5 MG tablet TAKE 1 TABLET (12.5MG  TOTAL) BY MOUTH TWICE A DAY WITH MEALS 180 tablet 3   cilostazol (PLETAL) 50 MG tablet TAKE 1 TABLET BY MOUTH TWICE A DAY 180 tablet 2   cloNIDine (CATAPRES) 0.1 MG tablet Take 0.1 mg by mouth daily as needed (sleep).      clopidogrel (PLAVIX) 75 MG tablet Take 1 tablet (75 mg total) by mouth daily. 60 tablet 2   Cyanocobalamin (B-12) 1000 MCG CAPS Take 1,000 mcg by mouth daily.      cyclobenzaprine (FLEXERIL) 10 MG tablet Take 10 mg by mouth 3 (three) times daily.     diazepam (VALIUM) 5 MG tablet Take one tablet by mouth with food one hour prior to procedure. May repeat 30 minutes prior if needed. 2 tablet 0   diclofenac sodium (VOLTAREN) 1 % GEL  Apply 4 g topically 4 (four) times daily. 1 Tube 0   diclofenac Sodium (VOLTAREN) 1 % GEL Apply 1 application topically 4 (four) times daily as needed for pain.     fluconazole (DIFLUCAN) 150 MG tablet SMARTSIG:1 Tablet(s) By Mouth Once a Week PRN     fluticasone (FLONASE) 50 MCG/ACT nasal spray Place 2 sprays into both nostrils as needed.     furosemide (LASIX) 20 MG tablet Take 1 tablet (20 mg total) by mouth daily. 3 tablet 0   gabapentin (NEURONTIN) 300 MG capsule Take 300 mg by mouth 3 (three) times daily.      Glycerin, Adult, 2.1 g SUPP Place 1 suppository rectally daily as needed for moderate constipation. 20 suppository 0   JARDIANCE 10 MG TABS tablet Take 10 mg by mouth daily.     ketoconazole (NIZORAL) 2 % cream APPLY 1 APPLICATION TOPICALLY DAILY 60 g 0   LANTUS SOLOSTAR 100 UNIT/ML Solostar Pen Inject 50 Units into the skin in the morning and at bedtime.     metFORMIN (GLUCOPHAGE) 1000 MG tablet Take 1,000 mg by mouth 2 (two) times daily.     metFORMIN (GLUMETZA) 1000 MG (MOD) 24 hr tablet Take 1 tablet (1,000 mg total) by mouth 2 (two) times daily with a meal. Restart tomorrow (Patient not taking: Reported on 08/01/2022)     metroNIDAZOLE (FLAGYL) 500 MG tablet Take 1 tablet (500 mg total) by mouth 2 (two) times daily. 14 tablet 0   MOVANTIK 25 MG TABS tablet Take 25 mg by mouth daily before breakfast.   5   nystatin cream (MYCOSTATIN) Apply 1 Application topically as needed.     ondansetron (ZOFRAN ODT) 4 MG disintegrating tablet Take 1 tablet (4 mg total) by mouth every 6 (six) hours as needed. 20 tablet 0   oxyCODONE-acetaminophen (PERCOCET) 7.5-325 MG tablet Take 1 tablet by mouth every 4 (four) hours as needed for severe pain.     polyethylene glycol (MIRALAX / GLYCOLAX) packet Take 17 g by mouth daily as needed. 14 each 0   potassium chloride (KLOR-CON) 10 MEQ tablet Take 1 tablet (10 mEq total) by mouth daily. 3 tablet 0   traZODone (DESYREL) 50 MG tablet Take 50 mg by mouth at  bedtime as needed for sleep.      Vitamin D, Ergocalciferol, (DRISDOL) 50000 units CAPS capsule Take 50,000 Units by mouth every 7 (seven) days. monday     Social History   Socioeconomic History   Marital status: Divorced    Spouse name: Not on file   Number of  children: Not on file   Years of education: Not on file   Highest education level: Not on file  Occupational History   Not on file  Tobacco Use   Smoking status: Every Day    Current packs/day: 0.25    Average packs/day: 0.3 packs/day for 45.0 years (11.3 ttl pk-yrs)    Types: Cigarettes   Smokeless tobacco: Never  Vaping Use   Vaping status: Never Used  Substance and Sexual Activity   Alcohol use: No   Drug use: No   Sexual activity: Never  Other Topics Concern   Not on file  Social History Narrative   Not on file   Social Drivers of Health   Financial Resource Strain: Not on file  Food Insecurity: Not on file  Transportation Needs: Not on file  Physical Activity: Not on file  Stress: Not on file  Social Connections: Not on file  Intimate Partner Violence: Not on file    Family History  Problem Relation Age of Onset   Hypertension Other      ROS: Degenerative joint disease.  Frequent urination.  Otherwise as stated in the HPI negative for all other systems.  Physical Exam: Blood pressure (!) 190/98, pulse 81, temperature 98.7 F (37.1 C), temperature source Oral, resp. rate 18, SpO2 97%.  GENERAL:  Well appearing HEENT:  Pupils equal round and reactive, fundi not visualized, oral mucosa unremarkable NECK:  No jugular venous distention, waveform within normal limits, carotid upstroke brisk and symmetric, no bruits, no thyromegaly LYMPHATICS:  No cervical, inguinal adenopathy LUNGS:  Clear to auscultation bilaterally BACK:  No CVA tenderness CHEST:  Unremarkable HEART:  PMI not displaced or sustained,S1 and S2 within normal limits, no S3, no S4, no clicks, no rubs, no murmurs ABD:  Flat, positive bowel  sounds normal in frequency in pitch, no bruits, no rebound, no guarding, no midline pulsatile mass, no hepatomegaly, no splenomegaly EXT:  2 plus pulses throughout, no edema, no cyanosis no clubbing SKIN:  No rashes no nodules NEURO:  Cranial nerves II through XII grossly intact, motor grossly intact throughout PSYCH:  Cognitively intact, oriented to person place and time   Labs: Lab Results  Component Value Date   BUN 12 04/10/2023   Lab Results  Component Value Date   CREATININE 0.82 04/10/2023   Lab Results  Component Value Date   NA 141 04/10/2023   K 3.8 04/10/2023   CL 106 04/10/2023   CO2 24 04/10/2023   Lab Results  Component Value Date   TROPONINI <0.03 12/30/2016   Lab Results  Component Value Date   WBC 6.8 04/10/2023   HGB 14.8 04/10/2023   HCT 48.1 (H) 04/10/2023   MCV 89.6 04/10/2023   PLT 303 04/10/2023   Lab Results  Component Value Date   CHOL 149 05/23/2022   HDL 41 (L) 05/23/2022   LDLCALC 91 05/23/2022   TRIG 79 05/23/2022   CHOLHDL 3.6 05/23/2022   Lab Results  Component Value Date   ALT 9 05/23/2022   AST 9 (L) 05/23/2022   ALKPHOS 165 (H) 03/08/2019   BILITOT 0.4 05/23/2022      Radiology:  CXR: No acute pulmonary disease  EKG:  Normal sinus rhythm, rate of 71, axis within normal limits, intervals within normal limits, poor anterior R wave progression, inferolateral T wave inversions nonspecific although this was not evident on previous EKG  ASSESSMENT AND PLAN:    Chest pain: Her chest discomfort sounds somewhat not anginal but  she has significant and ongoing risk factors.  She has some marginal changes on her EKG.  She has elevated troponin.  This needs to be considered as unstable angina.  She will be admitted with aspirin and Heparin.  Cardiac cath is indicated.  The patient understands that risks included but are not limited to stroke (1 in 1000), death (1 in 1000), kidney failure [usually temporary] (1 in 500), bleeding (1 in  200), allergic reaction [possibly serious] (1 in 200).  The patient understands and agrees to proceed.   PVD: She is continued risk reduction and to stop smoking.  Hypertension: We need to restart her blood pressure medications.  She says it usually well-controlled at home when she is taking these.  Ongoing tobacco abuse: We will continue education.  Diabetes mellitus: Check hemoglobin A1c.  She reports her last 1 was 8.0.  Continue the other meds as listed holding meds as appropriate for CAD.  Of note she says her [place] Pepper is 25 mg and is listed as 10.  Dyslipidemia: Continue her statin and check a lipid profile and LP(a).  Frequent urination: Check a UA.  We also talked about seeing a urologist if this continues.   SignedRollene Rotunda 04/10/2023, 5:31 PM

## 2023-04-10 NOTE — Progress Notes (Signed)
Phlebotomy to stick for labs.

## 2023-04-11 ENCOUNTER — Inpatient Hospital Stay (HOSPITAL_COMMUNITY): Payer: 59

## 2023-04-11 DIAGNOSIS — I214 Non-ST elevation (NSTEMI) myocardial infarction: Secondary | ICD-10-CM

## 2023-04-11 DIAGNOSIS — R079 Chest pain, unspecified: Secondary | ICD-10-CM | POA: Diagnosis not present

## 2023-04-11 LAB — BASIC METABOLIC PANEL
Anion gap: 10 (ref 5–15)
BUN: 15 mg/dL (ref 8–23)
CO2: 24 mmol/L (ref 22–32)
Calcium: 8.9 mg/dL (ref 8.9–10.3)
Chloride: 104 mmol/L (ref 98–111)
Creatinine, Ser: 0.96 mg/dL (ref 0.44–1.00)
GFR, Estimated: >60 mL/min (ref >60)
Glucose, Bld: 305 mg/dL — ABNORMAL HIGH (ref 70–99)
Potassium: 3.6 mmol/L (ref 3.5–5.1)
Sodium: 138 mmol/L (ref 135–145)

## 2023-04-11 LAB — ECHOCARDIOGRAM COMPLETE
Area-P 1/2: 2.6 cm2
Est EF: >75
Height: 70 in
S' Lateral: 2.6 cm
Weight: 3668.45 [oz_av]

## 2023-04-11 LAB — HEPARIN LEVEL (UNFRACTIONATED)
Heparin Unfractionated: 0.19 [IU]/mL — ABNORMAL LOW (ref 0.30–0.70)
Heparin Unfractionated: 0.27 [IU]/mL — ABNORMAL LOW (ref 0.30–0.70)
Heparin Unfractionated: 0.32 [IU]/mL (ref 0.30–0.70)

## 2023-04-11 LAB — LIPID PANEL
Cholesterol: 159 mg/dL (ref 0–200)
HDL: 35 mg/dL — ABNORMAL LOW (ref >40)
LDL Cholesterol: 75 mg/dL (ref 0–99)
Total CHOL/HDL Ratio: 4.5 {ratio}
Triglycerides: 246 mg/dL — ABNORMAL HIGH (ref <150)
VLDL: 49 mg/dL — ABNORMAL HIGH (ref 0–40)

## 2023-04-11 LAB — TROPONIN I (HIGH SENSITIVITY): Troponin I (High Sensitivity): 231 ng/L (ref <18)

## 2023-04-11 MED ORDER — HYDRALAZINE HCL 10 MG PO TABS: 20.0000 mg | ORAL_TABLET | Freq: Four times a day (QID) | ORAL | Status: DC | PRN

## 2023-04-11 MED ORDER — AMLODIPINE BESYLATE 10 MG PO TABS
10.0000 mg | ORAL_TABLET | Freq: Every day | ORAL | Status: DC
  Administered 2023-04-12 – 2023-04-14 (×2): 10 mg via ORAL
  Filled 2023-04-11 (×2): qty 1

## 2023-04-11 MED ORDER — HEPARIN BOLUS VIA INFUSION
2000.0000 [IU] | Freq: Once | INTRAVENOUS | Status: AC
  Administered 2023-04-11: 2000 [IU] via INTRAVENOUS
  Filled 2023-04-11: qty 2000

## 2023-04-11 NOTE — Progress Notes (Signed)
ANTICOAGULATION CONSULT NOTE   Pharmacy Consult for Heparin Indication: chest pain/ACS  No Known Allergies  Patient Measurements: Height: 5\' 10"  (177.8 cm) Weight: 104 kg (229 lb 4.5 oz) IBW/kg (Calculated) : 68.5 Heparin Dosing Weight: 90 kg  Vital Signs: Temp: 98.1 F (36.7 C) (12/28 1458) Temp Source: Oral (12/28 1458) BP: 163/99 (12/28 1445) Pulse Rate: 70 (12/28 1516)  Labs: Recent Labs    04/10/23 1511 04/10/23 1743 04/10/23 2310 04/11/23 0015 04/11/23 0310 04/11/23 1047 04/11/23 1646  HGB 14.8  --   --   --   --   --   --   HCT 48.1*  --   --   --   --   --   --   PLT 303  --   --   --   --   --   --   HEPARINUNFRC  --   --   --  0.19*  --  0.27* 0.32  CREATININE 0.82  --   --   --  0.96  --   --   TROPONINIHS 263* 249* 231*  --   --   --   --     Estimated Creatinine Clearance: 75.3 mL/min (by C-G formula based on SCr of 0.96 mg/dL).   Medical History: Past Medical History:  Diagnosis Date   Acid reflux    Anxiety    Asthma    CAD (coronary artery disease)    Stent to the circumflex 2017.   Degenerative disc disease    Depression    Diabetes mellitus    Hypertension    Hypoglycemia 12/30/2016   PVD (peripheral vascular disease) (HCC)    Stroke (HCC)    04/22/12    Medications:  Medications Prior to Admission  Medication Sig Dispense Refill Last Dose/Taking   amLODipine (NORVASC) 5 MG tablet Take 5 mg by mouth daily.   Past Week   atorvastatin (LIPITOR) 80 MG tablet TAKE 1 TABLET EVERY DAY AT 6 PM (Patient taking differently: daily.) 15 tablet 0 Past Week   buPROPion (WELLBUTRIN XL) 300 MG 24 hr tablet Take 300 mg by mouth daily.   Past Week   carvedilol (COREG) 12.5 MG tablet TAKE 1 TABLET (12.5MG  TOTAL) BY MOUTH TWICE A DAY WITH MEALS 180 tablet 3 Past Week   cilostazol (PLETAL) 50 MG tablet TAKE 1 TABLET BY MOUTH TWICE A DAY 180 tablet 2 Past Week   cloNIDine (CATAPRES) 0.1 MG tablet Take 0.1 mg by mouth daily as needed (Blood pressure).    Past Week   clopidogrel (PLAVIX) 75 MG tablet Take 1 tablet (75 mg total) by mouth daily. 60 tablet 2 Past Week   Cyanocobalamin (B-12) 1000 MCG CAPS Take 1,000 mcg by mouth daily.    Past Week   cyclobenzaprine (FLEXERIL) 10 MG tablet Take 10 mg by mouth 3 (three) times daily as needed.   Past Week   diclofenac sodium (VOLTAREN) 1 % GEL Apply 4 g topically 4 (four) times daily. 1 Tube 0 Past Week   fluticasone (FLONASE) 50 MCG/ACT nasal spray Place 2 sprays into both nostrils as needed.   Past Month   furosemide (LASIX) 20 MG tablet Take 1 tablet (20 mg total) by mouth daily. 3 tablet 0 Past Week   gabapentin (NEURONTIN) 300 MG capsule Take 300 mg by mouth 2 (two) times daily.   Past Week   JARDIANCE 10 MG TABS tablet Take 10 mg by mouth daily.   Past Week   LANTUS  SOLOSTAR 100 UNIT/ML Solostar Pen Inject 50 Units into the skin in the morning and at bedtime.   Past Week   LINZESS 145 MCG CAPS capsule Take 145 mcg by mouth daily.   Past Week   metFORMIN (GLUCOPHAGE) 1000 MG tablet Take 1,000 mg by mouth 2 (two) times daily.   Past Week   MOVANTIK 25 MG TABS tablet Take 25 mg by mouth daily before breakfast.   5 Past Month   naloxone (NARCAN) nasal spray 4 mg/0.1 mL Place 0.4 mg into the nose once.   Taking   oxyCODONE-acetaminophen (PERCOCET) 10-325 MG tablet Take 1 tablet by mouth 4 (four) times daily as needed.   04/10/2023 Morning   polyethylene glycol (MIRALAX / GLYCOLAX) packet Take 17 g by mouth daily as needed. 14 each 0 Past Week   potassium chloride (KLOR-CON) 10 MEQ tablet Take 1 tablet (10 mEq total) by mouth daily. (Patient taking differently: Take 10 mEq by mouth as needed.) 3 tablet 0 Past Week   traZODone (DESYREL) 50 MG tablet Take 50 mg by mouth at bedtime as needed for sleep.    Past Week   albuterol (PROVENTIL HFA;VENTOLIN HFA) 108 (90 Base) MCG/ACT inhaler Inhale 2 puffs into the lungs every 6 (six) hours as needed for wheezing or shortness of breath. (Patient not taking:  Reported on 04/11/2023)   Not Taking   pregabalin (LYRICA) 50 MG capsule Take 50 mg by mouth 3 (three) times daily. (Patient not taking: Reported on 04/11/2023)   Not Taking   Scheduled:   [START ON 04/12/2023] amLODipine  10 mg Oral Daily   aspirin EC  81 mg Oral Daily   atorvastatin  80 mg Oral Daily   carvedilol  12.5 mg Oral BID WC   clopidogrel  75 mg Oral Daily   cyclobenzaprine  10 mg Oral TID   gabapentin  300 mg Oral TID   Infusions:   heparin 1,450 Units/hr (04/11/23 1138)   PRN: acetaminophen, hydrALAZINE, nitroGLYCERIN, ondansetron (ZOFRAN) IV, traZODone  Assessment: 66 yof with a history of CAD. Patient is presenting with chest pain. Cardiology planning for LHC early this week. Heparin per pharmacy consult placed for chest pain/ACS. Patient is not on anticoagulation prior to arrival.  Last CBC: Hgb 14.8; plt 303 PM update: HL 0.32- therapeutic on heparin 1450 units/hr. No bleeding reported.  Goal of Therapy:  Heparin level 0.3-0.7 units/ml Monitor platelets by anticoagulation protocol: Yes   Plan:  Continue heparin infusion 1450 units/hr  Follow up daily anti-Xa level while on heparin Continue to monitor H&H and platelets   Thank you for allowing pharmacy to be a part of this patient's care.   Signe Colt, PharmD 04/11/2023 5:46 PM  **Pharmacist phone directory can be found on [email or web address] listed under [facility]**

## 2023-04-11 NOTE — ED Notes (Signed)
ED TO INPATIENT HANDOFF REPORT  ED Nurse Name and Phone #: Angelica Chessman, [phone]  S Name/Age/Gender Miranda [name] 67 y.o. female Room/Bed: 045C/045C  Code Status   Code Status: Full Code  Home/SNF/Other Home Patient oriented to: self, place, time, and situation Is this baseline? Yes   Triage Complete: Triage complete  Chief Complaint NSTEMI (non-ST elevated myocardial infarction) [facility]) [I21.4]  Triage Note Pt states that she's been having midsternal CP non-radiating with SOB for the last week.    Allergies No Known Allergies  Level of Care/Admitting Diagnosis ED Disposition     ED Disposition  Admit   Condition  --   Comment  Hospital Area: [facility] [[id number]]  Level of Care: Telemetry Cardiac [103]  May admit patient to [facility] or [facility] if equivalent level of care is available:: No  Covid Evaluation: Asymptomatic - no recent exposure (last 10 days) testing not required  Diagnosis: NSTEMI (non-ST elevated myocardial infarction) [facility]) [[id number]]  Admitting Physician: Rollene Rotunda [phone]  Attending Physician: Rollene Rotunda [phone]  Certification:: I certify this patient will need inpatient services for at least 2 midnights  Expected Medical Readiness: 04/14/2023          B Medical/Surgery History Past Medical History:  Diagnosis Date   Acid reflux    Anxiety    Asthma    CAD (coronary artery disease)    Stent to the circumflex 2017.   Degenerative disc disease    Depression    Diabetes mellitus    Hypertension    Hypoglycemia 12/30/2016   PVD (peripheral vascular disease) (HCC)    Stroke (HCC)    04/22/12   Past Surgical History:  Procedure Laterality Date   CARDIAC CATHETERIZATION N/A 11/21/2015   Procedure: Left Heart Cath and Coronary Angiography;  Surgeon: Peter M Swaziland, MD;  Location: [facility] INVASIVE CV LAB;  Service: Cardiovascular;  Laterality: N/A;   CARDIAC CATHETERIZATION N/A 11/21/2015   Procedure: Coronary Stent  Intervention;  Surgeon: Peter M Swaziland, MD;  Location: [facility] INVASIVE CV LAB;  Service: Cardiovascular;  Laterality: N/A;   KNEE SURGERY     TUBAL LIGATION       A IV Location/Drains/Wounds Patient Lines/Drains/Airways Status     Active Line/Drains/Airways     Name Placement date Placement time Site Days   Peripheral IV 04/10/23 20 G Right Antecubital 04/10/23  1742  Antecubital  1   Peripheral IV 04/10/23 20 G 1" Left Antecubital 04/10/23  2308  Antecubital  1            Intake/Output Last 24 hours No intake or output data in the 24 hours ending 04/11/23 1347  Labs/Imaging Results for orders placed or performed during the hospital encounter of 04/10/23 (from the past 48 hours)  Basic metabolic panel     Status: Abnormal   Collection Time: 04/10/23  3:11 PM  Result Value Ref Range   Sodium 141 135 - 145 mmol/L   Potassium 3.8 3.5 - 5.1 mmol/L   Chloride 106 98 - 111 mmol/L   CO2 24 22 - 32 mmol/L   Glucose, Bld 173 (H) 70 - 99 mg/dL    Comment: Glucose reference range applies only to samples taken after fasting for at least 8 hours.   BUN 12 8 - 23 mg/dL   Creatinine, Ser 7.82 0.44 - 1.00 mg/dL   Calcium 9.2 8.9 - 95.6 mg/dL   GFR, Estimated >21 >30 mL/min    Comment: (NOTE) Calculated using the  CKD-EPI Creatinine Equation (2021)    Anion gap 11 5 - 15    Comment: Performed at [facility], [address] [place], [place] [address]  CBC     Status: Abnormal   Collection Time: 04/10/23  3:11 PM  Result Value Ref Range   WBC 6.8 4.0 - 10.5 K/uL   RBC 5.37 (H) 3.87 - 5.11 MIL/uL   Hemoglobin 14.8 12.0 - 15.0 g/dL   HCT 60.4 (H) 54.0 - 98.1 %   MCV 89.6 80.0 - 100.0 fL   MCH 27.6 26.0 - 34.0 pg   MCHC 30.8 30.0 - 36.0 g/dL   RDW 19.1 47.8 - 29.5 %   Platelets 303 150 - 400 K/uL   nRBC 0.0 0.0 - 0.2 %    Comment: Performed at [facility], [address], [place], [place] [address]  Troponin I (High Sensitivity)     Status: Abnormal   Collection Time:  04/10/23  3:11 PM  Result Value Ref Range   Troponin I (High Sensitivity) 263 (HH) <18 ng/L    Comment: CRITICAL RESULT CALLED TO, READ BACK BY AND VERIFIED WITH BILLY MONTE RN @1606  ON 04/10/23 BY MAB (NOTE) Elevated high sensitivity troponin I (hsTnI) values and significant  changes across serial measurements may suggest ACS but many other  chronic and acute conditions are known to elevate hsTnI results.  Refer to the "Links" section for chest pain algorithms and additional  guidance. Performed at [facility], [address], [place], [place] [address]   D-dimer, quantitative     Status: None   Collection Time: 04/10/23  3:11 PM  Result Value Ref Range   D-Dimer, Quant 0.34 0.00 - 0.50 ug/mL-FEU    Comment: (NOTE) At the manufacturer cut-off value of 0.5 g/mL FEU, this assay has a negative predictive value of 95-100%.This assay is intended for use in conjunction with a clinical pretest probability (PTP) assessment model to exclude pulmonary embolism (PE) and deep venous thrombosis (DVT) in outpatients suspected of PE or DVT. Results should be correlated with clinical presentation. Performed at [facility], [address] [place], [place] [address]   Brain natriuretic peptide     Status: Abnormal   Collection Time: 04/10/23  3:24 PM  Result Value Ref Range   B Natriuretic Peptide 288.7 (H) 0.0 - 100.0 pg/mL    Comment: Performed at [facility], [address] [place], [place] [address]  Urinalysis, w/ Reflex to Culture (Infection Suspected) -Urine, Clean Catch     Status: Abnormal   Collection Time: 04/10/23  5:15 PM  Result Value Ref Range   Specimen Source URINE, CLEAN CATCH    Color, Urine YELLOW YELLOW   APPearance HAZY (A) CLEAR   Specific Gravity, Urine 1.035 (H) 1.005 - 1.030   pH 6.0 5.0 - 8.0   Glucose, UA >=500 (A) NEGATIVE mg/dL   Hgb urine dipstick NEGATIVE NEGATIVE   Bilirubin Urine NEGATIVE NEGATIVE   Ketones, ur NEGATIVE NEGATIVE  mg/dL   Protein, ur NEGATIVE NEGATIVE mg/dL   Nitrite NEGATIVE NEGATIVE   Leukocytes,Ua TRACE (A) NEGATIVE   RBC / HPF 0-5 0 - 5 RBC/hpf   WBC, UA 11-20 0 - 5 WBC/hpf    Comment:        Reflex urine culture not performed if WBC <=10, OR if Squamous epithelial cells >5. If Squamous epithelial cells >5 suggest recollection.    Bacteria, UA MANY (A) NONE SEEN  Squamous Epithelial / HPF 0-5 0 - 5 /HPF   Budding Yeast PRESENT     Comment: Performed at [facility], [address], [place], [place] [address]  Urine Culture     Status: None (Preliminary result)   Collection Time: 04/10/23  5:15 PM   Specimen: Urine, Clean Catch  Result Value Ref Range   Specimen Description URINE, CLEAN CATCH    Special Requests NONE Reflexed from [id number]    Culture      CULTURE REINCUBATED FOR BETTER GROWTH Performed at [facility], [address] [place], [place] [address]    Report Status PENDING   Troponin I (High Sensitivity)     Status: Abnormal   Collection Time: 04/10/23  5:43 PM  Result Value Ref Range   Troponin I (High Sensitivity) 249 (HH) <18 ng/L    Comment: CRITICAL VALUE NOTED. VALUE IS CONSISTENT WITH PREVIOUSLY REPORTED/CALLED VALUE (NOTE) Elevated high sensitivity troponin I (hsTnI) values and significant  changes across serial measurements may suggest ACS but many other  chronic and acute conditions are known to elevate hsTnI results.  Refer to the "Links" section for chest pain algorithms and additional  guidance. Performed at [facility], [address] [place], [place] [address]   Troponin I (High Sensitivity)     Status: Abnormal   Collection Time: 04/10/23 11:10 PM  Result Value Ref Range   Troponin I (High Sensitivity) 231 (HH) <18 ng/L    Comment: CRITICAL VALUE NOTED. VALUE IS CONSISTENT WITH PREVIOUSLY REPORTED/CALLED VALUE (NOTE) Elevated high sensitivity troponin I (hsTnI) values and significant  changes across serial measurements may  suggest ACS but many other  chronic and acute conditions are known to elevate hsTnI results.  Refer to the "Links" section for chest pain algorithms and additional  guidance. Performed at [facility], [address] [place], [place] [address]   Heparin level (unfractionated)     Status: Abnormal   Collection Time: 04/11/23 12:15 AM  Result Value Ref Range   Heparin Unfractionated 0.19 (L) 0.30 - 0.70 IU/mL    Comment: (NOTE) The clinical reportable range upper limit is being lowered to >1.10 to align with the FDA approved guidance for the current laboratory assay.  If heparin results are below expected values, and patient dosage has  been confirmed, suggest follow up testing of antithrombin III levels. Performed at [facility], [address], [place], [place] [address]   Basic metabolic panel     Status: Abnormal   Collection Time: 04/11/23  3:10 AM  Result Value Ref Range   Sodium 138 135 - 145 mmol/L   Potassium 3.6 3.5 - 5.1 mmol/L   Chloride 104 98 - 111 mmol/L   CO2 24 22 - 32 mmol/L   Glucose, Bld 305 (H) 70 - 99 mg/dL    Comment: Glucose reference range applies only to samples taken after fasting for at least 8 hours.   BUN 15 8 - 23 mg/dL   Creatinine, Ser 1.30 0.44 - 1.00 mg/dL   Calcium 8.9 8.9 - 86.5 mg/dL   GFR, Estimated >78 >46 mL/min    Comment: (NOTE) Calculated using the CKD-EPI Creatinine Equation (2021)    Anion gap 10 5 - 15    Comment: Performed at [facility], [address] [place], [place] [address]  Lipid panel     Status: Abnormal   Collection Time: 04/11/23  3:10 AM  Result Value Ref  Range   Cholesterol 159 0 - 200 mg/dL   Triglycerides 295 (H) <150 mg/dL   HDL 35 (L) >28 mg/dL   Total CHOL/HDL Ratio 4.5 RATIO   VLDL 49 (H) 0 - 40 mg/dL   LDL Cholesterol 75 0 - 99 mg/dL    Comment:        Total Cholesterol/HDL:CHD Risk Coronary Heart Disease Risk Table                     Men   Women  1/2 Average Risk   3.4   3.3   Average Risk       5.0   4.4  2 X Average Risk   9.6   7.1  3 X Average Risk  23.4   11.0        Use the calculated Patient Ratio above and the CHD Risk Table to determine the patient's CHD Risk.        ATP III CLASSIFICATION (LDL):  <100     mg/dL   Optimal  413-244  mg/dL   Near or Above                    Optimal  130-159  mg/dL   Borderline  010-272  mg/dL   High  >536     mg/dL   Very High Performed at [facility], [address], [place], [place] [address]   Heparin level (unfractionated)     Status: Abnormal   Collection Time: 04/11/23 10:47 AM  Result Value Ref Range   Heparin Unfractionated 0.27 (L) 0.30 - 0.70 IU/mL    Comment: (NOTE) The clinical reportable range upper limit is being lowered to >1.10 to align with the FDA approved guidance for the current laboratory assay.  If heparin results are below expected values, and patient dosage has  been confirmed, suggest follow up testing of antithrombin III levels. Performed at [facility], [address] [place], [place] [address]    DG Chest 2 View Result Date: 04/10/2023 CLINICAL DATA:  Chest pain.  Shortness of breath. EXAM: CHEST - 2 VIEW COMPARISON:  08/27/2022. FINDINGS: Low lung volume. Bilateral lung fields are clear. Bilateral costophrenic angles are clear. Stable cardio-mediastinal silhouette. No acute osseous abnormalities. The soft tissues are within normal limits. IMPRESSION: No active cardiopulmonary disease. Electronically Signed   By: Jules Schick M.D.   On: 04/10/2023 17:19    Pending Labs Unresulted Labs (From admission, onward)     Start     Ordered   04/11/23 1700  Heparin level (unfractionated)  Once-Timed,   TIMED        04/11/23 1122   04/11/23 0500  Lipoprotein A (LPA)  Tomorrow morning,   R        04/10/23 2148            Vitals/Pain Today's Vitals   04/11/23 0000 04/11/23 0400 04/11/23 0934 04/11/23 1314  BP: (!) 173/92 (!) 142/79 (!) 158/79 (!) 160/84  Pulse:  76 71 83 73  Resp: 20 17 17 18   Temp: 98 F (36.7 C) 98.1 F (36.7 C) 98.1 F (36.7 C)   TempSrc: Oral Oral Oral   SpO2: 93% 94% 98% 91%  Weight:      Height:      PainSc:  0-No pain      Isolation Precautions No active isolations  Medications Medications  heparin ADULT infusion 100 units/mL (25000 units/235mL) (1,450 Units/hr Intravenous Rate/Dose  Change 04/11/23 1138)  carvedilol (COREG) tablet 12.5 mg (12.5 mg Oral Given 04/11/23 0927)  atorvastatin (LIPITOR) tablet 80 mg (80 mg Oral Given 04/11/23 0927)  traZODone (DESYREL) tablet 50 mg (50 mg Oral Given 04/10/23 2257)  clopidogrel (PLAVIX) tablet 75 mg (75 mg Oral Given 04/11/23 0926)  gabapentin (NEURONTIN) capsule 300 mg (300 mg Oral Given 04/11/23 0927)  cyclobenzaprine (FLEXERIL) tablet 10 mg (10 mg Oral Given 04/11/23 0927)  aspirin EC tablet 81 mg (81 mg Oral Given 04/11/23 0927)  nitroGLYCERIN (NITROSTAT) SL tablet 0.4 mg (has no administration in time range)  acetaminophen (TYLENOL) tablet 650 mg (has no administration in time range)  ondansetron (ZOFRAN) injection 4 mg (has no administration in time range)  amLODipine (NORVASC) tablet 10 mg (has no administration in time range)  hydrALAZINE (APRESOLINE) tablet 20 mg (has no administration in time range)  aspirin chewable tablet 324 mg (324 mg Oral Given 04/10/23 1719)  heparin bolus via infusion 4,000 Units (4,000 Units Intravenous Bolus from Bag 04/10/23 1747)  heparin bolus via infusion 2,000 Units (2,000 Units Intravenous Bolus from Bag 04/11/23 0154)    Mobility walks with device     Focused Assessments Cardiac Assessment Handoff:    Lab Results  Component Value Date   TROPONINI <0.03 12/30/2016   Lab Results  Component Value Date   DDIMER 0.34 04/10/2023   Does the Patient currently have chest pain? No    R Recommendations: See Admitting Provider Note  Report given to:   Additional Notes: Pt is AO, walky/talky, has some stress  incontinence, able to feed herself, family has been at bedside

## 2023-04-11 NOTE — Progress Notes (Signed)
ANTICOAGULATION CONSULT NOTE   Pharmacy Consult for Heparin Indication: chest pain/ACS  No Known Allergies  Patient Measurements: Height: 5\' 10"  (177.8 cm) Weight: 100 kg (220 lb 7.4 oz) IBW/kg (Calculated) : 68.5 Heparin Dosing Weight: 89.9 kg  Vital Signs: Temp: 98.1 F (36.7 C) (12/28 0934) Temp Source: Oral (12/28 0934) BP: 158/79 (12/28 0934) Pulse Rate: 83 (12/28 0934)  Labs: Recent Labs    04/10/23 1511 04/10/23 1743 04/10/23 2310 04/11/23 0015 04/11/23 0310 04/11/23 1047  HGB 14.8  --   --   --   --   --   HCT 48.1*  --   --   --   --   --   PLT 303  --   --   --   --   --   HEPARINUNFRC  --   --   --  0.19*  --  0.27*  CREATININE 0.82  --   --   --  0.96  --   TROPONINIHS 263* 249* 231*  --   --   --     Estimated Creatinine Clearance: 73.8 mL/min (by C-G formula based on SCr of 0.96 mg/dL).   Medical History: Past Medical History:  Diagnosis Date   Acid reflux    Anxiety    Asthma    CAD (coronary artery disease)    Stent to the circumflex 2017.   Degenerative disc disease    Depression    Diabetes mellitus    Hypertension    Hypoglycemia 12/30/2016   PVD (peripheral vascular disease) (HCC)    Stroke (HCC)    04/22/12    Medications:  (Not in a hospital admission)  Scheduled:   [START ON 04/12/2023] amLODipine  10 mg Oral Daily   aspirin EC  81 mg Oral Daily   atorvastatin  80 mg Oral Daily   carvedilol  12.5 mg Oral BID WC   clopidogrel  75 mg Oral Daily   cyclobenzaprine  10 mg Oral TID   gabapentin  300 mg Oral TID   Infusions:   heparin 1,350 Units/hr (04/11/23 0154)   PRN: acetaminophen, hydrALAZINE, nitroGLYCERIN, ondansetron (ZOFRAN) IV, traZODone  Assessment: 87 yof with a history of CAD. Patient is presenting with chest pain. Cardiology planning for LHC early this week. Heparin per pharmacy consult placed for chest pain/ACS. Patient is not on anticoagulation prior to arrival.  Last CBC: Hgb 14.8; plt 303 HL 0.27 -  subtherapeutic   Goal of Therapy:  Heparin level 0.3-0.7 units/ml Monitor platelets by anticoagulation protocol: Yes   Plan:  Increase heparin infusion to 1450 units/hr  Check anti-Xa level in 6 hours and daily while on heparin Continue to monitor H&H and platelets   Calton Dach, PharmD, BCCCP Clinical Pharmacist 04/11/2023 11:20 AM

## 2023-04-11 NOTE — Progress Notes (Signed)
ANTICOAGULATION CONSULT NOTE   Pharmacy Consult for Heparin Indication: chest pain/ACS  No Known Allergies  Patient Measurements: Height: 5\' 10"  (177.8 cm) Weight: 100 kg (220 lb 7.4 oz) IBW/kg (Calculated) : 68.5 Heparin Dosing Weight: 89.9 kg  Vital Signs: Temp: 98 F (36.7 C) (12/28 0000) Temp Source: Oral (12/28 0000) BP: 173/92 (12/28 0000) Pulse Rate: 76 (12/28 0000)  Labs: Recent Labs    04/10/23 1511 04/10/23 1743 04/10/23 2310 04/11/23 0015  HGB 14.8  --   --   --   HCT 48.1*  --   --   --   PLT 303  --   --   --   HEPARINUNFRC  --   --   --  0.19*  CREATININE 0.82  --   --   --   TROPONINIHS 263* 249* 231*  --     Estimated Creatinine Clearance: 86.4 mL/min (by C-G formula based on SCr of 0.82 mg/dL).   Medical History: Past Medical History:  Diagnosis Date   Acid reflux    Anxiety    Asthma    CAD (coronary artery disease)    Stent to the circumflex 2017.   Degenerative disc disease    Depression    Diabetes mellitus    Hypertension    Hypoglycemia 12/30/2016   PVD (peripheral vascular disease) (HCC)    Stroke (HCC)    04/22/12    Medications:  (Not in a hospital admission)  Scheduled:   amLODipine  5 mg Oral Daily   aspirin EC  81 mg Oral Daily   atorvastatin  80 mg Oral Daily   carvedilol  12.5 mg Oral BID WC   clopidogrel  75 mg Oral Daily   cyclobenzaprine  10 mg Oral TID   gabapentin  300 mg Oral TID   heparin  2,000 Units Intravenous Once   Infusions:   heparin 1,200 Units/hr (04/10/23 1748)   PRN: acetaminophen, nitroGLYCERIN, ondansetron (ZOFRAN) IV, traZODone  Assessment: 76 yof with a history of CAD. Patient is presenting with chest pain. Heparin per pharmacy consult placed for chest pain/ACS.  Patient is not on anticoagulation prior to arrival.  Hgb 14.8; plt 303 hsTrop 263 D-Dimer 0.34  12/28 AM update:  Heparin level sub-therapeutic   Goal of Therapy:  Heparin level 0.3-0.7 units/ml Monitor platelets by  anticoagulation protocol: Yes   Plan:  Give IV heparin 2000 units bolus x 1 Inc heparin to 1350 units/hr Check anti-Xa level in 6-8 hours and daily while on heparin Continue to monitor H&H and platelets  Abran Duke, PharmD, BCPS Clinical Pharmacist Phone: [phone]

## 2023-04-11 NOTE — Progress Notes (Signed)
  Echocardiogram 2D Echocardiogram has been performed.  Delcie Roch 04/11/2023, 5:28 PM

## 2023-04-11 NOTE — Progress Notes (Signed)
   Rounding Note    Patient Name: Miranda [name] Date of Encounter: 04/11/2023   [facility] Cardiologist: Nanetta Batty, MD   Subjective   NAEO. Eating breakfast this AM.  Vital Signs    Vitals:   04/10/23 2039 04/11/23 0000 04/11/23 0400 04/11/23 0934  BP: (!) 161/85 (!) 173/92 (!) 142/79 (!) 158/79  Pulse: 75 76 71 83  Resp: 16 20 17 17   Temp: 98.3 F (36.8 C) 98 F (36.7 C) 98.1 F (36.7 C) 98.1 F (36.7 C)  TempSrc: Oral Oral Oral Oral  SpO2: 97% 93% 94% 98%  Weight:      Height:       No intake or output data in the 24 hours ending 04/11/23 0943    04/10/2023    5:33 PM 08/27/2022    2:34 PM 08/01/2022    3:48 PM  Last 3 Weights  Weight (lbs) 220 lb 7.4 oz 220 lb 228 lb 6.4 oz  Weight (kg) 100 kg 99.791 kg 103.602 kg      Telemetry    Personally Reviewed  ECG    Personally Reviewed  Physical Exam   GEN: No acute distress.   Cardiac: RRR, no murmurs, rubs, or gallops.  Respiratory: Clear to auscultation bilaterally. Psych: Normal affect   Assessment & Plan    #Unstable angina Chest pain has now resolved. Hemodynamically stable. - cont heparin - cont aspirin - cont statin Plan for LHC early this week.  #HTN Above goal right now. - cont home medications - add hydralazine prn     Sheria Lang T. Lalla Brothers, MD, [facility], [facility] Cardiac Electrophysiology

## 2023-04-11 NOTE — Plan of Care (Signed)

## 2023-04-12 DIAGNOSIS — I214 Non-ST elevation (NSTEMI) myocardial infarction: Secondary | ICD-10-CM | POA: Diagnosis not present

## 2023-04-12 LAB — URINE CULTURE

## 2023-04-12 LAB — CBC
HCT: 42.6 % (ref 36.0–46.0)
Hemoglobin: 12.9 g/dL (ref 12.0–15.0)
MCH: 27.5 pg (ref 26.0–34.0)
MCHC: 30.3 g/dL (ref 30.0–36.0)
MCV: 90.8 fL (ref 80.0–100.0)
Platelets: 227 10*3/uL (ref 150–400)
RBC: 4.69 MIL/uL (ref 3.87–5.11)
RDW: 14.2 % (ref 11.5–15.5)
WBC: 6.8 10*3/uL (ref 4.0–10.5)
nRBC: 0 % (ref 0.0–0.2)

## 2023-04-12 LAB — GLUCOSE, CAPILLARY
Glucose-Capillary: 293 mg/dL — ABNORMAL HIGH (ref 70–99)
Glucose-Capillary: 255 mg/dL — ABNORMAL HIGH (ref 70–99)
Glucose-Capillary: 291 mg/dL — ABNORMAL HIGH (ref 70–99)

## 2023-04-12 LAB — HEPARIN LEVEL (UNFRACTIONATED): Heparin Unfractionated: 0.35 [IU]/mL (ref 0.30–0.70)

## 2023-04-12 MED ORDER — INSULIN ASPART 100 UNIT/ML IJ SOLN
0.0000 [IU] | Freq: Three times a day (TID) | INTRAMUSCULAR | Status: DC
  Administered 2023-04-12: 11 [IU] via SUBCUTANEOUS
  Administered 2023-04-13: 7 [IU] via SUBCUTANEOUS
  Administered 2023-04-14: 4 [IU] via SUBCUTANEOUS

## 2023-04-12 MED ORDER — EMPAGLIFLOZIN 10 MG PO TABS
10.0000 mg | ORAL_TABLET | Freq: Every day | ORAL | Status: DC
  Administered 2023-04-12: 10 mg via ORAL
  Filled 2023-04-12: qty 1

## 2023-04-12 MED ORDER — INSULIN ASPART 100 UNIT/ML IJ SOLN
0.0000 [IU] | Freq: Every day | INTRAMUSCULAR | Status: DC
  Administered 2023-04-12: 3 [IU] via SUBCUTANEOUS
  Administered 2023-04-13: 2 [IU] via SUBCUTANEOUS

## 2023-04-12 MED ORDER — DICLOFENAC SODIUM 1 % EX GEL
2.0000 g | CUTANEOUS | Status: DC | PRN
  Filled 2023-04-12: qty 100

## 2023-04-12 MED ORDER — INSULIN GLARGINE-YFGN 100 UNIT/ML ~~LOC~~ SOLN
25.0000 [IU] | Freq: Two times a day (BID) | SUBCUTANEOUS | Status: DC
  Administered 2023-04-12 – 2023-04-14 (×4): 25 [IU] via SUBCUTANEOUS
  Filled 2023-04-12 (×6): qty 0.25

## 2023-04-12 MED ORDER — OXYCODONE-ACETAMINOPHEN 5-325 MG PO TABS
1.0000 | ORAL_TABLET | Freq: Three times a day (TID) | ORAL | Status: DC | PRN
  Administered 2023-04-12 (×2): 1 via ORAL
  Administered 2023-04-13 – 2023-04-14 (×3): 2 via ORAL
  Filled 2023-04-12 (×2): qty 2
  Filled 2023-04-12 (×2): qty 1
  Filled 2023-04-12: qty 2

## 2023-04-12 NOTE — Progress Notes (Signed)
ANTICOAGULATION CONSULT NOTE   Pharmacy Consult for Heparin Indication: chest pain/ACS  No Known Allergies  Patient Measurements: Height: 5\' 10"  (177.8 cm) Weight: 104.3 kg (229 lb 15 oz) IBW/kg (Calculated) : 68.5 Heparin Dosing Weight: 90 kg  Vital Signs: Temp: 98.6 F (37 C) (12/29 0416) Temp Source: Oral (12/29 0416) BP: 168/89 (12/29 0416) Pulse Rate: 75 (12/29 0416)  Labs: Recent Labs    04/10/23 1511 04/10/23 1743 04/10/23 2310 04/11/23 0015 04/11/23 0310 04/11/23 1047 04/11/23 1646 04/12/23 0211  HGB 14.8  --   --   --   --   --   --  12.9  HCT 48.1*  --   --   --   --   --   --  42.6  PLT 303  --   --   --   --   --   --  227  HEPARINUNFRC  --   --   --    < >  --  0.27* 0.32 0.35  CREATININE 0.82  --   --   --  0.96  --   --   --   TROPONINIHS 263* 249* 231*  --   --   --   --   --    < > = values in this interval not displayed.    Estimated Creatinine Clearance: 75.3 mL/min (by C-G formula based on SCr of 0.96 mg/dL).   Medical History: Past Medical History:  Diagnosis Date   Acid reflux    Anxiety    Asthma    CAD (coronary artery disease)    Stent to the circumflex 2017.   Degenerative disc disease    Depression    Diabetes mellitus    Hypertension    Hypoglycemia 12/30/2016   PVD (peripheral vascular disease) (HCC)    Stroke (HCC)    04/22/12    Medications:  Medications Prior to Admission  Medication Sig Dispense Refill Last Dose/Taking   amLODipine (NORVASC) 5 MG tablet Take 5 mg by mouth daily.   Past Week   atorvastatin (LIPITOR) 80 MG tablet TAKE 1 TABLET EVERY DAY AT 6 PM (Patient taking differently: daily.) 15 tablet 0 Past Week   buPROPion (WELLBUTRIN XL) 300 MG 24 hr tablet Take 300 mg by mouth daily.   Past Week   carvedilol (COREG) 12.5 MG tablet TAKE 1 TABLET (12.5MG  TOTAL) BY MOUTH TWICE A DAY WITH MEALS 180 tablet 3 Past Week   cilostazol (PLETAL) 50 MG tablet TAKE 1 TABLET BY MOUTH TWICE A DAY 180 tablet 2 Past Week    cloNIDine (CATAPRES) 0.1 MG tablet Take 0.1 mg by mouth daily as needed (Blood pressure).   Past Week   clopidogrel (PLAVIX) 75 MG tablet Take 1 tablet (75 mg total) by mouth daily. 60 tablet 2 Past Week   Cyanocobalamin (B-12) 1000 MCG CAPS Take 1,000 mcg by mouth daily.    Past Week   cyclobenzaprine (FLEXERIL) 10 MG tablet Take 10 mg by mouth 3 (three) times daily as needed.   Past Week   diclofenac sodium (VOLTAREN) 1 % GEL Apply 4 g topically 4 (four) times daily. 1 Tube 0 Past Week   fluticasone (FLONASE) 50 MCG/ACT nasal spray Place 2 sprays into both nostrils as needed.   Past Month   furosemide (LASIX) 20 MG tablet Take 1 tablet (20 mg total) by mouth daily. 3 tablet 0 Past Week   gabapentin (NEURONTIN) 300 MG capsule Take 300 mg by mouth 2 (two)  times daily.   Past Week   JARDIANCE 10 MG TABS tablet Take 10 mg by mouth daily.   Past Week   LANTUS SOLOSTAR 100 UNIT/ML Solostar Pen Inject 50 Units into the skin in the morning and at bedtime.   Past Week   LINZESS 145 MCG CAPS capsule Take 145 mcg by mouth daily.   Past Week   metFORMIN (GLUCOPHAGE) 1000 MG tablet Take 1,000 mg by mouth 2 (two) times daily.   Past Week   MOVANTIK 25 MG TABS tablet Take 25 mg by mouth daily before breakfast.   5 Past Month   naloxone (NARCAN) nasal spray 4 mg/0.1 mL Place 0.4 mg into the nose once.   Taking   oxyCODONE-acetaminophen (PERCOCET) 10-325 MG tablet Take 1 tablet by mouth 4 (four) times daily as needed.   04/10/2023 Morning   polyethylene glycol (MIRALAX / GLYCOLAX) packet Take 17 g by mouth daily as needed. 14 each 0 Past Week   potassium chloride (KLOR-CON) 10 MEQ tablet Take 1 tablet (10 mEq total) by mouth daily. (Patient taking differently: Take 10 mEq by mouth as needed.) 3 tablet 0 Past Week   traZODone (DESYREL) 50 MG tablet Take 50 mg by mouth at bedtime as needed for sleep.    Past Week   albuterol (PROVENTIL HFA;VENTOLIN HFA) 108 (90 Base) MCG/ACT inhaler Inhale 2 puffs into the lungs  every 6 (six) hours as needed for wheezing or shortness of breath. (Patient not taking: Reported on 04/11/2023)   Not Taking   pregabalin (LYRICA) 50 MG capsule Take 50 mg by mouth 3 (three) times daily. (Patient not taking: Reported on 04/11/2023)   Not Taking   Scheduled:   amLODipine  10 mg Oral Daily   aspirin EC  81 mg Oral Daily   atorvastatin  80 mg Oral Daily   carvedilol  12.5 mg Oral BID WC   clopidogrel  75 mg Oral Daily   cyclobenzaprine  10 mg Oral TID   gabapentin  300 mg Oral TID   Infusions:   heparin 1,450 Units/hr (04/12/23 0426)   PRN: acetaminophen, hydrALAZINE, nitroGLYCERIN, ondansetron (ZOFRAN) IV, traZODone  Assessment: 36 yof with a history of CAD. Patient is presenting with chest pain. Cardiology planning for LHC early this week. Heparin per pharmacy consult placed for chest pain/ACS. Patient is not on anticoagulation prior to arrival.  Last CBC: Hgb 12.9; plt 227 AM update: HL 0.35- therapeutic on heparin 1450 units/hr. No bleeding reported.  Goal of Therapy:  Heparin level 0.3-0.7 units/ml Monitor platelets by anticoagulation protocol: Yes   Plan:  Continue heparin infusion 1450 units/hr  Follow up daily anti-Xa level while on heparin Continue to monitor H&H and platelets   Thank you for allowing pharmacy to be a part of this patients care.   Jeanella Cara, PharmD, [facility] Clinical Pharmacist Please see AMION for all Pharmacists' Contact Phone Numbers 04/12/2023, 7:27 AM

## 2023-04-12 NOTE — Progress Notes (Signed)
° °  Rounding Note    Patient Name: Miranda [name] Date of Encounter: 04/12/2023  [facility] Cardiologist: Nanetta Batty, MD   Subjective   NAEO.   Vital Signs    Vitals:   04/11/23 1918 04/12/23 0002 04/12/23 0416 04/12/23 0838  BP: (!) 144/71 (!) 181/104 (!) 168/89 (!) 152/68  Pulse: 66 80 75 77  Resp: 18 18 18 20   Temp: 98.9 F (37.2 C) 98.2 F (36.8 C) 98.6 F (37 C) 98.7 F (37.1 C)  TempSrc: Oral Oral Oral Oral  SpO2: 93% 95% 94% 98%  Weight:  104.3 kg    Height:        Intake/Output Summary (Last 24 hours) at 04/12/2023 1010 Last data filed at 04/12/2023 0426 Gross per 24 hour  Intake 183.72 ml  Output 150 ml  Net 33.72 ml      04/12/2023   12:02 AM 04/11/2023    3:41 PM 04/10/2023    5:33 PM  Last 3 Weights  Weight (lbs) 229 lb 15 oz 229 lb 4.5 oz 220 lb 7.4 oz  Weight (kg) 104.3 kg 104 kg 100 kg      Telemetry    Personally Reviewed  ECG    Personally Reviewed  Physical Exam   GEN: No acute distress. obese  Cardiac: RRR, no murmurs, rubs, or gallops.  Respiratory: Clear to auscultation bilaterally. Psych: Normal affect   Assessment & Plan    #Unstable angina Chest pain has now resolved. Hemodynamically stable. - cont heparin - cont aspirin - cont statin Plan for LHC early this week.  #HTN Above goal right now. - cont home medications - add hydralazine prn     Sheria Lang T. Lalla Brothers, MD, [facility], [facility] Cardiac Electrophysiology

## 2023-04-12 NOTE — H&P (View-Only) (Signed)
   Rounding Note    Patient Name: Miranda [name] Date of Encounter: 04/12/2023  [facility] Cardiologist: Nanetta Batty, MD   Subjective   NAEO.   Vital Signs    Vitals:   04/11/23 1918 04/12/23 0002 04/12/23 0416 04/12/23 0838  BP: (!) 144/71 (!) 181/104 (!) 168/89 (!) 152/68  Pulse: 66 80 75 77  Resp: 18 18 18 20   Temp: 98.9 F (37.2 C) 98.2 F (36.8 C) 98.6 F (37 C) 98.7 F (37.1 C)  TempSrc: Oral Oral Oral Oral  SpO2: 93% 95% 94% 98%  Weight:  104.3 kg    Height:        Intake/Output Summary (Last 24 hours) at 04/12/2023 1010 Last data filed at 04/12/2023 0426 Gross per 24 hour  Intake 183.72 ml  Output 150 ml  Net 33.72 ml      04/12/2023   12:02 AM 04/11/2023    3:41 PM 04/10/2023    5:33 PM  Last 3 Weights  Weight (lbs) 229 lb 15 oz 229 lb 4.5 oz 220 lb 7.4 oz  Weight (kg) 104.3 kg 104 kg 100 kg      Telemetry    Personally Reviewed  ECG    Personally Reviewed  Physical Exam   GEN: No acute distress. obese  Cardiac: RRR, no murmurs, rubs, or gallops.  Respiratory: Clear to auscultation bilaterally. Psych: Normal affect   Assessment & Plan    #Unstable angina Chest pain has now resolved. Hemodynamically stable. - cont heparin - cont aspirin - cont statin Plan for LHC early this week.  #HTN Above goal right now. - cont home medications - add hydralazine prn     Sheria Lang T. Lalla Brothers, MD, [facility], [facility] Cardiac Electrophysiology

## 2023-04-13 ENCOUNTER — Encounter (HOSPITAL_COMMUNITY)
Admission: EM | Disposition: A | Discharge status: Home / Self Care | Payer: Self-pay | Source: Home / Self Care | Attending: Cardiology

## 2023-04-13 DIAGNOSIS — I2510 Atherosclerotic heart disease of native coronary artery without angina pectoris: Secondary | ICD-10-CM | POA: Diagnosis not present

## 2023-04-13 HISTORY — PX: CORONARY STENT INTERVENTION: CATH118234

## 2023-04-13 HISTORY — PX: CORONARY BALLOON ANGIOPLASTY: CATH118233

## 2023-04-13 HISTORY — PX: LEFT HEART CATH AND CORONARY ANGIOGRAPHY: CATH118249

## 2023-04-13 LAB — CBC
HCT: 44.3 % (ref 36.0–46.0)
Hemoglobin: 13.4 g/dL (ref 12.0–15.0)
MCH: 27.5 pg (ref 26.0–34.0)
MCHC: 30.2 g/dL (ref 30.0–36.0)
MCV: 90.8 fL (ref 80.0–100.0)
Platelets: 221 10*3/uL (ref 150–400)
RBC: 4.88 MIL/uL (ref 3.87–5.11)
RDW: 14.4 % (ref 11.5–15.5)
WBC: 8.2 10*3/uL (ref 4.0–10.5)
nRBC: 0 % (ref 0.0–0.2)

## 2023-04-13 LAB — POCT ACTIVATED CLOTTING TIME
Activated Clotting Time: 285 s
Activated Clotting Time: 343 s

## 2023-04-13 LAB — GLUCOSE, CAPILLARY
Glucose-Capillary: 203 mg/dL — ABNORMAL HIGH (ref 70–99)
Glucose-Capillary: 257 mg/dL — ABNORMAL HIGH (ref 70–99)
Glucose-Capillary: 137 mg/dL — ABNORMAL HIGH (ref 70–99)
Glucose-Capillary: 145 mg/dL — ABNORMAL HIGH (ref 70–99)
Glucose-Capillary: 184 mg/dL — ABNORMAL HIGH (ref 70–99)
Glucose-Capillary: 246 mg/dL — ABNORMAL HIGH (ref 70–99)

## 2023-04-13 LAB — HEMOGLOBIN A1C
Hgb A1c MFr Bld: 8.3 % — ABNORMAL HIGH (ref 4.8–5.6)
Mean Plasma Glucose: 192 mg/dL

## 2023-04-13 LAB — LIPOPROTEIN A (LPA): Lipoprotein (a): 176 nmol/L — ABNORMAL HIGH (ref <75.0)

## 2023-04-13 LAB — HEPARIN LEVEL (UNFRACTIONATED): Heparin Unfractionated: 0.45 [IU]/mL (ref 0.30–0.70)

## 2023-04-13 SURGERY — LEFT HEART CATH AND CORONARY ANGIOGRAPHY
Anesthesia: LOCAL

## 2023-04-13 MED ORDER — TICAGRELOR 90 MG PO TABS
90.0000 mg | ORAL_TABLET | Freq: Two times a day (BID) | ORAL | Status: DC
  Administered 2023-04-13 – 2023-04-14 (×2): 90 mg via ORAL
  Filled 2023-04-13 (×2): qty 1

## 2023-04-13 MED ORDER — VERAPAMIL HCL 2.5 MG/ML IV SOLN
INTRAVENOUS | Status: AC
  Filled 2023-04-13: qty 2

## 2023-04-13 MED ORDER — ASPIRIN 81 MG PO CHEW: 81.0000 mg | CHEWABLE_TABLET | ORAL | Status: DC

## 2023-04-13 MED ORDER — LABETALOL HCL 5 MG/ML IV SOLN
INTRAVENOUS | Status: AC
  Filled 2023-04-13: qty 4

## 2023-04-13 MED ORDER — SODIUM CHLORIDE 0.9 % IV SOLN: 250.0000 mL | INTRAVENOUS | Status: DC | PRN

## 2023-04-13 MED ORDER — FENTANYL CITRATE (PF) 100 MCG/2ML IJ SOLN
INTRAMUSCULAR | Status: DC | PRN
  Administered 2023-04-13: 25 ug via INTRAVENOUS
  Administered 2023-04-13 (×2): 50 ug via INTRAVENOUS

## 2023-04-13 MED ORDER — LIDOCAINE HCL (PF) 1 % IJ SOLN
INTRAMUSCULAR | Status: DC | PRN
  Administered 2023-04-13: 5 mL

## 2023-04-13 MED ORDER — INSULIN ASPART 100 UNIT/ML IJ SOLN
INTRAMUSCULAR | Status: AC
  Administered 2023-04-13: 11 [IU] via SUBCUTANEOUS
  Filled 2023-04-13: qty 1

## 2023-04-13 MED ORDER — HEPARIN SODIUM (PORCINE) 1000 UNIT/ML IJ SOLN
INTRAMUSCULAR | Status: AC
  Filled 2023-04-13: qty 10

## 2023-04-13 MED ORDER — TICAGRELOR 90 MG PO TABS
ORAL_TABLET | ORAL | Status: DC | PRN
  Administered 2023-04-13: 180 mg via ORAL

## 2023-04-13 MED ORDER — ONDANSETRON HCL 4 MG/2ML IJ SOLN
INTRAMUSCULAR | Status: AC
  Filled 2023-04-13: qty 2

## 2023-04-13 MED ORDER — HYDRALAZINE HCL 20 MG/ML IJ SOLN
INTRAMUSCULAR | Status: DC | PRN
  Administered 2023-04-13: 10 mg via INTRAVENOUS

## 2023-04-13 MED ORDER — SODIUM CHLORIDE 0.9 % WEIGHT BASED INFUSION: 3.0000 mL/kg/h | INTRAVENOUS | Status: DC

## 2023-04-13 MED ORDER — NITROGLYCERIN 1 MG/10 ML FOR IR/CATH LAB
INTRA_ARTERIAL | Status: DC | PRN
  Administered 2023-04-13 (×3): 200 ug via INTRACORONARY

## 2023-04-13 MED ORDER — TICAGRELOR 90 MG PO TABS
ORAL_TABLET | ORAL | Status: AC
  Filled 2023-04-13: qty 2

## 2023-04-13 MED ORDER — HEPARIN (PORCINE) IN NACL 2000-0.9 UNIT/L-% IV SOLN
INTRAVENOUS | Status: DC | PRN
  Administered 2023-04-13: 1000 mL

## 2023-04-13 MED ORDER — SODIUM CHLORIDE 0.9% FLUSH: 3.0000 mL | INTRAVENOUS | Status: DC | PRN

## 2023-04-13 MED ORDER — MIDAZOLAM HCL 2 MG/2ML IJ SOLN
INTRAMUSCULAR | Status: AC
Start: 2023-04-13 — End: ?
  Filled 2023-04-13: qty 2

## 2023-04-13 MED ORDER — FENTANYL CITRATE (PF) 100 MCG/2ML IJ SOLN
INTRAMUSCULAR | Status: AC
  Filled 2023-04-13: qty 2

## 2023-04-13 MED ORDER — HEPARIN (PORCINE) IN NACL 1000-0.9 UT/500ML-% IV SOLN
INTRAVENOUS | Status: DC | PRN
  Administered 2023-04-13: 500 mL

## 2023-04-13 MED ORDER — SODIUM CHLORIDE 0.9 % IV SOLN: INTRAVENOUS | Status: AC

## 2023-04-13 MED ORDER — HEPARIN SODIUM (PORCINE) 1000 UNIT/ML IJ SOLN
INTRAMUSCULAR | Status: DC | PRN
  Administered 2023-04-13: 5000 [IU] via INTRAVENOUS
  Administered 2023-04-13: 2000 [IU] via INTRAVENOUS
  Administered 2023-04-13: 5000 [IU] via INTRAVENOUS

## 2023-04-13 MED ORDER — LABETALOL HCL 5 MG/ML IV SOLN
10.0000 mg | INTRAVENOUS | Status: AC | PRN
Start: 2023-04-13 — End: 2023-04-13

## 2023-04-13 MED ORDER — SODIUM CHLORIDE 0.9 % WEIGHT BASED INFUSION: 1.0000 mL/kg/h | INTRAVENOUS | Status: DC

## 2023-04-13 MED ORDER — HYDRALAZINE HCL 20 MG/ML IJ SOLN
INTRAMUSCULAR | Status: AC
  Filled 2023-04-13: qty 1

## 2023-04-13 MED ORDER — IOHEXOL 350 MG/ML SOLN
INTRAVENOUS | Status: DC | PRN
  Administered 2023-04-13: 135 mL

## 2023-04-13 MED ORDER — MIDAZOLAM HCL 2 MG/2ML IJ SOLN
INTRAMUSCULAR | Status: AC
  Filled 2023-04-13: qty 2

## 2023-04-13 MED ORDER — LIDOCAINE HCL (PF) 1 % IJ SOLN
INTRAMUSCULAR | Status: AC
Start: 2023-04-13 — End: ?
  Filled 2023-04-13: qty 30

## 2023-04-13 MED ORDER — LABETALOL HCL 5 MG/ML IV SOLN
INTRAVENOUS | Status: DC | PRN
  Administered 2023-04-13: 10 mg via INTRAVENOUS

## 2023-04-13 MED ORDER — SODIUM CHLORIDE 0.9 % WEIGHT BASED INFUSION
1.0000 mL/kg/h | INTRAVENOUS | Status: DC
  Administered 2023-04-13: 1 mL/kg/h via INTRAVENOUS

## 2023-04-13 MED ORDER — NITROGLYCERIN 1 MG/10 ML FOR IR/CATH LAB
INTRA_ARTERIAL | Status: AC
  Filled 2023-04-13: qty 10

## 2023-04-13 MED ORDER — BUPROPION HCL ER (XL) 150 MG PO TB24
300.0000 mg | ORAL_TABLET | Freq: Every day | ORAL | Status: DC
  Administered 2023-04-14: 300 mg via ORAL
  Filled 2023-04-13: qty 2

## 2023-04-13 MED ORDER — MIDAZOLAM HCL 2 MG/2ML IJ SOLN
INTRAMUSCULAR | Status: DC | PRN
  Administered 2023-04-13: 1 mg via INTRAVENOUS
  Administered 2023-04-13: 2 mg via INTRAVENOUS

## 2023-04-13 MED ORDER — SODIUM CHLORIDE 0.9% FLUSH
3.0000 mL | Freq: Two times a day (BID) | INTRAVENOUS | Status: DC
  Administered 2023-04-13 – 2023-04-14 (×3): 3 mL via INTRAVENOUS

## 2023-04-13 MED ORDER — ASPIRIN 81 MG PO CHEW
81.0000 mg | CHEWABLE_TABLET | ORAL | Status: AC
  Administered 2023-04-13: 81 mg via ORAL
  Filled 2023-04-13: qty 1

## 2023-04-13 SURGICAL SUPPLY — 18 items
BALLN EMERGE MR 2.0X12 (BALLOONS) ×2
BALLN ~~LOC~~ EMERGE MR 3.5X12 (BALLOONS) ×1
BALLOON EMERGE MR 2.0X12 (BALLOONS) IMPLANT
BALLOON ~~LOC~~ EMERGE MR 3.5X12 (BALLOONS) IMPLANT
CATH 5FR JL3.5 JR4 ANG PIG MP (CATHETERS) IMPLANT
CATH LAUNCHER 6FR EBU 3 (CATHETERS) IMPLANT
DEVICE RAD COMP TR BAND LRG (VASCULAR PRODUCTS) IMPLANT
GLIDESHEATH SLEND SS 6F .021 (SHEATH) IMPLANT
GUIDEWIRE INQWIRE 1.5J.035X260 (WIRE) IMPLANT
INQWIRE 1.5J .035X260CM (WIRE) ×1
KIT ENCORE 26 ADVANTAGE (KITS) IMPLANT
KIT HEMO VALVE WATCHDOG (MISCELLANEOUS) IMPLANT
KIT SYRINGE INJ CVI SPIKEX1 (MISCELLANEOUS) IMPLANT
PACK CARDIAC CATHETERIZATION (CUSTOM PROCEDURE TRAY) ×1 IMPLANT
SET ATX-X65L (MISCELLANEOUS) IMPLANT
STENT SYNERGY XD 3.0X16 (Permanent Stent) IMPLANT
SYNERGY XD 3.0X16 (Permanent Stent) ×1 IMPLANT
WIRE HI TORQ WHISPER MS 190CM (WIRE) IMPLANT

## 2023-04-13 NOTE — Progress Notes (Signed)
ANTICOAGULATION CONSULT NOTE   Pharmacy Consult for Heparin Indication: chest pain/ACS  No Known Allergies  Patient Measurements: Height: 5\' 10"  (177.8 cm) Weight: 104.3 kg (229 lb 15 oz) IBW/kg (Calculated) : 68.5 Heparin Dosing Weight: 90 kg  Vital Signs: Temp: 98 F (36.7 C) (12/30 0457) Temp Source: Oral (12/30 0457) BP: 155/69 (12/30 0457) Pulse Rate: 61 (12/30 0457)  Labs: Recent Labs    04/10/23 1511 04/10/23 1743 04/10/23 2310 04/11/23 0015 04/11/23 0310 04/11/23 1047 04/11/23 1646 04/12/23 0211 04/13/23 0238  HGB 14.8  --   --   --   --   --   --  12.9 13.4  HCT 48.1*  --   --   --   --   --   --  42.6 44.3  PLT 303  --   --   --   --   --   --  227 221  HEPARINUNFRC  --   --   --    < >  --    < > 0.32 0.35 0.45  CREATININE 0.82  --   --   --  0.96  --   --   --   --   TROPONINIHS 263* 249* 231*  --   --   --   --   --   --    < > = values in this interval not displayed.    Estimated Creatinine Clearance: 75.3 mL/min (by C-G formula based on SCr of 0.96 mg/dL).   Medical History: Past Medical History:  Diagnosis Date   Acid reflux    Anxiety    Asthma    CAD (coronary artery disease)    Stent to the circumflex 2017.   Degenerative disc disease    Depression    Diabetes mellitus    Hypertension    Hypoglycemia 12/30/2016   PVD (peripheral vascular disease) (HCC)    Stroke (HCC)    04/22/12    Medications:  Medications Prior to Admission  Medication Sig Dispense Refill Last Dose/Taking   amLODipine (NORVASC) 5 MG tablet Take 5 mg by mouth daily.   Past Week   atorvastatin (LIPITOR) 80 MG tablet TAKE 1 TABLET EVERY DAY AT 6 PM (Patient taking differently: daily.) 15 tablet 0 Past Week   buPROPion (WELLBUTRIN XL) 300 MG 24 hr tablet Take 300 mg by mouth daily.   Past Week   carvedilol (COREG) 12.5 MG tablet TAKE 1 TABLET (12.5MG  TOTAL) BY MOUTH TWICE A DAY WITH MEALS 180 tablet 3 Past Week   cilostazol (PLETAL) 50 MG tablet TAKE 1 TABLET BY  MOUTH TWICE A DAY 180 tablet 2 Past Week   cloNIDine (CATAPRES) 0.1 MG tablet Take 0.1 mg by mouth daily as needed (Blood pressure).   Past Week   clopidogrel (PLAVIX) 75 MG tablet Take 1 tablet (75 mg total) by mouth daily. 60 tablet 2 Past Week   Cyanocobalamin (B-12) 1000 MCG CAPS Take 1,000 mcg by mouth daily.    Past Week   cyclobenzaprine (FLEXERIL) 10 MG tablet Take 10 mg by mouth 3 (three) times daily as needed.   Past Week   diclofenac sodium (VOLTAREN) 1 % GEL Apply 4 g topically 4 (four) times daily. 1 Tube 0 Past Week   fluticasone (FLONASE) 50 MCG/ACT nasal spray Place 2 sprays into both nostrils as needed.   Past Month   furosemide (LASIX) 20 MG tablet Take 1 tablet (20 mg total) by mouth daily. 3 tablet 0 Past  Week   gabapentin (NEURONTIN) 300 MG capsule Take 300 mg by mouth 2 (two) times daily.   Past Week   JARDIANCE 10 MG TABS tablet Take 10 mg by mouth daily.   Past Week   LANTUS SOLOSTAR 100 UNIT/ML Solostar Pen Inject 50 Units into the skin in the morning and at bedtime.   Past Week   LINZESS 145 MCG CAPS capsule Take 145 mcg by mouth daily.   Past Week   metFORMIN (GLUCOPHAGE) 1000 MG tablet Take 1,000 mg by mouth 2 (two) times daily.   Past Week   MOVANTIK 25 MG TABS tablet Take 25 mg by mouth daily before breakfast.   5 Past Month   naloxone (NARCAN) nasal spray 4 mg/0.1 mL Place 0.4 mg into the nose once.   Taking   oxyCODONE-acetaminophen (PERCOCET) 10-325 MG tablet Take 1 tablet by mouth 4 (four) times daily as needed.   04/10/2023 Morning   polyethylene glycol (MIRALAX / GLYCOLAX) packet Take 17 g by mouth daily as needed. 14 each 0 Past Week   potassium chloride (KLOR-CON) 10 MEQ tablet Take 1 tablet (10 mEq total) by mouth daily. (Patient taking differently: Take 10 mEq by mouth as needed.) 3 tablet 0 Past Week   traZODone (DESYREL) 50 MG tablet Take 50 mg by mouth at bedtime as needed for sleep.    Past Week   albuterol (PROVENTIL HFA;VENTOLIN HFA) 108 (90 Base)  MCG/ACT inhaler Inhale 2 puffs into the lungs every 6 (six) hours as needed for wheezing or shortness of breath. (Patient not taking: Reported on 04/11/2023)   Not Taking   pregabalin (LYRICA) 50 MG capsule Take 50 mg by mouth 3 (three) times daily. (Patient not taking: Reported on 04/11/2023)   Not Taking   Scheduled:   [MAR Hold] amLODipine  10 mg Oral Daily   [MAR Hold] aspirin EC  81 mg Oral Daily   [MAR Hold] atorvastatin  80 mg Oral Daily   [MAR Hold] buPROPion  300 mg Oral Daily   [MAR Hold] carvedilol  12.5 mg Oral BID WC   [MAR Hold] clopidogrel  75 mg Oral Daily   [MAR Hold] cyclobenzaprine  10 mg Oral TID   [MAR Hold] empagliflozin  10 mg Oral Daily   [MAR Hold] gabapentin  300 mg Oral TID   [MAR Hold] insulin aspart  0-20 Units Subcutaneous TID WC   [MAR Hold] insulin aspart  0-5 Units Subcutaneous QHS   [MAR Hold] insulin glargine-yfgn  25 Units Subcutaneous BID   Infusions:   sodium chloride 1 mL/kg/hr (04/13/23 0537)   heparin 1,450 Units/hr (04/13/23 0106)   PRN: [ZOX Hold] acetaminophen, [MAR Hold] diclofenac Sodium, fentaNYL, Heparin (Porcine) in NaCl, heparin sodium (porcine), [MAR Hold] hydrALAZINE, lidocaine (PF), midazolam, [MAR Hold] nitroGLYCERIN, [MAR Hold] ondansetron (ZOFRAN) IV, [MAR Hold] oxyCODONE-acetaminophen, [MAR Hold] traZODone  Assessment: 46 yof with a history of CAD. Patient is presenting with chest pain. Cardiology planning for LHC early this week. Heparin per pharmacy consult placed for chest pain/ACS. Patient is not on anticoagulation prior to arrival.  Heparin drip rate 1450 uts/hr with heparin level 0.45 - at goal.  No bleeding noted, CBC stable   Goal of Therapy:  Heparin level 0.3-0.7 units/ml Monitor platelets by anticoagulation protocol: Yes   Plan:  Continue heparin infusion 1450 units/hr  Continue to monitor H&H and platelets and heparin level Follow up after cath   Leota Sauers Pharm.D. CPP, BCPS Clinical  Pharmacist [phone] 04/13/2023 8:06 AM   Please  see AMION for all Pharmacists' Contact Phone Numbers 04/13/2023, 8:05 AM

## 2023-04-13 NOTE — Progress Notes (Signed)
Provisional PCI to LAD/diag D/C 12/31 AM

## 2023-04-13 NOTE — Interval H&P Note (Signed)
  Cath Lab Visit (complete for each Cath Lab visit)  Clinical Evaluation Leading to the Procedure:   ACS: Yes.    Non-ACS:    Anginal Classification: CCS IV  Anti-ischemic medical therapy: Maximal Therapy (2 or more classes of medications)  Non-Invasive Test Results: No non-invasive testing performed  Prior CABG: No previous CABG      History and Physical Interval Note:  04/13/2023 7:02 AM  Miranda [name]  has presented today for surgery, with the diagnosis of NSTEMI.  The various methods of treatment have been discussed with the patient and family. After consideration of risks, benefits and other options for treatment, the patient has consented to  Procedure(s): LEFT HEART CATH AND CORONARY ANGIOGRAPHY (N/A) as a surgical intervention.  The patient's history has been reviewed, patient examined, no change in status, stable for surgery.  I have reviewed the patient's chart and labs.  Questions were answered to the patient's satisfaction.     Tonny Bollman

## 2023-04-13 NOTE — Plan of Care (Signed)
  Problem: Education: Goal: Understanding of cardiac disease, CV risk reduction, and recovery process will improve Outcome: Progressing Goal: Individualized Educational Video(s) Outcome: Progressing   Problem: Activity: Goal: Ability to tolerate increased activity will improve Outcome: Progressing   Problem: Cardiac: Goal: Ability to achieve and maintain adequate cardiovascular perfusion will improve Outcome: Progressing   Problem: Health Behavior/Discharge Planning: Goal: Ability to safely manage health-related needs after discharge will improve Outcome: Progressing   Problem: Education: Goal: Knowledge of General Education information will improve Description: Including pain rating scale, medication(s)/side effects and non-pharmacologic comfort measures Outcome: Progressing   Problem: Health Behavior/Discharge Planning: Goal: Ability to manage health-related needs will improve Outcome: Progressing   Problem: Clinical Measurements: Goal: Ability to maintain clinical measurements within normal limits will improve Outcome: Progressing Goal: Will remain free from infection Outcome: Progressing Goal: Diagnostic test results will improve Outcome: Progressing Goal: Respiratory complications will improve Outcome: Progressing Goal: Cardiovascular complication will be avoided Outcome: Progressing   Problem: Activity: Goal: Risk for activity intolerance will decrease Outcome: Progressing   Problem: Nutrition: Goal: Adequate nutrition will be maintained Outcome: Progressing   Problem: Coping: Goal: Level of anxiety will decrease Outcome: Progressing   Problem: Elimination: Goal: Will not experience complications related to bowel motility Outcome: Progressing Goal: Will not experience complications related to urinary retention Outcome: Progressing   Problem: Pain Management: Goal: General experience of comfort will improve Outcome: Progressing   Problem:  Safety: Goal: Ability to remain free from injury will improve Outcome: Progressing   Problem: Skin Integrity: Goal: Risk for impaired skin integrity will decrease Outcome: Progressing   Problem: Education: Goal: Ability to describe self-care measures that may prevent or decrease complications (Diabetes Survival Skills Education) will improve Outcome: Progressing Goal: Individualized Educational Video(s) Outcome: Progressing   Problem: Coping: Goal: Ability to adjust to condition or change in health will improve Outcome: Progressing   Problem: Fluid Volume: Goal: Ability to maintain a balanced intake and output will improve Outcome: Progressing   Problem: Health Behavior/Discharge Planning: Goal: Ability to identify and utilize available resources and services will improve Outcome: Progressing Goal: Ability to manage health-related needs will improve Outcome: Progressing   Problem: Metabolic: Goal: Ability to maintain appropriate glucose levels will improve Outcome: Progressing   Problem: Nutritional: Goal: Maintenance of adequate nutrition will improve Outcome: Progressing Goal: Progress toward achieving an optimal weight will improve Outcome: Progressing   Problem: Skin Integrity: Goal: Risk for impaired skin integrity will decrease Outcome: Progressing   Problem: Tissue Perfusion: Goal: Adequacy of tissue perfusion will improve Outcome: Progressing   Problem: Education: Goal: Understanding of CV disease, CV risk reduction, and recovery process will improve Outcome: Progressing Goal: Individualized Educational Video(s) Outcome: Progressing   Problem: Activity: Goal: Ability to return to baseline activity level will improve Outcome: Progressing   Problem: Cardiovascular: Goal: Ability to achieve and maintain adequate cardiovascular perfusion will improve Outcome: Progressing Goal: Vascular access site(s) Level 0-1 will be maintained Outcome: Progressing    Problem: Health Behavior/Discharge Planning: Goal: Ability to safely manage health-related needs after discharge will improve Outcome: Progressing

## 2023-04-14 ENCOUNTER — Other Ambulatory Visit (HOSPITAL_COMMUNITY): Payer: Self-pay

## 2023-04-14 ENCOUNTER — Encounter (HOSPITAL_COMMUNITY): Payer: Self-pay | Admitting: Cardiovascular Disease

## 2023-04-14 ENCOUNTER — Telehealth (HOSPITAL_COMMUNITY): Payer: Self-pay | Admitting: Pharmacy Technician

## 2023-04-14 ENCOUNTER — Telehealth: Payer: Self-pay | Admitting: Cardiology

## 2023-04-14 DIAGNOSIS — I214 Non-ST elevation (NSTEMI) myocardial infarction: Secondary | ICD-10-CM | POA: Diagnosis not present

## 2023-04-14 DIAGNOSIS — N390 Urinary tract infection, site not specified: Secondary | ICD-10-CM | POA: Insufficient documentation

## 2023-04-14 DIAGNOSIS — I10 Essential (primary) hypertension: Secondary | ICD-10-CM

## 2023-04-14 LAB — GLUCOSE, CAPILLARY: Glucose-Capillary: 154 mg/dL — ABNORMAL HIGH (ref 70–99)

## 2023-04-14 LAB — CBC
HCT: 42.3 % (ref 36.0–46.0)
Hemoglobin: 12.8 g/dL (ref 12.0–15.0)
MCH: 27.6 pg (ref 26.0–34.0)
MCHC: 30.3 g/dL (ref 30.0–36.0)
MCV: 91.2 fL (ref 80.0–100.0)
Platelets: 211 10*3/uL (ref 150–400)
RBC: 4.64 MIL/uL (ref 3.87–5.11)
RDW: 14.6 % (ref 11.5–15.5)
WBC: 6.1 10*3/uL (ref 4.0–10.5)
nRBC: 0 % (ref 0.0–0.2)

## 2023-04-14 LAB — BASIC METABOLIC PANEL
Anion gap: 6 (ref 5–15)
BUN: 14 mg/dL (ref 8–23)
CO2: 26 mmol/L (ref 22–32)
Calcium: 8.7 mg/dL — ABNORMAL LOW (ref 8.9–10.3)
Chloride: 110 mmol/L (ref 98–111)
Creatinine, Ser: 0.89 mg/dL (ref 0.44–1.00)
GFR, Estimated: >60 mL/min (ref >60)
Glucose, Bld: 210 mg/dL — ABNORMAL HIGH (ref 70–99)
Potassium: 4 mmol/L (ref 3.5–5.1)
Sodium: 142 mmol/L (ref 135–145)

## 2023-04-14 MED ORDER — CIPROFLOXACIN HCL 500 MG PO TABS
250.0000 mg | ORAL_TABLET | Freq: Two times a day (BID) | ORAL | Status: DC
Start: 2023-04-14 — End: 2023-04-14
  Administered 2023-04-14: 250 mg via ORAL
  Filled 2023-04-14: qty 1

## 2023-04-14 MED ORDER — LOSARTAN POTASSIUM 25 MG PO TABS
25.0000 mg | ORAL_TABLET | Freq: Every day | ORAL | Status: DC
  Administered 2023-04-14: 25 mg via ORAL
  Filled 2023-04-14: qty 1

## 2023-04-14 MED ORDER — TICAGRELOR 90 MG PO TABS
90.0000 mg | ORAL_TABLET | Freq: Two times a day (BID) | ORAL | 2 refills | Status: DC
  Filled 2023-04-14: qty 180, 90d supply, fill #0

## 2023-04-14 MED ORDER — ASPIRIN 81 MG PO TBEC
81.0000 mg | DELAYED_RELEASE_TABLET | Freq: Every day | ORAL | 2 refills | Status: AC
  Filled 2023-04-14: qty 90, 90d supply, fill #0

## 2023-04-14 MED ORDER — AMLODIPINE BESYLATE 10 MG PO TABS
10.0000 mg | ORAL_TABLET | Freq: Every day | ORAL | 1 refills | Status: DC
  Filled 2023-04-14: qty 90, 90d supply, fill #0

## 2023-04-14 MED ORDER — LOSARTAN POTASSIUM 25 MG PO TABS
25.0000 mg | ORAL_TABLET | Freq: Every day | ORAL | 1 refills | Status: DC
  Filled 2023-04-14: qty 90, 90d supply, fill #0

## 2023-04-14 MED ORDER — CIPROFLOXACIN HCL 250 MG PO TABS
250.0000 mg | ORAL_TABLET | Freq: Two times a day (BID) | ORAL | 0 refills | Status: AC
  Filled 2023-04-14: qty 5, 3d supply, fill #0

## 2023-04-14 MED ORDER — NITROGLYCERIN 0.4 MG SL SUBL
0.4000 mg | SUBLINGUAL_TABLET | SUBLINGUAL | 2 refills | Status: AC | PRN
  Filled 2023-04-14: qty 25, 7d supply, fill #0

## 2023-04-14 NOTE — Discharge Summary (Signed)
 Discharge Summary    Patient ID: Miranda [name] MRN: [id number]; DOB: 05/17/1955  Admit date: 04/10/2023 Discharge date: 04/14/2023  PCP:  Shelda Atlas, MD   [facility] Providers Cardiologist:  Dorn Lesches, MD     Discharge Diagnoses    Principal Problem:   NSTEMI (non-ST elevated myocardial infarction) [facility]) Active Problems:   Diabetes mellitus (HCC)   Essential hypertension   Dyslipidemia   Tobacco abuse   UTI (urinary tract infection)  Diagnostic Studies/Procedures    Cath: 04/13/2023  1.  Severe stenosis of the proximal LAD/first diagonal bifurcation treated successfully with PCI using a provisional approach, stenting of the LAD and balloon angioplasty of the diagonal (3.0 x 16 mm Synergy DES in the LAD) 2.  Continued patency of the proximal circumflex stent (left dominant circumflex) 3.  Patency of the nondominant RCA 4.  Moderately elevated LVEDP   Recommendations: Aggressive risk reduction measures, DAPT with aspirin  and ticagrelor  at least 12 months without interruption, discharge tomorrow if no early complications arise  Diagnostic Dominance: Left  Intervention    Echo: 04/11/2023  IMPRESSIONS     1. Left ventricular ejection fraction, by estimation, is >75%. Left  ventricular ejection fraction by PLAX is 76 %. The left ventricle has  hyperdynamic function. The left ventricle has no regional wall motion  abnormalities. There is mild left ventricular   hypertrophy. Left ventricular diastolic parameters are consistent with  Grade I diastolic dysfunction (impaired relaxation). Elevated left  ventricular end-diastolic pressure.   2. Right ventricular systolic function is normal. The right ventricular  size is normal. Tricuspid regurgitation signal is inadequate for assessing  PA pressure.   3. The mitral valve is abnormal. Trivial mitral valve regurgitation.  Moderate mitral annular calcification.   4. The aortic valve is tricuspid.  Aortic valve regurgitation is not  visualized.   5. The inferior vena cava is normal in size with greater than 50%  respiratory variability, suggesting right atrial pressure of 3 mmHg.   FINDINGS   Left Ventricle: Left ventricular ejection fraction, by estimation, is  >75%. Left ventricular ejection fraction by PLAX is 76 %. The left  ventricle has hyperdynamic function. The left ventricle has no regional  wall motion abnormalities. The left  ventricular internal cavity size was normal in size. There is mild left  ventricular hypertrophy. Left ventricular diastolic parameters are  consistent with Grade I diastolic dysfunction (impaired relaxation).  Elevated left ventricular end-diastolic  pressure.   Right Ventricle: The right ventricular size is normal. No increase in  right ventricular wall thickness. Right ventricular systolic function is  normal. Tricuspid regurgitation signal is inadequate for assessing PA  pressure.   Left Atrium: Left atrial size was normal in size.   Right Atrium: Right atrial size was normal in size.   Pericardium: There is no evidence of pericardial effusion.   Mitral Valve: The mitral valve is abnormal. Moderate mitral annular  calcification. Trivial mitral valve regurgitation.   Tricuspid Valve: The tricuspid valve is grossly normal. Tricuspid valve  regurgitation is trivial.   Aortic Valve: The aortic valve is tricuspid. Aortic valve regurgitation is  not visualized.   Pulmonic Valve: The pulmonic valve was normal in structure. Pulmonic valve  regurgitation is not visualized.   Aorta: The aortic root and ascending aorta are structurally normal, with  no evidence of dilitation.   Venous: The inferior vena cava is normal in size with greater than 50%  respiratory variability, suggesting right atrial pressure of 3  mmHg.   IAS/Shunts: No atrial level shunt detected by color flow Doppler.   _____________   History of Present Illness      Miranda [name] is a 67 y.o. female with a history of coronary artery disease.  She cardiac catheterization in 2017 with a 90% proximal AV groove circumflex stenosis and a 60% distal LAD lesion.  She was managed with DES to the circumflex.  EF was normal. Her last evaluation was a negative perfusion study in May 2023.  She has a history of diabetes, hypertension and ongoing tobacco use.  She also has had peripheral vascular disease with occlusion of the mid right SFA and high-grade stenosis of the mid left SFA.  She was not thought to be a good revascularization candidate because of her ongoing tobacco.   She reported that she had been having some chest pain for about a week.  She was not sure that this was similar to her previous angina.  She reported it was a burning discomfort and seemed to happen more at night.  It happened when she was lying down.  She had taken some over-the-counter medications for reflux which seemed to help. She was not very active although she does go up stairs.  This did not bring it on.  Was not with movement or lying flat or position.  In the emergency room she was found to have an elevated troponin.  EKG demonstrated no acute changes.   She was admitted to cardiology for further management.   Hospital Course     NSTEMI -- High-sensitivity troponin 249>> 231.  Underwent cardiac catheterization with severe stenosis of proximal LAD/first diagonal bifurcation treated with PCI/DES x 1, continued patency of proximal circumflex stent.  Recommendations for DAPT with aspirin /Brilinta  for at least 1 year.  Echocardiogram showed LVEF greater than 75%, no regional wall motion abnormally, grade 1 diastolic dysfunction, normal RV. Seen by CR. -- Continue aspirin , Brilinta , atorvastatin  80 mg daily, Coreg  12.5 mg twice daily  Hypertension -- Continue amlodipine  10 mg daily, carvedilol  12.5 mg twice daily. Will add losartan  25mg  daily at discharge  Hyperlipidemia -- LDL 75, HDL 35,  LP(a) 176 -- Continue atorvastatin  80 mg daily -- will refer to lipid clinic   Diabetes -- Hemoglobin A1c 8.3 -- Continue metformin  1000 mg twice daily, home insulin  regimen.  Given UTI will stop Jardiance   UTI -- Presented with frequent urination, positive leukocytes, urine culture + Klebsiella -- Has had several episodes of acute cystitis over the past year -- As above stop Jardiance  -- Start ciprofloxacin  250 mg twice daily x 3 days -- If recurrent issues then would consider outpatient urology follow-up  Tobacco abuse -- Cessation education  General: Well developed, well nourished, female appearing in no acute distress. Head: Normocephalic, atraumatic.  Neck: Supple without bruits, JVD. Lungs:  Resp regular and unlabored, CTA. Heart: RRR, S1, S2, no S3, S4, or murmur; no rub. Abdomen: Soft, non-tender, non-distended with normoactive bowel sounds. No hepatomegaly. No rebound/guarding. No obvious abdominal masses. Extremities: No clubbing, cyanosis, edema. Distal pedal pulses are 2+ bilaterally. Right radial cath site stable without bruising or hematoma Neuro: Alert and oriented X 3. Moves all extremities spontaneously. Psych: Normal affect.  Patient seen by Dr. Elmira and deemed stable for discharge home. Follow up arranged in the office. Medications sent to [facility] pharmacy prior to discharge.   Did the patient have an acute coronary syndrome (MI, NSTEMI, STEMI, etc) this admission?:  Yes  AHA/ACC ACS Clinical Performance & Quality Measures: Aspirin  prescribed? - Yes ADP Receptor Inhibitor (Plavix /Clopidogrel , Brilinta /Ticagrelor  or Effient/Prasugrel) prescribed (includes medically managed patients)? - Yes Beta Blocker prescribed? - Yes High Intensity Statin (Lipitor  40-80mg  or Crestor 20-40mg ) prescribed? - Yes EF assessed during THIS hospitalization? - Yes For EF <40%, was ACEI/ARB prescribed? - Not Applicable (EF >/= 40%) For EF <40%,  Aldosterone Antagonist (Spironolactone or Eplerenone) prescribed? - Not Applicable (EF >/= 40%) Cardiac Rehab Phase II ordered (including medically managed patients)? - Yes   The patient will be scheduled for a TOC follow up appointment in 10-14 days.  A message has been sent to the [facility] and Scheduling Pool at the office where the patient should be seen for follow up.  _____________  Discharge Vitals Blood pressure (!) 144/80, pulse 70, temperature 97.6 F (36.4 C), temperature source Oral, resp. rate 16, height 5' 10 (1.778 m), weight 104.3 kg, SpO2 95%.  Filed Weights   04/10/23 1733 04/11/23 1541 04/12/23 0002  Weight: 100 kg 104 kg 104.3 kg    Labs & Radiologic Studies    CBC Recent Labs    04/13/23 0238 04/14/23 0458  WBC 8.2 6.1  HGB 13.4 12.8  HCT 44.3 42.3  MCV 90.8 91.2  PLT 221 211   Basic Metabolic Panel Recent Labs    87/68/75 0458  NA 142  K 4.0  CL 110  CO2 26  GLUCOSE 210*  BUN 14  CREATININE 0.89  CALCIUM  8.7*   Liver Function Tests No results for input(s): AST, ALT, ALKPHOS, BILITOT, PROT, ALBUMIN in the last 72 hours. No results for input(s): LIPASE, AMYLASE in the last 72 hours. High Sensitivity Troponin:   Recent Labs  Lab 04/10/23 1511 04/10/23 1743 04/10/23 2310  TROPONINIHS 263* 249* 231*    BNP Invalid input(s): POCBNP D-Dimer No results for input(s): DDIMER in the last 72 hours. Hemoglobin A1C Recent Labs    04/12/23 1042  HGBA1C 8.3*   Fasting Lipid Panel No results for input(s): CHOL, HDL, LDLCALC, TRIG, CHOLHDL, LDLDIRECT in the last 72 hours. Thyroid Function Tests No results for input(s): TSH, T4TOTAL, T3FREE, THYROIDAB in the last 72 hours.  Invalid input(s): FREET3 _____________  CARDIAC CATHETERIZATION Result Date: 04/13/2023 1.  Severe stenosis of the proximal LAD/first diagonal bifurcation treated successfully with PCI using a provisional approach, stenting of the  LAD and balloon angioplasty of the diagonal (3.0 x 16 mm Synergy DES in the LAD) 2.  Continued patency of the proximal circumflex stent (left dominant circumflex) 3.  Patency of the nondominant RCA 4.  Moderately elevated LVEDP Recommendations: Aggressive risk reduction measures, DAPT with aspirin  and ticagrelor  at least 12 months without interruption, discharge tomorrow if no early complications arise   ECHOCARDIOGRAM COMPLETE Result Date: 04/11/2023    ECHOCARDIOGRAM REPORT   Patient Name:   Miranda [name] Date of Exam: 04/11/2023 Medical Rec #:  [id number]        Height:       70.0 in Accession #:    [id number]       Weight:       220.5 lb Date of Birth:  Nov 01, 1955        BSA:          2.176 m Patient Age:    67 years         BP:           163/99 mmHg Patient Gender: F  HR:           67 bpm. Exam Location:  Inpatient Procedure: 2D Echo, Color Doppler and Cardiac Doppler Indications:    chest pain  History:        Patient has prior history of Echocardiogram examinations, most                 recent 11/21/2015. CAD, PAD and history of stroke; Risk                 Factors:Hypertension, Dyslipidemia and Current Smoker.  Sonographer:    Tinnie Barefoot RDCS Referring Phys: [phone] HAO MENG IMPRESSIONS  1. Left ventricular ejection fraction, by estimation, is >75%. Left ventricular ejection fraction by PLAX is 76 %. The left ventricle has hyperdynamic function. The left ventricle has no regional wall motion abnormalities. There is mild left ventricular  hypertrophy. Left ventricular diastolic parameters are consistent with Grade I diastolic dysfunction (impaired relaxation). Elevated left ventricular end-diastolic pressure.  2. Right ventricular systolic function is normal. The right ventricular size is normal. Tricuspid regurgitation signal is inadequate for assessing PA pressure.  3. The mitral valve is abnormal. Trivial mitral valve regurgitation. Moderate mitral annular calcification.  4. The  aortic valve is tricuspid. Aortic valve regurgitation is not visualized.  5. The inferior vena cava is normal in size with greater than 50% respiratory variability, suggesting right atrial pressure of 3 mmHg. FINDINGS  Left Ventricle: Left ventricular ejection fraction, by estimation, is >75%. Left ventricular ejection fraction by PLAX is 76 %. The left ventricle has hyperdynamic function. The left ventricle has no regional wall motion abnormalities. The left ventricular internal cavity size was normal in size. There is mild left ventricular hypertrophy. Left ventricular diastolic parameters are consistent with Grade I diastolic dysfunction (impaired relaxation). Elevated left ventricular end-diastolic pressure. Right Ventricle: The right ventricular size is normal. No increase in right ventricular wall thickness. Right ventricular systolic function is normal. Tricuspid regurgitation signal is inadequate for assessing PA pressure. Left Atrium: Left atrial size was normal in size. Right Atrium: Right atrial size was normal in size. Pericardium: There is no evidence of pericardial effusion. Mitral Valve: The mitral valve is abnormal. Moderate mitral annular calcification. Trivial mitral valve regurgitation. Tricuspid Valve: The tricuspid valve is grossly normal. Tricuspid valve regurgitation is trivial. Aortic Valve: The aortic valve is tricuspid. Aortic valve regurgitation is not visualized. Pulmonic Valve: The pulmonic valve was normal in structure. Pulmonic valve regurgitation is not visualized. Aorta: The aortic root and ascending aorta are structurally normal, with no evidence of dilitation. Venous: The inferior vena cava is normal in size with greater than 50% respiratory variability, suggesting right atrial pressure of 3 mmHg. IAS/Shunts: No atrial level shunt detected by color flow Doppler.  LEFT VENTRICLE PLAX 2D LV EF:         Left            Diastology                ventricular     LV e' medial:    4.24  cm/s                ejection        LV E/e' medial:  18.2                fraction by     LV e' lateral:   5.33 cm/s                PLAX  is 76      LV E/e' lateral: 14.5                %. LVIDd:         4.70 cm LVIDs:         2.60 cm LV PW:         1.00 cm LV IVS:        1.10 cm LVOT diam:     2.00 cm LV SV:         83 LV SV Index:   38 LVOT Area:     3.14 cm  RIGHT VENTRICLE             IVC RV Basal diam:  2.50 cm     IVC diam: 1.50 cm RV S prime:     12.00 cm/s TAPSE (M-mode): 1.4 cm LEFT ATRIUM             Index        RIGHT ATRIUM           Index LA diam:        3.60 cm 1.65 cm/m   RA Area:     10.70 cm LA Vol (A2C):   61.4 ml 28.22 ml/m  RA Volume:   19.40 ml  8.92 ml/m LA Vol (A4C):   40.7 ml 18.71 ml/m LA Biplane Vol: 52.3 ml 24.04 ml/m  AORTIC VALVE LVOT Vmax:   112.00 cm/s LVOT Vmean:  76.400 cm/s LVOT VTI:    0.265 m  AORTA Ao Root diam: 3.00 cm Ao Asc diam:  3.00 cm MITRAL VALVE MV Area (PHT): 2.60 cm     SHUNTS MV Decel Time: 292 msec     Systemic VTI:  0.26 m MV E velocity: 77.10 cm/s   Systemic Diam: 2.00 cm MV A velocity: 129.00 cm/s MV E/A ratio:  0.60 Vinie Maxcy MD Electronically signed by Vinie Maxcy MD Signature Date/Time: 04/11/2023/5:51:08 PM    Final    DG Chest 2 View Result Date: 04/10/2023 CLINICAL DATA:  Chest pain.  Shortness of breath. EXAM: CHEST - 2 VIEW COMPARISON:  08/27/2022. FINDINGS: Low lung volume. Bilateral lung fields are clear. Bilateral costophrenic angles are clear. Stable cardio-mediastinal silhouette. No acute osseous abnormalities. The soft tissues are within normal limits. IMPRESSION: No active cardiopulmonary disease. Electronically Signed   By: Ree Molt M.D.   On: 04/10/2023 17:19   Disposition   Pt is being discharged home today in good condition.  Follow-up Plans & Appointments     Discharge Instructions     AMB Referral to Cardiac Rehabilitation - Phase II   Complete by: As directed    Diagnosis: NSTEMI   After initial evaluation  and assessments completed: Virtual Based Care may be provided alone or in conjunction with Phase 2 Cardiac Rehab based on patient barriers.: Yes   Intensive Cardiac Rehabilitation (ICR) [facility] location only OR Traditional Cardiac Rehabilitation (TCR) *If criteria for ICR are not met will enroll in TCR ([facility] only): Yes   AMB Referral to [facility] Pharm[facility]   Complete by: As directed    Reason For Referral: Lipids   Amb Referral to Cardiac Rehabilitation   Complete by: As directed    Diagnosis:  NSTEMI Coronary Stents     After initial evaluation and assessments completed: Virtual Based Care may be provided alone or in conjunction with Phase 2 Cardiac Rehab based on patient barriers.: Yes   Intensive Cardiac Rehabilitation (ICR) [facility] location only OR Traditional  Cardiac Rehabilitation (TCR) *If criteria for ICR are not met will enroll in TCR [facility] only): Yes   Call MD for:  difficulty breathing, headache or visual disturbances   Complete by: As directed    Call MD for:  persistant dizziness or light-headedness   Complete by: As directed    Call MD for:  redness, tenderness, or signs of infection (pain, swelling, redness, odor or green/yellow discharge around incision site)   Complete by: As directed    Diet - low sodium heart healthy   Complete by: As directed    Discharge instructions   Complete by: As directed    Radial Site Care Refer to this sheet in the next few weeks. These instructions provide you with information on caring for yourself after your procedure. Your caregiver may also give you more specific instructions. Your treatment has been planned according to current medical practices, but problems sometimes occur. Call your caregiver if you have any problems or questions after your procedure. HOME CARE INSTRUCTIONS You may shower the day after the procedure. Remove the bandage (dressing) and gently wash the site with plain soap and water. Gently pat the site dry.  Do not apply powder or  lotion to the site.  Do not submerge the affected site in water for 3 to 5 days.  Inspect the site at least twice daily.  Do not flex or bend the affected arm for 24 hours.  No lifting over 5 pounds (2.3 kg) for 5 days after your procedure.  Do not drive home if you are discharged the same day of the procedure. Have someone else drive you.  You may drive 24 hours after the procedure unless otherwise instructed by your caregiver.  What to expect: Any bruising will usually fade within 1 to 2 weeks.  Blood that collects in the tissue (hematoma) may be painful to the touch. It should usually decrease in size and tenderness within 1 to 2 weeks.  SEEK IMMEDIATE MEDICAL CARE IF: You have unusual pain at the radial site.  You have redness, warmth, swelling, or pain at the radial site.  You have drainage (other than a small amount of blood on the dressing).  You have chills.  You have a fever or persistent symptoms for more than 72 hours.  You have a fever and your symptoms suddenly get worse.  Your arm becomes pale, cool, tingly, or numb.  You have heavy bleeding from the site. Hold pressure on the site.   PLEASE DO NOT MISS ANY DOSES OF YOUR BRILINTA !!!!! Also keep a log of you blood pressures and bring back to your follow up appt. Please call the office with any questions.   Patients taking blood thinners should generally stay away from medicines like ibuprofen , Advil , Motrin , naproxen, and Aleve due to risk of stomach bleeding. You may take Tylenol  as directed or talk to your primary doctor about alternatives.   PLEASE ENSURE THAT YOU DO NOT RUN OUT OF YOUR BRILINTA . This medication is very important to remain on for at least one year. IF you have issues obtaining this medication due to cost please CALL the office 3-5 business days prior to running out in order to prevent missing doses of this medication.   Increase activity slowly   Complete by: As directed         Discharge Medications    Allergies as of 04/14/2023   No Known Allergies      Medication List     STOP  taking these medications    cilostazol  50 MG tablet Commonly known as: PLETAL    cloNIDine  0.1 MG tablet Commonly known as: CATAPRES    clopidogrel  75 MG tablet Commonly known as: PLAVIX    Jardiance  10 MG Tabs tablet Generic drug: empagliflozin    pregabalin  50 MG capsule Commonly known as: LYRICA        TAKE these medications    albuterol  108 (90 Base) MCG/ACT inhaler Commonly known as: VENTOLIN  HFA Inhale 2 puffs into the lungs every 6 (six) hours as needed for wheezing or shortness of breath.   amLODipine  10 MG tablet Commonly known as: NORVASC  Take 1 tablet (10 mg total) by mouth daily. Start taking on: April 15, 2023 What changed:  medication strength how much to take   aspirin  EC 81 MG tablet Take 1 tablet (81 mg total) by mouth daily. Swallow whole. Start taking on: April 15, 2023   atorvastatin  80 MG tablet Commonly known as: LIPITOR  TAKE 1 TABLET EVERY DAY AT 6 PM What changed: See the new instructions.   B-12 1000 MCG Caps Take 1,000 mcg by mouth daily.   buPROPion  300 MG 24 hr tablet Commonly known as: WELLBUTRIN  XL Take 300 mg by mouth daily.   carvedilol  12.5 MG tablet Commonly known as: COREG  TAKE 1 TABLET (12.5MG  TOTAL) BY MOUTH TWICE A DAY WITH MEALS   ciprofloxacin  250 MG tablet Commonly known as: CIPRO  Take 1 tablet (250 mg total) by mouth 2 (two) times daily for 3 days.   cyclobenzaprine  10 MG tablet Commonly known as: FLEXERIL  Take 10 mg by mouth 3 (three) times daily as needed.   diclofenac  sodium 1 % Gel Commonly known as: VOLTAREN  Apply 4 g topically 4 (four) times daily.   fluticasone 50 MCG/ACT nasal spray Commonly known as: FLONASE Place 2 sprays into both nostrils as needed.   furosemide  20 MG tablet Commonly known as: LASIX  Take 1 tablet (20 mg total) by mouth daily.   gabapentin  300 MG capsule Commonly known as: NEURONTIN  Take  300 mg by mouth 2 (two) times daily.   Lantus  SoloStar 100 UNIT/ML Solostar Pen Generic drug: insulin  glargine Inject 50 Units into the skin in the morning and at bedtime.   Linzess 145 MCG Caps capsule Generic drug: linaclotide Take 145 mcg by mouth daily.   losartan  25 MG tablet Commonly known as: COZAAR  Take 1 tablet (25 mg total) by mouth daily.   metFORMIN  1000 MG tablet Commonly known as: GLUCOPHAGE  Take 1,000 mg by mouth 2 (two) times daily.   Movantik  25 MG Tabs tablet Generic drug: naloxegol  oxalate Take 25 mg by mouth daily before breakfast.   naloxone  4 MG/0.1ML Liqd nasal spray kit Commonly known as: NARCAN  Place 0.4 mg into the nose once.   nitroGLYCERIN  0.4 MG SL tablet Commonly known as: NITROSTAT  Place 1 tablet (0.4 mg total) under the tongue every 5 (five) minutes x 3 doses as needed for chest pain.   oxyCODONE -acetaminophen  10-325 MG tablet Commonly known as: PERCOCET Take 1 tablet by mouth 4 (four) times daily as needed.   polyethylene glycol 17 g packet Commonly known as: MIRALAX  / GLYCOLAX  Take 17 g by mouth daily as needed.   potassium chloride  10 MEQ tablet Commonly known as: KLOR-CON  Take 1 tablet (10 mEq total) by mouth daily. What changed:  when to take this reasons to take this   ticagrelor  90 MG Tabs tablet Commonly known as: BRILINTA  Take 1 tablet (90 mg total) by mouth 2 (two) times daily.  traZODone  50 MG tablet Commonly known as: DESYREL  Take 50 mg by mouth at bedtime as needed for sleep.          Outstanding Labs/Studies   BMET at follow up  Duration of Discharge Encounter   Greater than 30 minutes including physician time.  Signed, Manuelita Rummer, NP 04/14/2023, 11:43 AM

## 2023-04-14 NOTE — Telephone Encounter (Signed)
 Called and spoke to patient and made her aware that she has a follow up appt on 04/21/2023 with  a provider at  [facility]. Patient verbalizes an understanding.

## 2023-04-14 NOTE — Care Management Important Message (Signed)
 Important Message  Patient Details  Name: Miranda [name] MRN: [id number] Date of Birth: 01/24/1956   Important Message Given:  Yes - Medicare IM     Renie Ora 04/14/2023, 10:37 AM

## 2023-04-14 NOTE — Plan of Care (Signed)
  Problem: Education: Goal: Understanding of cardiac disease, CV risk reduction, and recovery process will improve Outcome: Progressing Goal: Individualized Educational Video(s) Outcome: Progressing   Problem: Activity: Goal: Ability to tolerate increased activity will improve Outcome: Progressing   Problem: Cardiac: Goal: Ability to achieve and maintain adequate cardiovascular perfusion will improve Outcome: Progressing   Problem: Health Behavior/Discharge Planning: Goal: Ability to safely manage health-related needs after discharge will improve Outcome: Progressing   Problem: Education: Goal: Knowledge of General Education information will improve Description: Including pain rating scale, medication(s)/side effects and non-pharmacologic comfort measures Outcome: Progressing   Problem: Health Behavior/Discharge Planning: Goal: Ability to manage health-related needs will improve Outcome: Progressing   Problem: Clinical Measurements: Goal: Ability to maintain clinical measurements within normal limits will improve Outcome: Progressing Goal: Will remain free from infection Outcome: Progressing Goal: Diagnostic test results will improve Outcome: Progressing Goal: Respiratory complications will improve Outcome: Progressing Goal: Cardiovascular complication will be avoided Outcome: Progressing   Problem: Activity: Goal: Risk for activity intolerance will decrease Outcome: Progressing   Problem: Nutrition: Goal: Adequate nutrition will be maintained Outcome: Progressing   Problem: Coping: Goal: Level of anxiety will decrease Outcome: Progressing   Problem: Elimination: Goal: Will not experience complications related to bowel motility Outcome: Progressing Goal: Will not experience complications related to urinary retention Outcome: Progressing   Problem: Pain Management: Goal: General experience of comfort will improve Outcome: Progressing   Problem:  Safety: Goal: Ability to remain free from injury will improve Outcome: Progressing   Problem: Skin Integrity: Goal: Risk for impaired skin integrity will decrease Outcome: Progressing   Problem: Education: Goal: Ability to describe self-care measures that may prevent or decrease complications (Diabetes Survival Skills Education) will improve Outcome: Progressing Goal: Individualized Educational Video(s) Outcome: Progressing   Problem: Coping: Goal: Ability to adjust to condition or change in health will improve Outcome: Progressing   Problem: Fluid Volume: Goal: Ability to maintain a balanced intake and output will improve Outcome: Progressing   Problem: Health Behavior/Discharge Planning: Goal: Ability to identify and utilize available resources and services will improve Outcome: Progressing Goal: Ability to manage health-related needs will improve Outcome: Progressing   Problem: Metabolic: Goal: Ability to maintain appropriate glucose levels will improve Outcome: Progressing   Problem: Nutritional: Goal: Maintenance of adequate nutrition will improve Outcome: Progressing Goal: Progress toward achieving an optimal weight will improve Outcome: Progressing   Problem: Skin Integrity: Goal: Risk for impaired skin integrity will decrease Outcome: Progressing   Problem: Tissue Perfusion: Goal: Adequacy of tissue perfusion will improve Outcome: Progressing   Problem: Education: Goal: Understanding of CV disease, CV risk reduction, and recovery process will improve Outcome: Progressing Goal: Individualized Educational Video(s) Outcome: Progressing   Problem: Activity: Goal: Ability to return to baseline activity level will improve Outcome: Progressing   Problem: Cardiovascular: Goal: Ability to achieve and maintain adequate cardiovascular perfusion will improve Outcome: Progressing Goal: Vascular access site(s) Level 0-1 will be maintained Outcome: Progressing    Problem: Health Behavior/Discharge Planning: Goal: Ability to safely manage health-related needs after discharge will improve Outcome: Progressing

## 2023-04-14 NOTE — Progress Notes (Addendum)
 CARDIAC REHAB PHASE I   PRE:  Rate/Rhythm: Sinus 74  BP:   Sitting: 125/71 large cuff     SaO2: 97% Room Air  MODE:  Ambulation: 120 ft   POST:  Rate/Rhythem: Sinus with PVC's 81  BP:    Sitting: 120/71     SaO2: 94% RA  0945-1100  Patient had taken off monitor. Assisted patient to put monitor and clothes back on. Reviewed exercise guidelines.Temperature precautions. RPE scale. End points for exercise. Patient does not do a lot of walking at home. Uses a 4 prod cane at home.Patient used rolling walker walked to nurses station. Complained of feeling slightly lightheaded and reported having lower leg pain. Stopped to rest. Assisted back to bed after using the bathroom with call bell within reach. Patient's RN notified about symptoms.  Ms Reisch says she is interested in participating in outpatient cardiac rehab in [place]. Discussed heart healthy diabetic diet with the patient. Given stent card. Reviewed MI booklet. Encouraged patient to quit smoking. Given handout and the phone number to the [place] quit line. Talked to Ms Shoaff about the importance of taking her Brillinta twice a day, aspirin  and other medications as prescribed. Talked with the patient about the use of sublingual nitroglycerin  and when to call 911.  Hadassah Elpidio Quan RN

## 2023-04-14 NOTE — Telephone Encounter (Signed)
   Transition of Care Follow-up Phone Call Request    Patient Name: Miranda [name] Date of Birth: 1955-06-29 Date of Encounter: 04/14/2023  Primary Care Provider:  Shelda Atlas, MD Primary Cardiologist:  Dorn Lesches, MD  Miranda [name] has been scheduled for a transition of care follow up appointment with a [facility] provider:  Lamarr Satterfield 1/7  Please reach out to Miranda [name] within 48 hours of discharge to confirm appointment and review transition of care protocol questionnaire. Anticipated discharge date: 12/31  Manuelita Rummer, NP  04/14/2023, 8:59 AM

## 2023-04-14 NOTE — Telephone Encounter (Signed)
Patient Product/process development scientist completed.    The patient is insured through [employer]. Patient has Medicare and is not eligible for a copay card, but may be able to apply for patient assistance, if available.    Ran test claim for Brilinta 90 mg and the current 30 day co-pay is $0.00.   This test claim was processed through [facility]- copay amounts may vary at other pharmacies due to pharmacy/plan contracts, or as the patient moves through the different stages of their insurance plan.     Roland Earl, CPHT Pharmacy Technician III Certified Patient Advocate [facility] Patient Advocate Team Direct Number: [phone]  Fax: [phone]

## 2023-04-20 NOTE — Progress Notes (Deleted)
 Cardiology Office Note:  .   Date:  04/20/2023  ID:  [name], DOB [birth year], MRN [id number] PCP: Miranda Atlas, MD  [facility] Providers Cardiologist:  Dorn Lesches, MD { }   History of Present Illness: .   Miranda [name] is a 68 y.o. female with history of CAD, hx of DES to Cx to LAD in 2017, DM, PAD, with occlusion of the mid right SFA and high-grade stenosis of the mid left SFA. She was admitted with NSTEMI,with LHC with severe stenosis of proximal LAD/1st diagonal.bifurcation PCI/DES X1 with continued patency of proximal Cx stent.   ROS: ***  Studies Reviewed: .     Cath: 04/13/2023   1.  Severe stenosis of the proximal LAD/first diagonal bifurcation treated successfully with PCI using a provisional approach, stenting of the LAD and balloon angioplasty of the diagonal (3.0 x 16 mm Synergy DES in the LAD) 2.  Continued patency of the proximal circumflex stent (left dominant circumflex) 3.  Patency of the nondominant RCA 4.  Moderately elevated LVEDP   Recommendations: Aggressive risk reduction measures, DAPT with aspirin  and ticagrelor  at least 12 months without interruption, discharge tomorrow if no early complications arise   Diagnostic Dominance: Left  Intervention     Echo: 04/11/2023  1. Left ventricular ejection fraction, by estimation, is >75%. Left  ventricular ejection fraction by PLAX is 76 %. The left ventricle has  hyperdynamic function. The left ventricle has no regional wall motion  abnormalities. There is mild left ventricular   hypertrophy. Left ventricular diastolic parameters are consistent with  Grade I diastolic dysfunction (impaired relaxation). Elevated left  ventricular end-diastolic pressure.   2. Right ventricular systolic function is normal. The right ventricular  size is normal. Tricuspid regurgitation signal is inadequate for assessing  PA pressure.   3. The mitral valve is abnormal. Trivial mitral valve regurgitation.   Moderate mitral annular calcification.   4. The aortic valve is tricuspid. Aortic valve regurgitation is not  visualized.   5. The inferior vena cava is normal in size with greater than 50%  respiratory variability, suggesting right atrial pressure of 3 mmHg.   *** EKG Interpretation Date/Time:    Ventricular Rate:    PR Interval:    QRS Duration:    QT Interval:    QTC Calculation:   R Axis:      Text Interpretation:      Physical Exam:   VS:  There were no vitals taken for this visit.   Wt Readings from Last 3 Encounters:  04/12/23 229 lb 15 oz (104.3 kg)  08/27/22 220 lb (99.8 kg)  08/01/22 228 lb 6.4 oz (103.6 kg)    GEN: Well nourished, well developed in no acute distress NECK: No JVD; No carotid bruits CARDIAC: ***RRR, no murmurs, rubs, gallops RESPIRATORY:  Clear to auscultation without rales, wheezing or rhonchi  ABDOMEN: Soft, non-tender, non-distended EXTREMITIES:  No edema; No deformity   ASSESSMENT AND PLAN: .   *** {The patient has an active order for outpatient cardiac rehabilitation.   Please indicate if the patient is ready to start. Do NOT delete this.  It will auto delete.  Refresh note, then sign.              Click here to document readiness and see contraindications.  :1}  Cardiac Rehabilitation Eligibility Assessment      {Are you ordering a CV Procedure (e.g. stress test, cath, DCCV, TEE, etc)?   Press F2        :  [id number]}    Bonney Lamarr HERO. Jerilynn CHOL, ANP, AACC

## 2023-04-21 ENCOUNTER — Ambulatory Visit: Payer: 59 | Attending: Adult Health | Admitting: Adult Health

## 2023-04-22 ENCOUNTER — Other Ambulatory Visit: Payer: Self-pay

## 2023-04-22 ENCOUNTER — Telehealth (HOSPITAL_COMMUNITY): Payer: Self-pay

## 2023-04-22 MED ORDER — ATORVASTATIN CALCIUM 80 MG PO TABS: 80.0000 mg | ORAL_TABLET | Freq: Every day | ORAL | 0 refills | Status: DC

## 2023-04-22 NOTE — Telephone Encounter (Signed)
 Attempted to call patient in regards to Cardiac Rehab - LM on VM

## 2023-05-01 ENCOUNTER — Other Ambulatory Visit: Payer: 59

## 2023-05-20 ENCOUNTER — Other Ambulatory Visit: Payer: Self-pay | Admitting: Cardiovascular Disease

## 2023-05-20 ENCOUNTER — Ambulatory Visit: Payer: 59 | Admitting: Cardiovascular Disease

## 2023-05-20 MED ORDER — ATORVASTATIN CALCIUM 80 MG PO TABS: 80.0000 mg | ORAL_TABLET | Freq: Every day | ORAL | 0 refills | Status: DC

## 2023-05-21 ENCOUNTER — Other Ambulatory Visit: Payer: Self-pay | Admitting: Adult Health

## 2023-06-15 ENCOUNTER — Other Ambulatory Visit: Payer: Self-pay

## 2023-06-15 ENCOUNTER — Ambulatory Visit (HOSPITAL_COMMUNITY)
Admission: EM | Admit: 2023-06-15 | Discharge: 2023-06-15 | Disposition: A | Discharge status: Home / Self Care | Attending: Family Medicine | Admitting: Family Medicine

## 2023-06-15 ENCOUNTER — Encounter (HOSPITAL_COMMUNITY): Payer: Self-pay | Admitting: *Deleted

## 2023-06-15 ENCOUNTER — Ambulatory Visit
Admission: RE | Admit: 2023-06-15 | Discharge: 2023-06-15 | Disposition: A | Discharge status: Home / Self Care | Payer: 59 | Source: Ambulatory Visit | Attending: Physical Medicine and Rehabilitation | Admitting: Physical Medicine and Rehabilitation

## 2023-06-15 DIAGNOSIS — E1165 Type 2 diabetes mellitus with hyperglycemia: Secondary | ICD-10-CM | POA: Insufficient documentation

## 2023-06-15 DIAGNOSIS — M545 Low back pain, unspecified: Secondary | ICD-10-CM

## 2023-06-15 DIAGNOSIS — N760 Acute vaginitis: Secondary | ICD-10-CM | POA: Insufficient documentation

## 2023-06-15 LAB — POCT URINALYSIS DIP (MANUAL ENTRY)
Bilirubin, UA: NEGATIVE
Blood, UA: NEGATIVE
Glucose, UA: >=1000 mg/dL — AB
Leukocytes, UA: NEGATIVE
Nitrite, UA: NEGATIVE
Protein Ur, POC: 30 mg/dL — AB
Spec Grav, UA: 1.015 (ref 1.010–1.025)
Urobilinogen, UA: 0.2 U/dL
pH, UA: 5.5 (ref 5.0–8.0)

## 2023-06-15 LAB — POCT FASTING CBG KUC MANUAL ENTRY: POCT Glucose (KUC): 461 mg/dL — AB (ref 70–99)

## 2023-06-15 MED ORDER — FLUCONAZOLE 150 MG PO TABS: ORAL_TABLET | ORAL | 0 refills | Status: DC

## 2023-06-15 NOTE — Discharge Instructions (Signed)
  PLEASE CALL PCP TOMORROW MORNING REGARDING ELEVATED GLUCOSE, METER, ETC.

## 2023-06-15 NOTE — ED Triage Notes (Signed)
 Pt reports her blood sugars are staying up( Pt has not checked her BS because she lost her meter for several days.) Pt reports she has a yeast infection because her BS have been high.

## 2023-06-16 ENCOUNTER — Telehealth (HOSPITAL_BASED_OUTPATIENT_CLINIC_OR_DEPARTMENT_OTHER): Payer: Self-pay

## 2023-06-16 LAB — CERVICOVAGINAL ANCILLARY ONLY
Bacterial Vaginitis (gardnerella): POSITIVE — AB
Candida Glabrata: POSITIVE — AB
Candida Vaginitis: POSITIVE — AB
Comment: NEGATIVE
Comment: NEGATIVE
Comment: NEGATIVE

## 2023-06-16 MED ORDER — METRONIDAZOLE 500 MG PO TABS: 500.0000 mg | ORAL_TABLET | Freq: Two times a day (BID) | ORAL | 0 refills | Status: AC

## 2023-06-16 NOTE — Telephone Encounter (Signed)
 Per protocol, pt requires tx with metronidazole. Rx sent to pharmacy on file.

## 2023-06-18 NOTE — ED Provider Notes (Addendum)
 [facility]    CSN: [id number] Arrival date & time: 06/15/23  1659      History   Chief Complaint Chief Complaint  Patient presents with   Vaginal Discharge   Dysuria    HPI Miranda [name] is a 68 y.o. female.   Patient reports to urgent care for evaluation of suspected yeast infection.  Miranda [name] reports that Miranda [name] has had vaginal discharge for the last several days and notes Miranda [name] has had elevated blood sugars.  Miranda [name] is a known diabetic but lost her glucometer.  Miranda [name] does not report any concerns for STDs.  The history is provided by the patient.  Vaginal Discharge Associated symptoms: dysuria   Associated symptoms: no abdominal pain, no fever, no nausea and no vomiting   Dysuria Associated symptoms: vaginal discharge   Associated symptoms: no abdominal pain, no fever, no nausea and no vomiting     Past Medical History:  Diagnosis Date   Acid reflux    Anxiety    Asthma    CAD (coronary artery disease)    Stent to the circumflex 2017.   Degenerative disc disease    Depression    Diabetes mellitus    Hypertension    Hypoglycemia 12/30/2016   PVD (peripheral vascular disease) (HCC)    Stroke (HCC)    04/22/12    Patient Active Problem List   Diagnosis Date Noted   UTI (urinary tract infection) 04/14/2023   Peripheral arterial disease (HCC) 02/18/2022   Unilateral primary osteoarthritis, right knee 03/27/2021   Unilateral primary osteoarthritis, left knee 03/27/2021   Acute right-sided low back pain without sciatica    Peripheral neuropathy 02/08/2018   Acute pancreatitis 02/07/2018   CAD S/P percutaneous coronary angioplasty 01/29/2017   Tobacco abuse 01/29/2017   Chronic back pain 01/29/2017   Hypoglycemia 12/30/2016   Insulin dependent diabetes mellitus 11/21/2015   Dyslipidemia 11/21/2015   NSTEMI (non-ST elevated myocardial infarction) [facility])    TIA (transient ischemic attack) 05/31/2014   History of stroke 04/22/2012   Hypokalemia 04/22/2012    Diabetes mellitus (HCC) 04/22/2012   Essential hypertension 04/22/2012    Past Surgical History:  Procedure Laterality Date   CARDIAC CATHETERIZATION N/A 11/21/2015   Procedure: Left Heart Cath and Coronary Angiography;  Surgeon: Peter M Swaziland, MD;  Location[facility] INVASIVE CV LAB;  Service: Cardiovascular;  Laterality: N/A;   CARDIAC CATHETERIZATION N/A 11/21/2015   Procedure: Coronary Stent Intervention;  Surgeon: Peter M Swaziland, MD;  Location: [facility] INVASIVE CV LAB;  Service: Cardiovascular;  Laterality: N/A;   CORONARY BALLOON ANGIOPLASTY N/A 04/13/2023   Procedure: CORONARY BALLOON ANGIOPLASTY;  Surgeon: Tonny Bollman, MD;  Location: [facility] INVASIVE CV LAB;  Service: Cardiovascular;  Laterality: N/A;   CORONARY STENT INTERVENTION N/A 04/13/2023   Procedure: CORONARY STENT INTERVENTION;  Surgeon: Tonny Bollman, MD;  Location: [facility] INVASIVE CV LAB;  Service: Cardiovascular;  Laterality: N/A;   KNEE SURGERY     LEFT HEART CATH AND CORONARY ANGIOGRAPHY N/A 04/13/2023   Procedure: LEFT HEART CATH AND CORONARY ANGIOGRAPHY;  Surgeon: Tonny Bollman, MD;  Location: [facility] INVASIVE CV LAB;  Service: Cardiovascular;  Laterality: N/A;   TUBAL LIGATION      OB History   No obstetric history on file.      Home Medications    Prior to Admission medications   Medication Sig Start Date End Date Taking? Authorizing Provider  amLODipine (NORVASC) 10 MG tablet Take 1 tablet (10 mg total) by mouth daily. 04/15/23  Yes Su Hilt,  Ernest Haber, NP  aspirin EC 81 MG tablet Take 1 tablet (81 mg total) by mouth daily. Swallow whole. 04/15/23  Yes Arty Baumgartner, NP  atorvastatin (LIPITOR) 80 MG tablet Take 1 tablet (80 mg total) by mouth daily at 6 PM. 05/20/23  Yes [name], MD  buPROPion (WELLBUTRIN XL) 300 MG 24 hr tablet Take 300 mg by mouth daily. 03/19/23  Yes [provider]  carvedilol (COREG) 12.5 MG tablet TAKE 1 TABLET (12.5MG  TOTAL) BY MOUTH TWICE A DAY WITH MEALS 08/11/22  Yes [name], MD  Cyanocobalamin (B-12) 1000 MCG CAPS Take 1,000 mcg by mouth daily.    Yes [provider]  fluconazole (DIFLUCAN) 150 MG tablet Take one tab PO today and repeat dose in 3 days if symptoms persist 06/15/23  Yes Tomi Bamberger, PA-C  furosemide (LASIX) 20 MG tablet Take 1 tablet (20 mg total) by mouth daily. 09/05/19  Yes Lorre Nick, MD  gabapentin (NEURONTIN) 300 MG capsule Take 300 mg by mouth 2 (two) times daily.   Yes [provider]  LANTUS SOLOSTAR 100 UNIT/ML Solostar Pen Inject 50 Units into the skin in the morning and at bedtime. 08/15/19  Yes [provider]  losartan (COZAAR) 25 MG tablet Take 1 tablet (25 mg total) by mouth daily. 04/14/23  Yes Arty Baumgartner, NP  oxyCODONE-acetaminophen (PERCOCET) 10-325 MG tablet Take 1 tablet by mouth 4 (four) times daily as needed. 04/03/23  Yes [provider]  traZODone (DESYREL) 50 MG tablet Take 50 mg by mouth at bedtime as needed for sleep.    Yes [provider]  albuterol (PROVENTIL HFA;VENTOLIN HFA) 108 (90 Base) MCG/ACT inhaler Inhale 2 puffs into the lungs every 6 (six) hours as needed for wheezing or shortness of breath. Patient not taking: Reported on 04/11/2023    [provider]  diclofenac sodium (VOLTAREN) 1 % GEL Apply 4 g topically 4 (four) times daily. 02/11/18   Calvert Cantor, MD  LINZESS 145 MCG CAPS capsule Take 145 mcg by mouth daily. 01/16/23   [provider]  metroNIDAZOLE (FLAGYL) 500 MG tablet Take 1 tablet (500 mg total) by mouth 2 (two) times daily for 7 days. 06/16/23 06/23/23  Zenia Resides, MD  MOVANTIK 25 MG TABS tablet Take 25 mg by mouth daily before breakfast.  12/12/16   [provider]  naloxone [facility]) nasal spray 4 mg/0.1 mL Place 0.4 mg into the nose once. 01/29/23   [provider]  nitroGLYCERIN (NITROSTAT) 0.4 MG SL tablet Place 1 tablet (0.4 mg total) under the tongue every 5 (five) minutes x 3 doses as needed for  chest pain. 04/14/23   Arty Baumgartner, NP  polyethylene glycol (MIRALAX / GLYCOLAX) packet Take 17 g by mouth daily as needed. 02/11/18   Calvert Cantor, MD  potassium chloride (KLOR-CON) 10 MEQ tablet Take 1 tablet (10 mEq total) by mouth daily. Patient taking differently: Take 10 mEq by mouth as needed. 09/05/19   Lorre Nick, MD  ticagrelor (BRILINTA) 90 MG TABS tablet Take 1 tablet (90 mg total) by mouth 2 (two) times daily. 04/14/23   Arty Baumgartner, NP    Family History Family History  Problem Relation Age of Onset   Hypertension Other     Social History Social History   Tobacco Use   Smoking status: Every Day    Current packs/day: 0.25    Average packs/day: 0.3 packs/day for 45.0 years (11.3 ttl pk-yrs)  Types: Cigarettes   Smokeless tobacco: Never  Vaping Use   Vaping status: Never Used  Substance Use Topics   Alcohol use: No   Drug use: No     Allergies   Patient has no known allergies.   Review of Systems Review of Systems  Constitutional:  Negative for chills and fever.  Eyes:  Negative for discharge and redness.  Respiratory:  Negative for shortness of breath.   Gastrointestinal:  Negative for abdominal pain, nausea and vomiting.  Genitourinary:  Positive for dysuria and vaginal discharge.     Physical Exam Triage Vital Signs ED Triage Vitals  Encounter Vitals Group     BP 06/15/23 1813 138/82     Systolic BP Percentile --      Diastolic BP Percentile --      Pulse Rate 06/15/23 1813 99     Resp 06/15/23 1813 20     Temp 06/15/23 1813 98 F (36.7 C)     Temp src --      SpO2 06/15/23 1813 93 %     Weight --      Height --      Head Circumference --      Peak Flow --      Pain Score 06/15/23 1808 3     Pain Loc --      Pain Education --      Exclude from Growth Chart --    No data found.  Updated Vital Signs BP 138/82   Pulse 99   Temp 98 F (36.7 C)   Resp 20   SpO2 93%   Visual Acuity Right Eye Distance:   Left Eye  Distance:   Bilateral Distance:    Right Eye Near:   Left Eye Near:    Bilateral Near:     Physical Exam Vitals and nursing note reviewed.  Constitutional:      General: Miranda [name] is not in acute distress.    Appearance: Normal appearance. Miranda [name] is not ill-appearing.  HENT:     Head: Normocephalic and atraumatic.  Eyes:     Conjunctiva/sclera: Conjunctivae normal.  Cardiovascular:     Rate and Rhythm: Normal rate.  Pulmonary:     Effort: Pulmonary effort is normal. No respiratory distress.  Neurological:     Mental Status: Miranda [name] is alert.  Psychiatric:        Mood and Affect: Mood normal.        Behavior: Behavior normal.        Thought Content: Thought content normal.      UC Treatments / Results  Labs (all labs ordered are listed, but only abnormal results are displayed) Labs Reviewed  POCT FASTING CBG [facility] MANUAL ENTRY - Abnormal; Notable for the following components:      Result Value   POCT Glucose ([facility]) 461 (*)    All other components within normal limits  POCT URINALYSIS DIP (MANUAL ENTRY) - Abnormal; Notable for the following components:   Glucose, UA >=1,000 (*)    Ketones, POC UA trace (5) (*)    Protein Ur, POC =30 (*)    All other components within normal limits  CERVICOVAGINAL ANCILLARY ONLY - Abnormal; Notable for the following components:   Bacterial Vaginitis (gardnerella) Positive (*)    Candida Vaginitis Positive (*)    Candida Glabrata Positive (*)    All other components within normal limits    EKG   Radiology No results found.  Procedures Procedures (including critical care time)  Medications Ordered in UC Medications - No data to display  Initial Impression / Assessment and Plan / UC Course  I have reviewed the triage vital signs and the nursing notes.  Pertinent labs & imaging results that were available during my care of the patient were reviewed by me and considered in my medical decision making (see chart for details).    UA without  sign of UTI.  Will treat to cover yeast and screening ordered for same.  Discussed significantly elevated glucose in office, patient is currently asymptomatic but recommended further evaluation by her primary care doctor tomorrow.  Recommend emergency room evaluation with any further concerns given hyperglycemia.  Final Clinical Impressions(s) / UC Diagnoses   Final diagnoses:  Acute vaginitis  Type 2 diabetes mellitus with hyperglycemia, without long-term current use of insulin (HCC)     Discharge Instructions       PLEASE CALL PCP TOMORROW MORNING REGARDING ELEVATED GLUCOSE, METER, ETC.      ED Prescriptions     Medication Sig Dispense Auth. Provider   fluconazole (DIFLUCAN) 150 MG tablet Take one tab PO today and repeat dose in 3 days if symptoms persist 2 tablet Tomi Bamberger, PA-C      PDMP not reviewed this encounter.   Tomi Bamberger, PA-C 06/18/23 0929    Tomi Bamberger, PA-C 06/18/23 [phone]

## 2023-06-26 ENCOUNTER — Ambulatory Visit: Payer: 59 | Admitting: Cardiovascular Disease

## 2023-06-29 ENCOUNTER — Encounter: Payer: Self-pay | Admitting: Cardiovascular Disease

## 2023-07-10 ENCOUNTER — Encounter: Payer: Self-pay | Admitting: Physical Medicine and Rehabilitation

## 2023-07-10 ENCOUNTER — Ambulatory Visit (INDEPENDENT_AMBULATORY_CARE_PROVIDER_SITE_OTHER): Admitting: Physical Medicine and Rehabilitation

## 2023-07-10 DIAGNOSIS — M47816 Spondylosis without myelopathy or radiculopathy, lumbar region: Secondary | ICD-10-CM | POA: Diagnosis not present

## 2023-07-10 DIAGNOSIS — M7918 Myalgia, other site: Secondary | ICD-10-CM

## 2023-07-10 DIAGNOSIS — R269 Unspecified abnormalities of gait and mobility: Secondary | ICD-10-CM | POA: Diagnosis not present

## 2023-07-10 DIAGNOSIS — M545 Low back pain, unspecified: Secondary | ICD-10-CM | POA: Diagnosis not present

## 2023-07-10 DIAGNOSIS — G8929 Other chronic pain: Secondary | ICD-10-CM

## 2023-07-10 NOTE — Progress Notes (Signed)
 Miranda [name] - 68 y.o. female MRN [id number]  Date of birth: [birth year]  Office Visit Note: Visit Date: 07/10/2023 PCP: [name], MD Referred by: [name], MD  Subjective: Chief Complaint  Patient presents with   Lower Back - Pain   HPI: Miranda [name] is a 68 y.o. female who comes in today for evaluation of chronic, worsening and severe bilateral lower back pain. Pain ongoing for several years, worsens with standing and activity. She describes pain as sharp and sore, currently rates as 10 out 10. Patient reports some relief of pain with home exercise regimen, rest and use of medications. Patient currently taking Percocet prescribed by Merryl Hacker, NP at [facility]. Patient also reports use of Voltaren gel as needed. She is taking Gabapentin and was also prescribed Lyrica. No history of formal physical therapy. Recent lumbar MRI imaging shows multi level spondylosis, no significant spinal stenosis or nerve root encroachment. Moderate facet hypertrophy. Patient underwent bilateral L5-S1 radiofrequency ablation in our office on 12/18/2022. No relief of pain with this procedure. Patient denies focal weakness, numbness and tingling. No recent trauma or falls. She is using cane to assist with ambulation.  Patients course is complicated by TIA, hypertension, STEMI, diabetes mellitus, tobacco abuse and peripheral neuropathy.        Review of Systems  Musculoskeletal:  Positive for back pain and myalgias.  Neurological:  Negative for tingling, sensory change, focal weakness and weakness.  All other systems reviewed and are negative.  Otherwise per HPI.  Assessment & Plan: Visit Diagnoses:    ICD-10-CM   1. Chronic bilateral low back pain without sciatica  M54.50 Ambulatory referral to Physical Therapy   G89.29     2. Facet arthropathy, lumbar  M47.816 Ambulatory referral to Physical Therapy    3. Myofascial pain syndrome  M79.18 Ambulatory referral to  Physical Therapy    4. Gait abnormality  R26.9 Ambulatory referral to Physical Therapy       Plan: Findings:  Chronic recalcitrant lower back pain in the setting of chronic pain. No radicular symptoms down the legs. Patient continues to have severe pain despite good conservative therapies such as home exercise regimen, rest and use of medications. Patients clinical presentation and exam are complex, differentials includes facet mediated pain and myofascial pain syndrome. Recent lumbar MRI is essentially unchanged from 2018. We discussed treatment plan in detail today. Prior lumbar radiofrequency ablation was not successful in alleviating her pain. At this point, we do not recommend continuing with interventional spine procedures. I do not feel patient would be ideal surgical candidate given her complex cardiac and medical history. I also do not feel she would be good candidate for spinal cord stimulation. Would recommend she follow up with [facility] for continued chronic pain management. I also placed order for short course of formal physical therapy. Patient has no questions at this time. No red flag symptoms noted upon exam today.     Meds & Orders: No orders of the defined types were placed in this encounter.   Orders Placed This Encounter  Procedures   Ambulatory referral to Physical Therapy    Follow-up: Return if symptoms worsen or fail to improve.   Procedures: No procedures performed      Clinical History: CLINICAL DATA:  Worsening low back pain. No relief from prior RFA. Bilateral leg pain, weakness and numbness for 6 weeks.   EXAM: MRI LUMBAR SPINE WITHOUT CONTRAST   TECHNIQUE: Multiplanar, multisequence MR imaging  of the lumbar spine was performed. No intravenous contrast was administered.   COMPARISON:  Lumbar MRI 01/24/2017.  Radiographs 10/11/2014.   FINDINGS: Segmentation: Conventional anatomy assumed, with the last open disc space designated  L5-S1.Concordant with prior imaging.   Alignment:  Physiologic.   Vertebrae: No worrisome osseous lesion, acute fracture or pars defect. The visualized sacroiliac joints appear unremarkable.   Conus medullaris: Extends to the L1 level. The conus and cauda equina appear normal.   Paraspinal and other soft tissues: No significant paraspinal findings.   Disc levels:   Sagittal images demonstrate no significant disc space findings within the visualized lower thoracic spine. There is mild disc bulging at T11-12 without resulting spinal stenosis or nerve root encroachment.   L1-2: Mildly progressive disc desiccation with mild loss of disc height and annular disc bulging. No resulting spinal stenosis, foraminal narrowing or nerve root encroachment.   L2-3: Mild disc desiccation and bulging with a small right foraminal annular fissure. No focal disc protrusion, spinal stenosis or nerve root encroachment.   L3-4: Mild disc desiccation and bulging with a small disc protrusion in the left subarticular zone. Mild facet hypertrophy. There is mild narrowing of the left lateral recess and left foramen without definite nerve root encroachment. No spinal stenosis.   L4-5: Stable mild disc bulging, facet and ligamentous hypertrophy. No significant spinal stenosis. Stable mild left foraminal narrowing without definite nerve root encroachment.   L5-S1: Disc height and hydration are preserved. Moderate facet hypertrophy, greater on the right. No spinal stenosis or nerve root encroachment.   IMPRESSION: 1. No acute findings or clear explanation for the patient's symptoms. 2. Mildly progressive multilevel lumbar spondylosis as described. 3. No significant spinal stenosis or nerve root encroachment.     Electronically Signed   By: Miranda [name] M.D.   On: 07/03/2023 19:34   She reports that she has been smoking cigarettes. She has a 11.3 pack-year smoking history. She has never used  smokeless tobacco.  Recent Labs    04/12/23 1042  HGBA1C 8.3*    Objective:  VS:  HT:    WT:   BMI:     BP:   HR: bpm  TEMP: ( )  RESP:  Physical Exam Vitals and nursing note reviewed.  HENT:     Head: Normocephalic and atraumatic.     Right Ear: External ear normal.     Left Ear: External ear normal.     Nose: Nose normal.     Mouth/Throat:     Mouth: Mucous membranes are moist.  Eyes:     Extraocular Movements: Extraocular movements intact.  Cardiovascular:     Rate and Rhythm: Normal rate.     Pulses: Normal pulses.  Pulmonary:     Effort: Pulmonary effort is normal.  Abdominal:     General: Abdomen is flat. There is no distension.  Musculoskeletal:        General: Tenderness present.     Cervical back: Normal range of motion.     Comments: Patient is slow to rise from seated position to standing. Pain noted with facet loading and lumbar extension. 5/5 strength noted with bilateral hip flexion, knee flexion/extension, ankle dorsiflexion/plantarflexion and EHL. No clonus noted bilaterally. No pain upon palpation of greater trochanters. No pain with internal/external rotation of bilateral hips. Sensation intact bilaterally. Negative slump test bilaterally. Ambulates with cane, gait slow and unsteady.     Skin:    General: Skin is warm and dry.  Capillary Refill: Capillary refill takes less than 2 seconds.  Neurological:     General: No focal deficit present.     Mental Status: She is alert and oriented to person, place, and time.  Psychiatric:        Mood and Affect: Mood normal.        Behavior: Behavior normal.     Ortho Exam  Imaging: No results found.  Past Medical/Family/Surgical/Social History: Medications & Allergies reviewed per EMR, new medications updated. Patient Active Problem List   Diagnosis Date Noted   UTI (urinary tract infection) 04/14/2023   Peripheral arterial disease (HCC) 02/18/2022   Unilateral primary osteoarthritis, right knee  03/27/2021   Unilateral primary osteoarthritis, left knee 03/27/2021   Acute right-sided low back pain without sciatica    Peripheral neuropathy 02/08/2018   Acute pancreatitis 02/07/2018   CAD S/P percutaneous coronary angioplasty 01/29/2017   Tobacco abuse 01/29/2017   Chronic back pain 01/29/2017   Hypoglycemia 12/30/2016   Insulin dependent diabetes mellitus 11/21/2015   Dyslipidemia 11/21/2015   NSTEMI (non-ST elevated myocardial infarction) [facility])    TIA (transient ischemic attack) 05/31/2014   History of stroke 04/22/2012   Hypokalemia 04/22/2012   Diabetes mellitus (HCC) 04/22/2012   Essential hypertension 04/22/2012   Past Medical History:  Diagnosis Date   Acid reflux    Anxiety    Asthma    CAD (coronary artery disease)    Stent to the circumflex 2017.   Degenerative disc disease    Depression    Diabetes mellitus    Hypertension    Hypoglycemia 12/30/2016   PVD (peripheral vascular disease) (HCC)    Stroke (HCC)    04/22/12   Family History  Problem Relation Age of Onset   Hypertension Other    Past Surgical History:  Procedure Laterality Date   CARDIAC CATHETERIZATION N/A 11/21/2015   Procedure: Left Heart Cath and Coronary Angiography;  Surgeon: Peter M Swaziland, MD;  Location: [facility] INVASIVE CV LAB;  Service: Cardiovascular;  Laterality: N/A;   CARDIAC CATHETERIZATION N/A 11/21/2015   Procedure: Coronary Stent Intervention;  Surgeon: Peter M Swaziland, MD;  Location: [facility] INVASIVE CV LAB;  Service: Cardiovascular;  Laterality: N/A;   CORONARY BALLOON ANGIOPLASTY N/A 04/13/2023   Procedure: CORONARY BALLOON ANGIOPLASTY;  Surgeon: Tonny Bollman, MD;  Location[facility] INVASIVE CV LAB;  Service: Cardiovascular;  Laterality: N/A;   CORONARY STENT INTERVENTION N/A 04/13/2023   Procedure: CORONARY STENT INTERVENTION;  Surgeon: Tonny Bollman, MD;  Location: [facility] INVASIVE CV LAB;  Service: Cardiovascular;  Laterality: N/A;   KNEE SURGERY     LEFT HEART CATH AND CORONARY  ANGIOGRAPHY N/A 04/13/2023   Procedure: LEFT HEART CATH AND CORONARY ANGIOGRAPHY;  Surgeon: Tonny Bollman, MD;  Location: [facility] INVASIVE CV LAB;  Service: Cardiovascular;  Laterality: N/A;   TUBAL LIGATION     Social History   Occupational History   Not on file  Tobacco Use   Smoking status: Every Day    Current packs/day: 0.25    Average packs/day: 0.3 packs/day for 45.0 years (11.3 ttl pk-yrs)    Types: Cigarettes   Smokeless tobacco: Never  Vaping Use   Vaping status: Never Used  Substance and Sexual Activity   Alcohol use: No   Drug use: No   Sexual activity: Never

## 2023-07-10 NOTE — Progress Notes (Signed)
 Pain Scale   Average Pain 9 MRI Review

## 2023-07-14 ENCOUNTER — Encounter: Payer: Self-pay | Admitting: Cardiovascular Disease

## 2023-07-14 ENCOUNTER — Ambulatory Visit: Attending: Cardiovascular Disease | Admitting: Cardiovascular Disease

## 2023-07-14 VITALS — BP 122/70 | HR 82 | Ht 70.0 in | Wt 228.6 lb

## 2023-07-14 DIAGNOSIS — I739 Peripheral vascular disease, unspecified: Secondary | ICD-10-CM | POA: Diagnosis not present

## 2023-07-14 DIAGNOSIS — E785 Hyperlipidemia, unspecified: Secondary | ICD-10-CM

## 2023-07-14 DIAGNOSIS — I214 Non-ST elevation (NSTEMI) myocardial infarction: Secondary | ICD-10-CM | POA: Diagnosis not present

## 2023-07-14 DIAGNOSIS — I10 Essential (primary) hypertension: Secondary | ICD-10-CM

## 2023-07-14 NOTE — Assessment & Plan Note (Signed)
 History of peripheral arterial disease with Dopplers that have shown a right ABI of 0.54 left of 0.70 with a short segment occlusion mid right SFA and tibial vessel disease as well as high-grade segmental mid left SFA stenosis.  She does complain of leg pain which she attributes to her back and knees.

## 2023-07-14 NOTE — Assessment & Plan Note (Signed)
 History of CAD status post cardiac catheterization 11/21/2015 by Dr. Swaziland revealing a 90% proximal AV groove circumflex disease and 60% distal LAD lesion.  Her circumflex was stented with a drug-eluting stent.  She had a negative Myoview performed 08/13/2021.  She was admitted with a non-STEMI 04/13/2023 with troponins that went up to the 300 range.  She underwent cardiac apposition by Dr. Excell Seltzer revealing a 90% proximal LAD stenosis involving a diagonal branch at a bifurcation.  Her circumflex that was patent.  She underwent provisional stenting of her LAD with ballooning of her diagonal branch ostium.  She had excellent angiographic result in the LAD with some ostial residual disease of her diagonal branch.  She has been asymptomatic since on aspirin and Brilinta.

## 2023-07-14 NOTE — Assessment & Plan Note (Signed)
 History of dyslipidemia on high-dose atorvastatin with lipid profile performed 04/03/2023 revealing total cholesterol 159, LDL 75 and HDL 35.

## 2023-07-14 NOTE — Progress Notes (Signed)
 07/14/2023 Miranda [name]   05-26-1955  [id number]  Primary Physician [name], MD Primary Cardiologist: [name] MD FACP, [place], [place], [place]  HPI:  Miranda [name] is a 68 y.o.    moderately overweight divorced African-American female mother of 3, grandmother of one grandchild who I last saw in the office 02/18/2022... She has a history of treated hypertension, diabetes, hyperlipidemia and tobacco abuse of approximately 20-30 pack years currently smoking 3-5 cigarettes a day. She is disabled because of her back and knees as well as diabetes. She's had a stroke in the past. She was admitted with chest pain and had a minimally elevated troponin. She underwent cardiac catheterization on 11/21/15 by Dr. Swaziland revealing a 90% proximal AV groove circumflex 60% distal LAD lesion. Her circumflex was stented with a drug-eluting stent. She's had no recurrent symptoms.   She had a Myoview stress test performed 08/13/2021 was nonischemic with normal EF.  She is also complained of leg pain.  Is difficult to know whether this is claudication versus multifactorial from problems with her knees and back which she has had for a long time.  She did have lower extremity arterial Doppler studies performed today that showed a right ABI of 0.54 and a left of 0.70.  She had a short segment occlusion mid right SFA with tibial vessel disease and high-grade segmental mid left SFA stenosis.  Since I saw her a year and a half ago she was admitted to the hospital on 04/10/2023 and discharged on New Year's Eve.  She had a non-STEMI.  Her troponins went up to the 250 range.  She had diagnostic coronary angiography by Dr. Excell Seltzer on 04/13/2023 revealing a patent proximal AV groove circumflex stent with high-grade LAD diagonal branch bifurcation disease in the proximal LAD.  She underwent provisional stenting of her LAD.  This was tortuous.  She did have residual ostial diagonal branch disease at the end.  She remains on  DAPT with Brilinta and is asymptomatic.   Current Meds  Medication Sig   amLODipine (NORVASC) 10 MG tablet Take 1 tablet (10 mg total) by mouth daily.   aspirin EC 81 MG tablet Take 1 tablet (81 mg total) by mouth daily. Swallow whole.   atorvastatin (LIPITOR) 80 MG tablet Take 1 tablet (80 mg total) by mouth daily at 6 PM.   buPROPion (WELLBUTRIN XL) 300 MG 24 hr tablet Take 300 mg by mouth daily.   carvedilol (COREG) 12.5 MG tablet TAKE 1 TABLET (12.5MG  TOTAL) BY MOUTH TWICE A DAY WITH MEALS   Cyanocobalamin (B-12) 1000 MCG CAPS Take 1,000 mcg by mouth daily.    diclofenac sodium (VOLTAREN) 1 % GEL Apply 4 g topically 4 (four) times daily.   fluconazole (DIFLUCAN) 150 MG tablet Take one tab PO today and repeat dose in 3 days if symptoms persist   furosemide (LASIX) 20 MG tablet Take 1 tablet (20 mg total) by mouth daily.   gabapentin (NEURONTIN) 300 MG capsule Take 300 mg by mouth 2 (two) times daily.   LANTUS SOLOSTAR 100 UNIT/ML Solostar Pen Inject 50 Units into the skin in the morning and at bedtime.   LINZESS 145 MCG CAPS capsule Take 145 mcg by mouth daily.   losartan (COZAAR) 25 MG tablet Take 1 tablet (25 mg total) by mouth daily.   MOVANTIK 25 MG TABS tablet Take 25 mg by mouth daily before breakfast.    naloxone (NARCAN) nasal spray 4 mg/0.1 mL Place 0.4  mg into the nose once.   nitroGLYCERIN (NITROSTAT) 0.4 MG SL tablet Place 1 tablet (0.4 mg total) under the tongue every 5 (five) minutes x 3 doses as needed for chest pain.   oxyCODONE-acetaminophen (PERCOCET) 10-325 MG tablet Take 1 tablet by mouth 4 (four) times daily as needed.   polyethylene glycol (MIRALAX / GLYCOLAX) packet Take 17 g by mouth daily as needed.   potassium chloride (KLOR-CON) 10 MEQ tablet Take 1 tablet (10 mEq total) by mouth daily. (Patient taking differently: Take 10 mEq by mouth as needed.)   ticagrelor (BRILINTA) 90 MG TABS tablet Take 1 tablet (90 mg total) by mouth 2 (two) times daily.   traZODone  (DESYREL) 50 MG tablet Take 50 mg by mouth at bedtime as needed for sleep.      No Known Allergies  Social History   Socioeconomic History   Marital status: Divorced    Spouse name: Not on file   Number of children: Not on file   Years of education: Not on file   Highest education level: Not on file  Occupational History   Not on file  Tobacco Use   Smoking status: Every Day    Current packs/day: 0.25    Average packs/day: 0.3 packs/day for 45.0 years (11.3 ttl pk-yrs)    Types: Cigarettes   Smokeless tobacco: Never  Vaping Use   Vaping status: Never Used  Substance and Sexual Activity   Alcohol use: No   Drug use: No   Sexual activity: Never  Other Topics Concern   Not on file  Social History Narrative   Not on file   Social Drivers of Health   Financial Resource Strain: Not on file  Food Insecurity: No Food Insecurity (04/10/2023)   Hunger Vital Sign    Worried About Running Out of Food in the Last Year: Never true    Ran Out of Food in the Last Year: Never true  Transportation Needs: No Transportation Needs (04/10/2023)   PRAPARE - Administrator, Civil Service (Medical): No    Lack of Transportation (Non-Medical): No  Physical Activity: Not on file  Stress: Not on file  Social Connections: Unknown (04/13/2023)   Social Connection and Isolation Panel [NHANES]    Frequency of Communication with Friends and Family: More than three times a week    Frequency of Social Gatherings with Friends and Family: Three times a week    Attends Religious Services: Patient declined    Active Member of Clubs or Organizations: No    Attends Banker Meetings: Patient declined    Marital Status: Living with partner  Intimate Partner Violence: Not At Risk (04/10/2023)   Humiliation, Afraid, Rape, and Kick questionnaire    Fear of Current or Ex-Partner: No    Emotionally Abused: No    Physically Abused: No    Sexually Abused: No     Review of  Systems: General: negative for chills, fever, night sweats or weight changes.  Cardiovascular: negative for chest pain, dyspnea on exertion, edema, orthopnea, palpitations, paroxysmal nocturnal dyspnea or shortness of breath Dermatological: negative for rash Respiratory: negative for cough or wheezing Urologic: negative for hematuria Abdominal: negative for nausea, vomiting, diarrhea, bright red blood per rectum, melena, or hematemesis Neurologic: negative for visual changes, syncope, or dizziness All other systems reviewed and are otherwise negative except as noted above.    Blood pressure 122/70, pulse 82, height 5\' 10"  (1.778 m), weight 228 lb 9.6 oz (103.7 kg),  SpO2 92%.  General appearance: alert and no distress Neck: no adenopathy, no carotid bruit, no JVD, supple, symmetrical, trachea midline, and thyroid not enlarged, symmetric, no tenderness/mass/nodules Lungs: clear to auscultation bilaterally Heart: regular rate and rhythm, S1, S2 normal, no murmur, click, rub or gallop Extremities: extremities normal, atraumatic, no cyanosis or edema Pulses: Diminished pedal pulses Skin: Skin color, texture, turgor normal. No rashes or lesions Neurologic: Grossly normal  EKG not performed today      ASSESSMENT AND PLAN:   Essential hypertension History of essential hypertension with blood pressure measured today at 122/70.  She is on carvedilol and losartan.  Dyslipidemia History of dyslipidemia on high-dose atorvastatin with lipid profile performed 04/03/2023 revealing total cholesterol 159, LDL 75 and HDL 35.  NSTEMI (non-ST elevated myocardial infarction) [facility]) History of CAD status post cardiac catheterization 11/21/2015 by Dr. Swaziland revealing a 90% proximal AV groove circumflex disease and 60% distal LAD lesion.  Her circumflex was stented with a drug-eluting stent.  She had a negative Myoview performed 08/13/2021.  She was admitted with a non-STEMI 04/13/2023 with troponins that went  up to the 300 range.  She underwent cardiac apposition by Dr. Excell Seltzer revealing a 90% proximal LAD stenosis involving a diagonal branch at a bifurcation.  Her circumflex that was patent.  She underwent provisional stenting of her LAD with ballooning of her diagonal branch ostium.  She had excellent angiographic result in the LAD with some ostial residual disease of her diagonal branch.  She has been asymptomatic since on aspirin and Brilinta.  Peripheral arterial disease (HCC) History of peripheral arterial disease with Dopplers that have shown a right ABI of 0.54 left of 0.70 with a short segment occlusion mid right SFA and tibial vessel disease as well as high-grade segmental mid left SFA stenosis.  She does complain of leg pain which she attributes to her back and knees.     [name] MD FACP,FACC,FAHA, [facility] 07/14/2023 3:24 PM

## 2023-07-14 NOTE — Patient Instructions (Signed)
 Medication Instructions:  Your physician recommends that you continue on your current medications as directed. Please refer to the Current Medication list given to you today.  *If you need a refill on your cardiac medications before your next appointment, please call your pharmacy*  Follow-Up: At [facility], you and your health needs are our priority.  As part of our continuing mission to provide you with exceptional heart care, our providers are all part of one team.  This team includes your primary Cardiologist (physician) and Advanced Practice Providers or APPs (Physician Assistants and Nurse Practitioners) who all work together to provide you with the care you need, when you need it.  Your next appointment:   6 month(s)  Provider:   Marjie Skiff, PA-C, Robet Leu, PA-C, Azalee Course, PA-C, Bernadene Person, NP, or Reather Littler, NP       Then, Nanetta Batty, MD will plan to see you again in 12 month(s).   We recommend signing up for the patient portal called "MyChart".  Sign up information is provided on this After Visit Summary.  MyChart is used to connect with patients for Virtual Visits (Telemedicine).  Patients are able to view lab/test results, encounter notes, upcoming appointments, etc.  Non-urgent messages can be sent to your provider as well.   To learn more about what you can do with MyChart, go to [email or web address].   Other Instructions       1st Floor: - Lobby - Registration  - Pharmacy  - Lab - Cafe  2nd Floor: - PV Lab - Diagnostic Testing (echo, CT, nuclear med)  3rd Floor: - Vacant  4th Floor: - TCTS (cardiothoracic surgery) - AFib Clinic - Structural Heart Clinic - Vascular Surgery  - Vascular Ultrasound  5th Floor: - HeartCare Cardiology (general and EP) - Clinical Pharmacy for coumadin, hypertension, lipid, weight-loss medications, and med management appointments    Valet parking services will be available as well.

## 2023-07-14 NOTE — Assessment & Plan Note (Signed)
 History of essential hypertension with blood pressure measured today at 122/70.  She is on carvedilol and losartan.

## 2023-08-11 ENCOUNTER — Telehealth: Payer: Self-pay

## 2023-08-11 NOTE — Telephone Encounter (Signed)
 Preoperative team,  Please contact patient and requesting office.  Patient had cardiac catheterization with stenting 04/13/2023 and was placed on dual antiplatelet therapy at that time.  She will not be eligible to hold dual antiplatelet therapy until 04/12/2024.  She will need to reschedule her dental work for sometime after 04/12/2024.  Thank you.  Chet Cota. Devinn Voshell NP-C     08/11/2023, 8:24 AM [facility] [address] Office [phone] Fax [phone]

## 2023-08-11 NOTE — Telephone Encounter (Signed)
 I reached out to patient but mailbox is full. I did fax over to requesting provider's office the recommendations about her pre-op clearance.

## 2023-08-11 NOTE — Telephone Encounter (Signed)
   Pre-operative Risk Assessment    Patient Name: Miranda [name]  DOB: 12/21/1955 MRN: [id number]   Date of last office visit: 07/14/2023 Dr. Lauro Portal Date of next office visit: 09/15/2023 [facility]  Request for Surgical Clearance    Procedure:  Dental Extraction - Amount of Teeth to be Pulled:  4  Date of Surgery:  Clearance TBD                                Surgeon: Cornelia Dieter Surgeon's Group or Practice Name: Cornelia Dieter, DMD, PA [facility] Phone number: [phone] Fax number: [phone]   Type of Clearance Requested:   - Medical  - Pharmacy:  Hold Aspirin      Type of Anesthesia:  Local / Nitrous    Additional requests/questions:    Tyrus Gallus   08/11/2023, 7:48 AM

## 2023-08-18 ENCOUNTER — Ambulatory Visit: Payer: 59 | Admitting: Podiatry

## 2023-08-20 ENCOUNTER — Other Ambulatory Visit

## 2023-08-20 ENCOUNTER — Other Ambulatory Visit: Payer: Self-pay | Admitting: Adult Health

## 2023-08-25 ENCOUNTER — Other Ambulatory Visit: Payer: Self-pay | Admitting: Podiatry

## 2023-08-27 ENCOUNTER — Other Ambulatory Visit (HOSPITAL_COMMUNITY): Payer: Self-pay

## 2023-09-15 ENCOUNTER — Ambulatory Visit: Admitting: Pharmacist

## 2023-09-15 NOTE — Progress Notes (Deleted)
 Patient ID: Miranda [name]                 DOB: 07/08/1955                    MRN: [id number]      HPI: Miranda [name] is a 68 y.o. female patient referred to lipid clinic by Dr. Katheryne Pane. PMH is significant for CVA, CAD (stent in 2017, NSTEMI 2024), DM, PAD, HTN. LDL-C in Dec 2024 was 75 (at the time of NSTEMI). Was 91 previous (05/23/22).   Dual complete   Reviewed options for lowering LDL cholesterol, including ezetimibe, PCSK-9 inhibitors, bempedoic acid and inclisiran.  Discussed mechanisms of action, dosing, side effects and potential decreases in LDL cholesterol.  Also reviewed cost information and potential options for patient assistance.   Current Medications: atorvastatin  80mg  daily Intolerances:  Risk Factors: progressive, premature CAD, DM, HTN, PAD LDL-C goal: <55 ApoB goal:   Diet:   Exercise:   Family History:   Social History:   Labs: Lipid Panel  LP(a) 176    Component Value Date/Time   CHOL 159 04/11/2023 0310   CHOL 162 03/08/2019 0919   TRIG 246 (H) 04/11/2023 0310   HDL 35 (L) 04/11/2023 0310   HDL 38 (L) 03/08/2019 0919   CHOLHDL 4.5 04/11/2023 0310   VLDL 49 (H) 04/11/2023 0310   LDLCALC 75 04/11/2023 0310   LDLCALC 91 05/23/2022 0000   LABVLDL 19 03/08/2019 0919    Past Medical History:  Diagnosis Date   Acid reflux    Anxiety    Asthma    CAD (coronary artery disease)    Stent to the circumflex 2017.   Degenerative disc disease    Depression    Diabetes mellitus    Hypertension    Hypoglycemia 12/30/2016   PVD (peripheral vascular disease) (HCC)    Stroke (HCC)    04/22/12    Current Outpatient Medications on File Prior to Visit  Medication Sig Dispense Refill   albuterol  (PROVENTIL  HFA;VENTOLIN  HFA) 108 (90 Base) MCG/ACT inhaler Inhale 2 puffs into the lungs every 6 (six) hours as needed for wheezing or shortness of breath. (Patient not taking: Reported on 04/11/2023)     amLODipine  (NORVASC ) 10 MG tablet Take 1 tablet (10 mg  total) by mouth daily. 90 tablet 1   aspirin  EC 81 MG tablet Take 1 tablet (81 mg total) by mouth daily. Swallow whole. 90 tablet 2   atorvastatin  (LIPITOR ) 80 MG tablet Take 1 tablet (80 mg total) by mouth daily at 6 PM. 45 tablet 0   buPROPion  (WELLBUTRIN  XL) 300 MG 24 hr tablet Take 300 mg by mouth daily.     carvedilol  (COREG ) 12.5 MG tablet TAKE 1 TABLET (12.5MG  TOTAL) BY MOUTH TWICE A DAY WITH MEALS 180 tablet 3   Cyanocobalamin  (B-12) 1000 MCG CAPS Take 1,000 mcg by mouth daily.      diclofenac  sodium (VOLTAREN ) 1 % GEL Apply 4 g topically 4 (four) times daily. 1 Tube 0   fluconazole  (DIFLUCAN ) 150 MG tablet Take one tab PO today and repeat dose in 3 days if symptoms persist 2 tablet 0   furosemide  (LASIX ) 20 MG tablet Take 1 tablet (20 mg total) by mouth daily. 3 tablet 0   gabapentin  (NEURONTIN ) 300 MG capsule Take 300 mg by mouth 2 (two) times daily.     LANTUS  SOLOSTAR 100 UNIT/ML Solostar Pen Inject 50 Units into the skin in the morning and at  bedtime.     LINZESS 145 MCG CAPS capsule Take 145 mcg by mouth daily.     losartan  (COZAAR ) 25 MG tablet Take 1 tablet (25 mg total) by mouth daily. 90 tablet 1   MOVANTIK  25 MG TABS tablet Take 25 mg by mouth daily before breakfast.   5   naloxone  (NARCAN ) nasal spray 4 mg/0.1 mL Place 0.4 mg into the nose once.     nitroGLYCERIN  (NITROSTAT ) 0.4 MG SL tablet Place 1 tablet (0.4 mg total) under the tongue every 5 (five) minutes x 3 doses as needed for chest pain. 25 tablet 2   oxyCODONE -acetaminophen  (PERCOCET) 10-325 MG tablet Take 1 tablet by mouth 4 (four) times daily as needed.     polyethylene glycol (MIRALAX  / GLYCOLAX ) packet Take 17 g by mouth daily as needed. 14 each 0   potassium chloride  (KLOR-CON ) 10 MEQ tablet Take 1 tablet (10 mEq total) by mouth daily. (Patient taking differently: Take 10 mEq by mouth as needed.) 3 tablet 0   ticagrelor  (BRILINTA ) 90 MG TABS tablet Take 1 tablet (90 mg total) by mouth 2 (two) times daily. 180  tablet 2   traZODone  (DESYREL ) 50 MG tablet Take 50 mg by mouth at bedtime as needed for sleep.      No current facility-administered medications on file prior to visit.    No Known Allergies  Assessment/Plan:  1. Hyperlipidemia -  No problem-specific Assessment & Plan notes found for this encounter.    Thank you,  Darcel Frane D Hiedi Touchton, Pharm.Monika Annas, CPP [facility] [address], [place], [place] [address]  Phone: [phone]; Fax: [phone]

## 2023-09-22 ENCOUNTER — Telehealth: Payer: Self-pay | Admitting: Cardiovascular Disease

## 2023-09-22 MED ORDER — TICAGRELOR 90 MG PO TABS: 90.0000 mg | ORAL_TABLET | Freq: Two times a day (BID) | ORAL | 2 refills | Status: DC

## 2023-09-22 MED ORDER — CARVEDILOL 12.5 MG PO TABS: 12.5000 mg | ORAL_TABLET | Freq: Two times a day (BID) | ORAL | 2 refills | Status: AC

## 2023-09-22 NOTE — Telephone Encounter (Signed)
*  STAT* If patient is at the pharmacy, call can be transferred to refill team.   1. Which medications need to be refilled? (please list name of each medication and dose if known)   carvedilol  (COREG ) 12.5 MG tablet    2. Which pharmacy/location (including street and city if local pharmacy) is medication to be sent to?  [id number] - [place], [place] - [address]      3. Do they need a 30 day or 90 day supply? 90 day    Pt is out of medication

## 2023-09-22 NOTE — Telephone Encounter (Signed)
*  STAT* If patient is at the pharmacy, call can be transferred to refill team.   1. Which medications need to be refilled? (please list name of each medication and dose if known)   ticagrelor  (BRILINTA ) 90 MG TABS tablet    2. Which pharmacy/location (including street and city if local pharmacy) is medication to be sent to?  [id number] - [place], [place] - [address]      3. Do they need a 30 day or 90 day supply? 90 day    Please add this refill to medication below as well. Pt forgot to mention once encounter was already sent. She'd out of this medication as well

## 2023-09-22 NOTE — Telephone Encounter (Signed)
 Pt's medication was sent to pt's pharmacy as requested. Confirmation received.

## 2023-09-22 NOTE — Addendum Note (Signed)
 Addended by: Gayleen Kawasaki D on: 09/22/2023 04:05 PM   Modules accepted: Orders

## 2023-09-28 ENCOUNTER — Telehealth: Payer: Self-pay | Admitting: Cardiovascular Disease

## 2023-09-28 ENCOUNTER — Other Ambulatory Visit: Payer: Self-pay

## 2023-09-28 ENCOUNTER — Emergency Department (HOSPITAL_COMMUNITY): Admission: EM | Admit: 2023-09-28 | Discharge: 2023-09-28 | Disposition: A | Discharge status: Home / Self Care

## 2023-09-28 ENCOUNTER — Emergency Department (HOSPITAL_COMMUNITY)

## 2023-09-28 ENCOUNTER — Encounter (HOSPITAL_COMMUNITY): Payer: Self-pay

## 2023-09-28 DIAGNOSIS — R0602 Shortness of breath: Secondary | ICD-10-CM | POA: Insufficient documentation

## 2023-09-28 DIAGNOSIS — R7989 Other specified abnormal findings of blood chemistry: Secondary | ICD-10-CM | POA: Diagnosis not present

## 2023-09-28 DIAGNOSIS — Z7982 Long term (current) use of aspirin: Secondary | ICD-10-CM | POA: Diagnosis not present

## 2023-09-28 DIAGNOSIS — R079 Chest pain, unspecified: Secondary | ICD-10-CM | POA: Diagnosis not present

## 2023-09-28 DIAGNOSIS — R600 Localized edema: Secondary | ICD-10-CM | POA: Insufficient documentation

## 2023-09-28 LAB — BASIC METABOLIC PANEL WITH GFR
Anion gap: 8 (ref 5–15)
BUN: 11 mg/dL (ref 8–23)
CO2: 23 mmol/L (ref 22–32)
Calcium: 8.9 mg/dL (ref 8.9–10.3)
Chloride: 110 mmol/L (ref 98–111)
Creatinine, Ser: 0.78 mg/dL (ref 0.44–1.00)
GFR, Estimated: >60 mL/min (ref >60)
Glucose, Bld: 73 mg/dL (ref 70–99)
Potassium: 3.7 mmol/L (ref 3.5–5.1)
Sodium: 141 mmol/L (ref 135–145)

## 2023-09-28 LAB — CBG MONITORING, ED
Glucose-Capillary: 57 mg/dL — ABNORMAL LOW (ref 70–99)
Glucose-Capillary: 134 mg/dL — ABNORMAL HIGH (ref 70–99)
Glucose-Capillary: 132 mg/dL — ABNORMAL HIGH (ref 70–99)

## 2023-09-28 LAB — CBC
HCT: 44.8 % (ref 36.0–46.0)
Hemoglobin: 13.4 g/dL (ref 12.0–15.0)
MCH: 27.2 pg (ref 26.0–34.0)
MCHC: 29.9 g/dL — ABNORMAL LOW (ref 30.0–36.0)
MCV: 91.1 fL (ref 80.0–100.0)
Platelets: 286 10*3/uL (ref 150–400)
RBC: 4.92 MIL/uL (ref 3.87–5.11)
RDW: 13.5 % (ref 11.5–15.5)
WBC: 6.8 10*3/uL (ref 4.0–10.5)
nRBC: 0 % (ref 0.0–0.2)

## 2023-09-28 LAB — BRAIN NATRIURETIC PEPTIDE: B Natriuretic Peptide: 257.2 pg/mL — ABNORMAL HIGH (ref 0.0–100.0)

## 2023-09-28 LAB — TROPONIN I (HIGH SENSITIVITY)
Troponin I (High Sensitivity): 31 ng/L — ABNORMAL HIGH (ref <18)
Troponin I (High Sensitivity): 30 ng/L — ABNORMAL HIGH (ref <18)

## 2023-09-28 NOTE — ED Triage Notes (Addendum)
 Pt to ED c/o CP and SHOB, intermittent in nature since yesterday around midnight. Reports cardiac hx. Pt diaphoretic.

## 2023-09-28 NOTE — Discharge Instructions (Signed)
 You may take a double dose of your Lasix  for the next 2 days.  Please follow-up with your primary doctor and your cardiologist.  Return if develop fevers, chills, worsening chest pain, worsening shortness of breath, lightheadedness, passout or any new or worsening symptoms that are concerning to you.

## 2023-09-28 NOTE — Telephone Encounter (Signed)
 Left voicemail to return call to office

## 2023-09-28 NOTE — ED Provider Triage Note (Signed)
 Emergency Medicine Provider Triage Evaluation Note  YAILYN STRACK , a 68 y.o. female  was evaluated in triage.  Pt complains of CP, SHOB since yesterday.  Review of Systems  Positive: Chest pressure, SHOB, orthopnea, nausea Negative: Emesis, fever, chills, abd pain, diarrhea  Physical Exam  BP (!) 145/61   Pulse 76   Temp 98.7 F (37.1 C)   Resp 18   Ht 5' 10 (1.778 m)   Wt 104.3 kg   SpO2 94%   BMI 33.00 kg/m  Gen:   Awake, no distress, diaphoretic Resp:  Normal effort MSK:   Moves extremities without difficulty  Other:  Mild, pitting edema BLE  Medical Decision Making  Medically screening exam initiated at 2:15 PM.  Appropriate orders placed.  LAURNA SHETLEY was informed that the remainder of the evaluation will be completed by another provider, this initial triage assessment does not replace that evaluation, and the importance of remaining in the ED until their evaluation is complete.  Labs and imaging ordered   Merryl Abraham 09/28/23 4782

## 2023-09-28 NOTE — Telephone Encounter (Signed)
 Patient calling to see if she has to wait a year to  have her teeth clean. Please advise

## 2023-09-28 NOTE — ED Provider Notes (Signed)
 [place] EMERGENCY DEPARTMENT AT [facility] Provider Note   CSN: [id number] Arrival date & time: 09/28/23  1400     Patient presents with: Chest Pain and Shortness of Breath   Miranda [name] is a 68 y.o. female.   This is a 68 year old female presenting emergency department with shortness of breath.  Reports symptoms worsening over the past week or so.  Had some intermittent chest pain yesterday and today.  None currently.  No lightheadedness, dizziness, palpitations.  No URI symptoms.  Bilateral leg swelling, but reports they have improved the past day or 2.   Chest Pain Associated symptoms: shortness of breath   Shortness of Breath Associated symptoms: chest pain        Prior to Admission medications   Medication Sig Start Date End Date Taking? Authorizing Provider  albuterol  (PROVENTIL  HFA;VENTOLIN  HFA) 108 (90 Base) MCG/ACT inhaler Inhale 2 puffs into the lungs every 6 (six) hours as needed for wheezing or shortness of breath. Patient not taking: Reported on 04/11/2023    [provider]  amLODipine  (NORVASC ) 10 MG tablet Take 1 tablet (10 mg total) by mouth daily. 04/15/23   Sanjuanita Cruz, NP  aspirin  EC 81 MG tablet Take 1 tablet (81 mg total) by mouth daily. Swallow whole. 04/15/23   Sanjuanita Cruz, NP  atorvastatin  (LIPITOR ) 80 MG tablet Take 1 tablet (80 mg total) by mouth daily at 6 PM. 05/20/23   Avanell Leigh, MD  buPROPion  (WELLBUTRIN  XL) 300 MG 24 hr tablet Take 300 mg by mouth daily. 03/19/23   [provider]  carvedilol  (COREG ) 12.5 MG tablet Take 1 tablet (12.5 mg total) by mouth 2 (two) times daily with a meal. 09/22/23   Avanell Leigh, MD  Cyanocobalamin  (B-12) 1000 MCG CAPS Take 1,000 mcg by mouth daily.     [provider]  diclofenac  sodium (VOLTAREN ) 1 % GEL Apply 4 g topically 4 (four) times daily. 02/11/18   Rizwan, Saima, MD  fluconazole  (DIFLUCAN ) 150 MG tablet Take one tab PO today and repeat dose in 3  days if symptoms persist 06/15/23   Vernestine Gondola, PA-C  furosemide  (LASIX ) 20 MG tablet Take 1 tablet (20 mg total) by mouth daily. 09/05/19   Lind Repine, MD  gabapentin  (NEURONTIN ) 300 MG capsule Take 300 mg by mouth 2 (two) times daily.    [provider]  LANTUS  SOLOSTAR 100 UNIT/ML Solostar Pen Inject 50 Units into the skin in the morning and at bedtime. 08/15/19   [provider]  LINZESS 145 MCG CAPS capsule Take 145 mcg by mouth daily. 01/16/23   [provider]  losartan  (COZAAR ) 25 MG tablet Take 1 tablet (25 mg total) by mouth daily. 04/14/23   Sanjuanita Cruz, NP  MOVANTIK  25 MG TABS tablet Take 25 mg by mouth daily before breakfast.  12/12/16   [provider]  naloxone  (NARCAN ) nasal spray 4 mg/0.1 mL Place 0.4 mg into the nose once. 01/29/23   [provider]  nitroGLYCERIN  (NITROSTAT ) 0.4 MG SL tablet Place 1 tablet (0.4 mg total) under the tongue every 5 (five) minutes x 3 doses as needed for chest pain. 04/14/23   Sanjuanita Cruz, NP  oxyCODONE -acetaminophen  (PERCOCET) 10-325 MG tablet Take 1 tablet by mouth 4 (four) times daily as needed. 04/03/23   [provider]  polyethylene glycol (MIRALAX  / GLYCOLAX ) packet Take 17 g by mouth daily as needed. 02/11/18   Rizwan, Saima, MD  potassium chloride  (KLOR-CON ) 10 MEQ tablet Take 1 tablet (10 mEq total) by mouth daily. Patient taking differently: Take 10 mEq by mouth as needed. 09/05/19   Lind Repine, MD  ticagrelor  (BRILINTA ) 90 MG TABS tablet Take 1 tablet (90 mg total) by mouth 2 (two) times daily. 09/22/23   Avanell Leigh, MD  traZODone  (DESYREL ) 50 MG tablet Take 50 mg by mouth at bedtime as needed for sleep.     [provider]    Allergies: Patient has no known allergies.    Review of Systems  Respiratory:  Positive for shortness of breath.   Cardiovascular:  Positive for chest pain.    Updated Vital Signs BP 126/63 (BP Location: Left Arm)    Pulse 72   Temp 98.5 F (36.9 C)   Resp 17   Ht 5' 10 (1.778 m)   Wt 104.3 kg   SpO2 96%   BMI 33.00 kg/m   Physical Exam Vitals and nursing note reviewed.  Constitutional:      General: She is not in acute distress.    Appearance: She is not toxic-appearing.  HENT:     Head: Normocephalic.   Cardiovascular:     Rate and Rhythm: Normal rate.     Heart sounds: Normal heart sounds.  Pulmonary:     Breath sounds: Normal breath sounds. No wheezing, rhonchi or rales.   Musculoskeletal:     Right lower leg: Edema present.     Left lower leg: Edema present.   Skin:    General: Skin is warm.     Capillary Refill: Capillary refill takes less than 2 seconds.   Neurological:     Mental Status: She is alert and oriented to person, place, and time.   Psychiatric:        Mood and Affect: Mood normal.        Behavior: Behavior normal.     (all labs ordered are listed, but only abnormal results are displayed) Labs Reviewed  CBC - Abnormal; Notable for the following components:      Result Value   MCHC 29.9 (*)    All other components within normal limits  BRAIN NATRIURETIC PEPTIDE - Abnormal; Notable for the following components:   B Natriuretic Peptide 257.2 (*)    All other components within normal limits  CBG MONITORING, ED - Abnormal; Notable for the following components:   Glucose-Capillary 57 (*)    All other components within normal limits  CBG MONITORING, ED - Abnormal; Notable for the following components:   Glucose-Capillary 134 (*)    All other components within normal limits  CBG MONITORING, ED - Abnormal; Notable for the following components:   Glucose-Capillary 132 (*)    All other components within normal limits  TROPONIN I (HIGH SENSITIVITY) - Abnormal; Notable for the following components:   Troponin I (High Sensitivity) 31 (*)    All other components within normal limits  TROPONIN I (HIGH SENSITIVITY) - Abnormal; Notable for the following components:    Troponin I (High Sensitivity) 30 (*)    All other components within normal limits  BASIC METABOLIC PANEL WITH GFR    EKG: EKG Interpretation Date/Time:  Monday September 28 2023 14:13:31 EDT Ventricular Rate:  77 PR Interval:  174 QRS Duration:  86 QT Interval:  388 QTC Calculation: 439 R Axis:   20  Text Interpretation: Normal sinus rhythm Nonspecific T wave abnormality Abnormal ECG When compared with ECG of 14-Apr-2023 05:09, PREVIOUS ECG IS PRESENT  Confirmed by Elise Guile [phone]) on 09/28/2023 9:41:47 PM  Radiology: Len[facility] Query Chest 2 View Result Date: 09/28/2023 CLINICAL DATA:  cp EXAM: CHEST - 2 VIEW COMPARISON:  April 10, 2023 FINDINGS: Low lung volumes. No focal airspace consolidation, pleural effusion, or pneumothorax. No cardiomegaly. No acute fracture or destructive lesion. IMPRESSION: No acute cardiopulmonary abnormality. Electronically Signed   By: Rance Burrows M.D.   On: 09/28/2023 15:04     Procedures   Medications Ordered in the ED - No data to display  Clinical Course as of 09/28/23 2250  Mon Sep 28, 2023  2208 Cath 03/2023: 1.  Severe stenosis of the proximal LAD/first diagonal bifurcation treated successfully with PCI using a provisional approach, stenting of the LAD and balloon angioplasty of the diagonal (3.0 x 16 mm Synergy DES in the LAD) 2.  Continued patency of the proximal circumflex stent (left dominant circumflex) 3.  Patency of the nondominant RCA 4.  Moderately elevated LVEDP   Recommendations: Aggressive risk reduction measures, DAPT with aspirin  and ticagrelor  at least 12 months without interruption, discharge tomorrow if no early complications arise    [TY]  2209 Echo 04/11/23:IMPRESSIONS     1. Left ventricular ejection fraction, by estimation, is >75%. Left  ventricular ejection fraction by PLAX is 76 %. The left ventricle has  hyperdynamic function. The left ventricle has no regional wall motion  abnormalities. There is mild left  ventricular   hypertrophy. Left ventricular diastolic parameters are consistent with  Grade I diastolic dysfunction (impaired relaxation). Elevated left  ventricular end-diastolic pressure.   2. Right ventricular systolic function is normal. The right ventricular  size is normal. Tricuspid regurgitation signal is inadequate for assessing  PA pressure.   3. The mitral valve is abnormal. Trivial mitral valve regurgitation.  Moderate mitral annular calcification.   4. The aortic valve is tricuspid. Aortic valve regurgitation is not  visualized.   5. The inferior vena cava is normal in size with greater than 50%  respiratory variability, suggesting right atrial pressure of 3 mmHg.   [TY]    Clinical Course User Index [TY] Rolinda Climes, DO                                 Medical Decision Making This is a well-appearing 68 year old female presenting emergency department for chest pain and shortness of breath.  She is chest pain-free.  Shortness of breath more dyspnea on exertion, no orthopnea or PND.  Not on oxygen at home.  She is not in respiratory distress maintaining oxygen saturation on room air.  Ambulated without desaturation.  Chest x-ray without pneumonia pneumothorax.  Does not appear to be fluid overloaded.  Her troponin flat and improved compared to prior.  Minor elevation in her BNP, appears she is elevated in the past.  Clinically does have some lower extremity edema, but notes that it is improved compared to prior.  Does not appear to be acute decompensated heart failure.  No fever or leukocytosis to suggest infectious process.  No metabolic derangements.  Low risk for PE based on Wells criteria.  Discussed increasing Lasix  for a day or 2 and following up with her cardiologist and primary doctor.  Agreeable to plan.  Discharged stable condition.  Strict return precautions given.  Amount and/or Complexity of Data Reviewed Labs: ordered. Radiology: ordered.      Final  diagnoses:  None    ED Discharge Orders  None          Rolinda Climes, DO 09/28/23 2250

## 2023-09-28 NOTE — ED Notes (Signed)
 Pt ambulated with assistance to the bathroom. Pt o2 dropped to 90%

## 2023-09-28 NOTE — Telephone Encounter (Signed)
 Spoke with patient and she states she was not cleared to have her tooth extraction until end of the year. She would like to know is it safe to have a cleaning?

## 2023-09-29 NOTE — Telephone Encounter (Signed)
 Spoke with patient and she is aware it is safe to have her teeth cleaned.  She states she went to ED yesterday and would like for you review notes. She states they advised for her to increase her lasix  and she would like to know if you agree with recommendations

## 2023-10-01 ENCOUNTER — Other Ambulatory Visit (HOSPITAL_COMMUNITY): Payer: Self-pay

## 2023-10-06 ENCOUNTER — Other Ambulatory Visit: Payer: Self-pay | Admitting: Cardiovascular Disease

## 2023-10-12 ENCOUNTER — Ambulatory Visit: Admitting: Pharmacist

## 2023-10-19 ENCOUNTER — Other Ambulatory Visit: Payer: Self-pay

## 2023-10-19 MED ORDER — TICAGRELOR 90 MG PO TABS: 90.0000 mg | ORAL_TABLET | Freq: Two times a day (BID) | ORAL | 2 refills | Status: DC

## 2023-10-23 ENCOUNTER — Other Ambulatory Visit: Payer: Self-pay

## 2023-10-23 MED ORDER — TICAGRELOR 90 MG PO TABS: 90.0000 mg | ORAL_TABLET | Freq: Two times a day (BID) | ORAL | 2 refills | Status: DC

## 2023-11-03 ENCOUNTER — Ambulatory Visit: Admitting: Podiatry

## 2023-11-18 ENCOUNTER — Ambulatory Visit (INDEPENDENT_AMBULATORY_CARE_PROVIDER_SITE_OTHER): Admitting: Podiatry

## 2023-11-18 ENCOUNTER — Encounter: Payer: Self-pay | Admitting: Podiatry

## 2023-11-18 DIAGNOSIS — E1142 Type 2 diabetes mellitus with diabetic polyneuropathy: Secondary | ICD-10-CM | POA: Diagnosis not present

## 2023-11-18 DIAGNOSIS — G5790 Unspecified mononeuropathy of unspecified lower limb: Secondary | ICD-10-CM | POA: Diagnosis not present

## 2023-11-18 MED ORDER — PREGABALIN 50 MG PO CAPS: 50.0000 mg | ORAL_CAPSULE | Freq: Three times a day (TID) | ORAL | 0 refills | Status: DC

## 2023-11-18 MED ORDER — PREGABALIN 100 MG PO CAPS: 100.0000 mg | ORAL_CAPSULE | Freq: Three times a day (TID) | ORAL | 3 refills | Status: DC

## 2023-11-18 NOTE — Progress Notes (Signed)
 Patient complains today complaining of numbness tingling burning in her feet.  She been taking gabapentin  600 mg 1 p.o. 3 times daily for several months now and has not seemed to improve at all.  Worse in the evening and when she is sitting.  Has not noticed any coloration changes in her feet.   Physical exam:  General appearance: Pleasant, and in no acute distress. AOx3.  Vascular: Pedal pulses: DP 1/4 bilaterally, PT 2/4 bilaterally.  Mild to moderate edema lower legs bilaterally. Capillary fill time immediate bilateral.  Neurological: Decreased vibratory sensation in feet bilaterally.  Normal Achilles tendon reflex bilaterally.  Complaint of burning and numbness and tingling in feet.  Dermatologic:   Skin normal temperature bilaterally.  Skin normal color, tone, and texture bilaterally.   Musculoskeletal: Hammertoes 2 through 5 bilaterally.  Normal muscle strength lower extremity bilaterally    Diagnosis: 1.  Neuritis feet bilaterally 2.  Diabetic peripheral neuropathy bilaterally.  Plan: -Established office visit for evaluation and management level 3. - Discussed about neuropathy.  She does seem to be responding well to the gabapentin  600 mg p.o. 3 times daily.  Will have her D/C the gabapentin  and begin the Lyrica .  States she has she also is smoking again.  Recommended she quit doing this as this will not help with [place] and overall health. -Rx Lyrica  50 mg, 1 p.o. 3 times daily for 2 weeks. - Rx Lyrica  100 mg, 1 p.o. 3 times daily, 3 refills  Return 3 months follow-up diabetic neuropathy and next available appointment for [facility]

## 2023-11-27 ENCOUNTER — Ambulatory Visit: Attending: Cardiovascular Disease | Admitting: Pharmacist

## 2023-12-02 ENCOUNTER — Ambulatory Visit: Admitting: Podiatry

## 2024-01-10 NOTE — Progress Notes (Unsigned)
 Cardiology Office Note    Date:  01/10/2024  ID:  Miranda [name], DOB 1955-06-10, MRN [id number] PCP:  Shelda Atlas, MD  Cardiologist:  Dorn Lesches, MD  Electrophysiologist:  None   Chief Complaint: ***  History of Present Illness: .    CUCA BENASSI is a 68 y.o. female with visit-pertinent history of hypertension, hyperlipidemia, diabetes mellitus, CAD s/p NSTEMI in 03/2023.  Per notes patient with history of stroke.  In 11/2015 patient was admitted with chest pain and had a minimally elevated troponin, underwent cardiac catheterization on 11/21/2015 by Dr. [place] revealing a 90% proximal AV groove circumflex, 60% distal LAD lesion, her circumflex was stented with a DES.  Patient had Myoview  stress test in 08/2021 that was nonischemic with normal EF.  In 03/2023 patient was admitted with NSTEMI, troponins increased to 250.  She underwent cardiac cath by Dr. Wonda on 04/13/2023 revealing a patent proximal AV groove circumflex stent with high-grade LAD diagonal branch bifurcation disease in the proximal LAD.  She underwent provisional stenting of her LAD that was tortuous, did have residual ostial diagonal branch disease at the end, was continued on DAPT with Brilinta  and aspirin .  Echocardiogram on 04/11/2023 indicated LVEF greater than 75%, no RWMA, mild LVH, G1 DD, elevated left ventricular end-diastolic pressures, RV systolic function and size was normal, trivial mitral valve regurgitation with moderate mitral annular calcification, aortic valve digitation was not visualized.  Patient was last seen in clinic by Dr. Wadie on 07/14/2023.  She was asymptomatic at the time.  On chart review on 09/28/2023 patient presented to the emergent department with shortness of breath.  Reported that her symptoms have been worsening over the prior week, also reported some intermittent chest discomfort as well as bilateral leg swelling however improved in the prior 2 days.  EKG showed normal sinus rhythm  with nonspecific T wave abnormality, BNP was minimally elevated around 200, high sensitive troponin 31>>30.  Patient Lasix  was increased for 2 days with recommendation to follow-up with cardiology.  Today she presents for follow-up.  She reports that she    CAD: Patient with stenting of the circumflex in 2017.  Again admitted in 03/2023 with NSTEMI revealing a patent proximal AV groove circumflex stent with high-grade LAD diagonal branch bifurcation disease in the proximal LAD, underwent stenting of the LAD that was tortuous, had residual ostial diagonal branch disease.  Continued on DAPT with aspirin  and Brilinta . Today she reports Reviewed ED precautions. Continue  Hypertension: Blood pressure today  Dyslipidemia: Last lipid profile in 04/11/2023 indicated total cholesterol 159, HDL 35, triglycerides 246, lipoprotein a 176.0 and LDL 75.  Patient has previously been referred to [facility] lipid clinic however has no-showed multiple appointments. Check fasting lipid profile and LFTs  PAD: Patient with history of peripheral arterial disease with Dopplers that have shown a right ABI of 0.54, left of 0.70 with short segment occlusion of mid right SFA and tibial vessel disease as well as high-grade segmental mid left FSA stenosis.  Tobacco use:     Labwork independently reviewed:   ROS: .   *** denies chest pain, shortness of breath, lower extremity edema, fatigue, palpitations, melena, hematuria, hemoptysis, diaphoresis, weakness, presyncope, syncope, orthopnea, and PND.  All other systems are reviewed and otherwise negative.  Studies Reviewed: SABRA    EKG:  EKG is ordered today, personally reviewed, demonstrating ***     CV Studies: Cardiac studies reviewed are outlined and summarized above. Otherwise please see EMR for full report.  Cardiac Studies & Procedures   ______________________________________________________________________________________________ CARDIAC  CATHETERIZATION  CARDIAC CATHETERIZATION 04/13/2023  Conclusion 1.  Severe stenosis of the proximal LAD/first diagonal bifurcation treated successfully with PCI using a provisional approach, stenting of the LAD and balloon angioplasty of the diagonal (3.0 x 16 mm Synergy DES in the LAD) 2.  Continued patency of the proximal circumflex stent (left dominant circumflex) 3.  Patency of the nondominant RCA 4.  Moderately elevated LVEDP  Recommendations: Aggressive risk reduction measures, DAPT with aspirin  and ticagrelor  at least 12 months without interruption, discharge tomorrow if no early complications arise  Findings Coronary Findings Diagnostic  Dominance: Left  Left Main Vessel is normal in caliber. Vessel is angiographically normal. The left main is large in caliber, it trifurcates into an LAD, circumflex, and small intermediate branch.  The distal left main has mild 30% stenosis. Mid LM to Dist LM lesion is 30% stenosed.  Left Anterior Descending The LAD is very tortuous.  The vessel reaches the apex.  There is a critical 95% stenosis involving the LAD/first diagonal bifurcation with 95% stenosis in the LAD and tight ostial stenosis of the diagonal. Prox LAD lesion is 95% stenosed. Dist LAD lesion is 60% stenosed. The lesion is discrete.  First Diagonal Branch 1st Diag lesion is 90% stenosed.  Left Circumflex Vessel is large. The circumflex is a large, dominant vessel.  The stented segment in the proximal to mid circumflex is widely patent with no stenosis and it terminates just prior to the bifurcation of a large first OM and AV circumflex.  The left PDA and PL branches are patent with no stenoses. Non-stenotic Prox Cx to Mid Cx lesion was previously treated. The lesion is located proximal to the major branch and discrete.  First Obtuse Marginal Branch Vessel is large in size.  Right Coronary Artery Vessel is small. There is mild diffuse disease throughout the vessel.  Nondominant vessel, moderate caliber.  No significant stenosis.  Intervention  Prox LAD lesion Stent Pre-stent angioplasty was performed using a BALLN EMERGE MR 2.0X12. A drug-eluting stent was successfully placed using a SYNERGY XD 3.0X16 in the main branch. Post-stent angioplasty was performed using a BALLN [place] EMERGE MR 3.5X12. The LAD lesion is crossed with a whisper wire.  The diagonal is protected with another whisper wire.  The LAD is predilated with plaque shift into the diagonal.  The diagonal is predilated.  The LAD is stented with a 3.0 x 16 mm Synergy DES that stent postdilated with a 3.5 mm noncompliant balloon to 18 atm.  There is TIMI-3 flow and 0% residual stenosis at the LAD lesion site after the procedure. Post-Intervention Lesion Assessment The intervention was successful. Pre-interventional TIMI flow is 3. Post-intervention TIMI flow is 3. No complications occurred at this lesion. There is a 0% residual stenosis post intervention.  1st Diag lesion Angioplasty Angioplasty was performed in the side branch. Balloon angioplasty was performed using a BALLN EMERGE MR 2.0X12. The diagonal is wired with a whisper wire.  After the LAD is dilated, there is plaque shift with very severe stenosis of the diagonal.  The diagonal ostium is predilated with a 2.0 x 12 mm balloon to 8 atm.  The LAD is then stented and postdilated.  The diagonal remains patent following LAD stenting and postdilatation.  I tried to rewire the diagonal but continued to get behind the LAD stent struts without any success passing the balloon into the diagonal branch.  At the completion of the procedure there is  TIMI-3 flow into the diagonal with 60% residual ostial stenosis. Post-Intervention Lesion Assessment The intervention was successful. Pre-interventional TIMI flow is 3. Post-intervention TIMI flow is 3. No complications occurred at this lesion. There is a 60% residual stenosis post intervention.   CARDIAC  CATHETERIZATION  CARDIAC CATHETERIZATION 11/21/2015  Conclusion  Dist LAD lesion, 60 %stenosed.  There is hyperdynamic left ventricular systolic function.  LV end diastolic pressure is moderately elevated.  The left ventricular ejection fraction is greater than 65% by visual estimate.  A STENT SYNERGY DES 3.5X12 drug eluting stent was successfully placed.  Prox Cx lesion, 90 %stenosed.  Post intervention, there is a 0% residual stenosis.  1. 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 2. Hyperdynamic LV function 3. Successful stenting of the proximal LCx with a DES  Plan: DAPT for one year. Aggressive risk factor modification.  Findings Coronary Findings Diagnostic  Dominance: Left  Left Main Vessel was injected. Vessel is normal in caliber. Vessel is angiographically normal.  Left Anterior Descending The lesion is discrete.  Left Circumflex Vessel is large. The lesion is located proximal to the major branch and discrete.  First Obtuse Marginal Branch Vessel is large in size.  Right Coronary Artery Vessel was injected. Vessel is small. Vessel is angiographically normal.  Intervention  Prox Cx lesion Angioplasty Lesion length: 10 mm. Lesion crossed with guidewire using a WIRE ASAHI PROWATER 180CM. Pre-stent angioplasty was performed using a BALLOON EMERGE MR 2.5X12. Maximum pressure: 6 atm. A STENT SYNERGY DES 3.5X12 drug eluting stent was successfully placed. Stent strut is well apposed. Post-stent angioplasty was performed using a BALLOON [place] EMERGE MR 4.0X8. Maximum pressure: 16 atm. The pre-interventional distal flow is normal (TIMI 3).  The post-interventional distal flow is normal (TIMI 3). The intervention was successful . No complications occurred at this lesion. The proximal LCx tapers across the lesion. We carefully positioned the stent just proximal to the takeoff of a large OM1. The distal portion of the  stent was post dilated with a 3.5 mm [place] balloon to 18 atm. The proximal portion of the stent was postdilated with a 4.0 mm [place] balloon to 16 atm. An excellent angiographic result was achieved. There is a 0% residual stenosis post intervention.   STRESS TESTS  MYOCARDIAL PERFUSION IMAGING 08/13/2021  Interpretation Summary   The study is normal. The study is low risk.   No ST deviation was noted.   LV perfusion is normal.   Left ventricular function is normal. Nuclear stress EF: 67 %. The left ventricular ejection fraction is hyperdynamic (>65%). End diastolic cavity size is normal.   Prior study not available for comparison.  Low risk stress nuclear study with normal perfusion and normal left ventricular regional and global systolic function.   ECHOCARDIOGRAM  ECHOCARDIOGRAM COMPLETE 04/11/2023  Narrative ECHOCARDIOGRAM REPORT    Patient Name:   JOHANNY SEGERS Date of Exam: 04/11/2023 Medical Rec #:  [id number]        Height:       70.0 in Accession #:    [id number]       Weight:       220.5 lb Date of Birth:  11-Mar-1956        BSA:          2.176 m Patient Age:    67 years         BP:           163/99 mmHg Patient  Gender: F                HR:           67 bpm. Exam Location:  Inpatient  Procedure: 2D Echo, Color Doppler and Cardiac Doppler  Indications:    chest pain  History:        Patient has prior history of Echocardiogram examinations, most recent 11/21/2015. CAD, PAD and history of stroke; Risk Factors:Hypertension, Dyslipidemia and Current Smoker.  Sonographer:    Tinnie Barefoot RDCS Referring Phys: [phone] HAO MENG  IMPRESSIONS   1. Left ventricular ejection fraction, by estimation, is >75%. Left ventricular ejection fraction by PLAX is 76 %. The left ventricle has hyperdynamic function. The left ventricle has no regional wall motion abnormalities. There is mild left ventricular hypertrophy. Left ventricular diastolic parameters are consistent with Grade I  diastolic dysfunction (impaired relaxation). Elevated left ventricular end-diastolic pressure. 2. Right ventricular systolic function is normal. The right ventricular size is normal. Tricuspid regurgitation signal is inadequate for assessing PA pressure. 3. The mitral valve is abnormal. Trivial mitral valve regurgitation. Moderate mitral annular calcification. 4. The aortic valve is tricuspid. Aortic valve regurgitation is not visualized. 5. The inferior vena cava is normal in size with greater than 50% respiratory variability, suggesting right atrial pressure of 3 mmHg.  FINDINGS Left Ventricle: Left ventricular ejection fraction, by estimation, is >75%. Left ventricular ejection fraction by PLAX is 76 %. The left ventricle has hyperdynamic function. The left ventricle has no regional wall motion abnormalities. The left ventricular internal cavity size was normal in size. There is mild left ventricular hypertrophy. Left ventricular diastolic parameters are consistent with Grade I diastolic dysfunction (impaired relaxation). Elevated left ventricular end-diastolic pressure.  Right Ventricle: The right ventricular size is normal. No increase in right ventricular wall thickness. Right ventricular systolic function is normal. Tricuspid regurgitation signal is inadequate for assessing PA pressure.  Left Atrium: Left atrial size was normal in size.  Right Atrium: Right atrial size was normal in size.  Pericardium: There is no evidence of pericardial effusion.  Mitral Valve: The mitral valve is abnormal. Moderate mitral annular calcification. Trivial mitral valve regurgitation.  Tricuspid Valve: The tricuspid valve is grossly normal. Tricuspid valve regurgitation is trivial.  Aortic Valve: The aortic valve is tricuspid. Aortic valve regurgitation is not visualized.  Pulmonic Valve: The pulmonic valve was normal in structure. Pulmonic valve regurgitation is not visualized.  Aorta: The aortic root  and ascending aorta are structurally normal, with no evidence of dilitation.  Venous: The inferior vena cava is normal in size with greater than 50% respiratory variability, suggesting right atrial pressure of 3 mmHg.  IAS/Shunts: No atrial level shunt detected by color flow Doppler.   LEFT VENTRICLE PLAX 2D LV EF:         Left            Diastology ventricular     LV e' medial:    4.24 cm/s ejection        LV E/e' medial:  18.2 fraction by     LV e' lateral:   5.33 cm/s PLAX is 76      LV E/e' lateral: 14.5 %. LVIDd:         4.70 cm LVIDs:         2.60 cm LV PW:         1.00 cm LV IVS:        1.10 cm LVOT diam:     2.00  cm LV SV:         83 LV SV Index:   38 LVOT Area:     3.14 cm   RIGHT VENTRICLE             IVC RV Basal diam:  2.50 cm     IVC diam: 1.50 cm RV S prime:     12.00 cm/s TAPSE (M-mode): 1.4 cm  LEFT ATRIUM             Index        RIGHT ATRIUM           Index LA diam:        3.60 cm 1.65 cm/m   RA Area:     10.70 cm LA Vol (A2C):   61.4 ml 28.22 ml/m  RA Volume:   19.40 ml  8.92 ml/m LA Vol (A4C):   40.7 ml 18.71 ml/m LA Biplane Vol: 52.3 ml 24.04 ml/m AORTIC VALVE LVOT Vmax:   112.00 cm/s LVOT Vmean:  76.400 cm/s LVOT VTI:    0.265 m  AORTA Ao Root diam: 3.00 cm Ao Asc diam:  3.00 cm  MITRAL VALVE MV Area (PHT): 2.60 cm     SHUNTS MV Decel Time: 292 msec     Systemic VTI:  0.26 m MV E velocity: 77.10 cm/s   Systemic Diam: 2.00 cm MV A velocity: 129.00 cm/s MV E/A ratio:  0.60  Vinie Maxcy MD Electronically signed by Vinie Maxcy MD Signature Date/Time: 04/11/2023/5:51:08 PM    Final          ______________________________________________________________________________________________       Current Reported Medications:.    No outpatient medications have been marked as taking for the 01/13/24 encounter (Appointment) with Stephana Morell D, NP.    Physical Exam:    VS:  There were no vitals taken for this visit.   Wt  Readings from Last 3 Encounters:  09/28/23 230 lb (104.3 kg)  07/14/23 228 lb 9.6 oz (103.7 kg)  04/12/23 229 lb 15 oz (104.3 kg)    GEN: Well nourished, well developed in no acute distress NECK: No JVD; No carotid bruits CARDIAC: ***RRR, no murmurs, rubs, gallops RESPIRATORY:  Clear to auscultation without rales, wheezing or rhonchi  ABDOMEN: Soft, non-tender, non-distended EXTREMITIES:  No edema; No acute deformity     Asessement and Plan:.     ***     Disposition: F/u with ***  Signed, Ardene Remley D Anabel Lykins, NP

## 2024-01-13 ENCOUNTER — Ambulatory Visit: Attending: Internal Medicine | Admitting: Cardiology

## 2024-01-13 ENCOUNTER — Ambulatory Visit (INDEPENDENT_AMBULATORY_CARE_PROVIDER_SITE_OTHER)

## 2024-01-13 ENCOUNTER — Encounter: Payer: Self-pay | Admitting: Cardiology

## 2024-01-13 VITALS — BP 152/84 | HR 67 | Ht 70.0 in | Wt 224.0 lb

## 2024-01-13 DIAGNOSIS — Z720 Tobacco use: Secondary | ICD-10-CM

## 2024-01-13 DIAGNOSIS — I10 Essential (primary) hypertension: Secondary | ICD-10-CM | POA: Diagnosis not present

## 2024-01-13 DIAGNOSIS — R002 Palpitations: Secondary | ICD-10-CM

## 2024-01-13 DIAGNOSIS — I2510 Atherosclerotic heart disease of native coronary artery without angina pectoris: Secondary | ICD-10-CM | POA: Diagnosis not present

## 2024-01-13 DIAGNOSIS — I739 Peripheral vascular disease, unspecified: Secondary | ICD-10-CM

## 2024-01-13 DIAGNOSIS — E7800 Pure hypercholesterolemia, unspecified: Secondary | ICD-10-CM

## 2024-01-13 MED ORDER — LOSARTAN POTASSIUM 25 MG PO TABS: 25.0000 mg | ORAL_TABLET | Freq: Every day | ORAL | 3 refills | Status: DC

## 2024-01-13 NOTE — Patient Instructions (Signed)
 Medication Instructions:  START LOSARTAN  25 MG TAKE ONE TABLET DAILY *If you need a refill on your cardiac medications before your next appointment, please call your pharmacy*  Lab Work: Today- CBC, CMET 2 weeks- BMET, Fasting Lipids If you have labs (blood work) drawn today and your tests are completely normal, you will receive your results only by: MyChart Message (if you have MyChart) OR A paper copy in the mail If you have any lab test that is abnormal or we need to change your treatment, we will call you to review the results.  Testing/Procedures:  Miranda [name]- Long Term Monitor Instructions  Your physician has requested you wear a ZIO patch monitor for 7 days.  This is a single patch monitor. [employer] supplies one patch monitor per enrollment. Additional stickers are not available. Please do not apply patch if you will be having a Nuclear Stress Test,  Echocardiogram, Cardiac CT, MRI, or Chest Xray during the period you would be wearing the  monitor. The patch cannot be worn during these tests. You cannot remove and re-apply the  ZIO XT patch monitor.   Billing and Patient Assistance Program Information  We have supplied [employer] with any of your insurance information on file for billing purposes. [employer] offers a sliding scale Patient Assistance Program for patients that do not have  insurance, or whose insurance does not completely cover the cost of the ZIO monitor.  You must apply for the Patient Assistance Program to qualify for this discounted rate.  To apply, please call [employer] at [phone], select option 4, select option 2, ask to apply for  Patient Assistance Program. [name] will ask your household income, and how many people  are in your household. They will quote your out-of-pocket cost based on that information.  [employer] will also be able to set up a 24-month, interest-free payment plan if needed.  When you are ready to remove the patch, follow instructions on the  last 2 pages of Patient  Logbook. Stick patch monitor onto the last page of Patient Logbook.  Place Patient Logbook in the blue and white box. Use locking tab on box and tape box closed  securely. The blue and white box has prepaid postage on it. Please place it in the mailbox as  soon as possible. Your physician should have your test results approximately 7 days after the  monitor has been mailed back to [facility].  Call [facility] Customer Care at [phone] if you have questions regarding  your ZIO XT patch monitor. Call them immediately if you see an orange light blinking on your  monitor.  If your monitor falls off in less than 4 days, contact our Monitor department at [phone].  If your monitor becomes loose or falls off after 4 days call [employer] at [phone] for  suggestions on securing your monitor   Follow-Up: At [facility], you and your health needs are our priority.  As part of our continuing mission to provide you with exceptional heart care, our providers are all part of one team.  This team includes your primary Cardiologist (physician) and Advanced Practice Providers or APPs (Physician Assistants and Nurse Practitioners) who all work together to provide you with the care you need, when you need it.  Your next appointment:   10-12 week(s)  Provider:   Katlyn West, NP          We recommend signing up for the patient portal called MyChart.  Sign up information is provided on  this After Visit Summary.  MyChart is used to connect with patients for Virtual Visits (Telemedicine).  Patients are able to view lab/test results, encounter notes, upcoming appointments, etc.  Non-urgent messages can be sent to your provider as well.   To learn more about what you can do with MyChart, go to [email or web address].   Other Instructions Please schedule with Lipid Clinic

## 2024-01-13 NOTE — Progress Notes (Unsigned)
 Applied a 7 day Zio XT monitor to patient in the office  Court to read

## 2024-01-14 ENCOUNTER — Ambulatory Visit: Payer: Self-pay | Admitting: Cardiology

## 2024-01-14 LAB — COMPREHENSIVE METABOLIC PANEL WITH GFR
ALT: 7 IU/L (ref 0–32)
AST: 7 IU/L (ref 0–40)
Albumin: 4.3 g/dL (ref 3.9–4.9)
Alkaline Phosphatase: 179 IU/L — AB (ref 49–135)
BUN/Creatinine Ratio: 15 (ref 12–28)
BUN: 13 mg/dL (ref 8–27)
Bilirubin Total: 0.3 mg/dL (ref 0.0–1.2)
CO2: 24 mmol/L (ref 20–29)
Calcium: 9.8 mg/dL (ref 8.7–10.3)
Chloride: 103 mmol/L (ref 96–106)
Creatinine, Ser: 0.87 mg/dL (ref 0.57–1.00)
Globulin, Total: 2.6 g/dL (ref 1.5–4.5)
Glucose: 152 mg/dL — AB (ref 70–99)
Potassium: 4.1 mmol/L (ref 3.5–5.2)
Sodium: 142 mmol/L (ref 134–144)
Total Protein: 6.9 g/dL (ref 6.0–8.5)
eGFR: 73 mL/min/1.73 (ref >59)

## 2024-01-14 LAB — CBC
Hematocrit: 44.7 % (ref 34.0–46.6)
Hemoglobin: 14.1 g/dL (ref 11.1–15.9)
MCH: 27.3 pg (ref 26.6–33.0)
MCHC: 31.5 g/dL (ref 31.5–35.7)
MCV: 87 fL (ref 79–97)
Platelets: 268 x10E3/uL (ref 150–450)
RBC: 5.16 x10E6/uL (ref 3.77–5.28)
RDW: 13.6 % (ref 11.7–15.4)
WBC: 6.8 x10E3/uL (ref 3.4–10.8)

## 2024-01-15 NOTE — Telephone Encounter (Signed)
 LVM asking pt to call our office to discuss lab results. 2nd attempt

## 2024-01-27 ENCOUNTER — Other Ambulatory Visit: Payer: Self-pay | Admitting: Podiatry

## 2024-02-09 ENCOUNTER — Other Ambulatory Visit (HOSPITAL_BASED_OUTPATIENT_CLINIC_OR_DEPARTMENT_OTHER): Payer: Self-pay

## 2024-02-09 ENCOUNTER — Telehealth: Payer: Self-pay | Admitting: Cardiology

## 2024-02-09 DIAGNOSIS — R002 Palpitations: Secondary | ICD-10-CM

## 2024-02-10 NOTE — Telephone Encounter (Signed)
 Error

## 2024-02-10 NOTE — Telephone Encounter (Signed)
 Called patientx2 to review cardiac monitor results, no answer. Please see result note.

## 2024-02-15 ENCOUNTER — Encounter: Payer: Self-pay | Admitting: Radiology

## 2024-02-16 ENCOUNTER — Other Ambulatory Visit (HOSPITAL_BASED_OUTPATIENT_CLINIC_OR_DEPARTMENT_OTHER): Payer: Self-pay

## 2024-02-17 ENCOUNTER — Ambulatory Visit
Attending: Pharmacist Clinician (PhC)/ Clinical Pharmacy Specialist | Admitting: Pharmacist Clinician (PhC)/ Clinical Pharmacy Specialist

## 2024-02-17 NOTE — Progress Notes (Deleted)
 Office Visit    Patient Name: Miranda [name] Date of Encounter: 02/17/2024  Primary Care Provider:  Shelda Atlas, MD Primary Cardiologist:  Dorn Lesches, MD  Chief Complaint    Hyperlipidemia   Significant Past Medical History   CAD 2017 LCx stent, 03/2023 NSTEMI w/ stenting to LAD  AF Recently found on Zio, will need to start anticoagulation today  HTN 152/84 at last visit,   PAD rABI 0.54, lABI 0.7; high-grade segmental mid left FSA stenosis; on cilostazol         No Known Allergies  History of Present Illness    Miranda [name] is a 68 y.o. female patient of Dr Lesches, in the office today to discuss options for cholesterol management.  She was most recently seen by Katlyn West NP and wore a 7 day monitor because of palpitations.  Monitor showed episode of AF lasting 1 hr 41 min (HR avg 134).  Currently on clopidogrel  and aspirin  for stents and cilostazol  for PAD.  Information was reviewed with Dr. Lesches who agreed to stop ASA in favor of Eliquis.  She can stop cilostazol  if not helping with symptomatic relief.    Insurance Carrier: [employer] Dual Complete  Pharmacy:     [facility]:      LDL Cholesterol goal:    Current Medications:     Previously tried:    Family Hx:     Social Hx: Tobacco: Alcohol:      Diet:      Exercise:   Adherence Assessment  Do you ever forget to take your medication? [] Yes [] No  Do you ever skip doses due to side effects? [] Yes [] No  Do you have trouble affording your medicines? [] Yes [] No  Are you ever unable to pick up your medication due to transportation difficulties? [] Yes [] No  Do you ever stop taking your medications because you don't believe they are helping? [] Yes [] No  Do you check your weight daily? [] Yes [] No   Adherence strategy: ***  Barriers to obtaining medications: ***     Accessory Clinical Findings   Lab Results  Component Value Date   CHOL 159 04/11/2023   HDL 35 (L) 04/11/2023   LDLCALC 75  04/11/2023   TRIG 246 (H) 04/11/2023   CHOLHDL 4.5 04/11/2023    Lipoprotein (a)  Date/Time Value Ref Range Status  04/11/2023 03:10 AM 176.0 (H) <75.0 nmol/L Final    Comment:    (NOTE) Note:  Values greater than or equal to 75.0 nmol/L may       indicate an independent risk factor for CHD,       but must be evaluated with caution when applied       to non-Caucasian populations due to the       influence of genetic factors on Lp(a) across       ethnicities. Performed At: [facility] [address] [place], [place] [id number] Jennette Shorter MD Ey:[phone]     Lab Results  Component Value Date   ALT 7 01/13/2024   AST 7 01/13/2024   ALKPHOS 179 (H) 01/13/2024   BILITOT 0.3 01/13/2024   Lab Results  Component Value Date   CREATININE 0.87 01/13/2024   BUN 13 01/13/2024   NA 142 01/13/2024   K 4.1 01/13/2024   CL 103 01/13/2024   CO2 24 01/13/2024   Lab Results  Component Value Date   HGBA1C 8.3 (H) 04/12/2023    Home Medications    Current Outpatient Medications  Medication  Sig Dispense Refill   ACCU-CHEK GUIDE TEST test strip 1 each 3 (three) times daily.     Accu-Chek Softclix Lancets lancets 3 (three) times daily.     albuterol  (PROVENTIL  HFA;VENTOLIN  HFA) 108 (90 Base) MCG/ACT inhaler Inhale 2 puffs into the lungs every 6 (six) hours as needed for wheezing or shortness of breath.     amLODipine  (NORVASC ) 10 MG tablet Take 1 tablet (10 mg total) by mouth daily. 90 tablet 1   aspirin  EC 81 MG tablet Take 1 tablet (81 mg total) by mouth daily. Swallow whole. 90 tablet 2   atorvastatin  (LIPITOR ) 80 MG tablet TAKE 1 TABLET BY MOUTH DAILY AT 6 PM. 90 tablet 3   Blood Glucose Monitoring Suppl (ACCU-CHEK GUIDE ME) w/Device KIT 3 (three) times daily.     buPROPion  (WELLBUTRIN  XL) 300 MG 24 hr tablet Take 300 mg by mouth daily.     carvedilol  (COREG ) 12.5 MG tablet Take 1 tablet (12.5 mg total) by mouth 2 (two) times daily with a meal. 180 tablet 2   cilostazol   (PLETAL ) 50 MG tablet Take 50 mg by mouth 2 (two) times daily.     cloNIDine  (CATAPRES ) 0.1 MG tablet Take 0.1 mg by mouth at bedtime.     Continuous Glucose Sensor (DEXCOM G7 SENSOR) MISC as directed.     Cyanocobalamin  (B-12) 1000 MCG CAPS Take 1,000 mcg by mouth daily.      cyclobenzaprine  (FLEXERIL ) 10 MG tablet Take 10 mg by mouth 3 (three) times daily as needed.     diclofenac  sodium (VOLTAREN ) 1 % GEL Apply 4 g topically 4 (four) times daily. 1 Tube 0   fluconazole  (DIFLUCAN ) 150 MG tablet Take one tab PO today and repeat dose in 3 days if symptoms persist 2 tablet 0   fluticasone (FLONASE) 50 MCG/ACT nasal spray Place 2 sprays into both nostrils daily.     furosemide  (LASIX ) 20 MG tablet Take 1 tablet (20 mg total) by mouth daily. 3 tablet 0   gabapentin  (NEURONTIN ) 600 MG tablet Take 600 mg by mouth 3 (three) times daily.     JARDIANCE  25 MG TABS tablet Take 25 mg by mouth daily.     ketoconazole  (NIZORAL ) 2 % cream APPLY 1 APPLICATION TOPICALLY DAILY 60 g 0   LANTUS  SOLOSTAR 100 UNIT/ML Solostar Pen Inject 50 Units into the skin in the morning and at bedtime.     LINZESS 145 MCG CAPS capsule Take 145 mcg by mouth daily.     losartan  (COZAAR ) 25 MG tablet Take 1 tablet (25 mg total) by mouth daily. 90 tablet 3   metFORMIN  (GLUCOPHAGE ) 1000 MG tablet Take 1,000 mg by mouth 2 (two) times daily.     MOVANTIK  25 MG TABS tablet Take 25 mg by mouth daily before breakfast.   5   naloxone  (NARCAN ) nasal spray 4 mg/0.1 mL Place 0.4 mg into the nose once.     nitroGLYCERIN  (NITROSTAT ) 0.4 MG SL tablet Place 1 tablet (0.4 mg total) under the tongue every 5 (five) minutes x 3 doses as needed for chest pain. 25 tablet 2   oxyCODONE -acetaminophen  (PERCOCET) 10-325 MG tablet Take 1 tablet by mouth 4 (four) times daily as needed.     polyethylene glycol (MIRALAX  / GLYCOLAX ) packet Take 17 g by mouth daily as needed. 14 each 0   potassium chloride  (KLOR-CON  M) 10 MEQ tablet Take 10 mEq by mouth daily as  needed.     potassium chloride  (KLOR-CON ) 10 MEQ tablet  Take 1 tablet (10 mEq total) by mouth daily. (Patient taking differently: Take 10 mEq by mouth as needed.) 3 tablet 0   pregabalin  (LYRICA ) 100 MG capsule Take 1 capsule (100 mg total) by mouth 3 (three) times daily. 90 capsule 3   pregabalin  (LYRICA ) 150 MG capsule Take 150 mg by mouth 3 (three) times daily.     pregabalin  (LYRICA ) 50 MG capsule Take 1 capsule (50 mg total) by mouth 3 (three) times daily for 14 days. 42 capsule 0   ticagrelor  (BRILINTA ) 90 MG TABS tablet Take 1 tablet (90 mg total) by mouth 2 (two) times daily. 180 tablet 2   traZODone  (DESYREL ) 50 MG tablet Take 50 mg by mouth at bedtime as needed for sleep.      No current facility-administered medications for this visit.     Assessment & Plan    No problem-specific Assessment & Plan notes found for this encounter.   Lacie Landry, PharmD CPP [facility] [address]   [place], [place] [address] [phone]  02/17/2024, 11:42 AM

## 2024-02-18 ENCOUNTER — Ambulatory Visit: Admitting: Podiatry

## 2024-02-18 ENCOUNTER — Telehealth: Payer: Self-pay | Admitting: Physical Medicine and Rehabilitation

## 2024-02-18 NOTE — Telephone Encounter (Signed)
 Toy from [facility] [employer] called to make sure that we have the form for the pt's back brace. Call back number is [phone] ext [phone]

## 2024-02-25 ENCOUNTER — Telehealth: Payer: Self-pay | Admitting: Physical Medicine and Rehabilitation

## 2024-02-25 NOTE — Telephone Encounter (Signed)
 Toy from [facility] called wanting know if we received the form that was faxed to us  for Pt's back brace. Call back number is [phone]

## 2024-03-09 ENCOUNTER — Encounter: Payer: Self-pay | Admitting: *Deleted

## 2024-03-14 ENCOUNTER — Telehealth: Payer: Self-pay | Admitting: Physical Medicine and Rehabilitation

## 2024-03-14 NOTE — Telephone Encounter (Signed)
 Miranda [name] from [facility] [employer] call wanting the order for the back brace that was sent over via fax. The y are saying that the order was sent back ,but not signed. Call back number is [phone]

## 2024-03-17 ENCOUNTER — Telehealth: Payer: Self-pay | Admitting: Physical Medicine and Rehabilitation

## 2024-03-30 ENCOUNTER — Telehealth: Payer: Self-pay | Admitting: Physical Medicine and Rehabilitation

## 2024-03-30 NOTE — Telephone Encounter (Signed)
 Miranda [name] with [facility] called wanting to know if the order/prescription for the knee brace was received. Call back number is [phone] Ext [phone].

## 2024-04-25 ENCOUNTER — Emergency Department (HOSPITAL_COMMUNITY)

## 2024-04-25 ENCOUNTER — Other Ambulatory Visit: Payer: Self-pay

## 2024-04-25 ENCOUNTER — Inpatient Hospital Stay (HOSPITAL_COMMUNITY)
Admission: EM | Admit: 2024-04-25 | Discharge: 2024-04-28 | DRG: 689 | Disposition: A | Discharge status: Home / Self Care | Attending: Family Medicine | Admitting: Family Medicine

## 2024-04-25 ENCOUNTER — Ambulatory Visit: Admitting: Podiatry

## 2024-04-25 ENCOUNTER — Encounter (HOSPITAL_COMMUNITY): Payer: Self-pay

## 2024-04-25 DIAGNOSIS — E1140 Type 2 diabetes mellitus with diabetic neuropathy, unspecified: Secondary | ICD-10-CM | POA: Diagnosis present

## 2024-04-25 DIAGNOSIS — I10 Essential (primary) hypertension: Secondary | ICD-10-CM | POA: Diagnosis present

## 2024-04-25 DIAGNOSIS — Z6833 Body mass index (BMI) 33.0-33.9, adult: Secondary | ICD-10-CM

## 2024-04-25 DIAGNOSIS — G9341 Metabolic encephalopathy: Secondary | ICD-10-CM | POA: Diagnosis not present

## 2024-04-25 DIAGNOSIS — M545 Low back pain, unspecified: Secondary | ICD-10-CM | POA: Diagnosis present

## 2024-04-25 DIAGNOSIS — E1151 Type 2 diabetes mellitus with diabetic peripheral angiopathy without gangrene: Secondary | ICD-10-CM | POA: Diagnosis present

## 2024-04-25 DIAGNOSIS — M6282 Rhabdomyolysis: Secondary | ICD-10-CM | POA: Diagnosis present

## 2024-04-25 DIAGNOSIS — I110 Hypertensive heart disease with heart failure: Secondary | ICD-10-CM | POA: Diagnosis present

## 2024-04-25 DIAGNOSIS — J45909 Unspecified asthma, uncomplicated: Secondary | ICD-10-CM | POA: Diagnosis present

## 2024-04-25 DIAGNOSIS — Z955 Presence of coronary angioplasty implant and graft: Secondary | ICD-10-CM | POA: Diagnosis not present

## 2024-04-25 DIAGNOSIS — N3000 Acute cystitis without hematuria: Secondary | ICD-10-CM | POA: Diagnosis present

## 2024-04-25 DIAGNOSIS — T426X5A Adverse effect of other antiepileptic and sedative-hypnotic drugs, initial encounter: Secondary | ICD-10-CM | POA: Diagnosis present

## 2024-04-25 DIAGNOSIS — F17210 Nicotine dependence, cigarettes, uncomplicated: Secondary | ICD-10-CM | POA: Diagnosis present

## 2024-04-25 DIAGNOSIS — I5032 Chronic diastolic (congestive) heart failure: Secondary | ICD-10-CM | POA: Diagnosis present

## 2024-04-25 DIAGNOSIS — K219 Gastro-esophageal reflux disease without esophagitis: Secondary | ICD-10-CM | POA: Diagnosis present

## 2024-04-25 DIAGNOSIS — Z794 Long term (current) use of insulin: Secondary | ICD-10-CM

## 2024-04-25 DIAGNOSIS — E1165 Type 2 diabetes mellitus with hyperglycemia: Secondary | ICD-10-CM | POA: Diagnosis present

## 2024-04-25 DIAGNOSIS — Z7982 Long term (current) use of aspirin: Secondary | ICD-10-CM

## 2024-04-25 DIAGNOSIS — I252 Old myocardial infarction: Secondary | ICD-10-CM

## 2024-04-25 DIAGNOSIS — R4182 Altered mental status, unspecified: Secondary | ICD-10-CM

## 2024-04-25 DIAGNOSIS — G928 Other toxic encephalopathy: Secondary | ICD-10-CM | POA: Diagnosis present

## 2024-04-25 DIAGNOSIS — W19XXXA Unspecified fall, initial encounter: Principal | ICD-10-CM | POA: Diagnosis present

## 2024-04-25 DIAGNOSIS — F32A Depression, unspecified: Secondary | ICD-10-CM | POA: Diagnosis present

## 2024-04-25 DIAGNOSIS — E860 Dehydration: Secondary | ICD-10-CM | POA: Diagnosis present

## 2024-04-25 DIAGNOSIS — Z7902 Long term (current) use of antithrombotics/antiplatelets: Secondary | ICD-10-CM

## 2024-04-25 DIAGNOSIS — M542 Cervicalgia: Secondary | ICD-10-CM | POA: Diagnosis present

## 2024-04-25 DIAGNOSIS — E119 Type 2 diabetes mellitus without complications: Secondary | ICD-10-CM

## 2024-04-25 DIAGNOSIS — N390 Urinary tract infection, site not specified: Secondary | ICD-10-CM | POA: Diagnosis present

## 2024-04-25 DIAGNOSIS — F419 Anxiety disorder, unspecified: Secondary | ICD-10-CM | POA: Diagnosis present

## 2024-04-25 DIAGNOSIS — Z7984 Long term (current) use of oral hypoglycemic drugs: Secondary | ICD-10-CM

## 2024-04-25 DIAGNOSIS — Z720 Tobacco use: Secondary | ICD-10-CM | POA: Diagnosis present

## 2024-04-25 DIAGNOSIS — T43295A Adverse effect of other antidepressants, initial encounter: Secondary | ICD-10-CM | POA: Diagnosis present

## 2024-04-25 DIAGNOSIS — R410 Disorientation, unspecified: Secondary | ICD-10-CM

## 2024-04-25 DIAGNOSIS — E785 Hyperlipidemia, unspecified: Secondary | ICD-10-CM | POA: Diagnosis present

## 2024-04-25 DIAGNOSIS — Z8673 Personal history of transient ischemic attack (TIA), and cerebral infarction without residual deficits: Secondary | ICD-10-CM

## 2024-04-25 DIAGNOSIS — Z8249 Family history of ischemic heart disease and other diseases of the circulatory system: Secondary | ICD-10-CM

## 2024-04-25 DIAGNOSIS — R441 Visual hallucinations: Secondary | ICD-10-CM

## 2024-04-25 DIAGNOSIS — Z79899 Other long term (current) drug therapy: Secondary | ICD-10-CM

## 2024-04-25 DIAGNOSIS — M25561 Pain in right knee: Secondary | ICD-10-CM | POA: Diagnosis present

## 2024-04-25 DIAGNOSIS — G8929 Other chronic pain: Secondary | ICD-10-CM | POA: Diagnosis present

## 2024-04-25 DIAGNOSIS — R748 Abnormal levels of other serum enzymes: Secondary | ICD-10-CM

## 2024-04-25 DIAGNOSIS — I2510 Atherosclerotic heart disease of native coronary artery without angina pectoris: Secondary | ICD-10-CM | POA: Diagnosis present

## 2024-04-25 DIAGNOSIS — Y92009 Unspecified place in unspecified non-institutional (private) residence as the place of occurrence of the external cause: Secondary | ICD-10-CM | POA: Diagnosis not present

## 2024-04-25 DIAGNOSIS — E669 Obesity, unspecified: Secondary | ICD-10-CM | POA: Diagnosis present

## 2024-04-25 LAB — URINALYSIS, W/ REFLEX TO CULTURE (INFECTION SUSPECTED)
Bilirubin Urine: NEGATIVE
Glucose, UA: >=500 mg/dL — AB
Hgb urine dipstick: NEGATIVE
Ketones, ur: 80 mg/dL — AB
Nitrite: NEGATIVE
Protein, ur: 30 mg/dL — AB
Specific Gravity, Urine: 1.032 — ABNORMAL HIGH (ref 1.005–1.030)
WBC, UA: >50 WBC/hpf (ref 0–5)
pH: 5 (ref 5.0–8.0)

## 2024-04-25 LAB — BLOOD GAS, VENOUS
Acid-base deficit: 1 mmol/L (ref 0.0–2.0)
Bicarbonate: 26.6 mmol/L (ref 20.0–28.0)
O2 Saturation: 28.8 %
Patient temperature: 37
pCO2, Ven: 54 mmHg (ref 44–60)
pH, Ven: 7.3 (ref 7.25–7.43)
pO2, Ven: <31 mmHg — CL (ref 32–45)

## 2024-04-25 LAB — CBG MONITORING, ED: Glucose-Capillary: 226 mg/dL — ABNORMAL HIGH (ref 70–99)

## 2024-04-25 LAB — URINE DRUG SCREEN
Amphetamines: NEGATIVE
Barbiturates: NEGATIVE
Benzodiazepines: NEGATIVE
Cocaine: NEGATIVE
Fentanyl: NEGATIVE
Methadone Scn, Ur: NEGATIVE
Opiates: NEGATIVE
Tetrahydrocannabinol: POSITIVE — AB

## 2024-04-25 LAB — CBC WITH DIFFERENTIAL/PLATELET
Abs Immature Granulocytes: 0.03 K/uL (ref 0.00–0.07)
Basophils Absolute: 0.1 K/uL (ref 0.0–0.1)
Basophils Relative: 1 %
Eosinophils Absolute: 0 K/uL (ref 0.0–0.5)
Eosinophils Relative: 0 %
HCT: 52.5 % — ABNORMAL HIGH (ref 36.0–46.0)
Hemoglobin: 15.5 g/dL — ABNORMAL HIGH (ref 12.0–15.0)
Immature Granulocytes: 0 %
Lymphocytes Relative: 26 %
Lymphs Abs: 2.6 K/uL (ref 0.7–4.0)
MCH: 27.4 pg (ref 26.0–34.0)
MCHC: 29.5 g/dL — ABNORMAL LOW (ref 30.0–36.0)
MCV: 92.8 fL (ref 80.0–100.0)
Monocytes Absolute: 0.5 K/uL (ref 0.1–1.0)
Monocytes Relative: 5 %
Neutro Abs: 6.8 K/uL (ref 1.7–7.7)
Neutrophils Relative %: 68 %
Platelets: 377 K/uL (ref 150–400)
RBC: 5.66 MIL/uL — ABNORMAL HIGH (ref 3.87–5.11)
RDW: 15.1 % (ref 11.5–15.5)
WBC: 10.1 K/uL (ref 4.0–10.5)
nRBC: 0 % (ref 0.0–0.2)

## 2024-04-25 LAB — CK: Total CK: 350 U/L — ABNORMAL HIGH (ref 38–234)

## 2024-04-25 LAB — CBC
HCT: 46.4 % — ABNORMAL HIGH (ref 36.0–46.0)
Hemoglobin: 13.9 g/dL (ref 12.0–15.0)
MCH: 27.3 pg (ref 26.0–34.0)
MCHC: 30 g/dL (ref 30.0–36.0)
MCV: 91 fL (ref 80.0–100.0)
Platelets: 342 K/uL (ref 150–400)
RBC: 5.1 MIL/uL (ref 3.87–5.11)
RDW: 14.9 % (ref 11.5–15.5)
WBC: 8.9 K/uL (ref 4.0–10.5)
nRBC: 0 % (ref 0.0–0.2)

## 2024-04-25 LAB — BASIC METABOLIC PANEL WITH GFR
Anion gap: 20 — ABNORMAL HIGH (ref 5–15)
BUN: 29 mg/dL — ABNORMAL HIGH (ref 8–23)
CO2: 22 mmol/L (ref 22–32)
Calcium: 10.6 mg/dL — ABNORMAL HIGH (ref 8.9–10.3)
Chloride: 102 mmol/L (ref 98–111)
Creatinine, Ser: 0.89 mg/dL (ref 0.44–1.00)
GFR, Estimated: >60 mL/min
Glucose, Bld: 220 mg/dL — ABNORMAL HIGH (ref 70–99)
Potassium: 3.8 mmol/L (ref 3.5–5.1)
Sodium: 144 mmol/L (ref 135–145)

## 2024-04-25 LAB — CREATININE, SERUM
Creatinine, Ser: 1.06 mg/dL — ABNORMAL HIGH (ref 0.44–1.00)
GFR, Estimated: 57 mL/min — ABNORMAL LOW

## 2024-04-25 LAB — ETHANOL: Alcohol, Ethyl (B): <15 mg/dL

## 2024-04-25 LAB — GLUCOSE, CAPILLARY: Glucose-Capillary: 268 mg/dL — ABNORMAL HIGH (ref 70–99)

## 2024-04-25 MED ORDER — TRAZODONE HCL 50 MG PO TABS
50.0000 mg | ORAL_TABLET | Freq: Every day | ORAL | Status: DC
  Administered 2024-04-25 – 2024-04-27 (×3): 50 mg via ORAL
  Filled 2024-04-25 (×3): qty 1

## 2024-04-25 MED ORDER — SODIUM CHLORIDE 0.9 % IV BOLUS
1000.0000 mL | Freq: Once | INTRAVENOUS | Status: AC
  Administered 2024-04-25: 1000 mL via INTRAVENOUS

## 2024-04-25 MED ORDER — SODIUM CHLORIDE 0.9 % IV SOLN: 1.0000 g | Freq: Once | INTRAVENOUS | Status: DC

## 2024-04-25 MED ORDER — ACETAMINOPHEN 325 MG PO TABS
650.0000 mg | ORAL_TABLET | Freq: Four times a day (QID) | ORAL | Status: DC | PRN
  Administered 2024-04-26: 650 mg via ORAL
  Filled 2024-04-25: qty 2

## 2024-04-25 MED ORDER — ATORVASTATIN CALCIUM 40 MG PO TABS
80.0000 mg | ORAL_TABLET | Freq: Every day | ORAL | Status: DC
  Administered 2024-04-25 – 2024-04-28 (×4): 80 mg via ORAL
  Filled 2024-04-25 (×4): qty 2

## 2024-04-25 MED ORDER — ACETAMINOPHEN 650 MG RE SUPP: 650.0000 mg | Freq: Four times a day (QID) | RECTAL | Status: DC | PRN

## 2024-04-25 MED ORDER — INSULIN ASPART 100 UNIT/ML IJ SOLN
0.0000 [IU] | Freq: Every day | INTRAMUSCULAR | Status: DC
  Administered 2024-04-25: 3 [IU] via SUBCUTANEOUS
  Filled 2024-04-25: qty 3

## 2024-04-25 MED ORDER — INSULIN ASPART 100 UNIT/ML IJ SOLN: 0.0000 [IU] | Freq: Three times a day (TID) | INTRAMUSCULAR | Status: DC

## 2024-04-25 MED ORDER — OXYCODONE HCL 5 MG PO TABS
5.0000 mg | ORAL_TABLET | Freq: Four times a day (QID) | ORAL | Status: DC | PRN
  Administered 2024-04-25 – 2024-04-26 (×2): 5 mg via ORAL
  Filled 2024-04-25 (×2): qty 1

## 2024-04-25 MED ORDER — SODIUM CHLORIDE 0.9 % IV SOLN
1.0000 g | INTRAVENOUS | Status: AC
  Administered 2024-04-25 – 2024-04-27 (×3): 1 g via INTRAVENOUS
  Filled 2024-04-25 (×3): qty 10

## 2024-04-25 MED ORDER — ONDANSETRON HCL 4 MG PO TABS: 4.0000 mg | ORAL_TABLET | Freq: Four times a day (QID) | ORAL | Status: DC | PRN

## 2024-04-25 MED ORDER — ONDANSETRON HCL 4 MG/2ML IJ SOLN: 4.0000 mg | Freq: Four times a day (QID) | INTRAMUSCULAR | Status: DC | PRN

## 2024-04-25 MED ORDER — LACTATED RINGERS IV SOLN: INTRAVENOUS | Status: AC

## 2024-04-25 MED ORDER — AMLODIPINE BESYLATE 10 MG PO TABS
10.0000 mg | ORAL_TABLET | Freq: Every day | ORAL | Status: DC
  Administered 2024-04-25: 10 mg via ORAL
  Filled 2024-04-25: qty 2

## 2024-04-25 MED ORDER — OXYCODONE-ACETAMINOPHEN 5-325 MG PO TABS
1.0000 | ORAL_TABLET | Freq: Four times a day (QID) | ORAL | Status: DC | PRN
  Administered 2024-04-26: 1 via ORAL
  Filled 2024-04-25: qty 1

## 2024-04-25 MED ORDER — OXYCODONE-ACETAMINOPHEN 10-325 MG PO TABS: 1.0000 | ORAL_TABLET | Freq: Four times a day (QID) | ORAL | Status: DC | PRN

## 2024-04-25 MED ORDER — ENOXAPARIN SODIUM 40 MG/0.4ML IJ SOSY
40.0000 mg | PREFILLED_SYRINGE | INTRAMUSCULAR | Status: DC
  Administered 2024-04-25 – 2024-04-27 (×3): 40 mg via SUBCUTANEOUS
  Filled 2024-04-25 (×3): qty 0.4

## 2024-04-25 NOTE — ED Provider Notes (Signed)
 " [place] EMERGENCY DEPARTMENT AT [facility] Provider Note   CSN: [id number] Arrival date & time: 04/25/24  1248     Patient presents with: Fall, Back Pain, and Knee Pain   Miranda [name] is a 69 y.o. female.  With a history of stroke NSTEMI CAD type 2 diabetes who presents to the ED after fall.  Patient reports that she lives alone and fell around noon yesterday.  She was unable to get up under her own power.  Her home health nurse arrived to her house called 911 after finding her on the floor today.  She reports that she hurt her lower back during the fall.  She has chronic right knee pain but this is worse after the fall.  Denies head trauma loss of consciousness.  Also mentions concern of delusions as she saw numbers on her skin.  No fevers chills recent illness.  Denies drug alcohol use.     Fall  Back Pain Knee Pain Associated symptoms: back pain        Prior to Admission medications  Medication Sig Start Date End Date Taking? Authorizing Provider  ACCU-CHEK GUIDE TEST test strip 1 each 3 (three) times daily. 09/21/23   [provider]  Accu-Chek Softclix Lancets lancets 3 (three) times daily. 09/21/23   [provider]  albuterol  (PROVENTIL  HFA;VENTOLIN  HFA) 108 (90 Base) MCG/ACT inhaler Inhale 2 puffs into the lungs every 6 (six) hours as needed for wheezing or shortness of breath.    [provider]  amLODipine  (NORVASC ) 10 MG tablet Take 1 tablet (10 mg total) by mouth daily. 04/15/23   Henry Manuelita NOVAK, NP  aspirin  EC 81 MG tablet Take 1 tablet (81 mg total) by mouth daily. Swallow whole. 04/15/23   Henry Manuelita NOVAK, NP  atorvastatin  (LIPITOR ) 80 MG tablet TAKE 1 TABLET BY MOUTH DAILY AT 6 PM. 10/08/23   Court Dorn PARAS, MD  Blood Glucose Monitoring Suppl (ACCU-CHEK GUIDE ME) w/Device KIT 3 (three) times daily. 09/20/23   [provider]  buPROPion  (WELLBUTRIN  XL) 300 MG 24 hr tablet Take 300 mg by mouth daily. 03/19/23    [provider]  carvedilol  (COREG ) 12.5 MG tablet Take 1 tablet (12.5 mg total) by mouth 2 (two) times daily with a meal. 09/22/23   Court Dorn PARAS, MD  cilostazol  (PLETAL ) 50 MG tablet Take 50 mg by mouth 2 (two) times daily. 06/29/23   [provider]  cloNIDine  (CATAPRES ) 0.1 MG tablet Take 0.1 mg by mouth at bedtime. 04/17/23   [provider]  Continuous Glucose Sensor (DEXCOM G7 SENSOR) MISC as directed. 06/24/23   [provider]  Cyanocobalamin  (B-12) 1000 MCG CAPS Take 1,000 mcg by mouth daily.     [provider]  cyclobenzaprine  (FLEXERIL ) 10 MG tablet Take 10 mg by mouth 3 (three) times daily as needed. 09/28/23   [provider]  diclofenac  sodium (VOLTAREN ) 1 % GEL Apply 4 g topically 4 (four) times daily. 02/11/18   Rizwan, Saima, MD  fluconazole  (DIFLUCAN ) 150 MG tablet Take one tab PO today and repeat dose in 3 days if symptoms persist 06/15/23   Billy Asberry FALCON, PA-C  fluticasone [facility]) 50 MCG/ACT nasal spray Place 2 sprays into both nostrils daily. 10/23/23   [provider]  furosemide  (LASIX ) 20 MG tablet Take 1 tablet (20 mg total) by mouth daily. 09/05/19   Dasie Faden, MD  gabapentin  (NEURONTIN ) 600 MG tablet Take 600 mg by mouth 3 (  three) times daily. 10/06/23   [provider]  JARDIANCE  25 MG TABS tablet Take 25 mg by mouth daily. 09/15/23   [provider]  ketoconazole  (NIZORAL ) 2 % cream APPLY 1 APPLICATION TOPICALLY DAILY 01/27/24   Standiford, Marsa FALCON, DPM  LANTUS  SOLOSTAR 100 UNIT/ML Solostar Pen Inject 50 Units into the skin in the morning and at bedtime. 08/15/19   [provider]  LINZESS  145 MCG CAPS capsule Take 145 mcg by mouth daily. 01/16/23   [provider]  losartan  (COZAAR ) 25 MG tablet Take 1 tablet (25 mg total) by mouth daily. 01/13/24   West, Katlyn D, NP  metFORMIN  (GLUCOPHAGE ) 1000 MG tablet Take 1,000 mg by mouth 2 (two) times daily. 09/21/23   [provider]  MOVANTIK  25 MG TABS tablet Take 25 mg by mouth daily before breakfast.  12/12/16   [provider]  naloxone  (NARCAN ) nasal spray 4 mg/0.1 mL Place 0.4 mg into the nose once. 01/29/23   [provider]  nitroGLYCERIN  (NITROSTAT ) 0.4 MG SL tablet Place 1 tablet (0.4 mg total) under the tongue every 5 (five) minutes x 3 doses as needed for chest pain. 04/14/23   Henry Manuelita NOVAK, NP  oxyCODONE -acetaminophen  (PERCOCET) 10-325 MG tablet Take 1 tablet by mouth 4 (four) times daily as needed. 04/03/23   [provider]  polyethylene glycol (MIRALAX  / GLYCOLAX ) packet Take 17 g by mouth daily as needed. 02/11/18   Rizwan, Saima, MD  potassium chloride  (KLOR-CON  M) 10 MEQ tablet Take 10 mEq by mouth daily as needed. 10/30/23   [provider]  potassium chloride  (KLOR-CON ) 10 MEQ tablet Take 1 tablet (10 mEq total) by mouth daily. Patient taking differently: Take 10 mEq by mouth as needed. 09/05/19   Dasie Faden, MD  pregabalin  (LYRICA ) 100 MG capsule Take 1 capsule (100 mg total) by mouth 3 (three) times daily. 11/18/23 01/13/24  Christine Rush, DPM  pregabalin  (LYRICA ) 150 MG capsule Take 150 mg by mouth 3 (three) times daily. 09/28/23   [provider]  pregabalin  (LYRICA ) 50 MG capsule Take 1 capsule (50 mg total) by mouth 3 (three) times daily for 14 days. 11/18/23 01/13/24  Christine Rush, DPM  ticagrelor  (BRILINTA ) 90 MG TABS tablet Take 1 tablet (90 mg total) by mouth 2 (two) times daily. 10/23/23   Court Dorn PARAS, MD  traZODone  (DESYREL ) 50 MG tablet Take 50 mg by mouth at bedtime as needed for sleep.     [provider]    Allergies: Patient has no known allergies.    Review of Systems  Musculoskeletal:  Positive for back pain.    Updated Vital Signs BP (!) 166/83   Pulse 68   Temp 97.6 F (36.4 C) (Oral)   Resp 18   Ht 5' 10 (1.778 m)   Wt 104.3 kg   SpO2 99%   BMI 33.00 kg/m   Physical Exam Vitals and nursing note  reviewed.  HENT:     Head: Normocephalic and atraumatic.  Eyes:     Pupils: Pupils are equal, round, and reactive to light.  Cardiovascular:     Rate and Rhythm: Normal rate and regular rhythm.  Pulmonary:     Effort: Pulmonary effort is normal.     Breath sounds: Normal breath sounds.  Abdominal:     Palpations: Abdomen is soft.     Tenderness: There is no abdominal tenderness.  Musculoskeletal:     Comments: Tenderness of right knee and anterior aspect  with limited flexion secondary to pain Full active range of motion with flexion of the hips No midline tenderness step-off deformity of cervical thoracic region she does have some midline paraspinal lumbar tenderness without step-off or deformity Positive tenderness in bilateral upper extremities No sacral wound  Skin:    General: Skin is warm and dry.  Neurological:     General: No focal deficit present.     Mental Status: She is alert.     Sensory: No sensory deficit.     Motor: No weakness.  Psychiatric:        Mood and Affect: Mood normal.     (all labs ordered are listed, but only abnormal results are displayed) Labs Reviewed  BASIC METABOLIC PANEL WITH GFR - Abnormal; Notable for the following components:      Result Value   Glucose, Bld 220 (*)    BUN 29 (*)    Calcium  10.6 (*)    Anion gap 20 (*)    All other components within normal limits  CBC WITH DIFFERENTIAL/PLATELET - Abnormal; Notable for the following components:   RBC 5.66 (*)    Hemoglobin 15.5 (*)    HCT 52.5 (*)    MCHC 29.5 (*)    All other components within normal limits  CK - Abnormal; Notable for the following components:   Total CK 350 (*)    All other components within normal limits  CBG MONITORING, ED - Abnormal; Notable for the following components:   Glucose-Capillary 226 (*)    All other components within normal limits  ETHANOL  URINALYSIS, W/ REFLEX TO CULTURE (INFECTION SUSPECTED)  URINE DRUG SCREEN  BLOOD GAS, VENOUS     EKG: EKG Interpretation Date/Time:  Monday April 25 2024 15:42:28 EST Ventricular Rate:  78 PR Interval:  172 QRS Duration:  89 QT Interval:  387 QTC Calculation: 441 R Axis:   46  Text Interpretation: Sinus rhythm Borderline T abnormalities, lateral leads Confirmed by Pamella Sharper [phone]) on 04/25/2024 4:54:15 PM  Radiology: [facility] Knee Right Port Result Date: 04/25/2024 CLINICAL DATA:  Trauma EXAM: PORTABLE RIGHT KNEE - 1-2 VIEW COMPARISON:  07/14/2022 FINDINGS: No fracture or malalignment. Tricompartment arthritis of the knee with moderate severe medial and patellofemoral joint space degenerative change. No significant knee effusion IMPRESSION: No acute osseous abnormality. Tricompartment arthritis. Electronically Signed   By: Luke Bun M.D.   On: 04/25/2024 15:50   CT Cervical Spine Wo Contrast Result Date: 04/25/2024 CLINICAL DATA:  Trauma EXAM: CT CERVICAL SPINE WITHOUT CONTRAST TECHNIQUE: Multidetector CT imaging of the cervical spine was performed without intravenous contrast. Multiplanar CT image reconstructions were also generated. RADIATION DOSE REDUCTION: This exam was performed according to the departmental dose-optimization program which includes automated exposure control, adjustment of the mA and/or kV according to patient size and/or use of iterative reconstruction technique. COMPARISON:  None Available. FINDINGS: Alignment: Reversal of cervical lordosis. No subluxation. Facet alignment is within normal limits. Skull base and vertebrae: No acute fracture. No primary bone lesion or focal pathologic process. Soft tissues and spinal canal: No prevertebral fluid or swelling. No visible canal hematoma. Disc levels: Moderate severe degenerative changes C4 through C7 with diffuse disc space narrowing and osteophytes. Multilevel facet degenerative changes and foraminal narrowing. Upper chest: Mild apical emphysema. Other: Suspicion of a soft tissue mass within the midline anterior  neck soft tissues measuring 3.1 x 2.4 cm on series 9, image 71, indeterminate origin, possibly thyroid isthmus. IMPRESSION: 1. Reversal of cervical lordosis with degenerative  changes. No acute osseous abnormality. 2. Suspicion of a soft tissue mass within the midline anterior neck soft tissues measuring 3.1 x 2.4 cm, indeterminate origin, possibly thyroid isthmus. Recommend non emergent thyroid US  for initial evaluation with possible MRI follow-up depending on US  results. 3. Emphysema. Emphysema (ICD10-J43.9). Electronically Signed   By: Luke Bun M.D.   On: 04/25/2024 15:43   CT Lumbar Spine Wo Contrast Result Date: 04/25/2024 EXAM: CT OF THE LUMBAR SPINE WITHOUT CONTRAST 04/25/2024 02:34:55 PM TECHNIQUE: CT of the lumbar spine was performed without the administration of intravenous contrast. Multiplanar reformatted images are provided for review. Automated exposure control, iterative reconstruction, and/or weight based adjustment of the mA/kV was utilized to reduce the radiation dose to as low as reasonably achievable. COMPARISON: MRI lumbar spine dated 06/15/2023. CLINICAL HISTORY: Fall yesterday, found on floor, back pain. FINDINGS: BONES AND ALIGNMENT: Normal vertebral body heights. No acute fracture or suspicious bone lesion. Normal alignment, except for 2 mm degenerative anterior subluxation at L4-L5. The lowest lumbar type non-weightbearing vertebra is labeled as L5. DEGENERATIVE FINDINGS: T12-L1: Unremarkable. L1-L2: No impingement. Mild disc bulge. L2-L3: No impingement. Mild disc bulge. L3-L4: No impingement. Mild disc bulge and mild degenerative facet arthropathy. L4-L5: No impingement. Bilateral degenerative facet arthropathy and mild disc bulge. 2 mm degenerative anterior subluxation. L5-S1: No impingement. Degenerative facet arthropathy and mild disc bulge. SOFT TISSUES: Systemic atherosclerosis is present, including the aorta and iliac arteries. Atheromatous plaque noted proximally in the  superior mesenteric artery and celiac artery, patency not assessed on today's noncontrast CT of the lumbar spine. No acute abnormality. IMPRESSION: 1. No CT evidence of acute traumatic injury in the lumbar spine. 2. 2 mm degenerative anterior subluxation at L4-5. 3. Degenerative findings without overt impingement at L1-2, L2-3, L3-4, L4-5, L5-S1. 4. Chronic systemic atherosclerosis involving the aorta and iliac arteries, with atheromatous plaque in the proximal superior mesenteric and celiac arteries. Electronically signed by: Ryan Salvage MD MD 04/25/2024 03:28 PM EST RP Workstation: [id number]   CT Head Wo Contrast Result Date: 04/25/2024 CLINICAL DATA:  Head trauma found on floor EXAM: CT HEAD WITHOUT CONTRAST TECHNIQUE: Contiguous axial images were obtained from the base of the skull through the vertex without intravenous contrast. RADIATION DOSE REDUCTION: This exam was performed according to the departmental dose-optimization program which includes automated exposure control, adjustment of the mA and/or kV according to patient size and/or use of iterative reconstruction technique. COMPARISON:  CT and MRI brain 08/27/2022 FINDINGS: Brain: No acute territorial infarction, hemorrhage or intracranial mass. Chronic small vessel ischemic changes of the white matter. Small chronic infarcts within the basal ganglia and corona radiata. The ventricles are nonenlarged Vascular: No hyperdense vessels.  Carotid vascular calcification Skull: Normal. Negative for fracture or focal lesion. Sinuses/Orbits: No acute finding. Other: None IMPRESSION: 1. No CT evidence for acute intracranial abnormality. 2. Chronic small vessel ischemic changes of the white matter. Electronically Signed   By: Luke Bun M.D.   On: 04/25/2024 15:26   DG Chest Portable 1 View Result Date: 04/25/2024 CLINICAL DATA:  Found on floor EXAM: PORTABLE CHEST 1 VIEW COMPARISON:  09/28/2023 FINDINGS: The heart size and mediastinal contours are  within normal limits. Both lungs are clear. The visualized skeletal structures are unremarkable. Aortic atherosclerosis. IMPRESSION: No active disease. Electronically Signed   By: Luke Bun M.D.   On: 04/25/2024 15:22     Procedures   Medications Ordered in the ED  sodium chloride  0.9 % bolus 1,000 mL (1,000 mLs Intravenous New  Bag/Given 04/25/24 1339)    Clinical Course as of 04/25/24 1703  Mon Apr 25, 2024  1701 CT scan showed no traumatic findings.  Patient is aware of thyroid mass and tells me she had imaging taken recently.  Chest x-ray clear.  EKG without evidence of dysrhythmia.  Laboratory workup notable for mild elevation in CK3 50.  Hyperglycemia 220 with elevated anion gap may be due to early ketosis given that she has been on the ground for 24 hours but need to rule out euglycemic DKA (on Jardiance ).  Will obtain venous blood gas.  Still awaiting UA.  Patient unable to ambulate due to significant weakness  I, Ozell Marine DO, am transitioning care of this patient to the oncoming provider pending UA VBG and admission [MP]    Clinical Course User Index [MP] Marine Ozell LABOR, DO                                 Medical Decision Making 69 year old female with history as above presenting via EMS after being found down.  Fell yesterday.  Downtime on the floor at home of nearly 24 hours.  Denies head trauma loss of consciousness.  Afebrile hypertensive here.  No overt evidence of trauma on my exam.  Reporting lower back and right knee pain.  Will obtain CT head C-spine lumbar CT along with x-ray of the right knee to evaluate for traumatic injury.  Will obtain laboratory workup to look for evidence of rhabdomyolysis, kidney injury, dehydration or infection.  Chest x-ray to look for pneumonia UA to look for UTI.  Patient noting some delusions will obtain drug and alcohol screen as well  Amount and/or Complexity of Data Reviewed Labs: ordered. Radiology: ordered.        Final  diagnoses:  Fall, initial encounter  Altered mental status, unspecified altered mental status type    ED Discharge Orders     None          Marine Ozell LABOR, DO 04/25/24 1703  "

## 2024-04-25 NOTE — ED Notes (Signed)
 Pt bathed and dressed in clean gown and brief upon arrival.

## 2024-04-25 NOTE — H&P (Signed)
 " History and Physical    Patient: Miranda [name] FMW:[id number] DOB: 09/24/1955 DOA: 04/25/2024 DOS: the patient was seen and examined on 04/25/2024 PCP: Shelda Atlas, MD  Patient coming from: Home  Chief Complaint:  Chief Complaint  Patient presents with   Fall   Back Pain   Knee Pain   HPI: Miranda [name] is a 69 y.o. female with medical history significant of diabetes, hypertension, hypoglycemia, peripheral vascular disease, coronary artery disease, asthma, anxiety disorder, GERD who presents with EMS from home where home health nurse visited patient and found her on the floor.  Patient has apparently been on the floor for about 24 hours.  Has not eaten or drank anything.  She did take some cranberry juice.  Patient did not have any of her medications.  She has chronic lower back and right knee pain.  In the ER patient evaluated for the fall.  It appears to be mechanical in nature.  Patient is confused.  Was having delirium and other hallucinations.  She is seeing his things.  Ultimately patient therefore is believed to have had acute metabolic encephalopathy as a result of UTI.  Patient has a mildly elevated CK indicating early rhabdomyolysis.  Review of Systems: As mentioned in the history of present illness. All other systems reviewed and are negative. Past Medical History:  Diagnosis Date   Acid reflux    Anxiety    Asthma    CAD (coronary artery disease)    Stent to the circumflex 2017.   Degenerative disc disease    Depression    Diabetes mellitus    Hypertension    Hypoglycemia 12/30/2016   PVD (peripheral vascular disease)    Stroke (HCC)    04/22/12   Past Surgical History:  Procedure Laterality Date   CARDIAC CATHETERIZATION N/A 11/21/2015   Procedure: Left Heart Cath and Coronary Angiography;  Surgeon: Peter M Jordan, MD;  Location: [facility] INVASIVE CV LAB;  Service: Cardiovascular;  Laterality: N/A;   CARDIAC CATHETERIZATION N/A 11/21/2015   Procedure: Coronary Stent  Intervention;  Surgeon: Peter M Jordan, MD;  Location: [facility] INVASIVE CV LAB;  Service: Cardiovascular;  Laterality: N/A;   CORONARY BALLOON ANGIOPLASTY N/A 04/13/2023   Procedure: CORONARY BALLOON ANGIOPLASTY;  Surgeon: Wonda Sharper, MD;  Location: [facility] INVASIVE CV LAB;  Service: Cardiovascular;  Laterality: N/A;   CORONARY STENT INTERVENTION N/A 04/13/2023   Procedure: CORONARY STENT INTERVENTION;  Surgeon: Wonda Sharper, MD;  Location: [facility] INVASIVE CV LAB;  Service: Cardiovascular;  Laterality: N/A;   KNEE SURGERY     LEFT HEART CATH AND CORONARY ANGIOGRAPHY N/A 04/13/2023   Procedure: LEFT HEART CATH AND CORONARY ANGIOGRAPHY;  Surgeon: Wonda Sharper, MD;  Location: [facility] INVASIVE CV LAB;  Service: Cardiovascular;  Laterality: N/A;   TUBAL LIGATION     Social History:  reports that she has been smoking cigarettes. She has a 11.3 pack-year smoking history. She has never used smokeless tobacco. She reports that she does not drink alcohol and does not use drugs.  Allergies[1]  Family History  Problem Relation Age of Onset   Hypertension Other     Prior to Admission medications  Medication Sig Start Date End Date Taking? Authorizing Provider  ACCU-CHEK GUIDE TEST test strip 1 each 3 (three) times daily. 09/21/23   [provider]  Accu-Chek Softclix Lancets lancets 3 (three) times daily. 09/21/23   [provider]  albuterol  (PROVENTIL  HFA;VENTOLIN  HFA) 108 (90 Base) MCG/ACT inhaler Inhale 2 puffs into the lungs every  6 (six) hours as needed for wheezing or shortness of breath.    [provider]  amLODipine  (NORVASC ) 10 MG tablet Take 1 tablet (10 mg total) by mouth daily. 04/15/23   Henry Manuelita NOVAK, NP  aspirin  EC 81 MG tablet Take 1 tablet (81 mg total) by mouth daily. Swallow whole. 04/15/23   Henry Manuelita NOVAK, NP  atorvastatin  (LIPITOR ) 80 MG tablet TAKE 1 TABLET BY MOUTH DAILY AT 6 PM. 10/08/23   Court Dorn PARAS, MD  Blood Glucose Monitoring Suppl (ACCU-CHEK  GUIDE ME) w/Device KIT 3 (three) times daily. 09/20/23   [provider]  buPROPion  (WELLBUTRIN  XL) 300 MG 24 hr tablet Take 300 mg by mouth daily. 03/19/23   [provider]  carvedilol  (COREG ) 12.5 MG tablet Take 1 tablet (12.5 mg total) by mouth 2 (two) times daily with a meal. 09/22/23   Court Dorn PARAS, MD  cilostazol  (PLETAL ) 50 MG tablet Take 50 mg by mouth 2 (two) times daily. 06/29/23   [provider]  cloNIDine  (CATAPRES ) 0.1 MG tablet Take 0.1 mg by mouth at bedtime. 04/17/23   [provider]  Continuous Glucose Sensor (DEXCOM G7 SENSOR) MISC as directed. 06/24/23   [provider]  Cyanocobalamin  (B-12) 1000 MCG CAPS Take 1,000 mcg by mouth daily.     [provider]  cyclobenzaprine  (FLEXERIL ) 10 MG tablet Take 10 mg by mouth 3 (three) times daily as needed. 09/28/23   [provider]  diclofenac  sodium (VOLTAREN ) 1 % GEL Apply 4 g topically 4 (four) times daily. 02/11/18   Rizwan, Saima, MD  fluconazole  (DIFLUCAN ) 150 MG tablet Take one tab PO today and repeat dose in 3 days if symptoms persist 06/15/23   Billy Asberry FALCON, PA-C  fluticasone [facility]) 50 MCG/ACT nasal spray Place 2 sprays into both nostrils daily. 10/23/23   [provider]  furosemide  (LASIX ) 20 MG tablet Take 1 tablet (20 mg total) by mouth daily. 09/05/19   Dasie Faden, MD  gabapentin  (NEURONTIN ) 600 MG tablet Take 600 mg by mouth 3 (three) times daily. 10/06/23   [provider]  JARDIANCE  25 MG TABS tablet Take 25 mg by mouth daily. 09/15/23   [provider]  ketoconazole  (NIZORAL ) 2 % cream APPLY 1 APPLICATION TOPICALLY DAILY 01/27/24   Standiford, Marsa FALCON, DPM  LANTUS  SOLOSTAR 100 UNIT/ML Solostar Pen Inject 50 Units into the skin in the morning and at bedtime. 08/15/19   [provider]  LINZESS  145 MCG CAPS capsule Take 145 mcg by mouth daily. 01/16/23   [provider]  losartan  (COZAAR ) 25 MG tablet Take 1  tablet (25 mg total) by mouth daily. 01/13/24   West, Katlyn D, NP  metFORMIN  (GLUCOPHAGE ) 1000 MG tablet Take 1,000 mg by mouth 2 (two) times daily. 09/21/23   [provider]  MOVANTIK  25 MG TABS tablet Take 25 mg by mouth daily before breakfast.  12/12/16   [provider]  naloxone  (NARCAN ) nasal spray 4 mg/0.1 mL Place 0.4 mg into the nose once. 01/29/23   [provider]  nitroGLYCERIN  (NITROSTAT ) 0.4 MG SL tablet Place 1 tablet (0.4 mg total) under the tongue every 5 (five) minutes x 3 doses as needed for chest pain. 04/14/23   Henry Manuelita NOVAK, NP  oxyCODONE -acetaminophen  (PERCOCET) 10-325 MG tablet Take 1 tablet by mouth 4 (four) times daily as needed. 04/03/23   [provider]  polyethylene glycol (MIRALAX  / GLYCOLAX ) packet Take 17 g by mouth daily as  needed. 02/11/18   Rizwan, Saima, MD  potassium chloride  (KLOR-CON  M) 10 MEQ tablet Take 10 mEq by mouth daily as needed. 10/30/23   [provider]  potassium chloride  (KLOR-CON ) 10 MEQ tablet Take 1 tablet (10 mEq total) by mouth daily. Patient taking differently: Take 10 mEq by mouth as needed. 09/05/19   Dasie Faden, MD  pregabalin  (LYRICA ) 100 MG capsule Take 1 capsule (100 mg total) by mouth 3 (three) times daily. 11/18/23 01/13/24  Christine Rush, DPM  pregabalin  (LYRICA ) 150 MG capsule Take 150 mg by mouth 3 (three) times daily. 09/28/23   [provider]  pregabalin  (LYRICA ) 50 MG capsule Take 1 capsule (50 mg total) by mouth 3 (three) times daily for 14 days. 11/18/23 01/13/24  Christine Rush, DPM  ticagrelor  (BRILINTA ) 90 MG TABS tablet Take 1 tablet (90 mg total) by mouth 2 (two) times daily. 10/23/23   Court Dorn PARAS, MD  traZODone  (DESYREL ) 50 MG tablet Take 50 mg by mouth at bedtime as needed for sleep.     [provider]    Physical Exam: Vitals:   04/25/24 1300 04/25/24 1306 04/25/24 1310 04/25/24 1704  BP:   (!) 166/83 (!) 168/75  Pulse:   68 75  Resp:   18 17   Temp:   97.6 F (36.4 C) 97.9 F (36.6 C)  TempSrc:   Oral Oral  SpO2: 99%  99% 98%  Weight:  104.3 kg    Height:  5' 10 (1.778 m)     Constitutional: Chronically ill looking, NAD, calm, comfortable Eyes: PERRL, lids and conjunctivae normal ENMT: Mucous membranes are dry. Posterior pharynx clear of any exudate or lesions.Normal dentition.  Neck: normal, supple, no masses, no thyromegaly Respiratory: clear to auscultation bilaterally, no wheezing, no crackles. Normal respiratory effort. No accessory muscle use.  Cardiovascular: Regular rate and rhythm, no murmurs / rubs / gallops. No extremity edema. 2+ pedal pulses. No carotid bruits.  Abdomen: no tenderness, no masses palpated. No hepatosplenomegaly. Bowel sounds positive.  Musculoskeletal: Good range of motion, no joint swelling or tenderness, Skin: no rashes, lesions, ulcers. No induration Neurologic: CN 2-12 grossly intact. Sensation intact, DTR normal. Strength 5/5 in all 4.  Psychiatric: Confused, hallucinating, weak,  Data Reviewed:  Temperature 98.4, blood pressure 168/75.  BUN 29 creatinine 1.06 and calcium  10.6.  Glucose 220.  Urinalysis shows glucosuria.  Urine drug screen is positive for THC chest x-ray showed no active disease x-ray of the right proximal shows no admission.  CT cervical spine showed reversal cervical lordosis.  There is suspicion for soft tissue nodule in the midline anterior neck and emphysema CT of the lumbar spine showed no evidence of acute traumatic injury there is degenerative findings at L1-2 and L2-3 chronic systemic atherosclerosis  Assessment and Plan:  #1 acute metabolic encephalopathy: Suspected due to UTI.  Could also be other medications.  Patient will be admitted for management.  Treat underlying causes.  Assess patient's medications.  #2 UTI: Initiate IV Rocephin .  Urine cultures will be followed.  #3 type 2 diabetes: Non-insulin -dependent.  Initiate sliding scale insulin .  #4 essential  hypertension: Patient's blood pressure is elevated.  Will initiate treatment.  #5 coronary artery disease: Stable at baseline  #6 tobacco abuse: Counseling on tobacco cessation possible nicotine  patch.  #7 hyperlipidemia: Continue with statin  #8 history of CVA: No residual weakness    Advance Care Planning:   Code Status: Full Code   Consults: None  Family Communication: No family  at bedside  Severity of Illness: The appropriate patient status for this patient is INPATIENT. Inpatient status is judged to be reasonable and necessary in order to provide the required intensity of service to ensure the patient's safety. The patient's presenting symptoms, physical exam findings, and initial radiographic and laboratory data in the context of their chronic comorbidities is felt to place them at high risk for further clinical deterioration. Furthermore, it is not anticipated that the patient will be medically stable for discharge from the hospital within 2 midnights of admission.   * I certify that at the point of admission it is my clinical judgment that the patient will require inpatient hospital care spanning beyond 2 midnights from the point of admission due to high intensity of service, high risk for further deterioration and high frequency of surveillance required.*  AuthorBETHA SIM KNOLL, MD 04/25/2024 7:15 PM  For on call review [email or web address].      [1] No Known Allergies  "

## 2024-04-25 NOTE — ED Notes (Signed)
 Attempted to ambulate pt with a walker (pt uses walker at home), pt was able to stand up at the side of the bed but unable to take any steps forward. Pt stated dizziness and sat back down. MD notified verbally

## 2024-04-25 NOTE — ED Provider Notes (Signed)
 Care assumed from Dr. Pamella.  At time of transfer of care, patient is awaiting workup to complete with urinalysis and VBG prior to admission for acute delirium with hallucinations and fatigue and weakness.  Patient cannot safely ambulate like she normally does as she lives alone.  She was on the ground for a significant time and does have some elevation in her CK.  Anticipate admission after workup is completed.  6:44 PM Urinalysis has returned with evidence of leukocytes and bacteria.  Patient also appears dehydrated with ketones elevated.  Patient does tell me that she has urinary frequency and it has been foul-smelling and she is worried about UTI.  Symptoms and consistent UTI and given her delirium hallucinations, will call for admission per previous plan.  Patient describes hallucinations as if she is seeing numbers under her skin.  Will give IV antibiotics and call for admission for fatigue weakness and acute delirium in the setting of UTI and a fall.   Clinical Impression: 1. Fall, initial encounter   2. Altered mental status, unspecified altered mental status type   3. Acute cystitis without hematuria   4. Delirium   5. Visual hallucinations   6. Elevated CK     Disposition: Admit  This note was prepared with assistance of Dragon voice recognition software. Occasional wrong-word or sound-a-like substitutions may have occurred due to the inherent limitations of voice recognition software.       Mike Berntsen, Lonni PARAS, MD 04/25/24 [phone]

## 2024-04-25 NOTE — ED Triage Notes (Signed)
 Pt BIB ems after a fall yesterday. Pt has a home health nurse that visits her everyday, she found her on the floor, states been on the floor for 24 hrs, had nothing to eat besides cranberry juice, has had her daily meds either. Did not hit her head, no loc, on blood thinners. Has chronic lower back and R knee pain. Hx of DM. A&Ox4

## 2024-04-26 LAB — COMPREHENSIVE METABOLIC PANEL WITH GFR
ALT: 16 U/L (ref 0–44)
AST: 26 U/L (ref 15–41)
Albumin: 3.6 g/dL (ref 3.5–5.0)
Alkaline Phosphatase: 149 U/L — ABNORMAL HIGH (ref 38–126)
Anion gap: 10 (ref 5–15)
BUN: 35 mg/dL — ABNORMAL HIGH (ref 8–23)
CO2: 25 mmol/L (ref 22–32)
Calcium: 9.4 mg/dL (ref 8.9–10.3)
Chloride: 108 mmol/L (ref 98–111)
Creatinine, Ser: 0.93 mg/dL (ref 0.44–1.00)
GFR, Estimated: >60 mL/min
Glucose, Bld: 137 mg/dL — ABNORMAL HIGH (ref 70–99)
Potassium: 3.5 mmol/L (ref 3.5–5.1)
Sodium: 142 mmol/L (ref 135–145)
Total Bilirubin: 0.4 mg/dL (ref 0.0–1.2)
Total Protein: 6.4 g/dL — ABNORMAL LOW (ref 6.5–8.1)

## 2024-04-26 LAB — GLUCOSE, CAPILLARY
Glucose-Capillary: 154 mg/dL — ABNORMAL HIGH (ref 70–99)
Glucose-Capillary: 177 mg/dL — ABNORMAL HIGH (ref 70–99)
Glucose-Capillary: 247 mg/dL — ABNORMAL HIGH (ref 70–99)
Glucose-Capillary: 271 mg/dL — ABNORMAL HIGH (ref 70–99)
Glucose-Capillary: 471 mg/dL — ABNORMAL HIGH (ref 70–99)

## 2024-04-26 LAB — CBC
HCT: 44.4 % (ref 36.0–46.0)
Hemoglobin: 13.5 g/dL (ref 12.0–15.0)
MCH: 27.4 pg (ref 26.0–34.0)
MCHC: 30.4 g/dL (ref 30.0–36.0)
MCV: 90.1 fL (ref 80.0–100.0)
Platelets: 287 K/uL (ref 150–400)
RBC: 4.93 MIL/uL (ref 3.87–5.11)
RDW: 15 % (ref 11.5–15.5)
WBC: 8.3 K/uL (ref 4.0–10.5)
nRBC: 0 % (ref 0.0–0.2)

## 2024-04-26 LAB — HEMOGLOBIN A1C
Hgb A1c MFr Bld: 9.4 % — ABNORMAL HIGH (ref 4.8–5.6)
Mean Plasma Glucose: 223.08 mg/dL

## 2024-04-26 LAB — HIV ANTIBODY (ROUTINE TESTING W REFLEX): HIV Screen 4th Generation wRfx: NONREACTIVE

## 2024-04-26 MED ORDER — SODIUM CHLORIDE 0.9 % IV BOLUS
500.0000 mL | Freq: Once | INTRAVENOUS | Status: AC
  Administered 2024-04-26: 500 mL via INTRAVENOUS

## 2024-04-26 MED ORDER — LINACLOTIDE 145 MCG PO CAPS
145.0000 ug | ORAL_CAPSULE | Freq: Every day | ORAL | Status: DC
  Administered 2024-04-26 – 2024-04-28 (×3): 145 ug via ORAL
  Filled 2024-04-26 (×3): qty 1

## 2024-04-26 MED ORDER — INSULIN ASPART 100 UNIT/ML IJ SOLN
0.0000 [IU] | Freq: Three times a day (TID) | INTRAMUSCULAR | Status: DC
  Administered 2024-04-26 (×2): 4 [IU] via SUBCUTANEOUS
  Administered 2024-04-27: 11 [IU] via SUBCUTANEOUS
  Administered 2024-04-27: 4 [IU] via SUBCUTANEOUS
  Administered 2024-04-28: 11 [IU] via SUBCUTANEOUS
  Administered 2024-04-28: 3 [IU] via SUBCUTANEOUS
  Filled 2024-04-26: qty 3
  Filled 2024-04-26: qty 4
  Filled 2024-04-26: qty 11
  Filled 2024-04-26: qty 4
  Filled 2024-04-26: qty 11
  Filled 2024-04-26: qty 4

## 2024-04-26 MED ORDER — INSULIN ASPART 100 UNIT/ML IJ SOLN
0.0000 [IU] | Freq: Every day | INTRAMUSCULAR | Status: DC
  Administered 2024-04-26: 2 [IU] via SUBCUTANEOUS
  Filled 2024-04-26: qty 2

## 2024-04-26 MED ORDER — INSULIN ASPART 100 UNIT/ML IJ SOLN
6.0000 [IU] | Freq: Three times a day (TID) | INTRAMUSCULAR | Status: DC
  Administered 2024-04-26 – 2024-04-28 (×6): 6 [IU] via SUBCUTANEOUS
  Filled 2024-04-26 (×6): qty 6

## 2024-04-26 MED ORDER — NALOXEGOL OXALATE 25 MG PO TABS: 25.0000 mg | ORAL_TABLET | Freq: Every day | ORAL | Status: DC

## 2024-04-26 MED ORDER — INSULIN GLARGINE-YFGN 100 UNIT/ML ~~LOC~~ SOLN
10.0000 [IU] | Freq: Two times a day (BID) | SUBCUTANEOUS | Status: DC
  Administered 2024-04-26 – 2024-04-28 (×5): 10 [IU] via SUBCUTANEOUS
  Filled 2024-04-26 (×6): qty 0.1

## 2024-04-26 MED ORDER — ASPIRIN 81 MG PO TBEC
81.0000 mg | DELAYED_RELEASE_TABLET | Freq: Every day | ORAL | Status: DC
  Administered 2024-04-26 – 2024-04-28 (×3): 81 mg via ORAL
  Filled 2024-04-26 (×3): qty 1

## 2024-04-26 MED ORDER — TICAGRELOR 90 MG PO TABS: 90.0000 mg | ORAL_TABLET | Freq: Two times a day (BID) | ORAL | Status: DC

## 2024-04-26 MED ORDER — INSULIN ASPART 100 UNIT/ML IJ SOLN
20.0000 [IU] | Freq: Once | INTRAMUSCULAR | Status: AC
  Administered 2024-04-26: 20 [IU] via SUBCUTANEOUS
  Filled 2024-04-26: qty 20

## 2024-04-26 MED ORDER — CILOSTAZOL 100 MG PO TABS: 50.0000 mg | ORAL_TABLET | Freq: Two times a day (BID) | ORAL | Status: DC

## 2024-04-26 NOTE — Plan of Care (Signed)
" °  Problem: Education: Goal: Ability to describe self-care measures that may prevent or decrease complications (Diabetes Survival Skills Education) will improve Outcome: Not Progressing   Problem: Health Behavior/Discharge Planning: Goal: Ability to manage health-related needs will improve Outcome: Not Progressing   Problem: Nutritional: Goal: Maintenance of adequate nutrition will improve Outcome: Not Progressing   "

## 2024-04-26 NOTE — Progress Notes (Signed)
 [facility] Progress Note Patient: Miranda [name] FMW:[id number] DOB: [birth year]  DOA: 04/25/2024 DOS: the patient was seen and examined on 04/26/2024  Brief Hospital Course: Patient with PMH of T2DM, HTN, PVD, CAD, anxiety, GERD, obesity presents to the hospital with complaints of fall and neck pain and back pain. Patient was brought in by EMS.  Home health nurse went to check on the patient and found the patient on the floor.  Patient reports that she may have on the floor for 24 hours. Currently concern for possible UTI versus polypharmacy.  Assessment and Plan: Acute metabolic encephalopathy. Reported to be suspecting from UTI. Currently on IV antibiotic. Unsure if that is the etiology.  Will monitor for now. Will send out further metabolic workup. Suspect this is related to polypharmacy as well. Currently holding patient's psychotropic medications.  Neuropathy with chronic pain Patient has history of neuropathy.  Patient is having gabapentin  600 mg 3 times daily from 1 provider and Lyrica  150 mg 3 times daily from 1 provider.  As well as Percocet 10/325 4 times daily. Suspect this might have something to do with polypharmacy. For now holding gabapentin  and Lyrica . Continue pain medication.  Type 2 diabetes mellitus, uncontrolled with hyperglycemia with long-term insulin  use with neuropathy. Blood sugar significantly elevated. Currently on sliding scale insulin  as well as long-acting. Appears to be improving already.  History of CAD.  CVA. Continue aspirin .  History of HLD. Declined statin.  Active smoker but Recommended to quit smoking.  HTN. Blood pressure is elevated. Currently being treated.  Obesity. Body mass index is 33 kg/m.  Placing the patient at high risk of poor outcome. VBG does not show any significant hypercarbia.  Subjective: Patient drowsy but appears to be interactive.  No nausea no vomiting no fever no chills.  Denies any acute complaint  other than feeling fatigued.  Reports that she had a fall.  With neck pain.  Physical Exam: Clear to auscultation. S1-S2 present. No asterixis. Finger-nose-finger normal. Bilateral equal strength. Pupils are equal and round and reactive to light. Oral mucosa is clear. Edema noted on both lower extremity.  Data Reviewed: I have Reviewed nursing notes, Vitals, and Lab results. Since last encounter, pertinent lab results CBC and BMP   . I have ordered test including CBC and BMP  .    Disposition: Status is: Inpatient Remains inpatient appropriate because: Monitor for improvement in mentation.  enoxaparin  (LOVENOX ) injection 40 mg Start: 04/25/24 2200   Family Communication: No one bedside. Level of care: Med-Surg   Vitals:   04/25/24 2036 04/26/24 0054 04/26/24 0451 04/26/24 1339  BP: 136/62 (!) 117/56 111/63 129/67  Pulse: 92 76 78 90  Resp:   17 16  Temp: 98.4 F (36.9 C) 97.6 F (36.4 C) 98.8 F (37.1 C) (!) 97.5 F (36.4 C)  TempSrc: Oral  Oral   SpO2: 97% 92% 90% 97%  Weight:      Height:         Author: Yetta Blanch, MD 04/26/2024 6:42 PM  Please look on [email or web address] to find out who is on call.

## 2024-04-26 NOTE — Progress Notes (Signed)
 Date and time results received: 04/26/2024 0730   Test: CBG Critical Value: 471  Name of Provider Notified: Yetta Blanch, MD  Orders Received? Or Actions Taken?: Orders Received - See Orders for details

## 2024-04-27 DIAGNOSIS — G9341 Metabolic encephalopathy: Secondary | ICD-10-CM | POA: Diagnosis not present

## 2024-04-27 LAB — BASIC METABOLIC PANEL WITH GFR
Anion gap: 13 (ref 5–15)
BUN: 23 mg/dL (ref 8–23)
CO2: 21 mmol/L — ABNORMAL LOW (ref 22–32)
Calcium: 9.1 mg/dL (ref 8.9–10.3)
Chloride: 112 mmol/L — ABNORMAL HIGH (ref 98–111)
Creatinine, Ser: 0.73 mg/dL (ref 0.44–1.00)
GFR, Estimated: >60 mL/min
Glucose, Bld: 155 mg/dL — ABNORMAL HIGH (ref 70–99)
Potassium: 3.5 mmol/L (ref 3.5–5.1)
Sodium: 146 mmol/L — ABNORMAL HIGH (ref 135–145)

## 2024-04-27 LAB — CBC
HCT: 45 % (ref 36.0–46.0)
Hemoglobin: 13.9 g/dL (ref 12.0–15.0)
MCH: 27.7 pg (ref 26.0–34.0)
MCHC: 30.9 g/dL (ref 30.0–36.0)
MCV: 89.8 fL (ref 80.0–100.0)
Platelets: 285 K/uL (ref 150–400)
RBC: 5.01 MIL/uL (ref 3.87–5.11)
RDW: 14.8 % (ref 11.5–15.5)
WBC: 7.2 K/uL (ref 4.0–10.5)
nRBC: 0 % (ref 0.0–0.2)

## 2024-04-27 LAB — MAGNESIUM: Magnesium: 2.3 mg/dL (ref 1.7–2.4)

## 2024-04-27 LAB — GLUCOSE, CAPILLARY
Glucose-Capillary: 295 mg/dL — ABNORMAL HIGH (ref 70–99)
Glucose-Capillary: 89 mg/dL (ref 70–99)
Glucose-Capillary: 171 mg/dL — ABNORMAL HIGH (ref 70–99)
Glucose-Capillary: 210 mg/dL — ABNORMAL HIGH (ref 70–99)

## 2024-04-27 MED ORDER — BUPROPION HCL 100 MG PO TABS
100.0000 mg | ORAL_TABLET | Freq: Every day | ORAL | Status: DC
  Administered 2024-04-27: 100 mg via ORAL
  Filled 2024-04-27 (×2): qty 1

## 2024-04-27 MED ORDER — GABAPENTIN 300 MG PO CAPS
300.0000 mg | ORAL_CAPSULE | Freq: Two times a day (BID) | ORAL | Status: DC
  Administered 2024-04-27 – 2024-04-28 (×3): 300 mg via ORAL
  Filled 2024-04-27 (×3): qty 1

## 2024-04-27 MED ORDER — OXYCODONE-ACETAMINOPHEN 5-325 MG PO TABS
1.0000 | ORAL_TABLET | Freq: Four times a day (QID) | ORAL | Status: DC | PRN
  Administered 2024-04-28: 1 via ORAL
  Filled 2024-04-27: qty 1

## 2024-04-27 NOTE — Evaluation (Signed)
 Physical Therapy Evaluation Patient Details Name: Miranda [name] MRN: [id number] DOB: 04-29-1955 Today's Date: 04/27/2024  History of Present Illness  69 y.o. female  who presents via EMS from home where home health nurse visited patient and found her on the floor. admitted with acute metabolic encephalopathy as a result of UTI.  Patient also with  mildly elevated CK indicating early rhabdomyolysis. CT no evidence of traumatic injury in lumbar spine with degenerative subluxation L4-5 and degenerative changesPMH:  diabetes, hypertension, hypoglycemia, peripheral vascular disease, coronary artery disease, asthma, anxiety disorder, GERD,  chronic lower back and right knee pain  Clinical Impression  Pt admitted with above diagnosis.  Pt endorses sedentary lifestyle at baseline, lives in 2nd level apt, amb with rollator. Pt initially very sleepy. With multi-modal stim pt eventually aroused and was able to follow commands, Ox4.   Pt requiring min assist for transfers this session, progressing to CGA on later STS/SPT trials. Pt declines amb d/t back pain which is chronic in nature. Pt will likely benefit from HHPT at d/c. Continue to follow in acute setting  Pt currently with functional limitations due to the deficits listed below (see PT Problem List). Pt will benefit from acute skilled PT to increase their independence and safety with mobility to allow discharge.           If plan is discharge home, recommend the following: Help with stairs or ramp for entrance   Can travel by private vehicle        Equipment Recommendations Rollator (4 wheels) (pt requests new rollator)  Recommendations for Other Services       Functional Status Assessment Patient has had a recent decline in their functional status and demonstrates the ability to make significant improvements in function in a reasonable and predictable amount of time.     Precautions / Restrictions Precautions Precautions:  Fall Restrictions Weight Bearing Restrictions Per Provider Order: No      Mobility  Bed Mobility Overal bed mobility: Needs Assistance Bed Mobility: Rolling, Sidelying to Sit Rolling: Min assist Sidelying to sit: Min assist       General bed mobility comments: multi-modal cues to sequence and self assist    Transfers Overall transfer level: Needs assistance Equipment used: Rolling walker (2 wheels) Transfers: Sit to/from Stand, Bed to chair/wheelchair/BSC Sit to Stand: Min assist, Contact guard assist, +2 safety/equipment   Step pivot transfers: Min assist       General transfer comment: multi-modal cues for hand placement, to power up with LEs. incr time needed. assist to rise, steady, min assist for intial STS and SPT, CGA 2nd trial of STS/SPT    Ambulation/Gait               General Gait Details: a few small steps with RW and min assist  Stairs            Wheelchair Mobility     Tilt Bed    Modified Rankin (Stroke Patients Only)       Balance Overall balance assessment: Needs assistance Sitting-balance support: Feet supported, No upper extremity supported Sitting balance-Leahy Scale: Fair Sitting balance - Comments: not challenged   Standing balance support: During functional activity, Reliant on assistive device for balance Standing balance-Leahy Scale: Poor                               Pertinent Vitals/Pain Pain Assessment Pain Assessment: Faces Faces Pain Scale: Hurts even  more Pain Location: back, knees, hips Pain Descriptors / Indicators: Grimacing, Sore Pain Intervention(s): Limited activity within patient's tolerance, Monitored during session, Repositioned    Home Living Family/patient expects to be discharged to:: Private residence Living Arrangements: Alone   Type of Home: Apartment Home Access: Stairs to enter   [employer] of Steps: flight  to 2nd floor apt   Home Layout: One level Home  Equipment: Rollator (4 wheels)      Prior Function Prior Level of Function : Independent/Modified Independent;Driving             Mobility Comments: pt reports sedentary lifestyle-stays in bed a lot, orders groceries/food for delivery. states she drives to appointments. ambulates with rollator       Extremity/Trunk Assessment   Upper Extremity Assessment Upper Extremity Assessment: Overall WFL for tasks assessed    Lower Extremity Assessment Lower Extremity Assessment: Overall WFL for tasks assessed       Communication   Communication Communication: No apparent difficulties (sleepy, slow to repond at times, more interactive EOS)    Cognition Arousal: Alert Behavior During Therapy: Flat affect   PT - Cognitive impairments: Initiation                       PT - Cognition Comments: slow processing, delayed initiation however improved during session Following commands: Intact       Cueing Cueing Techniques: Verbal cues, Tactile cues     General Comments      Exercises     Assessment/Plan    PT Assessment Patient needs continued PT services  PT Problem List Decreased strength;Decreased mobility;Decreased activity tolerance;Decreased balance;Decreased knowledge of use of DME       PT Treatment Interventions DME instruction;Therapeutic exercise;Gait training;Functional mobility training;Therapeutic activities;Patient/family education;Stair training    PT Goals (Current goals can be found in the Care Plan section)  Acute Rehab PT Goals Patient Stated Goal: none stated PT Goal Formulation: With patient Time For Goal Achievement: 05/11/24 Potential to Achieve Goals: Good    Frequency Min 3X/week     Co-evaluation               AM-PAC PT 6 Clicks Mobility  Outcome Measure Help needed turning from your back to your side while in a flat bed without using bedrails?: A Little Help needed moving from lying on your back to sitting on the  side of a flat bed without using bedrails?: A Little Help needed moving to and from a bed to a chair (including a wheelchair)?: A Little Help needed standing up from a chair using your arms (e.g., wheelchair or bedside chair)?: A Little Help needed to walk in hospital room?: A Little Help needed climbing 3-5 steps with a railing? : A Little 6 Click Score: 18    End of Session Equipment Utilized During Treatment: Gait belt Activity Tolerance: Patient tolerated treatment well Patient left: with call bell/phone within reach;in chair;with chair alarm set Nurse Communication: Mobility status PT Visit Diagnosis: Other abnormalities of gait and mobility (R26.89);History of falling (Z91.81)    Time: 1442-1500 PT Time Calculation (min) (ACUTE ONLY): 18 min   Charges:   PT Evaluation $PT Eval Low Complexity: 1 Low   PT General Charges $$ ACUTE PT VISIT: 1 Visit         Wana Mount, PT  Acute Rehab Dept [facility]) [phone]  04/27/2024   [facility] 04/27/2024, 3:13 PM

## 2024-04-27 NOTE — Progress Notes (Signed)
 TRH  OTIE HEADLEE FMW:[id number]  DOB: 06-04-1955  DOA: 04/25/2024  PCP: Shelda Atlas, MD  04/27/2024,12:30 PM  LOS: 2 days    Code Status: Full code     from: Home   69 year old female NSTEMI with PCI to LAD D1 and stenting 04/13/23 supposed to previously be on 1 year DAPT--- had a previous NSTEMI 2017 additionally with stent placement previous pancreatitis 2019 HFpEF Previous tobacco abuse Prior acute left lenticulostriate CVA 2014  04/25/2022 presented from home with a fall found on the floor had nothing to eat besides cranberry juice and oral meds unable to get up she also seemed delusional --saw  numbers on her skin--- no illicit drug use--- given a bolus of fluids-CT scan cervical spine reversal of cervical lordosis-CT head no acute intracranial abnormality-CT lumbar no CT evidence of traumatic injury in lumbar spine with degenerative subluxation L4-5 and degenerative changes Coincidentally 3.1 X2.4 indeterminate soft tissue mass possibly thyroid isthmus was found She had mildly elevated CK and was hyperglycemic  She was admitted for acute metabolic encephalopathy presumed secondary to UTI-noted to also have severe poly pharmacy in a setting of taking both Lyrica  and Gabapentin  as well as high-dose bupropion     Assessment  & Plan :    Toxic metabolic encephalopathy on arrival Polypharmacy versus infection Unclear if withdrawal from various meds such as bupropion  and gabapentin  etc. versus others Lower but resume bupropion  100 every day--- hold gabapentin  600 twice daily, Flexeril  10 twice daily-apparently was also on Lyrica  which should be stopped--- was also on Percocet 10/325 Will start Percocet 5/325 resume gabapentin  at 300 twice daily given back pain and get therapy eval Urine analysis leukocytes but negative nitrites urine culture never obtained-continuing Rocephin  1 g every 24 and stop after 3 doses THC was positive on drug screen  NSTEMI HFpEF HTN Amlodipine  5, Coreg   12.5 twice daily, losartan  25 all held because sleepy-holding clonidine  0.1-resume slowly when more awake  Previous CVA 2014 Administer aspirin  as able  Diabetes mellitus type 2 with neuropathy Jardiance  25 held Lantus  60 twice daily metformin  1000 twice daily at home Here the CBGs are ranging 89-2 96 She is not eating-continue resistant sliding scale discontinue at bedtime give 6 units 3 times daily meals and 10 units of long-acting twice daily  Data Reviewed today: Sodium 146 potassium 3.5 CO2 21 BUN/creatinine 23/0.7 WBC 7.2 hemoglobin 13.9 platelet 285   DVT prophylaxis: Lovenox    Dispo/Global plan: Therapy eval and then likely discharge home with home health if they clear her     Subjective:   Sleepy when I arrived into the room answering in full sentences coherent knows time date person-I am in pain She tells me that she takes gabapentin  and Tylenol  10-clarifies that the Tylenol  is actually Percocet She has not really eaten today  Objective + exam Vitals:   04/26/24 1339 04/26/24 2138 04/27/24 0641 04/27/24 1212  BP: 129/67 128/60 (!) 157/85 (!) 162/81  Pulse: 90 92 88 78  Resp: 16 15 16 18   Temp: (!) 97.5 F (36.4 C) 99.2 F (37.3 C) 98.1 F (36.7 C) 98.2 F (36.8 C)  TempSrc:  Oral Oral Oral  SpO2: 97% 100% 100% 98%  Weight:      Height:       Filed Weights   04/25/24 1306  Weight: 104.3 kg     Examination: EOMI NCAT no focal deficit no icterus no pallor thick neck Mallampati 4 slow responses AAO X4 neck soft supple thick--S1-S2 no murmur  no rub no gallop chest is clear no wheeze rales rhonchi abdomen soft no rebound no guarding no lower extremity edema     Scheduled Meds:  aspirin  EC  81 mg Oral Daily   atorvastatin   80 mg Oral Daily   enoxaparin  (LOVENOX ) injection  40 mg Subcutaneous Q24H   insulin  aspart  0-20 Units Subcutaneous TID WC   insulin  aspart  0-5 Units Subcutaneous QHS   insulin  aspart  6 Units Subcutaneous TID WC   insulin   glargine-yfgn  10 Units Subcutaneous BID   linaclotide   145 mcg Oral Daily   traZODone   50 mg Oral QHS   Continuous Infusions:  cefTRIAXone  (ROCEPHIN )  IV 1 g (04/26/24 2035)   acetaminophen  **OR** acetaminophen , ondansetron  **OR** ondansetron  (ZOFRAN ) IV  Time 50  Colen Grimes, MD  [facility]

## 2024-04-28 ENCOUNTER — Other Ambulatory Visit (HOSPITAL_COMMUNITY): Payer: Self-pay

## 2024-04-28 DIAGNOSIS — G9341 Metabolic encephalopathy: Secondary | ICD-10-CM | POA: Diagnosis not present

## 2024-04-28 LAB — GLUCOSE, CAPILLARY
Glucose-Capillary: 150 mg/dL — ABNORMAL HIGH (ref 70–99)
Glucose-Capillary: 283 mg/dL — ABNORMAL HIGH (ref 70–99)

## 2024-04-28 MED ORDER — AMLODIPINE BESYLATE 10 MG PO TABS
10.0000 mg | ORAL_TABLET | Freq: Every day | ORAL | 1 refills | Status: AC
  Filled 2024-04-28: qty 30, 30d supply, fill #0

## 2024-04-28 MED ORDER — LANTUS SOLOSTAR 100 UNIT/ML ~~LOC~~ SOPN: 15.0000 [IU] | PEN_INJECTOR | Freq: Two times a day (BID) | SUBCUTANEOUS | Status: AC

## 2024-04-28 MED ORDER — BUPROPION HCL ER (SR) 100 MG PO TB12
100.0000 mg | ORAL_TABLET | Freq: Every day | ORAL | 0 refills | Status: AC
  Filled 2024-04-28: qty 30, 30d supply, fill #0

## 2024-04-28 MED ORDER — GABAPENTIN 600 MG PO TABS: 300.0000 mg | ORAL_TABLET | Freq: Two times a day (BID) | ORAL | Status: AC

## 2024-04-28 NOTE — TOC Transition Note (Signed)
 Transition of Care [facility]) - Discharge Note   Patient Details  Name: Miranda [name] MRN: [id number] Date of Birth: 1955-09-03  [facility]) CM/SW Contact:  NORMAN ASPEN, LCSW Phone Number: 04/28/2024, 9:44 AM   Clinical Narrative:     Pt medically cleared for dc home today and orders placed for HHPT and DME.  Met with pt who is aware and agreeable with no agency preferences.  HHPT secured with [facility].  Rollator order placed with [facility] for delivery to room prior to dc today.  Pt states that she will call her contact, Will, to coordinate dc transportation.  No further IP CM needs.  Final next level of care: Home w Home Health Services Barriers to Discharge: No Barriers Identified   Patient Goals and CMS Choice Patient states their goals for this hospitalization and ongoing recovery are:: return home          Discharge Placement                       Discharge Plan and Services Additional resources added to the After Visit Summary for                  DME Arranged: Walker rolling with seat DME Agency: [facility] Date DME Agency Contacted: 04/28/24 Time DME Agency Contacted: [phone] Representative spoke with at DME Agency: [facility] HH Arranged: PT HH Agency: [facility] Date [facility] Agency Contacted: 04/28/24 Time HH Agency Contacted: [phone] Representative spoke with at [facility] Agency: accepted on EPIC hub  Social Drivers of Health (SDOH) Interventions SDOH Screenings   Food Insecurity: No Food Insecurity (04/26/2024)  Housing: Low Risk (04/26/2024)  Transportation Needs: No Transportation Needs (04/26/2024)  Utilities: Not At Risk (04/26/2024)  Social Connections: Socially Isolated (04/26/2024)  Tobacco Use: High Risk (04/25/2024)     Readmission Risk Interventions    04/28/2024    9:43 AM  Readmission Risk Prevention Plan  Post Dischage Appt Complete  Medication Screening Complete  Transportation Screening Complete

## 2024-04-28 NOTE — Discharge Summary (Signed)
 Physician Discharge Summary  Miranda [name] FMW:[id number] DOB: 15-Feb-1956 DOA: 04/25/2024  PCP: Shelda Atlas, MD  Admit date: 04/25/2024 Discharge date: 04/28/2024  Time spent: 36 minutes  Recommendations for Outpatient Follow-up:  Recommend hydration and adjustment of insulin  in the outpatient setting Careful monitoring of labs in the outpatient with Chem-12 CBC CK Careful up titration if necessary as an outpatient with regards to medications with potential for sedation such as oxycodone  gabapentin  Flexeril  etc.-patient was on 2 different medications Lyrica  and gabapentin  which may have compounded metabolic encephalopathy on admission and should have 1 person review the meds on follow-up  Discharge Diagnoses:  MAIN problem for hospitalization   Acute metabolic encephalopathy in the setting of volume depletion possible UTI and polypharmacy  Please see below for itemized issues addressed in HOpsital- refer to other progress notes for clarity if needed  Discharge Condition: Improved  Diet recommendation: Heart healthy diabetic  Filed Weights   04/25/24 1306  Weight: 104.3 kg    History of present illness:    69 year old female NSTEMI with PCI to LAD D1 and stenting 04/13/23 supposed to previously be on 1 year DAPT--- had a previous NSTEMI 2017 additionally with stent placement previous pancreatitis 2019 HFpEF Previous tobacco abuse Prior acute left lenticulostriate CVA 2014   04/25/2022 presented from home with a fall found on the floor had nothing to eat besides cranberry juice and oral meds unable to get up she also seemed delusional --saw  numbers on her skin--- no illicit drug use--- given a bolus of fluids-CT scan cervical spine reversal of cervical lordosis-CT head no acute intracranial abnormality-CT lumbar no CT evidence of traumatic injury in lumbar spine with degenerative subluxation L4-5 and degenerative changes Coincidentally 3.1 X2.4 indeterminate soft tissue  mass possibly thyroid isthmus was found She had mildly elevated CK and was hyperglycemic   She was admitted for acute metabolic encephalopathy presumed secondary to UTI-noted to also have severe poly pharmacy in a setting of taking both Lyrica  and Gabapentin  as well as high-dose bupropion       Assessment  & Plan :      Toxic metabolic encephalopathy on arrival Polypharmacy versus infection THC was positive on drug screen Unclear if withdrawal from various meds such as bupropion  and gabapentin  etc. versus others Multiple medications were noted on patient's list to have potential for sedation patient had therapeutic duplication with both Lyrica  and gabapentin  Discharge meds were adjusted as per below-to take only bupropion  100, gabapentin  300 twice daily, oxycodone  10/325 as well as trazodone  50 at bedtime Quit marijuana Urine analysis leukocytes but negative nitrites urine culture never obtained-completed 3 doses of Rocephin  1 g--- unfortunately no urine culture obtained and she did not show septic physiology  NSTEMI HFpEF HTN Amlodipine  5, Coreg  12.5 twice daily, losartan  25 all held initially because sleepy-holding clonidine  0.1 going forward as risk of rebound hypertension Meds were adjusted and patient was resumed on only Coreg  12.5 twice daily, amlodipine  10 and Lasix  as as needed Will resume aspirin  81-is off of Brilinta  and other antiplatelet anginal agents.   Previous CVA 2014 Administer aspirin  as above   Diabetes mellitus type 2 with neuropathy Jardiance  25 held Lantus  60 twice daily metformin  1000 twice daily at home--- apparently was not taking the metformin ? CBGs less than 250 overall Meds were adjusted going forward and patient was started back at discharge on Jardiance  25 (I have low suspicion the patient had a lower urinary tract infection), Lantus  15 twice daily May need escalation as an  outpatient    Discharge Exam: Vitals:   04/27/24 2145 04/28/24 0534  BP: (!)  153/75 (!) 159/80  Pulse: 64 79  Resp: 15 14  Temp: (!) 97.4 F (36.3 C) 98.1 F (36.7 C)  SpO2: 99% 93%    Subj on day of d/c   Awake coherent no distress looks fair feels well no nausea no vomiting no chest pain no nausea A little bit sleepy but overall seems more conversant  General Exam on discharge  EOMI NCAT no focal deficit no icterus no pallor no wheeze no rales CTAB S1-S2 no murmur no rub no gallop Abdomen soft no rebound no guarding Neurologically intact no focal deficit  Discharge Instructions   Discharge Instructions     Discharge instructions   Complete by: As directed    Look at medications carefully---Suggest only using medications as directed as you came in with some Confusion that was probably related to both infection as well as high-dose pain medications and dehydration-please follow-up with your primary physician in 1 week to get labs and for them to review your medications going forward as well We will order home health to come out and try and order a rolling walker to make sure that you have the equipment you need-this may not be approved by insurance so be aware that Watch your blood sugars carefully as some of your medications were adjusted downwards because you were not eating as much as you were probably at home and you may need to increase the doses and continue to use your Dexcom G7   Increase activity slowly   Complete by: As directed       Allergies as of 04/28/2024   No Known Allergies      Medication List     STOP taking these medications    buPROPion  300 MG 24 hr tablet Commonly known as: WELLBUTRIN  XL Replaced by: buPROPion  ER 100 MG 12 hr tablet   buPROPion  75 MG tablet Commonly known as: WELLBUTRIN    cilostazol  50 MG tablet Commonly known as: PLETAL    cloNIDine  0.1 MG tablet Commonly known as: CATAPRES    cyclobenzaprine  10 MG tablet Commonly known as: FLEXERIL    losartan  25 MG tablet Commonly known as: COZAAR     metFORMIN  1000 MG tablet Commonly known as: GLUCOPHAGE    Movantik  25 MG Tabs tablet Generic drug: naloxegol  oxalate   potassium chloride  10 MEQ tablet Commonly known as: KLOR-CON    pregabalin  100 MG capsule Commonly known as: Lyrica    pregabalin  150 MG capsule Commonly known as: LYRICA    pregabalin  50 MG capsule Commonly known as: Lyrica    ticagrelor  90 MG Tabs tablet Commonly known as: BRILINTA        TAKE these medications    Accu-Chek Guide Test test strip Generic drug: glucose blood 1 each 3 (three) times daily.   Accu-Chek Softclix Lancets lancets 3 (three) times daily.   albuterol  108 (90 Base) MCG/ACT inhaler Commonly known as: VENTOLIN  HFA Inhale 2 puffs into the lungs every 6 (six) hours as needed for wheezing or shortness of breath.   amLODipine  10 MG tablet Commonly known as: NORVASC  Take 1 tablet (10 mg total) by mouth daily. What changed: Another medication with the same name was removed. Continue taking this medication, and follow the directions you see here.   aspirin  EC 81 MG tablet Take 1 tablet (81 mg total) by mouth daily. Swallow whole.   atorvastatin  80 MG tablet Commonly known as: LIPITOR  TAKE 1 TABLET BY MOUTH DAILY AT 6  PM.   B-12 1000 MCG Caps Take 1,000 mcg by mouth daily.   buPROPion  ER 100 MG 12 hr tablet Commonly known as: Wellbutrin  SR Take 1 tablet (100 mg total) by mouth daily. Replaces: buPROPion  300 MG 24 hr tablet   carvedilol  12.5 MG tablet Commonly known as: COREG  Take 1 tablet (12.5 mg total) by mouth 2 (two) times daily with a meal.   Dexcom G7 Sensor Misc as directed.   diclofenac  sodium 1 % Gel Commonly known as: VOLTAREN  Apply 4 g topically 4 (four) times daily. What changed:  when to take this reasons to take this   fluticasone 50 MCG/ACT nasal spray Commonly known as: FLONASE Place 2 sprays into both nostrils daily.   furosemide  20 MG tablet Commonly known as: LASIX  Take 1 tablet (20 mg total) by  mouth daily. What changed:  when to take this reasons to take this   gabapentin  600 MG tablet Commonly known as: NEURONTIN  Take 0.5 tablets (300 mg total) by mouth 2 (two) times daily. What changed: how much to take   Jardiance  25 MG Tabs tablet Generic drug: empagliflozin  Take 25 mg by mouth daily.   ketoconazole  2 % cream Commonly known as: NIZORAL  APPLY 1 APPLICATION TOPICALLY DAILY   Lantus  SoloStar 100 UNIT/ML Solostar Pen Generic drug: insulin  glargine Inject 15 Units into the skin 2 (two) times daily. What changed:  how much to take when to take this   Linzess  145 MCG Caps capsule Generic drug: linaclotide  Take 145 mcg by mouth daily.   naloxone  4 MG/0.1ML Liqd nasal spray kit Commonly known as: NARCAN  Place 0.4 mg into the nose as needed (respiratory depression from opioid overdose).   nitroGLYCERIN  0.4 MG SL tablet Commonly known as: NITROSTAT  Place 1 tablet (0.4 mg total) under the tongue every 5 (five) minutes x 3 doses as needed for chest pain.   oxyCODONE -acetaminophen  10-325 MG tablet Commonly known as: PERCOCET Take 1 tablet by mouth every 6 (six) hours as needed for pain.   polyethylene glycol 17 g packet Commonly known as: MIRALAX  / GLYCOLAX  Take 17 g by mouth daily as needed. What changed: reasons to take this   traZODone  50 MG tablet Commonly known as: DESYREL  Take 50 mg by mouth at bedtime as needed for sleep.               Durable Medical Equipment  (From admission, onward)           Start     Ordered   04/28/24 0816  DME Vannie  Once       Question Answer Comment  Walker: With 5 Inch Wheels   Patient needs a walker to treat with the following condition Fall      04/28/24 0816           Allergies[1]    The results of significant diagnostics from this hospitalization (including imaging, microbiology, ancillary and laboratory) are listed below for reference.    Significant Diagnostic Studies: DG Knee Right  Port Result Date: 04/25/2024 CLINICAL DATA:  Trauma EXAM: PORTABLE RIGHT KNEE - 1-2 VIEW COMPARISON:  07/14/2022 FINDINGS: No fracture or malalignment. Tricompartment arthritis of the knee with moderate severe medial and patellofemoral joint space degenerative change. No significant knee effusion IMPRESSION: No acute osseous abnormality. Tricompartment arthritis. Electronically Signed   By: Luke Bun M.D.   On: 04/25/2024 15:50   CT Cervical Spine Wo Contrast Result Date: 04/25/2024 CLINICAL DATA:  Trauma EXAM: CT CERVICAL SPINE WITHOUT CONTRAST TECHNIQUE:  Multidetector CT imaging of the cervical spine was performed without intravenous contrast. Multiplanar CT image reconstructions were also generated. RADIATION DOSE REDUCTION: This exam was performed according to the departmental dose-optimization program which includes automated exposure control, adjustment of the mA and/or kV according to patient size and/or use of iterative reconstruction technique. COMPARISON:  None Available. FINDINGS: Alignment: Reversal of cervical lordosis. No subluxation. Facet alignment is within normal limits. Skull base and vertebrae: No acute fracture. No primary bone lesion or focal pathologic process. Soft tissues and spinal canal: No prevertebral fluid or swelling. No visible canal hematoma. Disc levels: Moderate severe degenerative changes C4 through C7 with diffuse disc space narrowing and osteophytes. Multilevel facet degenerative changes and foraminal narrowing. Upper chest: Mild apical emphysema. Other: Suspicion of a soft tissue mass within the midline anterior neck soft tissues measuring 3.1 x 2.4 cm on series 9, image 71, indeterminate origin, possibly thyroid isthmus. IMPRESSION: 1. Reversal of cervical lordosis with degenerative changes. No acute osseous abnormality. 2. Suspicion of a soft tissue mass within the midline anterior neck soft tissues measuring 3.1 x 2.4 cm, indeterminate origin, possibly thyroid  isthmus. Recommend non emergent thyroid US  for initial evaluation with possible MRI follow-up depending on US  results. 3. Emphysema. Emphysema (ICD10-J43.9). Electronically Signed   By: Luke Bun M.D.   On: 04/25/2024 15:43   CT Lumbar Spine Wo Contrast Result Date: 04/25/2024 EXAM: CT OF THE LUMBAR SPINE WITHOUT CONTRAST 04/25/2024 02:34:55 PM TECHNIQUE: CT of the lumbar spine was performed without the administration of intravenous contrast. Multiplanar reformatted images are provided for review. Automated exposure control, iterative reconstruction, and/or weight based adjustment of the mA/kV was utilized to reduce the radiation dose to as low as reasonably achievable. COMPARISON: MRI lumbar spine dated 06/15/2023. CLINICAL HISTORY: Fall yesterday, found on floor, back pain. FINDINGS: BONES AND ALIGNMENT: Normal vertebral body heights. No acute fracture or suspicious bone lesion. Normal alignment, except for 2 mm degenerative anterior subluxation at L4-L5. The lowest lumbar type non-weightbearing vertebra is labeled as L5. DEGENERATIVE FINDINGS: T12-L1: Unremarkable. L1-L2: No impingement. Mild disc bulge. L2-L3: No impingement. Mild disc bulge. L3-L4: No impingement. Mild disc bulge and mild degenerative facet arthropathy. L4-L5: No impingement. Bilateral degenerative facet arthropathy and mild disc bulge. 2 mm degenerative anterior subluxation. L5-S1: No impingement. Degenerative facet arthropathy and mild disc bulge. SOFT TISSUES: Systemic atherosclerosis is present, including the aorta and iliac arteries. Atheromatous plaque noted proximally in the superior mesenteric artery and celiac artery, patency not assessed on today's noncontrast CT of the lumbar spine. No acute abnormality. IMPRESSION: 1. No CT evidence of acute traumatic injury in the lumbar spine. 2. 2 mm degenerative anterior subluxation at L4-5. 3. Degenerative findings without overt impingement at L1-2, L2-3, L3-4, L4-5, L5-S1. 4. Chronic  systemic atherosclerosis involving the aorta and iliac arteries, with atheromatous plaque in the proximal superior mesenteric and celiac arteries. Electronically signed by: Ryan Salvage MD MD 04/25/2024 03:28 PM EST RP Workstation: [id number]   CT Head Wo Contrast Result Date: 04/25/2024 CLINICAL DATA:  Head trauma found on floor EXAM: CT HEAD WITHOUT CONTRAST TECHNIQUE: Contiguous axial images were obtained from the base of the skull through the vertex without intravenous contrast. RADIATION DOSE REDUCTION: This exam was performed according to the departmental dose-optimization program which includes automated exposure control, adjustment of the mA and/or kV according to patient size and/or use of iterative reconstruction technique. COMPARISON:  CT and MRI brain 08/27/2022 FINDINGS: Brain: No acute territorial infarction, hemorrhage or intracranial mass. Chronic small  vessel ischemic changes of the white matter. Small chronic infarcts within the basal ganglia and corona radiata. The ventricles are nonenlarged Vascular: No hyperdense vessels.  Carotid vascular calcification Skull: Normal. Negative for fracture or focal lesion. Sinuses/Orbits: No acute finding. Other: None IMPRESSION: 1. No CT evidence for acute intracranial abnormality. 2. Chronic small vessel ischemic changes of the white matter. Electronically Signed   By: Luke Bun M.D.   On: 04/25/2024 15:26   DG Chest Portable 1 View Result Date: 04/25/2024 CLINICAL DATA:  Found on floor EXAM: PORTABLE CHEST 1 VIEW COMPARISON:  09/28/2023 FINDINGS: The heart size and mediastinal contours are within normal limits. Both lungs are clear. The visualized skeletal structures are unremarkable. Aortic atherosclerosis. IMPRESSION: No active disease. Electronically Signed   By: Luke Bun M.D.   On: 04/25/2024 15:22    Microbiology: No results found for this or any previous visit (from the past 240 hours).   Labs: Basic Metabolic Panel: Recent  Labs  Lab 04/25/24 1331 04/25/24 1948 04/26/24 1246 04/27/24 0335  NA 144  --  142 146*  K 3.8  --  3.5 3.5  CL 102  --  108 112*  CO2 22  --  25 21*  GLUCOSE 220*  --  137* 155*  BUN 29*  --  35* 23  CREATININE 0.89 1.06* 0.93 0.73  CALCIUM  10.6*  --  9.4 9.1  MG  --   --   --  2.3   Liver Function Tests: Recent Labs  Lab 04/26/24 1246  AST 26  ALT 16  ALKPHOS 149*  BILITOT 0.4  PROT 6.4*  ALBUMIN 3.6   No results for input(s): LIPASE, AMYLASE in the last 168 hours. No results for input(s): AMMONIA in the last 168 hours. CBC: Recent Labs  Lab 04/25/24 1331 04/25/24 1948 04/26/24 1246 04/27/24 0335  WBC 10.1 8.9 8.3 7.2  NEUTROABS 6.8  --   --   --   HGB 15.5* 13.9 13.5 13.9  HCT 52.5* 46.4* 44.4 45.0  MCV 92.8 91.0 90.1 89.8  PLT 377 342 287 285   Cardiac Enzymes: Recent Labs  Lab 04/25/24 1331  CKTOTAL 350*   BNP: BNP (last 3 results) Recent Labs    09/28/23 1421  BNP 257.2*    ProBNP (last 3 results) No results for input(s): PROBNP in the last 8760 hours.  CBG: Recent Labs  Lab 04/27/24 0735 04/27/24 1150 04/27/24 1645 04/27/24 2142 04/28/24 0720  GLUCAP 295* 89 171* 210* 150*    Signed:  Colen Grimes MD   [facility] 04/28/2024, 8:16 AM      [1] No Known Allergies

## 2024-04-28 NOTE — Progress Notes (Signed)
 Discharge medications delivered to patient at the bedside in a secure bag.

## 2024-04-28 NOTE — Progress Notes (Signed)
 Nurse reviewed discharge instructions with pt. Pt verbalized understanding of discharge instructions, follow up appointments and new medications.  Rollator delivered to pt room prior to discharge.

## 2024-04-28 NOTE — Evaluation (Signed)
 Occupational Therapy Evaluation Patient Details Name: Miranda [name] MRN: [id number] DOB: 12-20-1955 Today's Date: 04/28/2024   History of Present Illness   69 y.o. female  who presents via EMS from home where home health nurse visited patient and found her on the floor. admitted with acute metabolic encephalopathy as a result of UTI.  Patient also with  mildly elevated CK indicating early rhabdomyolysis. CT no evidence of traumatic injury in lumbar spine with degenerative subluxation L4-5 and degenerative changesPMH:  diabetes, hypertension, hypoglycemia, peripheral vascular disease, coronary artery disease, asthma, anxiety disorder, GERD,  chronic lower back and right knee pain     Clinical Impressions PTA, patient lives at home alone in apt with steps to enter with pca assist, amb with walker and showers with DME (shower seat) but has limited activity level- orders groceries for delivery and has friend for transport assist.  Currently, patient presents with deficits outlined below (see OT Problem List for details) most significantly pain, decreased activity tolerance and balance limiting BADL's (CGA LB) and functional mobility (CGA) performance. Recommending HHOT services and transfer tub bench for bathing. Patient requires continued Acute care hospital level OT services to progress safety and functional performance and allow for discharge.       If plan is discharge home, recommend the following:   A little help with walking and/or transfers;A little help with bathing/dressing/bathroom;Assistance with cooking/housework;Assist for transportation;Help with stairs or ramp for entrance     Functional Status Assessment   Patient has had a recent decline in their functional status and demonstrates the ability to make significant improvements in function in a reasonable and predictable amount of time.     Equipment Recommendations   Tub/shower bench       Precautions/Restrictions   Precautions Precautions: Fall Restrictions Weight Bearing Restrictions Per Provider Order: No     Mobility Bed Mobility Overal bed mobility:  (patient up in recliner and remained post session)                  Transfers Overall transfer level: Needs assistance Equipment used: Rolling walker (2 wheels) Transfers: Sit to/from Stand, Bed to chair/wheelchair/BSC Sit to Stand: Contact guard assist     Step pivot transfers: Contact guard assist     General transfer comment: min cues for hand placement and increased trials to power up from lower surfaces      Balance Overall balance assessment: Needs assistance Sitting-balance support: Feet supported, No upper extremity supported Sitting balance-Leahy Scale: Fair Sitting balance - Comments: not challenged   Standing balance support: During functional activity, Reliant on assistive device for balance Standing balance-Leahy Scale: Poor                             ADL either performed or assessed with clinical judgement   ADL Overall ADL's : Needs assistance/impaired Eating/Feeding: Independent   Grooming: Wash/dry hands;Wash/dry face;Oral care;Applying deodorant;Sitting;Modified independent   Upper Body Bathing: Modified independent;Sitting   Lower Body Bathing: Contact guard assist;Sit to/from stand;With adaptive equipment   Upper Body Dressing : Modified independent;Sitting   Lower Body Dressing: Minimal assistance;With adaptive equipment;Sit to/from stand   Toilet Transfer: Contact guard assist;Regular Toilet;Rolling walker (2 wheels) Toilet Transfer Details (indicate cue type and reason): placed BSC over for ease for STS Toileting- Clothing Manipulation and Hygiene: Modified independent;Sitting/lateral lean     Tub/Shower Transfer Details (indicate cue type and reason): reports she has requested from county and insurance a  TTB, OT recommends Functional mobility  during ADLs: Contact guard assist;Rolling walker (2 wheels) General ADL Comments: decreased reach to LE's, has AD at home and HHA     Vision Baseline Vision/History: 0 No visual deficits              Pertinent Vitals/Pain Pain Assessment Pain Assessment: Faces Faces Pain Scale: Hurts even more Pain Location: back, knees, hips Pain Descriptors / Indicators: Grimacing, Sore Pain Intervention(s): Limited activity within patient's tolerance, Monitored during session, Repositioned, Premedicated before session     Extremity/Trunk Assessment Upper Extremity Assessment Upper Extremity Assessment: Overall WFL for tasks assessed;Right hand dominant   Lower Extremity Assessment Lower Extremity Assessment: Defer to PT evaluation   Cervical / Trunk Assessment Cervical / Trunk Assessment: Normal   Communication Communication Communication: No apparent difficulties (sleepy, slow to repond at times, more interactive EOS)   Cognition Arousal: Alert Behavior During Therapy: Flat affect Cognition: No apparent impairments                               Following commands: Intact       Cueing  General Comments   Cueing Techniques: Verbal cues;Tactile cues  no skin issues, VSS, decreased overall activity tolerance           Home Living Family/patient expects to be discharged to:: Private residence Living Arrangements: Alone   Type of Home: Apartment Home Access: Stairs to enter [employer] of Steps: flight  to 2nd floor apt   Home Layout: One level     Bathroom Shower/Tub: Tub/shower unit;Curtain         Home Equipment: Rollator (4 wheels);Shower seat;Adaptive equipment Adaptive Equipment: Reacher Additional Comments: has an aide to assist in the home      Prior Functioning/Environment Prior Level of Function : Independent/Modified Independent;Driving             Mobility Comments: pt reports sedentary lifestyle-stays in bed a lot,  orders groceries/food for delivery. states she drives to appointments. ambulates with rollator ADLs Comments: has a shower seat but is looking at a TTB under [facility]    OT Problem List: Decreased activity tolerance;Impaired balance (sitting and/or standing);Pain   OT Treatment/Interventions: Self-care/ADL training;Therapeutic exercise;Neuromuscular education;Energy conservation;DME and/or AE instruction;Therapeutic activities;Patient/family education;Balance training      OT Goals(Current goals can be found in the care plan section)   Acute Rehab OT Goals Patient Stated Goal: t feel stronger OT Goal Formulation: With patient Time For Goal Achievement: 05/12/24 Potential to Achieve Goals: Fair ADL Goals Pt Will Perform Lower Body Bathing: with modified independence;sit to/from stand;with adaptive equipment Pt Will Perform Lower Body Dressing: with contact guard assist;sit to/from stand;with adaptive equipment Pt Will Transfer to Toilet: with supervision;ambulating;regular height toilet Pt Will Perform Toileting - Clothing Manipulation and hygiene: with modified independence;sitting/lateral leans Pt Will Perform Tub/Shower Transfer: with contact guard assist;Tub transfer;shower seat;ambulating;rolling walker Pt/caregiver will Perform Home Exercise Program: Independently;With written HEP provided   OT Frequency:  Min 2X/week       AM-PAC OT 6 Clicks Daily Activity     Outcome Measure Help from another person eating meals?: None Help from another person taking care of personal grooming?: None Help from another person toileting, which includes using toliet, bedpan, or urinal?: A Little Help from another person bathing (including washing, rinsing, drying)?: A Little Help from another person to put on and taking off regular upper body clothing?: None Help from another  person to put on and taking off regular lower body clothing?: A Little 6 Click Score: 21   End of Session Nurse  Communication: Mobility status  Activity Tolerance: Patient limited by fatigue;Patient limited by pain Patient left: in chair;with call bell/phone within reach;with chair alarm set  OT Visit Diagnosis: Unsteadiness on feet (R26.81);Pain Pain - part of body:  (back)                Time: 8890-8863 OT Time Calculation (min): 27 min Charges:  OT General Charges $OT Visit: 1 Visit OT Evaluation $OT Eval Low Complexity: 1 Low OT Treatments $Self Care/Home Management : 8-22 mins  Princesa Willig OT/L Acute Rehabilitation Department  [phone]  04/28/2024, 12:44 PM
